# Patient Record
Sex: Female | Born: 1970
Health system: Southern US, Community
[De-identification: ages and names within clinical notes are randomized; demographics above are authoritative.]

## PROBLEM LIST (undated history)

## (undated) DIAGNOSIS — K219 Gastro-esophageal reflux disease without esophagitis: Secondary | ICD-10-CM

## (undated) DIAGNOSIS — J45909 Unspecified asthma, uncomplicated: Secondary | ICD-10-CM

## (undated) DIAGNOSIS — F419 Anxiety disorder, unspecified: Secondary | ICD-10-CM

## (undated) DIAGNOSIS — R011 Cardiac murmur, unspecified: Secondary | ICD-10-CM

## (undated) DIAGNOSIS — F319 Bipolar disorder, unspecified: Secondary | ICD-10-CM

## (undated) DIAGNOSIS — D649 Anemia, unspecified: Secondary | ICD-10-CM

## (undated) DIAGNOSIS — F32A Depression, unspecified: Secondary | ICD-10-CM

## (undated) DIAGNOSIS — K449 Diaphragmatic hernia without obstruction or gangrene: Secondary | ICD-10-CM

## (undated) DIAGNOSIS — K649 Unspecified hemorrhoids: Secondary | ICD-10-CM

## (undated) DIAGNOSIS — K648 Other hemorrhoids: Secondary | ICD-10-CM

## (undated) DIAGNOSIS — S99929A Unspecified injury of unspecified foot, initial encounter: Secondary | ICD-10-CM

## (undated) DIAGNOSIS — F329 Major depressive disorder, single episode, unspecified: Secondary | ICD-10-CM

## (undated) DIAGNOSIS — I10 Essential (primary) hypertension: Secondary | ICD-10-CM

## (undated) DIAGNOSIS — G4719 Other hypersomnia: Secondary | ICD-10-CM

## (undated) HISTORY — DX: Major depressive disorder, single episode, unspecified: F32.9

## (undated) HISTORY — DX: Diaphragmatic hernia without obstruction or gangrene: K44.9

## (undated) HISTORY — DX: Other hypersomnia: G47.19

## (undated) HISTORY — PX: INGUINAL HERNIA REPAIR: SUR1180

## (undated) HISTORY — DX: Anxiety disorder, unspecified: F41.9

## (undated) HISTORY — DX: Gastro-esophageal reflux disease without esophagitis: K21.9

## (undated) HISTORY — DX: Unspecified injury of unspecified foot, initial encounter: S99.929A

## (undated) HISTORY — DX: Other hemorrhoids: K64.8

## (undated) HISTORY — PX: PARTIAL HYSTERECTOMY: SHX80

## (undated) HISTORY — DX: Unspecified hemorrhoids: K64.9

## (undated) HISTORY — PX: HEMORRHOID SURGERY: SHX153

## (undated) HISTORY — DX: Unspecified asthma, uncomplicated: J45.909

## (undated) HISTORY — DX: Essential (primary) hypertension: I10

## (undated) HISTORY — PX: UPPER GASTROINTESTINAL ENDOSCOPY: SHX188

## (undated) HISTORY — PX: ADENOIDECTOMY: SUR15

## (undated) HISTORY — PX: TONSILLECTOMY: SUR1361

## (undated) HISTORY — PX: TUBAL LIGATION: SHX77

## (undated) HISTORY — DX: Bipolar disorder, unspecified: F31.9

## (undated) HISTORY — DX: Depression, unspecified: F32.A

## (undated) HISTORY — PX: ABDOMINAL HYSTERECTOMY: SHX81

---

## 1998-06-06 ENCOUNTER — Emergency Department (HOSPITAL_COMMUNITY): Admission: EM | Admit: 1998-06-06 | Discharge: 1998-06-06 | Payer: Self-pay | Admitting: Emergency Medicine

## 1998-09-19 ENCOUNTER — Encounter: Admission: RE | Admit: 1998-09-19 | Discharge: 1998-09-19 | Payer: Self-pay | Admitting: Internal Medicine

## 1998-10-12 ENCOUNTER — Encounter: Admission: RE | Admit: 1998-10-12 | Discharge: 1998-10-12 | Payer: Self-pay | Admitting: Obstetrics

## 1998-10-26 ENCOUNTER — Ambulatory Visit (HOSPITAL_COMMUNITY): Admission: RE | Admit: 1998-10-26 | Discharge: 1998-10-26 | Payer: Self-pay | Admitting: Obstetrics

## 1998-10-26 ENCOUNTER — Encounter: Admission: RE | Admit: 1998-10-26 | Discharge: 1998-10-26 | Payer: Self-pay | Admitting: Obstetrics

## 1998-12-28 ENCOUNTER — Encounter: Admission: RE | Admit: 1998-12-28 | Discharge: 1998-12-28 | Payer: Self-pay | Admitting: Obstetrics

## 1999-01-19 ENCOUNTER — Encounter: Admission: RE | Admit: 1999-01-19 | Discharge: 1999-01-19 | Payer: Self-pay | Admitting: Obstetrics & Gynecology

## 1999-03-02 ENCOUNTER — Encounter: Admission: RE | Admit: 1999-03-02 | Discharge: 1999-03-02 | Payer: Self-pay | Admitting: Obstetrics

## 1999-03-05 ENCOUNTER — Inpatient Hospital Stay (HOSPITAL_COMMUNITY): Admission: AD | Admit: 1999-03-05 | Discharge: 1999-03-05 | Payer: Self-pay | Admitting: Obstetrics & Gynecology

## 1999-04-05 ENCOUNTER — Inpatient Hospital Stay (HOSPITAL_COMMUNITY): Admission: RE | Admit: 1999-04-05 | Discharge: 1999-04-05 | Payer: Self-pay | Admitting: *Deleted

## 1999-05-15 ENCOUNTER — Encounter: Admission: RE | Admit: 1999-05-15 | Discharge: 1999-05-15 | Payer: Self-pay | Admitting: Obstetrics & Gynecology

## 1999-07-03 ENCOUNTER — Encounter: Admission: RE | Admit: 1999-07-03 | Discharge: 1999-07-03 | Payer: Self-pay | Admitting: Obstetrics & Gynecology

## 1999-09-05 ENCOUNTER — Encounter: Admission: RE | Admit: 1999-09-05 | Discharge: 1999-09-05 | Payer: Self-pay | Admitting: Hematology and Oncology

## 1999-09-05 ENCOUNTER — Encounter: Payer: Self-pay | Admitting: Internal Medicine

## 1999-09-05 ENCOUNTER — Ambulatory Visit (HOSPITAL_COMMUNITY): Admission: RE | Admit: 1999-09-05 | Discharge: 1999-09-05 | Payer: Self-pay | Admitting: Internal Medicine

## 1999-10-31 ENCOUNTER — Emergency Department (HOSPITAL_COMMUNITY): Admission: EM | Admit: 1999-10-31 | Discharge: 1999-10-31 | Payer: Self-pay | Admitting: Emergency Medicine

## 1999-11-29 ENCOUNTER — Encounter: Admission: RE | Admit: 1999-11-29 | Discharge: 1999-11-29 | Payer: Self-pay | Admitting: Obstetrics

## 2000-01-03 ENCOUNTER — Encounter: Admission: RE | Admit: 2000-01-03 | Discharge: 2000-01-03 | Payer: Self-pay | Admitting: Obstetrics

## 2000-01-17 ENCOUNTER — Encounter: Admission: RE | Admit: 2000-01-17 | Discharge: 2000-01-17 | Payer: Self-pay | Admitting: Obstetrics

## 2000-03-31 ENCOUNTER — Emergency Department (HOSPITAL_COMMUNITY): Admission: EM | Admit: 2000-03-31 | Discharge: 2000-03-31 | Payer: Self-pay | Admitting: Emergency Medicine

## 2000-05-15 ENCOUNTER — Encounter: Admission: RE | Admit: 2000-05-15 | Discharge: 2000-05-15 | Payer: Self-pay | Admitting: Internal Medicine

## 2000-07-01 ENCOUNTER — Encounter: Admission: RE | Admit: 2000-07-01 | Discharge: 2000-07-01 | Payer: Self-pay | Admitting: Internal Medicine

## 2000-08-22 ENCOUNTER — Encounter: Admission: RE | Admit: 2000-08-22 | Discharge: 2000-08-22 | Payer: Self-pay | Admitting: Internal Medicine

## 2000-08-29 ENCOUNTER — Encounter: Admission: RE | Admit: 2000-08-29 | Discharge: 2000-08-29 | Payer: Self-pay | Admitting: Internal Medicine

## 2000-11-19 ENCOUNTER — Inpatient Hospital Stay (HOSPITAL_COMMUNITY): Admission: AD | Admit: 2000-11-19 | Discharge: 2000-11-19 | Payer: Self-pay | Admitting: Obstetrics

## 2000-11-19 ENCOUNTER — Encounter: Payer: Self-pay | Admitting: Obstetrics

## 2000-12-11 ENCOUNTER — Encounter: Admission: RE | Admit: 2000-12-11 | Discharge: 2000-12-11 | Payer: Self-pay | Admitting: Obstetrics

## 2000-12-17 ENCOUNTER — Encounter: Admission: RE | Admit: 2000-12-17 | Discharge: 2000-12-17 | Payer: Self-pay | Admitting: Obstetrics & Gynecology

## 2000-12-24 ENCOUNTER — Encounter: Admission: RE | Admit: 2000-12-24 | Discharge: 2000-12-24 | Payer: Self-pay | Admitting: Obstetrics & Gynecology

## 2000-12-28 ENCOUNTER — Inpatient Hospital Stay (HOSPITAL_COMMUNITY): Admission: AD | Admit: 2000-12-28 | Discharge: 2000-12-28 | Payer: Self-pay | Admitting: Obstetrics & Gynecology

## 2000-12-31 ENCOUNTER — Encounter: Payer: Self-pay | Admitting: *Deleted

## 2000-12-31 ENCOUNTER — Encounter: Admission: RE | Admit: 2000-12-31 | Discharge: 2000-12-31 | Payer: Self-pay | Admitting: Obstetrics & Gynecology

## 2000-12-31 ENCOUNTER — Inpatient Hospital Stay (HOSPITAL_COMMUNITY): Admission: AD | Admit: 2000-12-31 | Discharge: 2000-12-31 | Payer: Self-pay | Admitting: *Deleted

## 2001-01-05 ENCOUNTER — Inpatient Hospital Stay (HOSPITAL_COMMUNITY): Admission: AD | Admit: 2001-01-05 | Discharge: 2001-01-05 | Payer: Self-pay | Admitting: Obstetrics

## 2001-01-07 ENCOUNTER — Encounter: Admission: RE | Admit: 2001-01-07 | Discharge: 2001-01-07 | Payer: Self-pay | Admitting: Obstetrics & Gynecology

## 2001-01-11 ENCOUNTER — Inpatient Hospital Stay (HOSPITAL_COMMUNITY): Admission: AD | Admit: 2001-01-11 | Discharge: 2001-01-11 | Payer: Self-pay | Admitting: Obstetrics

## 2001-01-14 ENCOUNTER — Encounter: Admission: RE | Admit: 2001-01-14 | Discharge: 2001-01-14 | Payer: Self-pay | Admitting: Obstetrics & Gynecology

## 2001-01-21 ENCOUNTER — Inpatient Hospital Stay (HOSPITAL_COMMUNITY): Admission: AD | Admit: 2001-01-21 | Discharge: 2001-01-21 | Payer: Self-pay | Admitting: *Deleted

## 2001-01-28 ENCOUNTER — Encounter: Admission: RE | Admit: 2001-01-28 | Discharge: 2001-01-28 | Payer: Self-pay | Admitting: Obstetrics & Gynecology

## 2001-02-04 ENCOUNTER — Encounter: Admission: RE | Admit: 2001-02-04 | Discharge: 2001-02-04 | Payer: Self-pay | Admitting: Obstetrics & Gynecology

## 2001-02-11 ENCOUNTER — Ambulatory Visit (HOSPITAL_COMMUNITY): Admission: RE | Admit: 2001-02-11 | Discharge: 2001-02-11 | Payer: Self-pay | Admitting: Obstetrics & Gynecology

## 2001-02-11 ENCOUNTER — Encounter: Payer: Self-pay | Admitting: Obstetrics & Gynecology

## 2001-02-11 ENCOUNTER — Encounter: Admission: RE | Admit: 2001-02-11 | Discharge: 2001-02-11 | Payer: Self-pay | Admitting: Obstetrics & Gynecology

## 2001-02-18 ENCOUNTER — Encounter: Admission: RE | Admit: 2001-02-18 | Discharge: 2001-02-18 | Payer: Self-pay | Admitting: Obstetrics & Gynecology

## 2001-02-25 ENCOUNTER — Encounter: Admission: RE | Admit: 2001-02-25 | Discharge: 2001-02-25 | Payer: Self-pay | Admitting: Obstetrics & Gynecology

## 2001-03-12 ENCOUNTER — Inpatient Hospital Stay (HOSPITAL_COMMUNITY): Admission: AD | Admit: 2001-03-12 | Discharge: 2001-03-12 | Payer: Self-pay | Admitting: Obstetrics & Gynecology

## 2001-03-12 ENCOUNTER — Encounter: Admission: RE | Admit: 2001-03-12 | Discharge: 2001-03-12 | Payer: Self-pay | Admitting: Obstetrics

## 2001-03-17 ENCOUNTER — Inpatient Hospital Stay (HOSPITAL_COMMUNITY): Admission: AD | Admit: 2001-03-17 | Discharge: 2001-03-17 | Payer: Self-pay | Admitting: Obstetrics

## 2001-03-24 ENCOUNTER — Ambulatory Visit (HOSPITAL_COMMUNITY): Admission: RE | Admit: 2001-03-24 | Discharge: 2001-03-24 | Payer: Self-pay | Admitting: Obstetrics & Gynecology

## 2001-03-25 ENCOUNTER — Encounter: Admission: RE | Admit: 2001-03-25 | Discharge: 2001-03-25 | Payer: Self-pay | Admitting: Obstetrics & Gynecology

## 2001-03-28 ENCOUNTER — Encounter: Payer: Self-pay | Admitting: *Deleted

## 2001-03-28 ENCOUNTER — Observation Stay (HOSPITAL_COMMUNITY): Admission: AD | Admit: 2001-03-28 | Discharge: 2001-03-29 | Payer: Self-pay | Admitting: *Deleted

## 2001-03-31 ENCOUNTER — Encounter: Payer: Self-pay | Admitting: *Deleted

## 2001-03-31 ENCOUNTER — Inpatient Hospital Stay (HOSPITAL_COMMUNITY): Admission: AD | Admit: 2001-03-31 | Discharge: 2001-04-02 | Payer: Self-pay | Admitting: *Deleted

## 2001-04-01 ENCOUNTER — Encounter: Admission: RE | Admit: 2001-04-01 | Discharge: 2001-04-01 | Payer: Self-pay | Admitting: Obstetrics & Gynecology

## 2001-04-08 ENCOUNTER — Encounter: Admission: RE | Admit: 2001-04-08 | Discharge: 2001-04-08 | Payer: Self-pay | Admitting: Obstetrics & Gynecology

## 2001-04-15 ENCOUNTER — Encounter: Admission: RE | Admit: 2001-04-15 | Discharge: 2001-04-15 | Payer: Self-pay | Admitting: Obstetrics & Gynecology

## 2001-04-15 ENCOUNTER — Ambulatory Visit (HOSPITAL_COMMUNITY): Admission: RE | Admit: 2001-04-15 | Discharge: 2001-04-15 | Payer: Self-pay | Admitting: *Deleted

## 2001-04-22 ENCOUNTER — Encounter: Admission: RE | Admit: 2001-04-22 | Discharge: 2001-04-22 | Payer: Self-pay | Admitting: Obstetrics & Gynecology

## 2001-04-30 ENCOUNTER — Inpatient Hospital Stay (HOSPITAL_COMMUNITY): Admission: AD | Admit: 2001-04-30 | Discharge: 2001-04-30 | Payer: Self-pay | Admitting: Obstetrics & Gynecology

## 2001-05-05 ENCOUNTER — Ambulatory Visit (HOSPITAL_COMMUNITY): Admission: RE | Admit: 2001-05-05 | Discharge: 2001-05-05 | Payer: Self-pay | Admitting: Obstetrics

## 2001-05-06 ENCOUNTER — Encounter: Admission: RE | Admit: 2001-05-06 | Discharge: 2001-05-06 | Payer: Self-pay | Admitting: Obstetrics & Gynecology

## 2001-05-13 ENCOUNTER — Inpatient Hospital Stay (HOSPITAL_COMMUNITY): Admission: AD | Admit: 2001-05-13 | Discharge: 2001-05-13 | Payer: Self-pay | Admitting: Obstetrics

## 2001-05-20 ENCOUNTER — Encounter: Admission: RE | Admit: 2001-05-20 | Discharge: 2001-05-20 | Payer: Self-pay | Admitting: Obstetrics & Gynecology

## 2001-06-02 ENCOUNTER — Encounter: Payer: Self-pay | Admitting: *Deleted

## 2001-06-02 ENCOUNTER — Observation Stay (HOSPITAL_COMMUNITY): Admission: AD | Admit: 2001-06-02 | Discharge: 2001-06-03 | Payer: Self-pay | Admitting: Obstetrics & Gynecology

## 2001-06-10 ENCOUNTER — Encounter: Admission: RE | Admit: 2001-06-10 | Discharge: 2001-06-10 | Payer: Self-pay | Admitting: Obstetrics & Gynecology

## 2001-06-11 ENCOUNTER — Inpatient Hospital Stay (HOSPITAL_COMMUNITY): Admission: AD | Admit: 2001-06-11 | Discharge: 2001-06-11 | Payer: Self-pay | Admitting: Obstetrics & Gynecology

## 2001-06-13 ENCOUNTER — Inpatient Hospital Stay (HOSPITAL_COMMUNITY): Admission: AD | Admit: 2001-06-13 | Discharge: 2001-06-16 | Payer: Self-pay | Admitting: *Deleted

## 2001-06-13 ENCOUNTER — Encounter (INDEPENDENT_AMBULATORY_CARE_PROVIDER_SITE_OTHER): Payer: Self-pay | Admitting: Specialist

## 2001-06-30 ENCOUNTER — Inpatient Hospital Stay (HOSPITAL_COMMUNITY): Admission: AD | Admit: 2001-06-30 | Discharge: 2001-06-30 | Payer: Self-pay | Admitting: *Deleted

## 2002-01-20 ENCOUNTER — Encounter (HOSPITAL_BASED_OUTPATIENT_CLINIC_OR_DEPARTMENT_OTHER): Payer: Self-pay | Admitting: General Surgery

## 2002-01-22 ENCOUNTER — Ambulatory Visit (HOSPITAL_COMMUNITY): Admission: RE | Admit: 2002-01-22 | Discharge: 2002-01-22 | Payer: Self-pay | Admitting: General Surgery

## 2002-01-22 ENCOUNTER — Encounter (INDEPENDENT_AMBULATORY_CARE_PROVIDER_SITE_OTHER): Payer: Self-pay | Admitting: Specialist

## 2002-05-26 ENCOUNTER — Encounter: Admission: RE | Admit: 2002-05-26 | Discharge: 2002-05-26 | Payer: Self-pay | Admitting: Internal Medicine

## 2002-06-20 ENCOUNTER — Emergency Department (HOSPITAL_COMMUNITY): Admission: EM | Admit: 2002-06-20 | Discharge: 2002-06-20 | Payer: Self-pay | Admitting: Emergency Medicine

## 2002-06-20 ENCOUNTER — Encounter: Payer: Self-pay | Admitting: Emergency Medicine

## 2002-08-05 ENCOUNTER — Inpatient Hospital Stay (HOSPITAL_COMMUNITY): Admission: AD | Admit: 2002-08-05 | Discharge: 2002-08-05 | Payer: Self-pay | Admitting: *Deleted

## 2002-12-14 ENCOUNTER — Encounter: Admission: RE | Admit: 2002-12-14 | Discharge: 2002-12-14 | Payer: Self-pay | Admitting: Internal Medicine

## 2002-12-14 ENCOUNTER — Ambulatory Visit (HOSPITAL_COMMUNITY): Admission: RE | Admit: 2002-12-14 | Discharge: 2002-12-14 | Payer: Self-pay | Admitting: Internal Medicine

## 2002-12-20 ENCOUNTER — Encounter: Admission: RE | Admit: 2002-12-20 | Discharge: 2002-12-20 | Payer: Self-pay | Admitting: Internal Medicine

## 2002-12-21 ENCOUNTER — Other Ambulatory Visit: Admission: RE | Admit: 2002-12-21 | Discharge: 2002-12-21 | Payer: Self-pay | Admitting: Obstetrics and Gynecology

## 2003-01-04 ENCOUNTER — Encounter (INDEPENDENT_AMBULATORY_CARE_PROVIDER_SITE_OTHER): Payer: Self-pay

## 2003-01-04 ENCOUNTER — Observation Stay (HOSPITAL_COMMUNITY): Admission: RE | Admit: 2003-01-04 | Discharge: 2003-01-05 | Payer: Self-pay | Admitting: Obstetrics and Gynecology

## 2003-01-14 ENCOUNTER — Inpatient Hospital Stay (HOSPITAL_COMMUNITY): Admission: AD | Admit: 2003-01-14 | Discharge: 2003-01-21 | Payer: Self-pay | Admitting: Obstetrics and Gynecology

## 2003-01-15 ENCOUNTER — Encounter: Payer: Self-pay | Admitting: Obstetrics and Gynecology

## 2003-01-16 ENCOUNTER — Encounter: Payer: Self-pay | Admitting: Urology

## 2003-04-05 ENCOUNTER — Encounter: Payer: Self-pay | Admitting: Internal Medicine

## 2003-04-05 ENCOUNTER — Encounter: Admission: RE | Admit: 2003-04-05 | Discharge: 2003-04-05 | Payer: Self-pay | Admitting: Internal Medicine

## 2003-04-05 ENCOUNTER — Ambulatory Visit (HOSPITAL_COMMUNITY): Admission: RE | Admit: 2003-04-05 | Discharge: 2003-04-05 | Payer: Self-pay | Admitting: Internal Medicine

## 2003-04-08 ENCOUNTER — Encounter: Admission: RE | Admit: 2003-04-08 | Discharge: 2003-04-08 | Payer: Self-pay | Admitting: Internal Medicine

## 2003-04-11 ENCOUNTER — Encounter: Admission: RE | Admit: 2003-04-11 | Discharge: 2003-04-11 | Payer: Self-pay | Admitting: Internal Medicine

## 2003-10-20 ENCOUNTER — Encounter: Admission: RE | Admit: 2003-10-20 | Discharge: 2003-10-20 | Payer: Self-pay | Admitting: Internal Medicine

## 2003-12-28 ENCOUNTER — Encounter: Admission: RE | Admit: 2003-12-28 | Discharge: 2003-12-28 | Payer: Self-pay | Admitting: Internal Medicine

## 2004-02-02 ENCOUNTER — Emergency Department (HOSPITAL_COMMUNITY): Admission: EM | Admit: 2004-02-02 | Discharge: 2004-02-02 | Payer: Self-pay | Admitting: Emergency Medicine

## 2004-02-08 ENCOUNTER — Encounter: Admission: RE | Admit: 2004-02-08 | Discharge: 2004-02-08 | Payer: Self-pay | Admitting: Internal Medicine

## 2004-02-15 ENCOUNTER — Encounter: Admission: RE | Admit: 2004-02-15 | Discharge: 2004-02-15 | Payer: Self-pay | Admitting: Internal Medicine

## 2004-02-21 ENCOUNTER — Encounter: Admission: RE | Admit: 2004-02-21 | Discharge: 2004-02-21 | Payer: Self-pay | Admitting: Internal Medicine

## 2004-02-27 ENCOUNTER — Emergency Department (HOSPITAL_COMMUNITY): Admission: AD | Admit: 2004-02-27 | Discharge: 2004-02-27 | Payer: Self-pay | Admitting: Family Medicine

## 2005-01-25 ENCOUNTER — Emergency Department (HOSPITAL_COMMUNITY): Admission: EM | Admit: 2005-01-25 | Discharge: 2005-01-25 | Payer: Self-pay | Admitting: Emergency Medicine

## 2005-10-15 ENCOUNTER — Emergency Department (HOSPITAL_COMMUNITY): Admission: EM | Admit: 2005-10-15 | Discharge: 2005-10-15 | Payer: Self-pay | Admitting: Emergency Medicine

## 2006-03-29 ENCOUNTER — Emergency Department (HOSPITAL_COMMUNITY): Admission: EM | Admit: 2006-03-29 | Discharge: 2006-03-29 | Payer: Self-pay | Admitting: Emergency Medicine

## 2006-08-31 ENCOUNTER — Emergency Department (HOSPITAL_COMMUNITY): Admission: EM | Admit: 2006-08-31 | Discharge: 2006-09-01 | Payer: Self-pay | Admitting: Emergency Medicine

## 2006-12-30 ENCOUNTER — Emergency Department (HOSPITAL_COMMUNITY): Admission: EM | Admit: 2006-12-30 | Discharge: 2006-12-30 | Payer: Self-pay | Admitting: Emergency Medicine

## 2007-03-14 ENCOUNTER — Emergency Department (HOSPITAL_COMMUNITY): Admission: EM | Admit: 2007-03-14 | Discharge: 2007-03-14 | Payer: Self-pay | Admitting: Emergency Medicine

## 2007-05-20 ENCOUNTER — Emergency Department (HOSPITAL_COMMUNITY): Admission: EM | Admit: 2007-05-20 | Discharge: 2007-05-20 | Payer: Self-pay | Admitting: Emergency Medicine

## 2007-08-22 ENCOUNTER — Emergency Department (HOSPITAL_COMMUNITY): Admission: EM | Admit: 2007-08-22 | Discharge: 2007-08-22 | Payer: Self-pay | Admitting: Emergency Medicine

## 2008-04-02 ENCOUNTER — Emergency Department (HOSPITAL_COMMUNITY): Admission: EM | Admit: 2008-04-02 | Discharge: 2008-04-02 | Payer: Self-pay | Admitting: Emergency Medicine

## 2008-04-18 ENCOUNTER — Ambulatory Visit: Payer: Self-pay | Admitting: Gastroenterology

## 2008-04-25 ENCOUNTER — Ambulatory Visit: Payer: Self-pay | Admitting: Gastroenterology

## 2008-04-25 ENCOUNTER — Encounter: Payer: Self-pay | Admitting: Gastroenterology

## 2008-04-27 ENCOUNTER — Encounter: Payer: Self-pay | Admitting: Gastroenterology

## 2009-09-17 ENCOUNTER — Emergency Department (HOSPITAL_COMMUNITY): Admission: EM | Admit: 2009-09-17 | Discharge: 2009-09-17 | Payer: Self-pay | Admitting: Emergency Medicine

## 2011-03-12 ENCOUNTER — Other Ambulatory Visit: Payer: Self-pay | Admitting: Oncology

## 2011-03-12 DIAGNOSIS — N6320 Unspecified lump in the left breast, unspecified quadrant: Secondary | ICD-10-CM

## 2011-03-15 ENCOUNTER — Ambulatory Visit
Admission: RE | Admit: 2011-03-15 | Discharge: 2011-03-15 | Disposition: A | Discharge status: Home / Self Care | Payer: Medicare Other | Source: Ambulatory Visit | Attending: Oncology | Admitting: Oncology

## 2011-03-15 ENCOUNTER — Other Ambulatory Visit: Payer: Self-pay | Admitting: Oncology

## 2011-03-15 DIAGNOSIS — N6320 Unspecified lump in the left breast, unspecified quadrant: Secondary | ICD-10-CM

## 2011-05-07 NOTE — Assessment & Plan Note (Signed)
[facility]                         GASTROENTEROLOGY OFFICE NOTE   CADE, OLBERDING                      MRN:          [id number]  DATE:04/18/2008                            DOB:          1971/06/21    REFERRED BY:  Sedonia Small NP.   REASON FOR CONSULTATION:  Nausea, chest discomfort, left lower quadrant  pain, constipation, and hematochezia.   HISTORY OF PRESENT ILLNESS:  Ms. Parlow is a 40 year old African-  American female that I previously evaluated in 2003.  She has a prior  history of a hemorrhoidectomy and was having lower abdominal pain,  rectal pain, and hematochezia.  She underwent colonoscopy in May 2003  which showed no abnormalities except some mild scar tissue in the distal  rectum consistent with her history of hemorrhoidectomy.  Over the past  several months she notes frequent nausea associated with substernal  burning, belching, and chest pressure.  She has had worsening problem  with constipation over the past few months associated with left lower  quadrant pain that radiates toward her back and intermittent small  volume hematochezia associated with bowel movements.  Her bleeding was  described as moderate for about four days, and then it discontinued.  She relates no change in stool caliber, weight loss, fevers, chills or  vomiting.   FAMILY HISTORY:  Her family history is negative for colon cancer, colon  polyps or inflammatory bowel disease.   PAST MEDICAL HISTORY:  Asthma  bsessive compulsive disorder  panic attacks  anxiety disorder  depression  uterine fibroids  hemorrhoids   PAST SURGICAL HISTORY:  Status post tonsillectomy  status post inguinal hernia repair at age 37  status post hemorrhoidectomy 2002  status post bilateral tubal ligation 2002   CURRENT MEDICATIONS:  listed on the chart; updated and reviewed.   MEDICATION ALLERGIES:  MORPHINE leading to itching.   Social History and Review of Systems  per the handwritten form.   PHYSICAL EXAMINATION:  GENERAL:  Anxious overweight female in no acute  distress, height 5 feet 5 inches, weight 197.6 pounds, blood pressure  106/72, pulse 72 and regular.  HEENT:  Anicteric sclerae, oropharynx clear.  CHEST:  Clear to auscultation bilaterally.  CARDIAC:  Regular rate and rhythm without murmurs appreciated.  ABDOMEN:  Soft with minimal left lower quadrant tenderness to deep  palpation, no distention, normoactive bowel sounds, no rebound or  guarding, no palpable organomegaly, masses or hernias.  RECTAL:  Deferred.  Recent exam performed by Sedonia Small NP showing  no lesions and Hemoccult negative stool.  EXTREMITIES:  Without clubbing, cyanosis or edema.  NEUROLOGIC:  Alert and oriented x4.   ASSESSMENT/PLAN:  1. Constipation, left lower quadrant pain, and small volume      hematochezia.  I suspect her symptoms are hemorrhoidal.  Need to      rule out colorectal neoplasms, proctitis, and other disorders.  She      is advised to begin a high fiber diet with a daily fiber supplement      and to substantially increase her fluid intake.  She may use Colace  on a daily basis or MiraLax up to three times a day as needed for      management of constipation.  Risks, benefits, and alternatives to      colonoscopy and possible biopsy and possible polypectomy discussed      with the patient, and she consents to proceed.  This will be      scheduled electively.  2. Presumed gastroesophageal reflux disease.  Begin standard      antireflux measures and omeprazole 20 mg p.o. q.a.m.     [name]. Russella Dar, MD, [facility]  Electronically Signed    MTS/MedQ  DD: 04/18/2008  DT: 04/18/2008  Job #: [id number]   cc:   Sedonia Small, NP

## 2011-05-10 NOTE — Op Note (Signed)
NAME:  Jasmine Jackson, Jasmine Jackson                         ACCOUNT NO.:  [id number]   MEDICAL RECORD NO.:  [id number]                   PATIENT TYPE:  OUT   LOCATION:  CATS                                 FACILITY:  [facility]   PHYSICIAN:  Janine Limbo, M.D.            DATE OF BIRTH:  02-Apr-1971   DATE OF PROCEDURE:  01/18/2003  DATE OF DISCHARGE:  01/16/2003                                 OPERATIVE REPORT   PREOPERATIVE DIAGNOSES:  1. Pelvic abscess.  2. Status post laparoscopy-assisted vaginal hysterectomy on January 04, 2003.   POSTOPERATIVE DIAGNOSES:  1. Pelvic abscess.  2. Status post laparoscopy-assisted vaginal hysterectomy on January 04, 2003.   PROCEDURE:  Vaginal drainage of pelvic abscess.   SURGEON:  Janine Limbo, M.D.   ASSISTANT:  Osborn Coho, M.D.   ANESTHESIA:  General.   INDICATIONS:  The patient is a 40 year old female who underwent a  laparoscopically-assisted vaginal hysterectomy on January 04, 2003.  The  patient presented to the hospital on January 14, 2003, complaining of  abdominal pain, and she was noted to have a temperature at that time.  A CT  scan confirmed the presence of a pelvic abscess.  The patient was started on  antibiotics, but she continued to have temperatures as high as 102.  Her  white blood cell count was elevated.  The decision was made to present to  the operating room today to drain the abscess.  She understands that one  option is to perform an exploratory laparotomy and drained the abscess  abdominally.  She also understands that there is potential benefit to  draining the abscess through the vagina, and therefore we will try the  vaginal approach initially.  She understands the indications for this  procedure, and she accepts the associated risks.   FINDINGS:  The vaginal cuff was opened, and a total of 50 mL of purulent  material was drained from the pelvis.   DESCRIPTION OF PROCEDURE:  The patient was taken to  the operating room where  a general anesthetic was given.  The perineum and vagina were prepped with  multiple layers of Betadine and then sterilely draped.  A Foley catheter was  placed in the bladder.  The vaginal cuff was opened and 50 mL of purulent  material was drained.  The pelvis was then irrigated using copious amounts  of irrigation fluid.  The patient has an abdominal drain that was placed by  radiology and irrigation fluid was noted to drain through the abdominal  drain.  We felt comfortable that we were in the proper space and that the  abscess was drained.  The cuff was then sutured using a running pursestring  suture of 0 Vicryl.  Hemostasis was adequate.  A 2 inch gauze was placed in  the abscess cavity and brought out  through the vagina.  The vagina  was packed as well.  The patient was noted  to drain clear yellow urine at the end of her procedure.  The estimated  blood loss was 50 mL.  The patient was taken to the recovery room in stable  condition.                                               Janine Limbo, M.D.    AVS/MEDQ  D:  01/18/2003  T:  01/18/2003  Job:  [id number]

## 2011-05-10 NOTE — Consult Note (Signed)
NAME:  Jasmine Jackson, Jasmine Jackson                         ACCOUNT NO.:  [id number]   MEDICAL RECORD NO.:  [id number]                   PATIENT TYPE:  OUT   LOCATION:  CATS                                 FACILITY:  [facility]   PHYSICIAN:  Valetta Fuller, M.D.               DATE OF BIRTH:  1971-08-02   DATE OF CONSULTATION:  01/16/2003  DATE OF DISCHARGE:                                   CONSULTATION   REASON FOR CONSULTATION:  Left hydronephrosis, pelvic mass, fever, and  chills.   HISTORY OF PRESENT ILLNESS:  Jasmine Jackson is a 40 year old female. It appears that  approximately two weeks ago, she underwent a laparoscopically assisted  vaginal hysterectomy. Apparently, the surgery was uncomplicated per  conversation with Jasmine Jackson. The patient apparently was doing reasonably  well and had no obvious complaints through her initial postoperative period.  The patient subsequently began having some increased pelvic discomfort. She  also began having some fever and chills. She reported some increased vaginal  bleeding and abdominal pain and was seen at [facility]. Prior to that,  she had complained of some mild voiding symptoms and had been placed on some  Macrobid for urinary tract infection. She had a little bit of burning with  urination but no other voiding symptoms and no gross hematuria. She was  admitted with a fever and some lower abdominal tenderness. A CT scan was  done, which was read as showing left hydronephrosis with a pelvic fluid  collection. I had initial conversation with Jasmine Jackson, who was covering  the patient and it appeared that there was certainly a possibility of  ureteral injury with possible urinoma. We had suggested that a percutaneous  nephroscopy would probably be necessary. We even discussed things with one  of the interventional radiologist but had not yet seen the patient or the  films. Subsequently, I have come in and examined the patient and the CT. The  patient  has had absolutely no flank pain and has had normal renal function.  On her CT scan, she does have some dilation of the left renal pelvis but  there is contrast fairly quickly in the collection system, indicating really  no evidence of high grade obstruction whatsoever. There is a large loculated  pelvic fluid collection consistent with probable infected hematoma/pelvic  abscess.   PAST MEDICAL HISTORY:  Significant for bipolar disease. She otherwise, does  not have major medical illnesses.   MEDICATIONS:  Include Xanax, Prozac, BuSpar.   ALLERGIES:  MORPHINE (itching).   SOCIAL HISTORY:  She does smoke one half pack of cigarettes per day.   PHYSICAL EXAMINATION:  GENERAL: A well developed, well nourished  female.  She is alert this morning and in no obvious acute distress. She does not  appear toxic.  VITAL SIGNS: She continued to have a fever and has had a temperature as high  at 103. She is 101  this morning. Her vital signs are stable with the  exception of a sinus tachycardia. Urine output appears to be quite good.  BACK: No cardiovascular tenderness.  ABDOMEN: She does have some induration of her lower abdominal wall with  definite tenderness and a fullness within the suprapubic region.   ASSESSMENT:  Pelvic fluid collection. This appears to be infected and it is  loculated. It is not clear how effective percutaneous drainage should be but  that it is certainly a reasonable first attempt. Whether this is infected  seroma, hematoma, or urinoma is unclear. I doubt that there is a ureteral  injury. She has had no flank pain and her renal function is normal. The  hydronephrosis she has is moderate but there is clearly a contrast seen in  the collecting system quite promptly, indicating very little obstruction at  this time. I would expect there to be nearly complete obstruction if there  was a significant ureteral injury at this point and therefore, I think that  is quite  unlikely. I suspect her hydronephrosis is probably due to some  extrinsic compression of the ureter from this pelvic fluid collection.   PLAN:  Have her drain percutaneously today and also to get some repeat  delayed images to be absolutely certain that the ureter is okay.                                               Valetta Fuller, M.D.    DSG/MEDQ  D:  01/16/2003  T:  01/17/2003  Job:  [id number]   cc:   Jasmine Jackson, M.D.  [address]  [place]  [place] [address]  Fax: [phone]

## 2011-05-10 NOTE — Op Note (Signed)
. [facility]  Patient:    Jasmine Jackson, BRONER Visit Number: [id number] MRN: [id number]          Service Type: DSU Location: RCRM 2550 06 Attending Physician:  Sonda Primes Dictated by:   Mardene Celeste. Lurene Shadow, M.D. Proc. Date: 01/22/02 Admit Date:  01/22/2002 Discharge Date: 01/22/2002                             Operative Report  PREOPERATIVE DIAGNOSIS:  Bleeding hemorrhoidal disease.  POSTOPERATIVE DIAGNOSIS:  Bleeding hemorrhoidal disease.  PROCEDURES: 1. Examination under anesthesia. 2. Proctosigmoidoscopy to 25 cm. 3. Hemorrhoidectomy.  SURGEON:  Mardene Celeste. Lurene Shadow, M.D.  ASSISTANT:  Nurse.  ANESTHESIA:  General.  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  She presents now for hemorrhoidectomy after the risks and potential benefits of hemorrhoidal surgery have been fully discussed with her.  She gives consent.  DESCRIPTION OF PROCEDURE:  Following the induction of satisfactory anesthesia, the patient is positioned in the prone jackknife position.  The perianal tissues were then inspected.  She has rather large external and internal hemorrhoids.  The protosigmoidoscope is passed up to 25 cm with no other areas that are suspicious for bleeding or other mucosal lesions.  The perianal tissues are then prepped and draped to be included in the sterile operative field.  I infiltrated the perianal tissues with 0.5% Marcaine with epinephrine 1:200,000.  I then inserted the operating scope and then in the posterior midline position at 12 oclock, a very large hemorrhoid complex comprising approximately one-third of the entire anal verge was then noted.  I placed a suture at the base of the hemorrhoid and then made elliptical incision around the surface of the hemorrhoid.  I then dissected the hemorrhoids from below the flaps, removing them in  their entirety and maintaining hemostasis with electrocautery.  I then closed the entire flap, trimming additional tissues away so as to effect a tension-free closure of the mucocutaneous junction with a running 2-0 chromic catgut.  The other hemorrhoid was located in the 6 oclock position in the anterior midline, and this was similarly grasped and infiltrated with 0.5% Marcaine with 1:200,000 epinephrine with a traction suture placed at its base and elliptical incision made from the mucosa to the mucocutaneous junction, and the hemorrhoid was dissected free from the underlying sphincter muscles, preserving the sphincter muscles in their entirety.  Both hemorrhoids were removed and sent for pathologic evaluation. The sponge, instrument, and sharp counts were verified.  The mucosa and mucocutaneous junction was closed with a running suture of 2-0 chromic catgut. All areas of resection checked for hemostasis and found to be dry.  I placed saline-soaked Gelfoam pads over each of the incisions.  Sterile dressings were then applied, anesthetic reversed, and patient removed from the operating room to the recovery room in stable condition, having tolerated the procedure well. Dictated by:   Mardene Celeste. Lurene Shadow, M.D. Attending Physician:  Sonda Primes DD:  01/22/02 TD:  01/23/02 Job: [id number]

## 2011-05-10 NOTE — Discharge Summary (Signed)
NAME:  Jasmine Jackson, Jasmine Jackson                         ACCOUNT NO.:  [id number]   MEDICAL RECORD NO.:  [id number]                   PATIENT TYPE:  INP   LOCATION:  9327                                 [facility]ACILITY:  [facility]   PHYSICIAN:  Janine Limbo, M.D.            DATE OF BIRTH:  04-03-1971   DATE OF ADMISSION:  01/14/2003  DATE OF DISCHARGE:  01/21/2003                                 DISCHARGE SUMMARY   DISCHARGE DIAGNOSES:  Pelvic abscess, persistent fever, status post  laparoscopically-assisted vaginal hysterectomy (January 04, 2003) and left  hydronephrosis.   PROCEDURES DURING ADMISSION:  1. On January 16, 2003 the patient underwent the drainage of a pelvic     abscess by an interventional radiologist at [facility] which     yielded only a small amount of old malodorous blood which was sent for     culture.  2. An abdominopelvic CT scan on January 15, 2003 which revealed a large     hematoma in the hysterectomy bed.  A 5 x 10 cm fluid collection in the     anterior pelvis with air which made it suspicious for abscess.  This     collection also compressed the sigmoid colon and proximal colon causing     small-bowel dilatation.  Additionally, there was moderate left     hydronephrosis secondary to ureteral obstruction in the pelvis.  3. On January 15, 2003, the patient had a pelvic ultrasound which revealed     complex fluid collection measuring 7 cm x 6 cm x 5 cm in the pelvis.  4. On January 18, 2003, the patient underwent vaginal drainage of pelvic     abscess yielding a total of 50 mL of purulent material, tolerating the     procedure well.   HISTORY OF PRESENT ILLNESS:  The patient is a 40 year old female who  underwent an laparoscopically-assisted vaginal hysterectomy on January 04, 2003.  The patient presented to [facility] on January  23rd complaining of abdominal pain and fever.  Please see the patient's  dictated History and Physical  Examination for details.   PHYSICAL EXAMINATION:  The patient's vital signs were stable.  The patient  had a temperature of 101.2.  Lungs were clear to auscultation bilaterally.  Heart was regular rate and rhythm.  Abdomen:  Bowel sounds were present with  diffuse tenderness, guarding, rebound, and slight distention.  There was no  CVA tenderness.  Extremities were without any tenderness.  Pelvic exam:  External genitalia were within normal limits.  Vagina revealed no active  bleeding from the vaginal cuff though there was a slight area of dried blood  at the right aspect of the vaginal cuff.  There were no palpable masses or  collections in the adnexa or on the rectal exam.   HOSPITAL COURSE:  On the date of admission, the patient was started on a  combination of gentamicin, ampicillin, and clindamycin for her febrile  morbidity.  At the time of admission, her comprehensive metabolic panel was  within normal limits.  She had a CBC which revealed a white count of 16.2  accompanied by a left shift, toxic granulations, and target cells.  Her  hemoglobin on admission was 8.4 with a platelet count of 495.  Postoperative  course was marked by spikes of temperature as high as 102.6.  The patient  underwent aforementioned procedures sequentially which accompanied by  antibiotics caused the patient's condition to improve markedly.  The patient  was seen by urologist, Dr. Isabel Caprice, for evaluation of her left hydronephrosis  which was seen on CT scan.  However, he believes that this condition was in  direct correlation with the compression by the pelvic mass the patient had  at her vaginal cuff.  By hospital stay day #7, the patient had been afebrile  for 24 hours, resumed bowel and bladder function, achieved adequate  analgesia, and therefore was deemed ready for discharge home.  Discharge  hemoglobin was 8.7.   DISCHARGE MEDICATIONS:  1. Augmentin 500 mg one tablet three times daily for 10  days.  2. Motrin 600 mg one tablet with food every 6 hours as needed for pain.  3. The patient also had prehospital medications which she was asked to     resume which included medications for pain.   FOLLOW UP:  The patient is to schedule a two-week follow up visit with  Janine Limbo, M.D. in his office.   DISCHARGE INSTRUCTIONS:  She is advised to call Dr. Stefano Gaul for a  temperature greater than 100.5 degrees, any increased pain, or increased  bleeding.   DIET:  The patient is to resume her regular diet.     Jasmine Jackson.                    Janine Limbo, M.D.    EJP/MEDQ  D:  02/25/2003  T:  02/26/2003  Job:  [id number]

## 2011-05-10 NOTE — H&P (Signed)
NAME:  Jasmine Jackson, Jasmine Jackson                         ACCOUNT NO.:  [id number]   MEDICAL RECORD NO.:  [id number]                   PATIENT TYPE:   LOCATION:                                       [facility]ILITY:  [facility]   PHYSICIAN:  Janine Limbo, M.D.            DATE OF BIRTH:  1971/04/28   DATE OF ADMISSION:  01/04/2003  DATE OF DISCHARGE:                                HISTORY & PHYSICAL   HISTORY OF PRESENT ILLNESS:  The patient is a 40 year old female para 0, 1,  1, 1 who presents for laparoscopic assisted vaginal hysterectomy because of  a fibroid uterus with menorrhagia and dysmenorrhea.  The patient had an  endometrial biopsy performed that showed normal elements.  Her most recent  Pap smear was within normal limits.  An ultrasound has confirmed a fibroid  uterus.  The patient has not responded to hormonal therapy.  Nonsteroidal  anti-inflammatory agents have been unable to relieve her discomfort.  She  wants to proceed at this time with definitive therapy.   The patient does have a past history of gonorrhea and trichomoniasis.  The  patient had a cesarean section in 2002 where she delivered a 35-week infant.   PAST OBSTETRICAL HISTORY:  The patient has had a cesarean section as  mentioned above and she has had one first trimester elective pregnancy  termination.   PAST MEDICAL HISTORY:  The patient has a history of obsessive compulsive  disorder as well as an anxiety disorder.   MEDICATIONS:  The patient's current medications include:  1. Xanax 0.5 mg p.r.n.  2. Prozac 50 mg each day.  3. BuSpar 60 mg each day.  4. Multivitamins each day.   DRUG ALLERGIES:  No known drug allergies.   SOCIAL HISTORY:  The patient denies cigarette use, alcohol use and  recreational drug use.   REVIEW OF SYSTEMS:  The patient does have a history of hemorrhoids and  constipation.  She has been evaluated and no pathology was discovered.   FAMILY HISTORY:  The patient's father, grandmother,  aunts and uncles have  hypertension.  The patient's maternal grandfather had lung cancer.   PHYSICAL EXAMINATION:  VITAL SIGNS:  Weight is 213 pounds.  HEENT:  Within normal limits.  CHEST:  Clear.  HEART:  Regular rate and rhythm.  BREASTS:  Without masses.  ABDOMEN:  Nontender.  EXTREMITIES:  Within normal limits.  NEUROLOGIC EXAMINATION:  Grossly normal.  PELVIC EXAMINATION:  External genitalia is normal.  The vagina is normal.  Cervix is nontender.  The uterus is 12 weeks size, irregular and firm.  Adnexa; no masses.  Rectovaginal exam confirms the above.  RECTAL:  Hemorrhoids are present.   ASSESSMENT:  1. Fibroid uterus.  2. Dysmenorrhea.  3. Menorrhagia.   PLAN:  The patient will undergo a laparoscopic assisted vaginal  hysterectomy.  She understands the indications for her procedure and she  accepts the risks  of, but not limited to, anesthetic complications,  bleeding, infections and possible damage to the surrounding organs.                                                 Janine Limbo, M.D.    AVS/MEDQ  D:  01/03/2003  T:  01/03/2003  Job:  [id number]

## 2011-05-10 NOTE — Discharge Summary (Signed)
[facility]  Patient:    Jasmine Jackson, Jasmine Jackson                      MRN: [id number] Adm. Date:  [id number] Disc. Date: 06/16/01 Attending:  Michaelle Copas Dictator:   Gwenlyn Perking, M.D. CC:         [facility]   Discharge Summary  ADMISSION DIAGNOSES:          1. Intrauterine pregnancy at 35 weeks 4 days                                  with ruptured membranes.                               2. Group B streptococcus positive status.                               3. Very large and multiple uterine fibroids.                               4. Depression/anxiety.  DISCHARGE DIAGNOSES:          1. Intrauterine pregnancy, delivered by                                  low transverse cesarean section on                                  June 13, 2001.                               2. Anemia.                               3. Large uterine fibroids.                               4. Depression/anxiety.  REFERRING FACILITY:           [facility].  PROCEDURES:                   Low transverse cesarean section on                               June 13, 2001 delivering a viable healthy                               female with Apgars of 8 at one minute and                               9 at five minutes, no complications.  HISTORY AND PHYSICAL EXAMINATION:                  See admission H&P.  HOSPITAL  COURSE:              The patient was admitted on June 13, 2001 at 35 weeks 4 days intrauterine pregnancy. Patient is now a G2, P1-0-1-1, who presented with ruptured membranes to Maternity Admission Unit on the morning of June 13, 2001. The patient did have a complicated prenatal course by multiple enlarged fibroids and group B strep positive status. She also has psychiatric problems and is being followed by a psychiatrist for those problems. The birth plan was to do an elective C-section due to these large fibroids and the patient was taken to the  operating room on June 13, 2001 for a low transverse cesarean section. Indications for this cesarean section was large uterine fibroids and severe variable decelerations of the infant on a fetal heart monitor. The patient as well as the infant tolerated this procedure well without complications. The postpartum period was unremarkable and the patient recovered well. Hemoglobin after the procedure was 9.5. Preprocedure hemoglobin was 12.2. On June 16, 2001 it was decided the patient had benefited maximally from this hospitalization and could be discharged home safely with routine discharge instructions.  DISCHARGE CONDITION:          Stable.  DISPOSITION:                  Discharge patient home.  DISCHARGE MEDICATIONS:        1. Percocet one p.o. q.4h. p.r.n. pain.                               2. Ibuprofen 600 mg one p.o. q.6h. p.r.n. pain.                               3. Xanax 1 mg every eight hours as needed                                  for anxiety and Prozac 60 mg daily. These                                  are prescribed by her psychiatrist.                               4. Prenatal vitamins one p.o. q.d. x six weeks.                               5. Iron 325 mg one p.o. q.d. x six weeks.  DISCHARGE INSTRUCTIONS:       Activity per routine C-section instructions, no heavy lifting. Diet as tolerated. Wound care per instruction booklet. Symptoms to warrant further treatment: should the patient experience any severe pain or discharge from her wound or fever, she is to come back to the Maternity Admission Unit for reevaluation and treatment.  DISCHARGE FOLLOWUP:           The patient is to follow up in six weeks for postpartum check. DD:  06/16/01 TD:  06/16/01 Job: [id number]

## 2011-05-10 NOTE — H&P (Signed)
NAME:  Jasmine Jackson, Jasmine Jackson                         ACCOUNT NO.:  [id number]   MEDICAL RECORD NO.:  [id number]                   PATIENT TYPE:  INP   LOCATION:  9327                                 FACILITY:  [facility]   PHYSICIAN:  Osborn Coho, M.D.                DATE OF BIRTH:  Dec 03, 1971   DATE OF ADMISSION:  01/14/2003  DATE OF DISCHARGE:                                HISTORY & PHYSICAL   CHIEF COMPLAINT:  Pelvic pain and vaginal bleeding.   HISTORY OF PRESENT ILLNESS:  The patient is a 40 year old black female,  gravida 2, para 0-1-1-1, who is status post LAVH with lysis of adhesions on  January 13, secondary to symptomatic fibroid uterus, who presented to the  hospital with sudden onset of pelvic pain and vaginal bleeding per patient.  The patient is currently being treated for a UTI on Macrobid and Pyridium.  She complains of a gush of vaginal bleeding at about 8 p.m. with  abdominal/pelvic pain. She reports feeling chills over the past couple of  days.  She reports burning with urination, no frequency, and no urgency.  The pain radiates along the right side primarily. The patient reports some  nausea, but denies vomiting, and has a good appetite.  She denies any change  in bowel movements and she had a bowel movement earlier today. She feels  like she has to have a bowel movement now.   PAST OBSTETRICAL HISTORY:  Cesarean section at 35 weeks in 2002 and an  elective termination of pregnancy x1.   PAST GYN HISTORY:  History of fibroids that had become symptomatic. She is  status post an LAVH as mentioned above.  The patient reports a history of  gonorrhea and Trichomoniasis.  The patient's most recent Pap prior to  surgery was within normal limits.  The patient had an endometrial biopsy  prior to surgery which was also normal or negative for any malignancy.   PAST MEDICAL HISTORY:  Obsessive compulsive disorder and anxiety.   PAST SURGICAL HISTORY:  LAVH  (laparoscopically-assisted vaginal  hysterectomy) on January 13, with lysis of adhesions.   MEDICATIONS:  Xanax, Prozac, BuSpar, multivitamin.   ALLERGIES:  No known drug allergies.   SOCIAL HISTORY:  The patient denies a history of cigarette use, alcohol use,  or recreational drug use.   REVIEW OF SYSTEMS:  The patient has a history of hemorrhoids and  constipation. She was evaluated with no pathology.  She also has a history  of anemia.   PHYSICAL EXAMINATION:  VITAL SIGNS: Blood pressure 148/85, pulse 108,  temperature 101.2.  Weight is approximately 213 pounds.  LUNGS:  Clear to auscultation bilaterally.  HEART:  Regular rate and rhythm.  NECK:  No neck masses.  ABDOMEN:  Positive diffuse discomfort, positive guarding, positive rebound,  positive bowel sounds, slightly distended.  No CVA tenderness.  EXTREMITIES:  No calf tenderness.  PELVIC:  No active bleeding from cuff, positive slight spotting on right  aspect of cuff.  Vagina with no palpable masses/collections.  RECTAL:  No palpable masses, no stool in vault.   ASSESSMENT:  The patient is a 40 year old para 1, status post  laparoscopically-assisted vaginal hysterectomy with lysis of adhesions on  January 13, being admitted with pelvic pain and febrile morbidity.   PLAN:  1. Will obtain a pelvic ultrasound to rule out a collection.  2. Fever, will do a fever workup with blood cultures x2, urine culture, and     CBC.  3. Will check a chem-7 to evaluate creatinine.  4. Will give pain medicine as needed.  5. Will place on triple antibiotics (ampicillin, gentamicin, and     Clindamycin).                                               Osborn Coho, M.D.    AR/MEDQ  D:  01/18/2003  T:  01/18/2003  Job:  [id number]

## 2011-05-10 NOTE — Op Note (Signed)
[facility]  Patient:    Jasmine Jackson, Jasmine Jackson                      MRN: [id number] Proc. Date: 06/13/01 Adm. Date:  [id number] Disc. Date: [id number] Attending:  Michaelle Copas                           Operative Report  PREOPERATIVE DIAGNOSES:       1. Intrauterine pregnancy at 35-1/[redacted] weeks                                  gestation.                               2. Preterm labor.                               3. Severe variable fetal heart decelerations.                               4. Known large leiomyomata.  POSTOPERATIVE DIAGNOSIS:  OPERATION:                    Low transverse cesarean section.  SURGEON:                      Conni Elliot, M.D.  ASSISTANT:                    Gwenlyn Perking, M.D.  ANESTHESIA:                   Continuous lumbar epidural.  DESCRIPTION OF PROCEDURE:     The patient was placed under continuous lumbar epidural anesthesia.  The patient was in the supine left lateral tilt position and receiving oxygen.  The abdomen was prepped and draped in sterile fashion. A low transverse Pfannenstiel incision was made and extended through the skin and subcutaneous fascia.  The rectus muscles were separated in the midline. The peritoneum was entered and a bladder flap created.  A low transverse uterine incision was made.  The baby was delivered from the vertex presentation.  The cord was doubly clamped and cut.  The baby was handed to the neonatologist in attendance.  The placenta delivered spontaneously.  The uterus, bladder flap, anterior peritoneum, fascia and skin were closed in standard fashion.  Estimated blood loss was approximately 800 cc. DD:  08/04/01 TD:  08/04/01 Job: [id number]

## 2011-05-10 NOTE — Discharge Summary (Signed)
[facility]  Patient:    Jasmine Jackson, Jasmine Jackson                      MRN: [id number] Adm. Date:  [id number] Disc. Date: [id number] Attending:  Michaelle Copas Dictator:   Michell Heinrich, M.D. CC:         [id number] Hurshel Party, M.D.   Discharge Summary  ADMISSION DIAGNOSES:          1. Preterm contractions.                               2. Uterine fibroids.  DISCHARGE DIAGNOSES:          1. Preterm contractions.                               2. Uterine fibroids.  CHRONIC PROBLEM LIST:         1. Obsessive compulsive disorder.                               2. Anxiety disorder with depressive symptoms.                               3. History of uterine fibroids with resultant                                  chronic abdominal pain.  DISCHARGE MEDICATIONS:        1. Darvocet-N 100 one tablet p.o. q.6h. p.r.n.                                  pain.                               2. Xanax 0.5 mg p.o. t.i.d. p.r.n.                               3. Prozac 60 mg per day.                               (These were the admission medications for this                               patient as well.)  CONSULTS:                     None.  PROCEDURES:                   None.  SERVICE:                      OB teaching service.  ATTENDING:                    Conni Elliot, M.D.  RESIDENT:  Dr. Burnadette Peter.  INTERN:                       Michell Heinrich, M.D.  HISTORY AND PHYSICAL:         For complete H&P, please see resident H&P in chart.  Briefly, this is a 40 year old African-American female who his G2, P0-0-1-0 at 24-3/[redacted] weeks gestation who presented with complaint of vaginal discharge and questionable spontaneous rupture of membranes early on the morning of presentation.  She also reported having some uterine contractions beginning that morning which were getting more frequent.  PAST MEDICAL HISTORY:         Multiple  uterine fibroids with resultant chronic abdominal pain for which she takes occasional Darvocet, and [facility].  Other past history is as stated in chronic problem list.  PHYSICAL EXAMINATION:         Upon initial evaluation, vital signs were stable and cervical examination showed copious white discharge and the cervix was not visible secondary to large uterine fibroids.  Digital cervical exam:  The cervix was not palpable secondary to fibroids.  The fetal heart rate was baseline 150 to 160 with long-term variability and occasional multiple variable decels.  Contractions were every two to three minutes on tocometer. Other laboratory:  Nitrazine positive.  Fern negative.  No pooling seen on vaginal examination.  With this patient being 24-3/7 weeks and now with uterine contractions and questionable cervical change (unable to evaluate cervix secondary to position secondary to uterine fibroids), the patient was admitted for possible preterm labor and further observation.  HOSPITAL COURSE:              Preterm Contractions:  Soon after admission to the hospital, she was placed on Unasyn and ibuprofen.  She continued to have one to two uterine contractions per hour for the first several hours but then had only uterine irritability with an occasional uterine contraction.  She did not complain of any new abdominal pain, but said that her old abdominal pain remained and was normal for her.  She did not report any pressure or pelvic pressure or any further feeling of loss of fluid.  An ultrasound was done to assess the cervix and showed a cervical length of 4 cm. Amniotic fluid was within normal limits showing a 5.6 cm fluid pocket.  On the morning after admission, the patient was reevaluated and it was determined that she was not having any significant contractions and that she could be discharged home without tocolytic and without antibiotics.  DISPOSITION:                   She was discharged home in stable condition and instructed to take the previously mentioned discharge medications, which she had been taking prior to admission.  FOLLOW-UP:                    She has an appointment at [facility] other Wednesday and the next one was this week and was told to keep this appointment.  DISCHARGE INSTRUCTIONS:       She was instructed to decrease activity as much as possible with bed rest being ideal, and to return for any symptoms of preterm labor or any other symptoms that she was unsure of, including changes in her abdominal pain. DD:  03/29/01 TD:  03/30/01 Job: [id number]

## 2011-05-10 NOTE — Discharge Summary (Signed)
NAME:  Jasmine Jackson, Jasmine Jackson                         ACCOUNT NO.:  [id number]   MEDICAL RECORD NO.:  [id number]                   PATIENT TYPE:  OBV   LOCATION:  9307                                 FACILITY:  [facility]   PHYSICIAN:  Janine Limbo, M.D.            DATE OF BIRTH:  1971/08/23   DATE OF ADMISSION:  01/04/2003  DATE OF DISCHARGE:  01/05/2003                                 DISCHARGE SUMMARY   DISCHARGE DIAGNOSES:  1. Fibroid uterus.  2. Dysmenorrhea.  3. Menorrhagia.  4. Pelvic adhesions.   OPERATIONS:  Laparoscopy-assisted vaginal hysterectomy with lysis of  adhesions.  The patient was found to have a multiple fibroid uterus  measuring 12-14 weeks size along with pelvic adhesions and normal-appearing  tubes and ovaries.   HISTORY OF PRESENT ILLNESS:  The patient is a 40 year old female para 2 who  presents for a laparoscopy-assisted vaginal hysterectomy because of a  fibroid uterus with menorrhagia and dysmenorrhea.  Please see the patient's  dictated History and Physical Examination for details.   PHYSICAL EXAMINATION:  VITAL SIGNS:  Weight 213 pounds.  GENERAL:  Within normal limits.  PELVIC:  External genitalia is normal.  Vagina is normal.  Cervix is  nontender.  Uterus is 12-week size, irregular, and firm.  Adnexa without  masses.  Rectovaginal exam confirms the above.  Do note, however that the  patient does have external hemorrhoids.   HOSPITAL COURSE:  On the date of admission the patient underwent the  aforementioned procedure, tolerating it well.  Immediately postoperatively  the patient had difficulty achieving pain control.  However, she was finally  given relief with a Fentanyl PCA pump.  Postoperative hemoglobin was 9.9  (preoperative hemoglobin 13.5).  By postoperative day #1 the patient had  achieved bowel and bladder function and was therefore deemed ready for  discharge home.   DISCHARGE MEDICATIONS:  1. Vicodin one to two tablets q.4-6h. as  needed for pain.  2. Ibuprofen 600 mg one tablet with food q.6h. for five days then as needed     for pain.  3. Phenergan 25 mg one tablet q.6h. as needed for nausea.  4. Colace one tablet twice daily until bowel movements are regular.  5. Iron 325 mg twice daily for six weeks.  6. The patient is to resume her prehospital medications.   DISCHARGE INSTRUCTIONS:  The patient was given a copy of [facility] postoperative instruction sheet.  She was further  advised to avoid driving for two weeks, heavy lifting for four weeks, and  intercourse for six weeks.   FOLLOW-UP:  She is scheduled for follow-up with Dr. Leonard Schwartz  in six weeks on February 15, 2003 at 2:30 p.m.   DIET:  Without restriction.   PATHOLOGY:  Final pathology was not available at the time of the patient's  discharge.     Elmira J. Powell, P.A.  Janine Limbo, M.D.    EJP/MEDQ  D:  01/05/2003  T:  01/05/2003  Job:  [id number]

## 2011-05-10 NOTE — Op Note (Signed)
NAME:  Jasmine Jackson, Jasmine Jackson                         ACCOUNT NO.:  [id number]   MEDICAL RECORD NO.:  [id number]                   PATIENT TYPE:  OBV   LOCATION:  9307                                 FACILITY:  [facility]   PHYSICIAN:  Janine Limbo, M.D.            DATE OF BIRTH:  [birth year]   DATE OF PROCEDURE:  01/04/2003  DATE OF DISCHARGE:                                 OPERATIVE REPORT   PREOPERATIVE DIAGNOSES:  1. Fibroid uterus.  2. Menorrhagia.  3. Dysmenorrhea.   POSTOPERATIVE DIAGNOSES:  1. Fibroid uterus.  2. Menorrhagia.  3. Dysmenorrhea.   PROCEDURE:  Laparoscopically-assisted vaginal hysterectomy.   SURGEON:  Janine Limbo, M.D.   FIRST ASSISTANT:  Elmira J. Lowell Guitar, P.A.-C.   ANESTHESIA:  General.   DISPOSITION:  The patient is a 40 year old female, who presented with the  above-mentioned diagnoses.  She understands the indications for her surgical  procedure and she accepted the risks of, but not limited to, anesthetic  complications, bleeding, infections, and possible damage to the surrounding  organs.   FINDINGS:  The patient had a 14-week size multifibroid uterus with two  pedunculated fibroids from the fundus.  The fallopian tubes and the ovaries  appeared normal.  The appendix and liver appeared normal.  The bowel  appeared normal.  The ureters were traced throughout their course in the  pelvis and they were felt to be away from our surgical plane.   DESCRIPTION OF PROCEDURE:  The patient was taken to the operating room,  where a general anesthetic was given.  The patient's abdomen, perineum, and  vagina were prepped with multiple layers of Betadine.  A Foley catheter was  placed in the bladder.  The patient was sterilely draped.  The subumbilical  area was injected with 0.5% Marcaine with epinephrine.  A subumbilical  incision was made and the incision was carried sharply through the  subcutaneous tissue, the fascia, and the anterior peritoneum.   The Hasson  cannula was sutured into place.  A pneumoperitoneum was then obtained.  The  pelvic structures were visualized.  A suprapubic incision was made in the  midline after injecting the skin with 0.5% Marcaine with epinephrine.  A 5  mm trocar was placed under direct visualization.  Pictures were taken of the  patient's pelvic anatomy.  The anterior and posterior cul-de-sacs were felt  to be free and we were comfortable that we could safely proceed with vaginal  surgery.  The bladder flap was developed anteriorly.  We then proceeded with  the vaginal approach.  The patient was placed in a more lithotomy position.  The cervix was injected with a diluted solution of Pitressin and saline.  A  circumferential incision was made around the cervix and the vaginal mucosa  was advanced both anteriorly and posteriorly.  The anterior cul-de-sac and  then the posterior cul-de-sac were sharply entered.  Alternating from right  to  left, the uterosacral ligaments, paracervical tissues, perimetrial  tissues, and uterine arteries were clamped, cut, sutured, and tied securely.  Attempts were made to invert the uterus through the posterior colpotomy,  these attempts were unsuccessful.  We then decided to proceed with  morcellation of the uterus.  The cervix was transected from the body of the  uterus.  Under direct visualization, portions of the patient's fibroids were  removed sharply from the body of the uterus.  We were then able to invert  the uterus through the posterior colpotomy.  The remainder of the upper  pedicles were secured and the uterus was transected from the operative  field.  The upper pedicles were then free tied and suture ligated.  Figure-  of-eight sutures were used for hemostasis.  Hemostasis was thought to be  adequate.  The sutures attached to the uterosacral ligaments were brought  out through the vaginal angles and tied securely.  A McCall culdoplasty  suture was placed in the  posterior cul-de-sac incorporating the uterosacral  ligaments bilaterally.  A final check was made for hemostasis and again  hemostasis was noted to be adequate.  The posterior and anterior peritoneal  surfaces were then closed using a purse-string suture.  The vaginal cuff was  closed using figure-of-eight sutures.  Zero Vicryl was the suture material  used thus far in the procedure.  The McCall culdoplasty suture was tied  securely and the apex of the vagina was noted to elevate into the mid  pelvis.  The pneumoperitoneum was then reestablished.  The pelvis was  inspected.  The pelvis was irrigated.  Again, hemostasis was noted to be  adequate.  The irrigation fluid was aspirated.  The pneumoperitoneum was  allowed to escape.  All instruments were removed.  The fascia at the  subumbilical incision was then closed using figure-of-eight sutures.  The  skin was reapproximated using a subcuticular suture of 3-0 Vicryl at the  subumbilical area and also the suprapubic area.  Sponge, instrument, and  needle counts were correct on two occasions.  The estimated blood loss for  the procedure was 350 cc.  The patient tolerated her procedure well.  She  was noted to drain clear yellow urine.  The patient was awakened from her  anesthetic and taken to the recovery room in stable condition.                                                Janine Limbo, M.D.    AVS/MEDQ  D:  01/04/2003  T:  01/04/2003  Job:  [id number]

## 2011-05-10 NOTE — Discharge Summary (Signed)
[facility]  Patient:    Jasmine Jackson, Jasmine Jackson Visit Number: [id number] MRN: [id number]          Service Type: MED Location: [facility] Attending Physician:  Michaelle Copas Dictated by:   Nicoletta Ba, M.D. Adm. Date:  06/30/2001 Disc. Date: 06/30/2001                             Discharge Summary  ADMISSION DIAGNOSES:          1. A 25-week intrauterine pregnancy.                               2. Multiple uterine fibroids.                               3. Group B streptococcus positive.                               4. Preterm contractions.  DISCHARGE DIAGNOSES:          1. A 25-week intrauterine pregnancy.                               2. Multiple uterine fibroids.                               3. Group B streptococcus positive.                               4. Preterm contractions.  DISCHARGE MEDICATIONS:        1. Prenatal vitamin.                               2. Prozac.                               3. Darvocet p.r.n.                               4. Zantac.  CONSULTS:                     None.  PROCEDURES:                   Complete OB ultrasound showed a cervical length of 4.2 cm, amniotic fluid volume normal; also showed two separate fibroids in the lower uterine segment and multiple other fibroids.  HISTORY AND PHYSICAL:         For complete H&P, please see resident H&P in chart. Briefly, this is a 40 year old, G2, P0-0-1-0, who presented at [redacted] weeks gestation after being found to be group B strep positive, and to have frequent contractions. She had a history of preterm contractions during this pregnancy which were explained by her uterine fibroids, and she had no progressive cervical change. She was afebrile and physical exam did reveal a soft globular uterus which was nontender. Cervical exam was technically difficulty secondary to an extremely anterior cervix because of  the large anterior fibroid. The fetal baseline heart rate  was in the 150s with good variability. The patient was admitted for further observation on a continuous tocodynamometry and placed on Ibuprofen and Unasyn. After admission to the hospital, her hospital course was as follows.  HOSPITAL COURSE:              The patient was admitted. She continued to have four to six uterine contractions per hour which was unchanged from what she had had several weeks prior to admission. The fetal monitoring revealed a reactive strip with several variable decelerations to 100. The patient was continued on Ibuprofen and Unasyn for three days and had no significant change in her cervix or in the frequency of her contractions. Fetal heart tracing remained with occasional moderate variables which were nonrepetitive. Long-term variability was good and the overall assessment was a reassuring fetal heart tracing. The patient was discharged home on April 02, 2001 after it was determined she was not in preterm labor and she had received a short course of IV antibiotics and fetal heart tracing was reassuring.  DISPOSITION:                  She was discharged home on the previously mentioned discharge medications, and followup was to be arranged at [facility]. She was instructed to continue with modified bed rest at home and there were no dietary restrictions placed. Dictated by:   Nicoletta Ba, M.D. Attending Physician:  Michaelle Copas DD:  08/17/01 TD:  08/17/01 Job: [id number]

## 2011-05-10 NOTE — H&P (Signed)
NAME:  Jasmine Jackson, Jasmine Jackson                         ACCOUNT NO.:  [id number]   MEDICAL RECORD NO.:  [id number]                   PATIENT TYPE:  INP   LOCATION:  9327                                 FACILITY:  [facility]   PHYSICIAN:  Osborn Coho, M.D.                DATE OF BIRTH:  30-Oct-1971   DATE OF ADMISSION:  01/14/2003  DATE OF DISCHARGE:                                HISTORY & PHYSICAL   HISTORY OF PRESENT ILLNESS:  The patient is a 40 year old gravida 2, para 0,  1, 1, 1 who presents tonight 10 days status post a laparoscopic assisted  vaginal hysterectomy with onset of severe pain and vaginal bleeding.  Her  history is remarkable for a laparoscopic assisted vaginal hysterectomy  performed on January 04, 2003 by Dr. Stefano Gaul.  This was done because of a  fibroid uterus with menorrhagia and dysmenorrhea.  Endometrial biopsy was  normal.  The patient tolerated the procedure well; however, had significant  pain issues post surgery and a fentanyl drip was required.   Upon presentation tonight the patient was noted to have a small amount of  bleeding probably from the vaginal cuff.  She has significant bruising of  the lower abdomen and has tenderness to palpation of that area and rebound.  She also has a temperature of 101.2.  She is therefore to be admitted to the  Gyn floor for antibiotics, pelvic ultrasound and further evaluation.   PAST MEDICAL HISTORY:  Includes a history of obsessive compulsive disorder  and anxiety disorder, which are maintained with medications; Xanax, Prozac  and BuSpar.  She also has a history in the past of gonorrhea and  trichomonas.   PAST SURGICAL HISTORY:  Surgical history includes a cesarean section in 2002  at 35 weeks.  She also had a first trimester elective pregnancy termination.  She had the previously noted laparoscopic assisted vaginal hysterectomy on  January 13.   MEDICATIONS:  The patient's current medications include:  1. Xanax 0.5 mg for  a total of 1 gram per day.  2. Prozac 50 mg daily.  3. BuSpar 60 mg daily.  4. Naprosyn p.r.n.  5. Iron 900 mg daily.  6. Macrobid.  7. Pyridium.   ALLERGIES:  The patient is allergic to MORPHINE, which causes itching.   SOCIAL HISTORY:  The patient denies cigarette use, alcohol use and  recreational drug use.   REVIEW OF SYSTEMS:  The patient has a history of hemorrhoids and  constipation.  She also has a history of a small amount of vaginal bleeding  and pelvic pain.   FAMILY HISTORY:  The patient's father, grandmother, aunts and uncles have  hypertension.  The patient's maternal grandfather had lung cancer.   PHYSICAL EXAMINATION:  VITAL SIGNS:  Temperature is 101.2 on admission and  has elevated to 102.3.  Pulse is 108-110.  Respirations are 24.  Blood  pressure is 145-148  systolic over 85-86 diastolic.  GENERAL:  Pain is a 10/10 per the patient.  ABDOMEN:  Abdomen is remarkable for bruising from approximately the mid  lower abdomen down to the suprapubic area, and tenderness to palpation with  positive rebound.  The patient's periumbilical incision is normal.  All of  the patient's abdomen does appear to be very full.  Bowel sounds are noted,  but the abdomen is slightly distended.  To does report normal bowel  movements with one earlier today.  CVA tenderness is negative.  LUNGS:  Lungs are clear.  HEART:  Regular rate and rhythm without murmur.  BREASTS:  The breasts are soft and nontender.  EXTREMITIES:  The extremities are negative.  VAGINAL EXAMINATION:  Speculum exam shows no active bleeding from the cuff,  but a slight amount of spotting on the right aspect of the cuff.  PELVIC EXAMINATION:  Pelvic exam shows no palpable masses or collections.  RECTAL EXAMINATION:  No palpable masses.  No stool noted in the vault,  although the patient does report a significant amount of rectal pressure.  NEUROLOGIC EXAMINATION:  Neurologic exam is normal.   LABORATORY DATA:   Hemoglobin is 8.4, hematocrit 25.1, WBC count 16.2 and  platelet count of 495,000.   ASSESSMENT:  1. Status post laparoscopic assisted vaginal hysterectomy times 10 days.  2. Postoperative fever.  3. Abdominal tenderness and bruising.  4. Small amount of bleeding from the vaginal cuff.  5. Obsessive compulsive disorder.  6. Anxiety disorder.   PLAN:  1. Admit to the 3rd floor secondary to febrile morbidity status post LAVH on     January 13.  2. Vitals q4.  3. Diet; NPO, except for sips.  4. IV of D5, half normal saline at 125 mL an hour.  5. Antibiotics; ampicillin 2 grams IV the first dose now, gentamicin q24h     dosing per pharmacy protocol and clindamycin 900 mg IV q8h with the first     dose now.  6. Toradol 30 mg IV q6h p.r.n. pain; if pain not covered by Toradol may give     Dilaudid 1 mg IV q4h p.r.n. pain.  7. Other medications:  Xanax 1 mg p.o. q8h, Prozac 20 mg p.o. daily and     BuSpar 10 mg p.o. b.i.d.  8. Pelvic ultrasound now to evaluate for collection; transabdominal first     then transvaginal if necessary.  9. Strict pad count.  10.      M.D.'s will follow.       Renaldo Reel Emilee Hero, C.N.M.                   Osborn Coho, M.D.    VLL/MEDQ  D:  01/14/2003  T:  01/15/2003  Job:  [id number]

## 2011-08-19 ENCOUNTER — Encounter: Payer: Medicare Other | Admitting: Genetic Counselor

## 2011-08-29 ENCOUNTER — Observation Stay (HOSPITAL_COMMUNITY)
Admission: EM | Admit: 2011-08-29 | Discharge: 2011-08-29 | Disposition: A | Discharge status: Home / Self Care | Payer: Medicare Other | Source: Ambulatory Visit | Attending: Emergency Medicine | Admitting: Emergency Medicine

## 2011-08-29 ENCOUNTER — Emergency Department (HOSPITAL_COMMUNITY): Payer: Medicare Other

## 2011-08-29 DIAGNOSIS — R112 Nausea with vomiting, unspecified: Secondary | ICD-10-CM | POA: Insufficient documentation

## 2011-08-29 DIAGNOSIS — R079 Chest pain, unspecified: Principal | ICD-10-CM | POA: Insufficient documentation

## 2011-08-29 DIAGNOSIS — R0989 Other specified symptoms and signs involving the circulatory and respiratory systems: Secondary | ICD-10-CM | POA: Insufficient documentation

## 2011-08-29 DIAGNOSIS — R197 Diarrhea, unspecified: Secondary | ICD-10-CM | POA: Insufficient documentation

## 2011-08-29 DIAGNOSIS — R072 Precordial pain: Secondary | ICD-10-CM

## 2011-08-29 DIAGNOSIS — R0609 Other forms of dyspnea: Secondary | ICD-10-CM | POA: Insufficient documentation

## 2011-08-29 LAB — POCT I-STAT, CHEM 8
BUN: 9 mg/dL (ref 6–23)
Chloride: 103 mEq/L (ref 96–112)
Creatinine, Ser: 0.8 mg/dL (ref 0.50–1.10)
Glucose, Bld: 97 mg/dL (ref 70–99)
Potassium: 4.3 mEq/L (ref 3.5–5.1)
Sodium: 140 mEq/L (ref 135–145)

## 2011-08-29 LAB — CBC
Hemoglobin: 12.6 g/dL (ref 12.0–15.0)
MCH: 30.5 pg (ref 26.0–34.0)
MCV: 88.6 fL (ref 78.0–100.0)
Platelets: 320 10*3/uL (ref 150–400)
RBC: 4.13 MIL/uL (ref 3.87–5.11)
WBC: 8.4 10*3/uL (ref 4.0–10.5)

## 2011-08-29 LAB — DIFFERENTIAL
Lymphs Abs: 1.7 10*3/uL (ref 0.7–4.0)
Monocytes Relative: 10 % (ref 3–12)
Neutro Abs: 5.6 10*3/uL (ref 1.7–7.7)
Neutrophils Relative %: 67 % (ref 43–77)

## 2011-08-29 LAB — POCT I-STAT TROPONIN I
Troponin i, poc: 0 ng/mL (ref 0.00–0.08)
Troponin i, poc: 0.01 ng/mL (ref 0.00–0.08)

## 2011-09-17 LAB — PREGNANCY, URINE: Preg Test, Ur: NEGATIVE

## 2011-09-17 LAB — URINALYSIS, ROUTINE W REFLEX MICROSCOPIC
Glucose, UA: NEGATIVE
Leukocytes, UA: NEGATIVE
Nitrite: NEGATIVE
Specific Gravity, Urine: 1.028
pH: 6.5

## 2011-09-17 LAB — URINE MICROSCOPIC-ADD ON

## 2011-10-04 LAB — URINALYSIS, ROUTINE W REFLEX MICROSCOPIC
Bilirubin Urine: NEGATIVE
Glucose, UA: NEGATIVE
Ketones, ur: NEGATIVE
Protein, ur: NEGATIVE
pH: 7

## 2011-10-04 LAB — DIFFERENTIAL
Basophils Absolute: 0
Lymphocytes Relative: 26
Neutro Abs: 5.6

## 2011-10-04 LAB — D-DIMER, QUANTITATIVE: D-Dimer, Quant: 0.33

## 2011-10-04 LAB — CBC
Hemoglobin: 13.4
Platelets: 325
RDW: 13.8
WBC: 8.5

## 2011-10-04 LAB — POCT CARDIAC MARKERS
CKMB, poc: 1.3
Myoglobin, poc: 55.2
Operator id: 4074
Troponin i, poc: <0.05

## 2012-07-08 DIAGNOSIS — F410 Panic disorder [episodic paroxysmal anxiety] without agoraphobia: Secondary | ICD-10-CM | POA: Diagnosis not present

## 2012-07-08 DIAGNOSIS — F3189 Other bipolar disorder: Secondary | ICD-10-CM | POA: Diagnosis not present

## 2012-10-08 ENCOUNTER — Encounter (HOSPITAL_COMMUNITY): Payer: Self-pay | Admitting: Nurse Practitioner

## 2012-10-08 ENCOUNTER — Emergency Department (HOSPITAL_COMMUNITY)
Admission: EM | Admit: 2012-10-08 | Discharge: 2012-10-08 | Disposition: A | Discharge status: Home / Self Care | Payer: Medicare Other | Attending: Emergency Medicine | Admitting: Emergency Medicine

## 2012-10-08 ENCOUNTER — Emergency Department (HOSPITAL_COMMUNITY): Payer: Medicare Other

## 2012-10-08 DIAGNOSIS — D649 Anemia, unspecified: Secondary | ICD-10-CM | POA: Insufficient documentation

## 2012-10-08 DIAGNOSIS — R079 Chest pain, unspecified: Secondary | ICD-10-CM | POA: Insufficient documentation

## 2012-10-08 DIAGNOSIS — F17200 Nicotine dependence, unspecified, uncomplicated: Secondary | ICD-10-CM | POA: Diagnosis not present

## 2012-10-08 DIAGNOSIS — R0789 Other chest pain: Secondary | ICD-10-CM | POA: Diagnosis not present

## 2012-10-08 DIAGNOSIS — M25519 Pain in unspecified shoulder: Secondary | ICD-10-CM | POA: Insufficient documentation

## 2012-10-08 DIAGNOSIS — Z79899 Other long term (current) drug therapy: Secondary | ICD-10-CM | POA: Diagnosis not present

## 2012-10-08 DIAGNOSIS — M79609 Pain in unspecified limb: Secondary | ICD-10-CM | POA: Diagnosis not present

## 2012-10-08 HISTORY — DX: Cardiac murmur, unspecified: R01.1

## 2012-10-08 LAB — BASIC METABOLIC PANEL
CO2: 22 mEq/L (ref 19–32)
Chloride: 104 mEq/L (ref 96–112)
Creatinine, Ser: 0.71 mg/dL (ref 0.50–1.10)
GFR calc Af Amer: >90 mL/min (ref >90)
Sodium: 139 mEq/L (ref 135–145)

## 2012-10-08 LAB — CBC
MCV: 87.6 fL (ref 78.0–100.0)
Platelets: 264 10*3/uL (ref 150–400)
RBC: 4.37 MIL/uL (ref 3.87–5.11)
RDW: 13.4 % (ref 11.5–15.5)
WBC: 6.5 10*3/uL (ref 4.0–10.5)

## 2012-10-08 LAB — POCT I-STAT TROPONIN I: Troponin i, poc: 0 ng/mL (ref 0.00–0.08)

## 2012-10-08 LAB — D-DIMER, QUANTITATIVE: D-Dimer, Quant: <0.27 ug/mL-FEU (ref 0.00–0.48)

## 2012-10-08 MED ORDER — OXYCODONE-ACETAMINOPHEN 5-325 MG PO TABS
1.0000 | ORAL_TABLET | Freq: Once | ORAL | Status: AC
  Administered 2012-10-08: 1 via ORAL
  Filled 2012-10-08: qty 1

## 2012-10-08 MED ORDER — OXYCODONE-ACETAMINOPHEN 5-325 MG PO TABS: 1.0000 | ORAL_TABLET | Freq: Four times a day (QID) | ORAL | Status: DC | PRN

## 2012-10-08 MED ORDER — FERROUS SULFATE 325 (65 FE) MG PO TABS: 325.0000 mg | ORAL_TABLET | Freq: Three times a day (TID) | ORAL | Status: DC

## 2012-10-08 NOTE — ED Notes (Signed)
Pt states that yesterday around 1355 she was driving her car and felt a squeezing sensation located mid sternal. Pt states that she has been using her inhaler more frequently, due to SOB. Pt now reports no chest pain, however her R arm is hurting. Onset of arm pain was this morning around 0615. She states that it initally started in her upper arm and is now radiating to finger tips.

## 2012-10-08 NOTE — ED Notes (Signed)
AIDET performed. 

## 2012-10-08 NOTE — ED Notes (Signed)
Pt reports yesterday she was driving her car and felt a heaviness in her chest "like someone was lying on me." Got out of her car and walked around and felt better. Then last night had severe cramps in her legs and felt sweaty. Intermittent diaphoresis since and R arm hurts today. No CP since yesterday. Hx heart murmur as a child, no other cardiac history. A&Ox4, resp e/u

## 2012-10-08 NOTE — ED Notes (Signed)
Unable to move to CDU at this time d/t lack of space

## 2012-10-08 NOTE — ED Provider Notes (Signed)
History     CSN: [id number]  Arrival date & time 10/08/12  1301   First MD Initiated Contact with Patient 10/08/12 1421      Chief Complaint  Patient presents with  . Arm Pain    (Consider location/radiation/quality/duration/timing/severity/associated sxs/prior treatment) HPI Pt presents with multiple complaints she c/o yesterday having chest tightness and anxiety symptoms.  This came on while driving her car and resolved when she got out of the car to walk around.  Last night she had severe leg cramping bilaterally.  Today she complains of right shoulder and arm pain.  This is her primary complaint in the ED.  Pain is worse with movement and palpation.  No injury, no swelling of arm.  No weakness or numbness of arm.  No fever. No sob or chest pain.  There are no other associated systemic symptoms, there are no other alleviating or modifying factors.   Past Medical History  Diagnosis Date  . Heart murmur     Past Surgical History  Procedure Date  . Abdominal hysterectomy     History reviewed. No pertinent family history.  History  Substance Use Topics  . Smoking status: Current Every Day Smoker  . Smokeless tobacco: Not on file  . Alcohol Use: No    OB History    Grav Para Term Preterm Abortions TAB SAB Ect Mult Living                  Review of Systems ROS reviewed and all otherwise negative except for mentioned in HPI  Allergies  Morphine and related  Home Medications   Current Outpatient Rx  Name Route Sig Dispense Refill  . BUSPIRONE HCL 15 MG PO TABS Oral Take 30 mg by mouth 2 (two) times daily.    Marland Kitchen CLONAZEPAM 1 MG PO TABS Oral Take 1 mg by mouth 3 (three) times daily.    Marland Kitchen FLUOXETINE HCL 20 MG PO CAPS Oral Take 80 mg by mouth daily.    Marland Kitchen LAMOTRIGINE 200 MG PO TABS Oral Take 300 mg by mouth at bedtime.    . METHOCARBAMOL 500 MG PO TABS Oral Take 500 mg by mouth 4 (four) times daily as needed. Muscle spasms    . OMEPRAZOLE 20 MG PO CPDR Oral Take 20 mg  by mouth daily.    . QUETIAPINE FUMARATE 200 MG PO TABS Oral Take 400 mg by mouth at bedtime.    Marland Kitchen FERROUS SULFATE 325 (65 FE) MG PO TABS Oral Take 1 tablet (325 mg total) by mouth 3 (three) times daily. 90 tablet 0  . OXYCODONE-ACETAMINOPHEN 5-325 MG PO TABS Oral Take 1-2 tablets by mouth every 6 (six) hours as needed for pain. 15 tablet 0    BP 131/79  Pulse 62  Temp 98.4 F (36.9 C) (Oral)  Resp 16  SpO2 100% Vitals reviewed Physical Exam Physical Examination: General appearance - alert, anxious appearing, and in no distress Mental status - alert, oriented to person, place, and time Eyes - pupils equal and reactive, extraocular eye movements intact Mouth - mucous membranes moist, pharynx normal without lesions Chest - clear to auscultation, no wheezes, rales or rhonchi, symmetric air entry Heart - normal rate, regular rhythm, normal S1, S2, no murmurs, rubs, clicks or gallops Abdomen - soft, nontender, nondistended, no masses or organomegaly Neurological - alert, oriented, normal speech, no focal findings or movement disorder noted Extremities - peripheral pulses normal, no pedal edema, no clubbing or cyanosis Musculoskeletal- FROM of right  shoulder/arm, some pain with movmement, pain with palpation of upper arm diffusely- no point tenderness Skin - normal coloration and turgor, no rashes, no suspicious skin lesions noted Psych-anxious, cooperative, pleasant  ED Course  Procedures (including critical care time)  Labs Reviewed  CBC - Abnormal; Notable for the following:    Hemoglobin 7.6 (*)     MCH 17.4 (*)     MCHC 19.8 (*)     All other components within normal limits  BASIC METABOLIC PANEL  D-DIMER, QUANTITATIVE  POCT I-STAT TROPONIN I  LAB REPORT - SCANNED   No results found.   1. Chest pain   2. Anemia       MDM  Pt presenting with c/o chest heaviness since yesterday.  She also complains of pain in her right arm- pain is worse with movement and palpation.   Ekg, troponin and d-dimer all reassuring.  Pt is found to be anemic on lab workup. She has no hx of this- hemoccult negative.  Pt states she does not have menstrual cycles due to having hysterectomy.  Not tachycardic, no syncope, no other significant symptoms.  Will start on iron supplementation, d/w patient the importance of f/u with PMD in the next 2-3 days.  Discharged with strict return precautions.  Pt agreeable with plan.        Ethelda Chick, MD 10/14/12 [phone]

## 2012-10-13 ENCOUNTER — Encounter (HOSPITAL_COMMUNITY): Payer: Self-pay | Admitting: *Deleted

## 2012-10-13 ENCOUNTER — Emergency Department (HOSPITAL_COMMUNITY)
Admission: EM | Admit: 2012-10-13 | Discharge: 2012-10-13 | Disposition: A | Discharge status: Home / Self Care | Payer: Medicare Other | Attending: Emergency Medicine | Admitting: Emergency Medicine

## 2012-10-13 DIAGNOSIS — R011 Cardiac murmur, unspecified: Secondary | ICD-10-CM | POA: Diagnosis not present

## 2012-10-13 DIAGNOSIS — Z0189 Encounter for other specified special examinations: Secondary | ICD-10-CM | POA: Diagnosis not present

## 2012-10-13 DIAGNOSIS — Z79899 Other long term (current) drug therapy: Secondary | ICD-10-CM | POA: Insufficient documentation

## 2012-10-13 DIAGNOSIS — Z0000 Encounter for general adult medical examination without abnormal findings: Secondary | ICD-10-CM

## 2012-10-13 DIAGNOSIS — Z0389 Encounter for observation for other suspected diseases and conditions ruled out: Secondary | ICD-10-CM | POA: Diagnosis not present

## 2012-10-13 DIAGNOSIS — F17200 Nicotine dependence, unspecified, uncomplicated: Secondary | ICD-10-CM | POA: Diagnosis not present

## 2012-10-13 LAB — CBC WITH DIFFERENTIAL/PLATELET
Basophils Absolute: 0 10*3/uL (ref 0.0–0.1)
Basophils Relative: 0 % (ref 0–1)
Hemoglobin: 13.7 g/dL (ref 12.0–15.0)
MCHC: 34.7 g/dL (ref 30.0–36.0)
Neutro Abs: 2.8 10*3/uL (ref 1.7–7.7)
Neutrophils Relative %: 46 % (ref 43–77)
Platelets: 334 10*3/uL (ref 150–400)
RDW: 13.6 % (ref 11.5–15.5)

## 2012-10-13 NOTE — ED Notes (Signed)
Pt states she is here to get cbc checked since she cannot get into see physician.  No active bleeding, just was told she was anemic by [place] on Thursday and was told to have her hgb checked a couple of days

## 2012-10-13 NOTE — ED Provider Notes (Signed)
Medical screening examination/treatment/procedure(s) were performed by non-physician practitioner and as supervising physician I was immediately available for consultation/collaboration.  Davionne Mastrangelo R. Vitoria Conyer, MD 10/13/12 2015 

## 2012-10-13 NOTE — ED Notes (Signed)
Had family issue and had to go to car.

## 2012-10-13 NOTE — ED Provider Notes (Signed)
History     CSN: [id number]  Arrival date & time 10/13/12  4098   First MD Initiated Contact with Patient 10/13/12 1124      Chief Complaint  Patient presents with  . needs hgb checked--     (Consider location/radiation/quality/duration/timing/severity/associated sxs/prior treatment) HPI Comments: Patient reports she is here for a recheck of her anemia.  She was seen in the ED 10/08/12 found to have a Hgb 7.6, was asked to see her doctor but has been unable to due to an outstanding bill.  Patient states she is feeling well, does feel tired occasionally.  States yesterday her entire food intake consistent of carrots for breakfast and walnuts for dinner, states this is normal for her.  Denies SOB or lightheadedness when moving around or walking.  Denies any bleeding including epistaxis, hemoptysis, hematuria, hematochezia, melena, vaginal bleeding.    The history is provided by the patient.    Past Medical History  Diagnosis Date  . Heart murmur     Past Surgical History  Procedure Date  . Abdominal hysterectomy     No family history on file.  History  Substance Use Topics  . Smoking status: Current Every Day Smoker  . Smokeless tobacco: Not on file  . Alcohol Use: No    OB History    Grav Para Term Preterm Abortions TAB SAB Ect Mult Living                  Review of Systems  Respiratory: Negative for shortness of breath.   Cardiovascular: Negative for chest pain.  Gastrointestinal: Negative for blood in stool and anal bleeding.  Genitourinary: Negative for hematuria, vaginal bleeding and menstrual problem.  Neurological: Negative for weakness and light-headedness.    Allergies  Morphine and related  Home Medications   Current Outpatient Rx  Name Route Sig Dispense Refill  . BUSPIRONE HCL 15 MG PO TABS Oral Take 30 mg by mouth 2 (two) times daily.    Marland Kitchen CLONAZEPAM 1 MG PO TABS Oral Take 1 mg by mouth 3 (three) times daily.    Marland Kitchen FERROUS SULFATE 325 (65 FE)  MG PO TABS Oral Take 1 tablet (325 mg total) by mouth 3 (three) times daily. 90 tablet 0  . FLUOXETINE HCL 20 MG PO CAPS Oral Take 80 mg by mouth daily.    Marland Kitchen LAMOTRIGINE 200 MG PO TABS Oral Take 300 mg by mouth at bedtime.    . METHOCARBAMOL 500 MG PO TABS Oral Take 500 mg by mouth 4 (four) times daily as needed. Muscle spasms    . OMEPRAZOLE 20 MG PO CPDR Oral Take 20 mg by mouth daily.    . OXYCODONE-ACETAMINOPHEN 5-325 MG PO TABS Oral Take 1-2 tablets by mouth every 6 (six) hours as needed for pain. 15 tablet 0  . QUETIAPINE FUMARATE 200 MG PO TABS Oral Take 400 mg by mouth at bedtime.      BP 133/80  Pulse 65  Temp 98.1 F (36.7 C) (Oral)  Resp 18  SpO2 100%  Physical Exam  Nursing note and vitals reviewed. Constitutional: She appears well-developed and well-nourished. No distress.  HENT:  Head: Normocephalic and atraumatic.  Neck: Neck supple.  Cardiovascular: Normal rate and regular rhythm.   Pulmonary/Chest: Effort normal and breath sounds normal. No respiratory distress. She has no wheezes. She has no rales.  Abdominal: Soft. She exhibits no distension and no mass. There is no tenderness. There is no rebound and no guarding.  Neurological:  She is alert.  Skin: She is not diaphoretic.  Psychiatric: She has a normal mood and affect. Her behavior is normal.    ED Course  Procedures (including critical care time)   Labs Reviewed  CBC WITH DIFFERENTIAL   No results found.  Hgb now 13.7  1. Normal physical exam    MDM  Pt seen in ED 10/17, found to be anemic, d/c home and asked to follow up with PCP.  Pt unable to follow up with PCP yet because of unpaid bill, though she states she will be able to follow up soon.  Pt has no active bleeding, no symptoms consistent with anemia.  Hgb today is 13.7.  Discussed results with patient.  Advised eating more substantial meals, f/u with PCP.  Pt has a significant number of iron pills left, I have advised that she reduce her dosage  to BID from TID and follow closely with PCP.  Pt verbalizes understanding and agrees with plan.          [place], [place] 10/13/12 1531

## 2012-10-25 ENCOUNTER — Emergency Department (HOSPITAL_COMMUNITY)
Admission: EM | Admit: 2012-10-25 | Discharge: 2012-10-25 | Disposition: A | Discharge status: Home / Self Care | Payer: Medicare Other | Attending: Emergency Medicine | Admitting: Emergency Medicine

## 2012-10-25 ENCOUNTER — Encounter (HOSPITAL_COMMUNITY): Payer: Self-pay | Admitting: *Deleted

## 2012-10-25 DIAGNOSIS — F17200 Nicotine dependence, unspecified, uncomplicated: Secondary | ICD-10-CM | POA: Diagnosis not present

## 2012-10-25 DIAGNOSIS — R350 Frequency of micturition: Secondary | ICD-10-CM | POA: Insufficient documentation

## 2012-10-25 DIAGNOSIS — Z860100 Personal history of colon polyps, unspecified: Secondary | ICD-10-CM | POA: Insufficient documentation

## 2012-10-25 DIAGNOSIS — Z791 Long term (current) use of non-steroidal anti-inflammatories (NSAID): Secondary | ICD-10-CM | POA: Insufficient documentation

## 2012-10-25 DIAGNOSIS — Z862 Personal history of diseases of the blood and blood-forming organs and certain disorders involving the immune mechanism: Secondary | ICD-10-CM | POA: Insufficient documentation

## 2012-10-25 DIAGNOSIS — K625 Hemorrhage of anus and rectum: Secondary | ICD-10-CM

## 2012-10-25 DIAGNOSIS — K644 Residual hemorrhoidal skin tags: Secondary | ICD-10-CM | POA: Diagnosis not present

## 2012-10-25 DIAGNOSIS — Z79899 Other long term (current) drug therapy: Secondary | ICD-10-CM | POA: Diagnosis not present

## 2012-10-25 HISTORY — DX: Anemia, unspecified: D64.9

## 2012-10-25 LAB — URINALYSIS, ROUTINE W REFLEX MICROSCOPIC
Bilirubin Urine: NEGATIVE
Ketones, ur: NEGATIVE mg/dL
Nitrite: NEGATIVE
pH: 7 (ref 5.0–8.0)

## 2012-10-25 LAB — COMPREHENSIVE METABOLIC PANEL
ALT: 31 U/L (ref 0–35)
Alkaline Phosphatase: 56 U/L (ref 39–117)
CO2: 25 mEq/L (ref 19–32)
GFR calc Af Amer: >90 mL/min (ref >90)
GFR calc non Af Amer: >90 mL/min (ref >90)
Glucose, Bld: 87 mg/dL (ref 70–99)
Potassium: 3.9 mEq/L (ref 3.5–5.1)
Sodium: 137 mEq/L (ref 135–145)
Total Protein: 7.2 g/dL (ref 6.0–8.3)

## 2012-10-25 LAB — URINE MICROSCOPIC-ADD ON

## 2012-10-25 LAB — CBC
Hemoglobin: 13.1 g/dL (ref 12.0–15.0)
RBC: 4.29 MIL/uL (ref 3.87–5.11)

## 2012-10-25 MED ORDER — POLYETHYLENE GLYCOL 3350 17 GM/SCOOP PO POWD: 17.0000 g | Freq: Every day | ORAL | Status: DC

## 2012-10-25 MED ORDER — DOCUSATE SODIUM 100 MG PO CAPS: 100.0000 mg | ORAL_CAPSULE | Freq: Two times a day (BID) | ORAL | Status: DC

## 2012-10-25 NOTE — ED Notes (Signed)
Patient states that this am she noticed bright red blood in her stools.  Patient has history of anemia.  Patient feels weak , alert and oriented x 3.

## 2012-10-25 NOTE — ED Provider Notes (Signed)
History     CSN: [id number]  Arrival date & time 10/25/12  1128   First MD Initiated Contact with Patient 10/25/12 1150      Chief Complaint  Patient presents with  . Rectal Bleeding    (Consider location/radiation/quality/duration/timing/severity/associated sxs/prior treatment) HPI Comments: Patient presents today with a chief complaint of rectal bleeding.  She reports that she has noticed bright red blood mixed in with her stool for the past 3 days.  She denies melena.  No pain with BM.  She has a history of hemorrhoids.  She denies recent constipation.  She is currently not on any blood thinners.  She does have a history of iron deficiency anemia.  She reports that she had rectal bleeding occur in the past.  In 2009 she was evaluated by Dr. Russella Dar with GI for the same.  At that time she had a Colonoscopy performed,which showed an internal hemorrhoid and colon polyps.  Patient has not notified GI physician of this new onset rectal bleeding.  She reports that she has mild mid lower abdominal pain for the past 2-3 days.  No nausea or vomiting.  No diarrhea.  No fever or chills.  Patient is a 41 y.o. female presenting with hematochezia. The history is provided by the patient.  Rectal Bleeding  Associated symptoms include abdominal pain. Pertinent negatives include no fever, no diarrhea, no nausea, no vomiting, no hematuria, no vaginal bleeding and no vaginal discharge.    Past Medical History  Diagnosis Date  . Heart murmur   . Anemia     Past Surgical History  Procedure Date  . Abdominal hysterectomy     No family history on file.  History  Substance Use Topics  . Smoking status: Current Every Day Smoker  . Smokeless tobacco: Not on file  . Alcohol Use: No    OB History    Grav Para Term Preterm Abortions TAB SAB Ect Mult Living                  Review of Systems  Constitutional: Negative for fever and chills.  Respiratory: Negative for shortness of breath.     Gastrointestinal: Positive for abdominal pain, blood in stool and hematochezia. Negative for nausea, vomiting, diarrhea and constipation.  Genitourinary: Positive for frequency. Negative for dysuria, hematuria, flank pain, vaginal bleeding and vaginal discharge.  Neurological: Negative for syncope.    Allergies  Morphine and related  Home Medications   Current Outpatient Rx  Name  Route  Sig  Dispense  Refill  . BUSPIRONE HCL 15 MG PO TABS   Oral   Take 30 mg by mouth 2 (two) times daily.         Marland Kitchen CLONAZEPAM 1 MG PO TABS   Oral   Take 1 mg by mouth 3 (three) times daily.         Marland Kitchen FERROUS SULFATE 325 (65 FE) MG PO TABS   Oral   Take 1 tablet (325 mg total) by mouth 3 (three) times daily.   90 tablet   0   . FLUOXETINE HCL 20 MG PO CAPS   Oral   Take 80 mg by mouth daily.         Marland Kitchen LAMOTRIGINE 200 MG PO TABS   Oral   Take 300 mg by mouth at bedtime.         . METHOCARBAMOL 500 MG PO TABS   Oral   Take 500 mg by mouth 4 (four) times daily  as needed. Muscle spasms         . NAPROXEN SODIUM 220 MG PO TABS   Oral   Take 220 mg by mouth daily.         . QUETIAPINE FUMARATE 200 MG PO TABS   Oral   Take 400 mg by mouth at bedtime.           BP 124/79  Pulse 64  Temp 99.5 F (37.5 C) (Oral)  Resp 16  Physical Exam  Nursing note and vitals reviewed. Constitutional: She appears well-developed and well-nourished.  HENT:  Head: Normocephalic and atraumatic.  Cardiovascular: Normal rate, regular rhythm and normal heart sounds.   Pulmonary/Chest: Effort normal and breath sounds normal.  Abdominal: Soft. Bowel sounds are normal. She exhibits no distension and no mass. There is no rigidity, no rebound, no guarding and no tenderness at McBurney's point.       Mild suprapubic tenderness to palpation.  Genitourinary: Rectal exam shows external hemorrhoid. Rectal exam shows no fissure. Guaiac positive stool.       No gross blood on rectal exam Several  small non thrombosed external hemorrhoids.  Neurological: She is alert.  Skin: Skin is warm and dry.  Psychiatric: She has a normal mood and affect.    ED Course  Procedures (including critical care time)   Labs Reviewed  CBC  COMPREHENSIVE METABOLIC PANEL  OCCULT BLOOD, POC DEVICE  URINALYSIS, ROUTINE W REFLEX MICROSCOPIC   No results found.   No diagnosis found.    MDM  Patient presenting with a chief complaint of rectal bleeding that has been occuring over the past 3 days.  Bleeding bright red and associated with bowel movements.  Hemoccult positive, but no gross bleeding on rectal exam.  Patient does have several small non thrombosed hemorrhoids.  Her hemoglobin today is stable at 13.1.  Therefore, feel that patient can be discharged home with GI follow up.  She has seen Dr. Russella Dar in the past.  Return precautions discussed with the patient.        Pascal Lux [place], PA-C 10/25/12 1525

## 2012-10-25 NOTE — ED Provider Notes (Signed)
Medical screening examination/treatment/procedure(s) were performed by non-physician practitioner and as supervising physician I was immediately available for consultation/collaboration.   Richardean Canal, MD 10/25/12 [phone]

## 2012-10-27 ENCOUNTER — Encounter: Payer: Self-pay | Admitting: Gastroenterology

## 2012-10-27 DIAGNOSIS — M79609 Pain in unspecified limb: Secondary | ICD-10-CM | POA: Diagnosis not present

## 2012-10-27 DIAGNOSIS — M62838 Other muscle spasm: Secondary | ICD-10-CM | POA: Diagnosis not present

## 2012-10-27 DIAGNOSIS — K625 Hemorrhage of anus and rectum: Secondary | ICD-10-CM | POA: Diagnosis not present

## 2012-10-27 DIAGNOSIS — Z23 Encounter for immunization: Secondary | ICD-10-CM | POA: Diagnosis not present

## 2012-11-09 ENCOUNTER — Ambulatory Visit: Payer: Medicare Other | Admitting: Gastroenterology

## 2012-11-09 DIAGNOSIS — K625 Hemorrhage of anus and rectum: Secondary | ICD-10-CM | POA: Diagnosis not present

## 2012-11-10 DIAGNOSIS — F410 Panic disorder [episodic paroxysmal anxiety] without agoraphobia: Secondary | ICD-10-CM | POA: Diagnosis not present

## 2012-11-10 DIAGNOSIS — F3189 Other bipolar disorder: Secondary | ICD-10-CM | POA: Diagnosis not present

## 2012-12-23 DIAGNOSIS — K589 Irritable bowel syndrome without diarrhea: Secondary | ICD-10-CM

## 2012-12-23 HISTORY — DX: Irritable bowel syndrome, unspecified: K58.9

## 2013-01-19 DIAGNOSIS — R197 Diarrhea, unspecified: Secondary | ICD-10-CM | POA: Diagnosis not present

## 2013-01-19 DIAGNOSIS — R1084 Generalized abdominal pain: Secondary | ICD-10-CM | POA: Diagnosis not present

## 2013-01-19 DIAGNOSIS — R112 Nausea with vomiting, unspecified: Secondary | ICD-10-CM | POA: Diagnosis not present

## 2013-03-11 DIAGNOSIS — R109 Unspecified abdominal pain: Secondary | ICD-10-CM | POA: Diagnosis not present

## 2013-03-11 DIAGNOSIS — R82998 Other abnormal findings in urine: Secondary | ICD-10-CM | POA: Diagnosis not present

## 2013-03-11 DIAGNOSIS — R1110 Vomiting, unspecified: Secondary | ICD-10-CM | POA: Diagnosis not present

## 2013-03-11 DIAGNOSIS — R197 Diarrhea, unspecified: Secondary | ICD-10-CM | POA: Diagnosis not present

## 2013-03-12 DIAGNOSIS — F3189 Other bipolar disorder: Secondary | ICD-10-CM | POA: Diagnosis not present

## 2013-03-12 DIAGNOSIS — F410 Panic disorder [episodic paroxysmal anxiety] without agoraphobia: Secondary | ICD-10-CM | POA: Diagnosis not present

## 2013-03-17 ENCOUNTER — Encounter: Payer: Self-pay | Admitting: Gastroenterology

## 2013-04-08 DIAGNOSIS — R6883 Chills (without fever): Secondary | ICD-10-CM | POA: Diagnosis not present

## 2013-04-08 DIAGNOSIS — R509 Fever, unspecified: Secondary | ICD-10-CM | POA: Diagnosis not present

## 2013-04-13 ENCOUNTER — Encounter: Payer: Self-pay | Admitting: Gastroenterology

## 2013-04-13 ENCOUNTER — Ambulatory Visit (INDEPENDENT_AMBULATORY_CARE_PROVIDER_SITE_OTHER): Payer: Medicare Other | Admitting: Gastroenterology

## 2013-04-13 VITALS — BP 110/66 | HR 72 | Ht 66.5 in | Wt 182.4 lb

## 2013-04-13 DIAGNOSIS — R634 Abnormal weight loss: Secondary | ICD-10-CM

## 2013-04-13 DIAGNOSIS — K921 Melena: Secondary | ICD-10-CM

## 2013-04-13 DIAGNOSIS — R112 Nausea with vomiting, unspecified: Secondary | ICD-10-CM

## 2013-04-13 DIAGNOSIS — R198 Other specified symptoms and signs involving the digestive system and abdomen: Secondary | ICD-10-CM | POA: Diagnosis not present

## 2013-04-13 MED ORDER — PEG-KCL-NACL-NASULF-NA ASC-C 100 G PO SOLR: 1.0000 | Freq: Once | ORAL | Status: DC

## 2013-04-13 NOTE — Patient Instructions (Addendum)
You have been given a separate informational sheet regarding your tobacco use, the importance of quitting and local resources to help you quit.  You have been scheduled for an abdominal ultrasound at [facility] Radiology (1st floor of hospital) on 04/15/13 at 9:30am. Please arrive 15 minutes prior to your appointment for registration. Make certain not to have anything to eat or drink 6 hours prior to your appointment. Should you need to reschedule your appointment, please contact radiology at [phone]. This test typically takes about 30 minutes to perform.  You have been scheduled for an endoscopy and colonoscopy with propofol. Please follow the written instructions given to you at your visit today. Please pick up your prep at the pharmacy within the next 1-3 days. If you use inhalers (even only as needed), please bring them with you on the day of your procedure. Your physician has requested that you go to www.startemmi.com and enter the access code given to you at your visit today. This web site gives a general overview about your procedure. However, you should still follow specific instructions given to you by our office regarding your preparation for the procedure.  Thank you for choosing me and [facility].  [name]. Pleas Koch., MD., [facility]

## 2013-04-13 NOTE — Progress Notes (Signed)
History of Present Illness: This is a 42 year old female accompanied by her son. She notes a change in bowel habits with frequent episodes of constipation and diarrhea that follows constipation. She notes small amounts of bright red blood associated with bowel movements about 3 times each week for several months. She previously underwent colonoscopy in 2009 showing internal hemorrhoids. She relates episodes of nausea and vomiting that are not clearly related to bowel movements, meals or any other symptoms. She notes that her nausea and vomiting improves when she eats less. She notes a 10 pound weight loss. She describes her vomitus as dark and foul-smelling but denies bleeding. She was treated with Cipro and Flagyl for lower abdominal pain several weeks ago and the symptoms resolved. Blood work and UA obtained one month ago were unremarkable. Denies change in stool caliber, melena,  dysphagia, reflux symptoms, chest pain.  Current Medications, Allergies, Past Medical History, Past Surgical History, Family History and Social History were reviewed in [employer] record.  Physical Exam: General: Well developed , well nourished, no acute distress Head: Normocephalic and atraumatic Eyes:  sclerae anicteric, EOMI Ears: Normal auditory acuity Mouth: No deformity or lesions Lungs: Clear throughout to auscultation Heart: Regular rate and rhythm; no murmurs, rubs or bruits Abdomen: Soft, non tender and non distended. No masses, hepatosplenomegaly or hernias noted. Normal Bowel sounds Rectal: Deferred to colonoscopy Musculoskeletal: Symmetrical with no gross deformities  Pulses:  Normal pulses noted Extremities: No clubbing, cyanosis, edema or deformities noted Neurological: Alert oriented x 4, grossly nonfocal Psychological:  Alert and cooperative. Normal mood and affect  Assessment and Recommendations:  1. Intermittent nausea and vomiting associated with weight loss. Continue  omeprazole 20 mg daily. Schedule upper endoscopy and abdominal ultrasound. The risks, benefits, and alternatives to endoscopy with possible biopsy and possible dilation were discussed with the patient and they consent to proceed.   2. Change in bowel habits, hematochezia. I suspect she has hemorrhoidal bleeding and IBS. Rule out IBD, colorectal neoplasms and other disorders. Maintain a high fiber diet with adequate fluid intake. MiraLax twice a day when necessary for constipation. The risks, benefits, and alternatives to colonoscopy with possible biopsy and possible polypectomy were discussed with the patient and they consent to proceed.

## 2013-04-15 ENCOUNTER — Ambulatory Visit (HOSPITAL_COMMUNITY)
Admission: RE | Admit: 2013-04-15 | Discharge: 2013-04-15 | Disposition: A | Discharge status: Home / Self Care | Payer: Medicare Other | Source: Ambulatory Visit | Attending: Gastroenterology | Admitting: Gastroenterology

## 2013-04-15 ENCOUNTER — Ambulatory Visit (HOSPITAL_COMMUNITY): Payer: Medicare Other

## 2013-04-15 DIAGNOSIS — N289 Disorder of kidney and ureter, unspecified: Secondary | ICD-10-CM | POA: Insufficient documentation

## 2013-04-15 DIAGNOSIS — R634 Abnormal weight loss: Secondary | ICD-10-CM

## 2013-04-15 DIAGNOSIS — R112 Nausea with vomiting, unspecified: Secondary | ICD-10-CM | POA: Insufficient documentation

## 2013-04-15 DIAGNOSIS — R109 Unspecified abdominal pain: Secondary | ICD-10-CM | POA: Diagnosis not present

## 2013-04-15 DIAGNOSIS — N2889 Other specified disorders of kidney and ureter: Secondary | ICD-10-CM | POA: Diagnosis not present

## 2013-04-21 ENCOUNTER — Ambulatory Visit (AMBULATORY_SURGERY_CENTER): Payer: Medicare Other | Admitting: Gastroenterology

## 2013-04-21 ENCOUNTER — Encounter: Payer: Self-pay | Admitting: Gastroenterology

## 2013-04-21 VITALS — BP 157/86 | HR 63 | Temp 98.4°F | Resp 20 | Ht 66.5 in | Wt 182.0 lb

## 2013-04-21 DIAGNOSIS — K921 Melena: Secondary | ICD-10-CM | POA: Diagnosis not present

## 2013-04-21 DIAGNOSIS — J45909 Unspecified asthma, uncomplicated: Secondary | ICD-10-CM | POA: Diagnosis not present

## 2013-04-21 DIAGNOSIS — R112 Nausea with vomiting, unspecified: Secondary | ICD-10-CM

## 2013-04-21 DIAGNOSIS — R634 Abnormal weight loss: Secondary | ICD-10-CM

## 2013-04-21 DIAGNOSIS — R198 Other specified symptoms and signs involving the digestive system and abdomen: Secondary | ICD-10-CM | POA: Diagnosis not present

## 2013-04-21 DIAGNOSIS — D126 Benign neoplasm of colon, unspecified: Secondary | ICD-10-CM

## 2013-04-21 MED ORDER — SODIUM CHLORIDE 0.9 % IV SOLN: 500.0000 mL | INTRAVENOUS | Status: DC

## 2013-04-21 NOTE — Progress Notes (Signed)
Called to room to assist during endoscopic procedure.  Patient ID and intended procedure confirmed with present staff. Received instructions for my participation in the procedure from the performing physician.  

## 2013-04-21 NOTE — Patient Instructions (Addendum)
YOU HAD AN ENDOSCOPIC PROCEDURE TODAY AT THE [facility]: Refer to the procedure report that was given to you for any specific questions about what was found during the examination.  If the procedure report does not answer your questions, please call your gastroenterologist to clarify.  If you requested that your care partner not be given the details of your procedure findings, then the procedure report has been included in a sealed envelope for you to review at your convenience later.  YOU SHOULD EXPECT: Some feelings of bloating in the abdomen. Passage of more gas than usual.  Walking can help get rid of the air that was put into your GI tract during the procedure and reduce the bloating. If you had a lower endoscopy (such as a colonoscopy or flexible sigmoidoscopy) you may notice spotting of blood in your stool or on the toilet paper. If you underwent a bowel prep for your procedure, then you may not have a normal bowel movement for a few days.  DIET: Your first meal following the procedure should be a light meal and then it is ok to progress to your normal diet.  A half-sandwich or bowl of soup is an example of a good first meal.  Heavy or fried foods are harder to digest and may make you feel nauseous or bloated.  Likewise meals heavy in dairy and vegetables can cause extra gas to form and this can also increase the bloating.  Drink plenty of fluids but you should avoid alcoholic beverages for 24 hours.  ACTIVITY: Your care partner should take you home directly after the procedure.  You should plan to take it easy, moving slowly for the rest of the day.  You can resume normal activity the day after the procedure however you should NOT DRIVE or use heavy machinery for 24 hours (because of the sedation medicines used during the test).    SYMPTOMS TO REPORT IMMEDIATELY: A gastroenterologist can be reached at any hour.  During normal business hours, 8:30 AM to 5:00 PM Monday through Friday,  call [phone].  After hours and on weekends, please call the GI answering service at [phone] who will take a message and have the physician on call contact you.   Following lower endoscopy (colonoscopy or flexible sigmoidoscopy):  Excessive amounts of blood in the stool  Significant tenderness or worsening of abdominal pains  Swelling of the abdomen that is new, acute  Fever of 100F or higher  Following upper endoscopy (EGD)  Vomiting of blood or coffee ground material  New chest pain or pain under the shoulder blades  Painful or persistently difficult swallowing  New shortness of breath  Fever of 100F or higher  Black, tarry-looking stools  FOLLOW UP: If any biopsies were taken you will be contacted by phone or by letter within the next 1-3 weeks.  Call your gastroenterologist if you have not heard about the biopsies in 3 weeks.  Our staff will call the home number listed on your records the next business day following your procedure to check on you and address any questions or concerns that you may have at that time regarding the information given to you following your procedure. This is a courtesy call and so if there is no answer at the home number and we have not heard from you through the emergency physician on call, we will assume that you have returned to your regular daily activities without incident.  Continue your anti-reflux medication.  SIGNATURES/CONFIDENTIALITY: You and/or your care partner have signed paperwork which will be entered into your electronic medical record.  These signatures attest to the fact that that the information above on your After Visit Summary has been reviewed and is understood.  Full responsibility of the confidentiality of this discharge information lies with you and/or your care-partner.

## 2013-04-21 NOTE — Op Note (Signed)
 [facility] [address] [place], [address]   ENDOSCOPY PROCEDURE REPORT  PATIENT: Jasmine, Jackson  MR#: [id number] BIRTHDATE: 03/18/1971 , 41  yrs. old GENDER: Female ENDOSCOPIST: Meryl Dare, MD, [facility] REFERRED BY:  Cammie Fulp PROCEDURE DATE:  04/21/2013 PROCEDURE:  EGD, diagnostic ASA CLASS:     Class II INDICATIONS:  Nausea.   Vomiting.   Weight loss. MEDICATIONS: MAC sedation, administered by CRNA, residual sedation effect present from prior procedure, propofol (Diprivan) 100mg  IV  TOPICAL ANESTHETIC: Cetacaine Spray DESCRIPTION OF PROCEDURE: After the risks benefits and alternatives of the procedure were thoroughly explained, informed consent was obtained.  [facility] GIF-H180 [id number] endoscope was introduced through the mouth and advanced to the second portion of the duodenum without limitations.  The instrument was slowly withdrawn as the mucosa was fully examined.  ESOPHAGUS: The mucosa of the esophagus appeared normal. STOMACH: The mucosa and folds of the stomach appeared normal. DUODENUM: The duodenal mucosa showed no abnormalities in the bulb and second portion of the duodenum.  Retroflexed views revealed a small hiatal hernia.  The scope was then withdrawn from the patient and the procedure completed.  COMPLICATIONS: There were no complications.  ENDOSCOPIC IMPRESSION: 1.   Small hiatal hernia 2.   The EGD otherwise appeared normal  RECOMMENDATIONS: 1.  Anti-reflux regimen 2.  Continue PPI   eSigned:  Meryl Dare, MD, [facility] 04/21/2013 2:26 PM

## 2013-04-21 NOTE — Op Note (Signed)
[facility] [address] [place], [address]   COLONOSCOPY PROCEDURE REPORT  PATIENT: Jasmine Jackson, Jasmine Jackson  MR#: [id number] BIRTHDATE: Feb 17, 1971 , 41  yrs. old GENDER: Female ENDOSCOPIST: Meryl Dare, MD, [facility] REFERRED BY: Cain Saupe, MD PROCEDURE DATE:  04/21/2013 PROCEDURE:   Colonoscopy with snare polypectomy ASA CLASS:   Class II INDICATIONS:hematochezia and change in bowel habits. MEDICATIONS: MAC sedation, administered by CRNA and propofol (Diprivan) 300mg  IV DESCRIPTION OF PROCEDURE:   After the risks benefits and alternatives of the procedure were thoroughly explained, informed consent was obtained.  A digital rectal exam revealed no abnormalities of the rectum.   The LB CF-H180AL [id number]  endoscope was introduced through the anus and advanced to the cecum, which was identified by both the appendix and ileocecal valve. No adverse events experienced.   The quality of the prep was excellent, using MoviPrep  The instrument was then slowly withdrawn as the colon was fully examined.  COLON FINDINGS: Three sessile polyps were found in the sigmoid colon measuring 3-4 mm.  A polypectomy was performed with a cold snare. The resection was complete and the polyp tissue was partially retrieved.   The colon was otherwise normal.  There was no diverticulosis, inflammation, polyps or cancers unless previously stated.  Retroflexed views revealed moderate internal hemorrhoids. The time to cecum=2 minutes 44 seconds.  Withdrawal time=11 minutes 58 seconds.  The scope was withdrawn and the procedure completed. COMPLICATIONS: There were no complications.  ENDOSCOPIC IMPRESSION: 1.   Three sessile polyps in the sigmoid colon; polypectomy performed with a cold snare 2.   Moderate internal hemorrhoids  RECOMMENDATIONS: 1.  Await pathology results 2.  Repeat colonoscopy in 5 years if polyp adenomatous; otherwise 10 years 3.  Treat for hemorrhoids and  IBS   eSigned:  Meryl Dare, MD, [facility] 04/21/2013 2:20 PM

## 2013-04-21 NOTE — Progress Notes (Addendum)
Patient did not have preoperative order for IV antibiotic SSI prophylaxis. (G8918)  Patient did not experience any of the following events: a burn prior to discharge; a fall within the facility; wrong site/side/patient/procedure/implant event; or a hospital transfer or hospital admission upon discharge from the facility. (G8907)  

## 2013-04-22 ENCOUNTER — Telehealth: Payer: Self-pay | Admitting: *Deleted

## 2013-04-22 DIAGNOSIS — K649 Unspecified hemorrhoids: Secondary | ICD-10-CM

## 2013-04-22 MED ORDER — HYDROCORTISONE ACETATE 25 MG RE SUPP: 25.0000 mg | Freq: Two times a day (BID) | RECTAL | Status: DC | PRN

## 2013-04-22 NOTE — Telephone Encounter (Signed)
She has int hemorrhoids. Anusol HC bid prn, #14, 5 refills

## 2013-04-22 NOTE — Telephone Encounter (Signed)
Spoke with patient. Notified her that Dr.Stark is aware of her BRB with BM this morning and he does want her to try the anusol supp. This new medication was sent to her pharmacy.  She understands. Encouraged patient to call us back is this does not improve.

## 2013-04-22 NOTE — Telephone Encounter (Signed)
  Follow up Call-  Call back number 04/21/2013 04/21/2013  Post procedure Call Back phone  # [phone]  Permission to leave phone message Yes -     Patient questions:  Do you have a fever, pain , or abdominal swelling? no Pain Score  0 *  Have you tolerated food without any problems? yes  Have you been able to return to your normal activities? yes  Do you have any questions about your discharge instructions: Diet   no Medications  no Follow up visit  no  Do you have questions or concerns about your Care? no  Actions:  * If pain score is 4 or above: No action needed, pain <4.

## 2013-04-22 NOTE — Telephone Encounter (Signed)
Patient c/o seeing small amounts light pink blood on tissue from rectum. She was asking for medications for that area, she states years ago she used Analpram supp. Which would help.  Please advise.

## 2013-04-22 NOTE — Telephone Encounter (Signed)
Patient called back to say that she just used the restroom and saw bright red blood and is a little scared.

## 2013-04-23 ENCOUNTER — Telehealth: Payer: Self-pay | Admitting: Gastroenterology

## 2013-04-23 MED ORDER — HYDROCORTISONE ACE-PRAMOXINE 1-1 % RE FOAM: 1.0000 | Freq: Two times a day (BID) | RECTAL | Status: DC

## 2013-04-23 NOTE — Telephone Encounter (Signed)
Can pt try proctofoam for the hemorrhoids? Annusol supp were to expensive. Please advsie.

## 2013-04-23 NOTE — Telephone Encounter (Signed)
Proctofoam cream sent to pharmacy and pt aware.

## 2013-04-23 NOTE — Telephone Encounter (Signed)
She can try proctofoam or any other HC containing foam or supp that is affordable for her hemorrhoids If these treatments don't work will refer to Dr. Maisie Fus, colorectal surgeon

## 2013-04-23 NOTE — Telephone Encounter (Signed)
The patient states that the medicine (Anusol Supp.)  Dr. Russella Dar called in for her after her procedure is to expensive.  Is there something else less expensive she can use.

## 2013-04-26 ENCOUNTER — Other Ambulatory Visit: Payer: Self-pay

## 2013-04-26 ENCOUNTER — Encounter: Payer: Self-pay | Admitting: Gastroenterology

## 2013-04-28 ENCOUNTER — Telehealth: Payer: Self-pay | Admitting: Gastroenterology

## 2013-04-28 NOTE — Telephone Encounter (Signed)
Patient advised to increase the amount of fluid and fiber in her diet.  She is asked to try Miralax prn if still having constipation.

## 2013-04-30 DIAGNOSIS — C649 Malignant neoplasm of unspecified kidney, except renal pelvis: Secondary | ICD-10-CM | POA: Diagnosis not present

## 2013-04-30 DIAGNOSIS — N289 Disorder of kidney and ureter, unspecified: Secondary | ICD-10-CM | POA: Diagnosis not present

## 2013-05-06 DIAGNOSIS — N289 Disorder of kidney and ureter, unspecified: Secondary | ICD-10-CM | POA: Diagnosis not present

## 2013-05-27 DIAGNOSIS — C649 Malignant neoplasm of unspecified kidney, except renal pelvis: Secondary | ICD-10-CM | POA: Diagnosis not present

## 2013-06-21 DIAGNOSIS — F3189 Other bipolar disorder: Secondary | ICD-10-CM | POA: Diagnosis not present

## 2013-08-07 ENCOUNTER — Encounter (HOSPITAL_COMMUNITY): Payer: Self-pay

## 2013-08-07 ENCOUNTER — Emergency Department (HOSPITAL_COMMUNITY)
Admission: EM | Admit: 2013-08-07 | Discharge: 2013-08-07 | Disposition: A | Discharge status: Home / Self Care | Payer: Medicare Other | Attending: Emergency Medicine | Admitting: Emergency Medicine

## 2013-08-07 DIAGNOSIS — R011 Cardiac murmur, unspecified: Secondary | ICD-10-CM | POA: Diagnosis not present

## 2013-08-07 DIAGNOSIS — G44209 Tension-type headache, unspecified, not intractable: Secondary | ICD-10-CM | POA: Insufficient documentation

## 2013-08-07 DIAGNOSIS — F17200 Nicotine dependence, unspecified, uncomplicated: Secondary | ICD-10-CM | POA: Insufficient documentation

## 2013-08-07 DIAGNOSIS — Z862 Personal history of diseases of the blood and blood-forming organs and certain disorders involving the immune mechanism: Secondary | ICD-10-CM | POA: Insufficient documentation

## 2013-08-07 DIAGNOSIS — H53149 Visual discomfort, unspecified: Secondary | ICD-10-CM | POA: Diagnosis not present

## 2013-08-07 DIAGNOSIS — F319 Bipolar disorder, unspecified: Secondary | ICD-10-CM | POA: Diagnosis not present

## 2013-08-07 DIAGNOSIS — Z79899 Other long term (current) drug therapy: Secondary | ICD-10-CM | POA: Diagnosis not present

## 2013-08-07 DIAGNOSIS — Z8679 Personal history of other diseases of the circulatory system: Secondary | ICD-10-CM | POA: Diagnosis not present

## 2013-08-07 DIAGNOSIS — R519 Headache, unspecified: Secondary | ICD-10-CM

## 2013-08-07 DIAGNOSIS — K219 Gastro-esophageal reflux disease without esophagitis: Secondary | ICD-10-CM | POA: Diagnosis not present

## 2013-08-07 DIAGNOSIS — R110 Nausea: Secondary | ICD-10-CM | POA: Insufficient documentation

## 2013-08-07 DIAGNOSIS — F411 Generalized anxiety disorder: Secondary | ICD-10-CM | POA: Diagnosis not present

## 2013-08-07 DIAGNOSIS — J45909 Unspecified asthma, uncomplicated: Secondary | ICD-10-CM | POA: Insufficient documentation

## 2013-08-07 DIAGNOSIS — R51 Headache: Secondary | ICD-10-CM | POA: Diagnosis not present

## 2013-08-07 MED ORDER — ONDANSETRON 4 MG PO TBDP
4.0000 mg | ORAL_TABLET | Freq: Once | ORAL | Status: AC
  Administered 2013-08-07: 4 mg via ORAL
  Filled 2013-08-07: qty 1

## 2013-08-07 MED ORDER — HYDROCODONE-ACETAMINOPHEN 5-325 MG PO TABS: 2.0000 | ORAL_TABLET | ORAL | Status: DC | PRN

## 2013-08-07 NOTE — ED Notes (Signed)
Pt. Having a headache and lost her meds Klonozipam .  She also take Tramadol, but it is not helping.  She is having a headache.

## 2013-08-07 NOTE — ED Provider Notes (Signed)
CSN: [id number]     Arrival date & time 08/07/13  1042 History     First MD Initiated Contact with Patient 08/07/13 1158     Chief Complaint  Patient presents with  . Medication Refill    HPI  Impression complaints of headache. She is typically on clonazepam. [facility] Dr. Ladonna Snide. She states [place]. She has a new prescription. She has a week to wait "it's not time to refill it". She states she distress and anxious. When this happens she is to tension headache. She has a tension headache today. Has not resolved with multiple over-the-counter medications. No unusual features. No fevers. Mild nausea. Mild photophobia. No thunderclap onset. No vision changes. No extremity symptoms.  Past Medical History  Diagnosis Date  . Heart murmur   . Anemia   . Anxiety and depression   . Hemorrhoids   . Asthma   . Bipolar 1 disorder   . GERD (gastroesophageal reflux disease)   . Internal hemorrhoids   . Anxiety   . Depression    Past Surgical History  Procedure Laterality Date  . Abdominal hysterectomy    . Tonsillectomy    . Inguinal hernia repair    . Hemorrhoid surgery    . Tubal ligation     Family History  Problem Relation Age of Onset  . Breast cancer Maternal Aunt     x4  . Stomach cancer Maternal Aunt   . Prostate cancer Maternal Uncle     x2  . Breast cancer Maternal Grandmother   . Celiac disease Mother   . Colon cancer Neg Hx   . Esophageal cancer Neg Hx   . Rectal cancer Neg Hx    History  Substance Use Topics  . Smoking status: Current Every Day Smoker -- 0.50 packs/day    Types: Cigarettes  . Smokeless tobacco: Never Used     Comment: 1 pack every 3 days  . Alcohol Use: Yes     Comment: rare occasions   OB History   Grav Para Term Preterm Abortions TAB SAB Ect Mult Living                 Review of Systems  Constitutional: Negative for fever, chills, diaphoresis,  appetite change and fatigue.  HENT: Negative for sore throat, mouth sores and trouble swallowing.   Eyes: Negative for visual disturbance.  Respiratory: Negative for cough, chest tightness, shortness of breath and wheezing.   Cardiovascular: Negative for chest pain.  Gastrointestinal: Positive for nausea. Negative for vomiting, abdominal pain, diarrhea and abdominal distention.  Endocrine: Negative for polydipsia, polyphagia and polyuria.  Genitourinary: Negative for dysuria, frequency and hematuria.  Musculoskeletal: Negative for gait problem.  Skin: Negative for color change, pallor and rash.  Neurological: Positive for headaches. Negative for dizziness, syncope and light-headedness.  Hematological: Does not bruise/bleed easily.  Psychiatric/Behavioral: Negative for behavioral problems and confusion.    Allergies  Morphine and related  Home Medications   Current Outpatient Rx  Name  Route  Sig  Dispense  Refill  . acetaminophen (TYLENOL) 500 MG tablet   Oral   Take 1,000 mg by mouth every 6 (six) hours as needed for pain.         Marland Kitchen albuterol (PROVENTIL HFA;VENTOLIN HFA) 108 (90 BASE) MCG/ACT inhaler   Inhalation   Inhale 2 puffs into the lungs every 6 (six) hours as needed for wheezing  or shortness of breath.          Marland Kitchen aspirin-acetaminophen-caffeine (EXCEDRIN MIGRAINE) 250-250-65 MG per tablet   Oral   Take 2 tablets by mouth every 6 (six) hours as needed for pain.         . Aspirin-Salicylamide-Caffeine (BC HEADACHE POWDER PO)   Oral   Take 2 Packages by mouth daily as needed (headache).         . busPIRone (BUSPAR) 15 MG tablet   Oral   Take 30 mg by mouth 2 (two) times daily.         . clonazePAM (KLONOPIN) 1 MG tablet   Oral   Take 1 mg by mouth 2 (two) times daily.          . cyclobenzaprine (FLEXERIL) 10 MG tablet   Oral   Take 10 mg by mouth at bedtime as needed for muscle spasms.         Marland Kitchen FLUoxetine (PROZAC) 20 MG capsule   Oral   Take 80  mg by mouth daily.         Marland Kitchen lamoTRIgine (LAMICTAL) 200 MG tablet   Oral   Take 300 mg by mouth at bedtime.         Marland Kitchen omeprazole (PRILOSEC) 20 MG capsule   Oral   Take 20 mg by mouth daily.         . ondansetron (ZOFRAN-ODT) 4 MG disintegrating tablet   Oral   Take 4 mg by mouth every 6 (six) hours as needed for nausea.         Marland Kitchen QUEtiapine (SEROQUEL) 200 MG tablet   Oral   Take 400 mg by mouth at bedtime.         . traMADol (ULTRAM) 50 MG tablet   Oral   Take 50 mg by mouth every 8 (eight) hours as needed for pain.         Marland Kitchen HYDROcodone-acetaminophen (NORCO/VICODIN) 5-325 MG per tablet   Oral   Take 2 tablets by mouth every 4 (four) hours as needed for pain.   6 tablet   0    BP 129/75  Pulse 73  Temp(Src) 98.7 F (37.1 C) (Oral)  Resp 18  SpO2 97% Physical Exam  Constitutional: She is oriented to person, place, and time. She appears well-developed and well-nourished. No distress.  Sitting in a dark room. She shades her eyes.  HENT:  Head: Normocephalic.  Eyes: Conjunctivae are normal. Pupils are equal, round, and reactive to light. No scleral icterus.  Neck: Normal range of motion. Neck supple. No thyromegaly present.  Cardiovascular: Normal rate and regular rhythm.  Exam reveals no gallop and no friction rub.   No murmur heard. Pulmonary/Chest: Effort normal and breath sounds normal. No respiratory distress. She has no wheezes. She has no rales.  Abdominal: Soft. Bowel sounds are normal. She exhibits no distension. There is no tenderness. There is no rebound.  Musculoskeletal: Normal range of motion.  Neurological: She is alert and oriented to person, place, and time.  Interactive. Normal cranial nerve function. No meningismus. No findings of the suggest acute neurological syndrome.  Skin: Skin is warm and dry. No rash noted.  Psychiatric: She has a normal mood and affect. Her behavior is normal.    ED Course   Procedures (including critical care  time)  Labs Reviewed - No data to display No results found. 1. Headache     MDM  No concerning findings or history. Think this is  a some anxiety related to her work off her medications. She started florid withdrawal. Findings and history consistent with muscle tension headache. No diagnostic needed. Given a prescription for number of 6 Vicodin. Discharged home. Followup with her physicians at [facility].  Claudean Kinds, MD 08/07/13 [phone]

## 2013-08-07 NOTE — ED Notes (Signed)
Patient requested EDP to room for clarification on renewing her prescription.

## 2013-09-14 ENCOUNTER — Emergency Department (INDEPENDENT_AMBULATORY_CARE_PROVIDER_SITE_OTHER)
Admission: EM | Admit: 2013-09-14 | Discharge: 2013-09-14 | Disposition: A | Discharge status: Home / Self Care | Payer: Medicare Other | Source: Home / Self Care | Attending: Emergency Medicine | Admitting: Emergency Medicine

## 2013-09-14 ENCOUNTER — Encounter (HOSPITAL_COMMUNITY): Payer: Self-pay | Admitting: *Deleted

## 2013-09-14 DIAGNOSIS — I10 Essential (primary) hypertension: Secondary | ICD-10-CM

## 2013-09-14 DIAGNOSIS — F3189 Other bipolar disorder: Secondary | ICD-10-CM | POA: Diagnosis not present

## 2013-09-14 NOTE — ED Notes (Signed)
Pt  Reports     She  Was  Told   Her bp   Was    High    In  Doctors  Office   And  She  Was  Advised  To   Come  Here       She   Reports  Had  A  Headache  Earlier in the  Week  But is  Better  Now  -  She  Reports      Some   Anxiety  And  Reports  Recent  Stressors

## 2013-09-14 NOTE — ED Provider Notes (Signed)
Chief Complaint:   Chief Complaint  Patient presents with  . Hypertension    History of Present Illness:   Jasmine Jackson is a 42 year old female who comes in today for blood pressure recheck. She was at her mental health provider and blood pressure was found to be 180/90. She denies any headaches, dizziness, blurry vision, chest pain, shortness of breath, or ankle swelling or pain. She has not had any history of diabetes, elevated cholesterol, or cigarette smoking. She's had no prior history of high blood pressure and is not taking any medications for her blood pressure.  Review of Systems:  Other than noted above, the patient denies any of the following symptoms: Systemic:  No fever, chills, fatigue, weight loss or gain. Respiratory:  No coughing, wheezing, or shortness of breath. Cardiac:  No chest pain, tightness, pressure, palpitations, syncope, or edema. Neuro:  No headache, dizziness, blurred vision, weakness, paresthesias, or strokelike symptoms.  PMFSH:  Past medical history, family history, social history, meds, and allergies were reviewed. She does have a history of a renal mass which is being followed by Dr. Isabel Caprice. She has asthma and bipolar disorder. She's on a number of medications including omeprazole, Lamictal, Prozac, Seroquel, BuSpar, and pro-air.  Physical Exam:   Vital signs:  BP 143/70  Pulse 62  Temp(Src) 98.6 F (37 C) (Oral)  Resp 14  SpO2 100% General:  Alert, oriented, in no distress. Lungs:  Breath sounds clear and equal bilaterally.  No wheezes, rales, or rhonchi. Heart:  Regular rhythm, no gallops, murmers, clicks or rubs.  Abdomen:  Soft and flat.  Nontender, no organomegaly or mass.  No pulsatile midline abdominal mass or bruit. Ext:  No edema, pulses full. Neurological exam:  Alert and oriented.  Speech is clear.  No pronator drift.  CNs intact.   Assessment:  The encounter diagnosis was Hypertension.  Her blood pressure is barely above normal right  now. I suggested she monitor her blood pressure at home and record values. She should followup with her primary care physician within the next 2 weeks. I had a long talk with her about sodium restriction, exercise, and weight control. She does not need medications at this time.  Plan:   1.  Meds:  The following meds were prescribed:   Discharge Medication List as of 09/14/2013 12:33 PM      2.  Patient Education/Counseling:  The patient was given appropriate handouts, self care instructions, and instructed in symptomatic relief. Specifically discussed salt and sodium restriction, weight control, and exercise.  3.  Follow up:  The patient was told to follow up if no better in 3 to 4 days, if becoming worse in any way, and given some red flag symptoms such as headache, chest pain, or shortness of breath which would prompt immediate return.  Follow up with her primary care physician, Dr. Cain Saupe.     Reuben Likes, MD 09/14/13 [id number]

## 2013-10-22 DIAGNOSIS — F3189 Other bipolar disorder: Secondary | ICD-10-CM | POA: Diagnosis not present

## 2013-11-04 ENCOUNTER — Other Ambulatory Visit (HOSPITAL_COMMUNITY): Payer: Self-pay | Admitting: Urology

## 2013-11-04 DIAGNOSIS — C649 Malignant neoplasm of unspecified kidney, except renal pelvis: Secondary | ICD-10-CM

## 2013-11-23 ENCOUNTER — Ambulatory Visit (HOSPITAL_COMMUNITY)
Admission: RE | Admit: 2013-11-23 | Discharge: 2013-11-23 | Disposition: A | Discharge status: Home / Self Care | Payer: Medicare Other | Source: Ambulatory Visit | Attending: Urology | Admitting: Urology

## 2013-11-23 DIAGNOSIS — N281 Cyst of kidney, acquired: Secondary | ICD-10-CM | POA: Insufficient documentation

## 2013-11-23 DIAGNOSIS — C649 Malignant neoplasm of unspecified kidney, except renal pelvis: Secondary | ICD-10-CM

## 2013-11-23 LAB — CREATININE, SERUM
Creatinine, Ser: 0.92 mg/dL (ref 0.50–1.10)
GFR calc Af Amer: 87 mL/min — ABNORMAL LOW (ref >90)
GFR calc non Af Amer: 75 mL/min — ABNORMAL LOW (ref >90)

## 2013-11-23 MED ORDER — GADOBENATE DIMEGLUMINE 529 MG/ML IV SOLN
20.0000 mL | Freq: Once | INTRAVENOUS | Status: AC | PRN
  Administered 2013-11-23: 17 mL via INTRAVENOUS

## 2013-11-26 DIAGNOSIS — C649 Malignant neoplasm of unspecified kidney, except renal pelvis: Secondary | ICD-10-CM | POA: Diagnosis not present

## 2013-11-26 DIAGNOSIS — N281 Cyst of kidney, acquired: Secondary | ICD-10-CM | POA: Diagnosis not present

## 2013-12-03 DIAGNOSIS — F3189 Other bipolar disorder: Secondary | ICD-10-CM | POA: Diagnosis not present

## 2013-12-08 ENCOUNTER — Telehealth: Payer: Self-pay | Admitting: Gastroenterology

## 2013-12-08 ENCOUNTER — Ambulatory Visit (INDEPENDENT_AMBULATORY_CARE_PROVIDER_SITE_OTHER): Payer: Medicare Other | Admitting: Gastroenterology

## 2013-12-08 ENCOUNTER — Encounter: Payer: Self-pay | Admitting: Gastroenterology

## 2013-12-08 VITALS — BP 140/72 | HR 72 | Ht 66.5 in | Wt 185.0 lb

## 2013-12-08 DIAGNOSIS — K589 Irritable bowel syndrome without diarrhea: Secondary | ICD-10-CM

## 2013-12-08 DIAGNOSIS — K219 Gastro-esophageal reflux disease without esophagitis: Secondary | ICD-10-CM

## 2013-12-08 DIAGNOSIS — K648 Other hemorrhoids: Secondary | ICD-10-CM

## 2013-12-08 DIAGNOSIS — R1013 Epigastric pain: Secondary | ICD-10-CM | POA: Insufficient documentation

## 2013-12-08 DIAGNOSIS — K642 Third degree hemorrhoids: Secondary | ICD-10-CM | POA: Insufficient documentation

## 2013-12-08 MED ORDER — OMEPRAZOLE 40 MG PO CPDR: 40.0000 mg | DELAYED_RELEASE_CAPSULE | Freq: Two times a day (BID) | ORAL | Status: DC

## 2013-12-08 MED ORDER — HYOSCYAMINE SULFATE 0.125 MG SL SUBL: SUBLINGUAL_TABLET | SUBLINGUAL | Status: DC

## 2013-12-08 MED ORDER — HYDROCORTISONE ACETATE 25 MG RE SUPP: RECTAL | Status: DC

## 2013-12-08 MED ORDER — DICYCLOMINE HCL 10 MG PO CAPS: 10.0000 mg | ORAL_CAPSULE | Freq: Three times a day (TID) | ORAL | Status: DC

## 2013-12-08 MED ORDER — HYDROCORTISONE 2.5 % RE CREA: 1.0000 "application " | TOPICAL_CREAM | Freq: Two times a day (BID) | RECTAL | Status: DC

## 2013-12-08 NOTE — Telephone Encounter (Signed)
Patient states these medications are too expensive. Asked Dr. Russella Dar if we can switch medications to preferred medications by her insurance company. Dr. Russella Dar agreed to switch patient to Bentyl and another cream that could be inserted similar to Anusol suppositories. Told patient I am going to send in Proctosol cream instead but to ask her pharmacist if this can be inserted inside the rectum also. Pt agreed.

## 2013-12-08 NOTE — Progress Notes (Signed)
    History of Present Illness: This is a 42 year old female who underwent evaluation in April for nausea, vomiting, abdominal pain, abdominal bloating, rectal bleeding, weight loss. Colonoscopy was performed in April showing moderate internal hemorrhoids. EGD performed in April was normal. She was felt to have IBS, GERD and hemorrhoidal bleeding. She has ongoing heartburn and reflux symptoms as well as intermittent regurgitation with nausea and vomiting she relates frequent generalized abdominal bloating and gas. She has mild constipation. She notes frequent) blood per rectum.  Current Medications, Allergies, Past Medical History, Past Surgical History, Family History and Social History were reviewed in [employer] record.  Physical Exam: General: Well developed , well nourished, no acute distress Head: Normocephalic and atraumatic Eyes:  sclerae anicteric, EOMI Ears: Normal auditory acuity Mouth: No deformity or lesions Lungs: Clear throughout to auscultation Heart: Regular rate and rhythm; no murmurs, rubs or bruits Abdomen: Soft, non tender and non distended. No masses, hepatosplenomegaly or hernias noted. Normal Bowel sounds Musculoskeletal: Symmetrical with no gross deformities  Pulses:  Normal pulses noted Extremities: No clubbing, cyanosis, edema or deformities noted Neurological: Alert oriented x 4, grossly nonfocal Psychological:  Alert and cooperative. Normal mood and affect  Assessment and Recommendations:  1. Moderate-sized internal hemorrhoids. Mild constipation management as below. Begin Anusol-HC suppositories twice a day for one week and then daily for one more week and then when necessary. Consider hemorrhoidal sclerosis, hemorrhoidal  banding or surgical referral if the above measures are not effective.  2. GERD. Intensify antireflux measures. Increase omeprazole 40 mg twice a day.  3. IBS with bloating and mild constipation. Symptoms probably  exacerbated by her medications. Begin MiraLax once or twice daily titrated for adequate  daily bowel movements. Begin hyoscyamine every 4 hours as needed for abdominal pain and bloating.

## 2013-12-08 NOTE — Patient Instructions (Addendum)
Start over the counter Miralax mixing 17 grams in 8 oz of water 1-2 x daily long-term for constipation.  We have increased your omeprazole to 40 mg twice daily. A new prescription has been sent to your pharmacy.   We have sent the following medications to your pharmacy for you to pick up at your convenience: Anusol suppositories, omeprazole and Levsin.  Patient advised to avoid spicy, acidic, citrus, chocolate, mints, fruit and fruit juices.  Limit the intake of caffeine, alcohol and Soda.  Don't exercise too soon after eating.  Don't lie down within 3-4 hours of eating.  Elevate the head of your bed.  If you decide to call to schedule a new patient appointment with one of our [facility] Physicians, the phone number is [phone].  Thank you for choosing me and [facility].  [name]. Pleas Koch., MD., Clementeen Graham

## 2013-12-13 ENCOUNTER — Telehealth: Payer: Self-pay | Admitting: Gastroenterology

## 2013-12-13 MED ORDER — HYDROCORTISONE ACE-PRAMOXINE 2.5-1 % RE CREA: 1.0000 "application " | TOPICAL_CREAM | Freq: Three times a day (TID) | RECTAL | Status: DC

## 2013-12-13 NOTE — Telephone Encounter (Signed)
Informed patient that I am not sure if the Analpram is covered by insurance but can sent it to her pharmacy in the Proctosol's place. Patient states the Proctosol's cream is not helping her rectal bleeding. Told patient to get a generic hydrocortisone suppository over the counter to use in combination with the Analpram. Pt agreed and will call back if this does not help.

## 2014-02-17 ENCOUNTER — Other Ambulatory Visit: Payer: Self-pay

## 2014-02-17 DIAGNOSIS — Z1231 Encounter for screening mammogram for malignant neoplasm of breast: Secondary | ICD-10-CM

## 2014-03-01 ENCOUNTER — Encounter: Payer: Self-pay | Admitting: Physician Assistant

## 2014-03-01 ENCOUNTER — Other Ambulatory Visit (INDEPENDENT_AMBULATORY_CARE_PROVIDER_SITE_OTHER): Payer: Medicare Other

## 2014-03-01 ENCOUNTER — Ambulatory Visit (INDEPENDENT_AMBULATORY_CARE_PROVIDER_SITE_OTHER)
Admission: RE | Admit: 2014-03-01 | Discharge: 2014-03-01 | Disposition: A | Discharge status: Home / Self Care | Payer: Medicare Other | Source: Ambulatory Visit | Attending: Physician Assistant | Admitting: Physician Assistant

## 2014-03-01 ENCOUNTER — Ambulatory Visit (INDEPENDENT_AMBULATORY_CARE_PROVIDER_SITE_OTHER): Payer: Medicare Other | Admitting: Physician Assistant

## 2014-03-01 VITALS — BP 136/92 | HR 84 | Temp 98.3°F | Resp 16 | Ht 65.0 in | Wt 193.1 lb

## 2014-03-01 DIAGNOSIS — Z0000 Encounter for general adult medical examination without abnormal findings: Secondary | ICD-10-CM | POA: Diagnosis not present

## 2014-03-01 DIAGNOSIS — M549 Dorsalgia, unspecified: Secondary | ICD-10-CM

## 2014-03-01 DIAGNOSIS — M545 Low back pain, unspecified: Secondary | ICD-10-CM | POA: Diagnosis not present

## 2014-03-01 DIAGNOSIS — J45909 Unspecified asthma, uncomplicated: Secondary | ICD-10-CM

## 2014-03-01 DIAGNOSIS — K219 Gastro-esophageal reflux disease without esophagitis: Secondary | ICD-10-CM

## 2014-03-01 DIAGNOSIS — K589 Irritable bowel syndrome without diarrhea: Secondary | ICD-10-CM

## 2014-03-01 LAB — CBC WITH DIFFERENTIAL/PLATELET
Basophils Absolute: 0.1 10*3/uL (ref 0.0–0.1)
Basophils Relative: 0.7 % (ref 0.0–3.0)
EOS PCT: 1.1 % (ref 0.0–5.0)
Eosinophils Absolute: 0.1 10*3/uL (ref 0.0–0.7)
HEMATOCRIT: 38 % (ref 36.0–46.0)
HEMOGLOBIN: 12.7 g/dL (ref 12.0–15.0)
LYMPHS ABS: 2.5 10*3/uL (ref 0.7–4.0)
Lymphocytes Relative: 32.8 % (ref 12.0–46.0)
MCHC: 33.5 g/dL (ref 30.0–36.0)
MCV: 89.9 fl (ref 78.0–100.0)
MONOS PCT: 5.8 % (ref 3.0–12.0)
Monocytes Absolute: 0.4 10*3/uL (ref 0.1–1.0)
NEUTROS ABS: 4.5 10*3/uL (ref 1.4–7.7)
Neutrophils Relative %: 59.6 % (ref 43.0–77.0)
Platelets: 322 10*3/uL (ref 150.0–400.0)
RBC: 4.23 Mil/uL (ref 3.87–5.11)
RDW: 14 % (ref 11.5–14.6)
WBC: 7.6 10*3/uL (ref 4.5–10.5)

## 2014-03-01 LAB — BASIC METABOLIC PANEL
BUN: 15 mg/dL (ref 6–23)
CHLORIDE: 104 meq/L (ref 96–112)
CO2: 29 meq/L (ref 19–32)
Calcium: 9.6 mg/dL (ref 8.4–10.5)
Creatinine, Ser: 0.7 mg/dL (ref 0.4–1.2)
GFR: 113.35 mL/min (ref >60.00)
Glucose, Bld: 88 mg/dL (ref 70–99)
Potassium: 3.9 mEq/L (ref 3.5–5.1)
SODIUM: 139 meq/L (ref 135–145)

## 2014-03-01 LAB — URINALYSIS, ROUTINE W REFLEX MICROSCOPIC
Bilirubin Urine: NEGATIVE
KETONES UR: NEGATIVE
Leukocytes, UA: NEGATIVE
NITRITE: NEGATIVE
PH: 7.5 (ref 5.0–8.0)
Specific Gravity, Urine: 1.02 (ref 1.000–1.030)
Total Protein, Urine: NEGATIVE
Urine Glucose: NEGATIVE
Urobilinogen, UA: 0.2 (ref 0.0–1.0)
WBC, UA: NONE SEEN (ref 0–?)

## 2014-03-01 LAB — HEPATIC FUNCTION PANEL
ALBUMIN: 4.5 g/dL (ref 3.5–5.2)
ALT: 23 U/L (ref 0–35)
AST: 16 U/L (ref 0–37)
Alkaline Phosphatase: 65 U/L (ref 39–117)
BILIRUBIN TOTAL: 0.3 mg/dL (ref 0.3–1.2)
Bilirubin, Direct: 0.1 mg/dL (ref 0.0–0.3)
Total Protein: 7.8 g/dL (ref 6.0–8.3)

## 2014-03-01 MED ORDER — ONDANSETRON 4 MG PO TBDP: 4.0000 mg | ORAL_TABLET | Freq: Four times a day (QID) | ORAL | Status: DC | PRN

## 2014-03-01 MED ORDER — METHYLPREDNISOLONE 4 MG PO KIT: PACK | ORAL | Status: DC

## 2014-03-01 NOTE — Progress Notes (Signed)
Pre visit review using our clinic review tool, if applicable. No additional management support is needed unless otherwise documented below in the visit note. 

## 2014-03-01 NOTE — Patient Instructions (Signed)
It was great to meet you today Jasmine Jackson!   Labs have been ordered for you, when you report to lab please be fasting.   I have sent prescriptions to your pharmacy, please take as directed.   If no improvement of symptoms or symptoms become worse, please return to office for further evaluation.Back Pain, Adult Back pain is very common. The pain often gets better over time. The cause of back pain is usually not dangerous. Most people can learn to manage their back pain on their own.  HOME CARE   Stay active. Start with short walks on flat ground if you can. Try to walk farther each day.  Do not sit, drive, or stand in one place for more than 30 minutes. Do not stay in bed.  Do not avoid exercise or work. Activity can help your back heal faster.  Be careful when you bend or lift an object. Bend at your knees, keep the object close to you, and do not twist.  Sleep on a firm mattress. Lie on your side, and bend your knees. If you lie on your back, put a pillow under your knees.  Only take medicines as told by your doctor.  Put ice on the injured area.  Put ice in a plastic bag.  Place a towel between your skin and the bag.  Leave the ice on for 15-20 minutes, 03-04 times a day for the first 2 to 3 days. After that, you can switch between ice and heat packs.  Ask your doctor about back exercises or massage.  Avoid feeling anxious or stressed. Find good ways to deal with stress, such as exercise. GET HELP RIGHT AWAY IF:   Your pain does not go away with rest or medicine.  Your pain does not go away in 1 week.  You have new problems.  You do not feel well.  The pain spreads into your legs.  You cannot control when you poop (bowel movement) or pee (urinate).  Your arms or legs feel weak or lose feeling (numbness).  You feel sick to your stomach (nauseous) or throw up (vomit).  You have belly (abdominal) pain.  You feel like you may pass out (faint). MAKE SURE YOU:    Understand these instructions.  Will watch your condition.  Will get help right away if you are not doing well or get worse. Document Released: 05/27/2008 Document Revised: 03/02/2012 Document Reviewed: 04/29/2011 [facility] Patient Information 2014 [place]. Health Maintenance, Female A healthy lifestyle and preventative care can promote health and wellness.  Maintain regular health, dental, and eye exams.  Eat a healthy diet. Foods like vegetables, fruits, whole grains, low-fat dairy products, and lean protein foods contain the nutrients you need without too many calories. Decrease your intake of foods high in solid fats, added sugars, and salt. Get information about a proper diet from your caregiver, if necessary.  Regular physical exercise is one of the most important things you can do for your health. Most adults should get at least 150 minutes of moderate-intensity exercise (any activity that increases your heart rate and causes you to sweat) each week. In addition, most adults need muscle-strengthening exercises on 2 or more days a week.   Maintain a healthy weight. The body mass index (BMI) is a screening tool to identify possible weight problems. It provides an estimate of body fat based on height and weight. Your caregiver can help determine your BMI, and can help you achieve or maintain a  healthy weight. For adults 20 years and older:  A BMI below 18.5 is considered underweight.  A BMI of 18.5 to 24.9 is normal.  A BMI of 25 to 29.9 is considered overweight.  A BMI of 30 and above is considered obese.  Maintain normal blood lipids and cholesterol by exercising and minimizing your intake of saturated fat. Eat a balanced diet with plenty of fruits and vegetables. Blood tests for lipids and cholesterol should begin at age 34 and be repeated every 5 years. If your lipid or cholesterol levels are high, you are over 50, or you are a high risk for heart disease, you may need  your cholesterol levels checked more frequently.Ongoing high lipid and cholesterol levels should be treated with medicines if diet and exercise are not effective.  If you smoke, find out from your caregiver how to quit. If you do not use tobacco, do not start.  Lung cancer screening is recommended for adults aged 52 80 years who are at high risk for developing lung cancer because of a history of smoking. Yearly low-dose computed tomography (CT) is recommended for people who have at least a 30-pack-year history of smoking and are a current smoker or have quit within the past 15 years. A pack year of smoking is smoking an average of 1 pack of cigarettes a day for 1 year (for example: 1 pack a day for 30 years or 2 packs a day for 15 years). Yearly screening should continue until the smoker has stopped smoking for at least 15 years. Yearly screening should also be stopped for people who develop a health problem that would prevent them from having lung cancer treatment.  If you are pregnant, do not drink alcohol. If you are breastfeeding, be very cautious about drinking alcohol. If you are not pregnant and choose to drink alcohol, do not exceed 1 drink per day. One drink is considered to be 12 ounces (355 mL) of beer, 5 ounces (148 mL) of wine, or 1.5 ounces (44 mL) of liquor.  Avoid use of street drugs. Do not share needles with anyone. Ask for help if you need support or instructions about stopping the use of drugs.  High blood pressure causes heart disease and increases the risk of stroke. Blood pressure should be checked at least every 1 to 2 years. Ongoing high blood pressure should be treated with medicines, if weight loss and exercise are not effective.  If you are 9 to 44 years old, ask your caregiver if you should take aspirin to prevent strokes.  Diabetes screening involves taking a blood sample to check your fasting blood sugar level. This should be done once every 3 years, after age 74, if  you are within normal weight and without risk factors for diabetes. Testing should be considered at a younger age or be carried out more frequently if you are overweight and have at least 1 risk factor for diabetes.  Breast cancer screening is essential preventative care for women. You should practice "breast self-awareness." This means understanding the normal appearance and feel of your breasts and may include breast self-examination. Any changes detected, no matter how small, should be reported to a caregiver. Women in their 19s and 30s should have a clinical breast exam (CBE) by a caregiver as part of a regular health exam every 1 to 3 years. After age 73, women should have a CBE every year. Starting at age 58, women should consider having a mammogram (breast X-ray) every year.  Women who have a family history of breast cancer should talk to their caregiver about genetic screening. Women at a high risk of breast cancer should talk to their caregiver about having an MRI and a mammogram every year.  Breast cancer gene (BRCA)-related cancer risk assessment is recommended for women who have family members with BRCA-related cancers. BRCA-related cancers include breast, ovarian, tubal, and peritoneal cancers. Having family members with these cancers may be associated with an increased risk for harmful changes (mutations) in the breast cancer genes BRCA1 and BRCA2. Results of the assessment will determine the need for genetic counseling and BRCA1 and BRCA2 testing.  The Pap test is a screening test for cervical cancer. Women should have a Pap test starting at age 62. Between ages 38 and 36, Pap tests should be repeated every 2 years. Beginning at age 61, you should have a Pap test every 3 years as long as the past 3 Pap tests have been normal. If you had a hysterectomy for a problem that was not cancer or a condition that could lead to cancer, then you no longer need Pap tests. If you are between ages 12 and 50,  and you have had normal Pap tests going back 10 years, you no longer need Pap tests. If you have had past treatment for cervical cancer or a condition that could lead to cancer, you need Pap tests and screening for cancer for at least 20 years after your treatment. If Pap tests have been discontinued, risk factors (such as a new sexual partner) need to be reassessed to determine if screening should be resumed. Some women have medical problems that increase the chance of getting cervical cancer. In these cases, your caregiver may recommend more frequent screening and Pap tests.  The human papillomavirus (HPV) test is an additional test that may be used for cervical cancer screening. The HPV test looks for the virus that can cause the cell changes on the cervix. The cells collected during the Pap test can be tested for HPV. The HPV test could be used to screen women aged 24 years and older, and should be used in women of any age who have unclear Pap test results. After the age of 5, women should have HPV testing at the same frequency as a Pap test.  Colorectal cancer can be detected and often prevented. Most routine colorectal cancer screening begins at the age of 90 and continues through age 72. However, your caregiver may recommend screening at an earlier age if you have risk factors for colon cancer. On a yearly basis, your caregiver may provide home test kits to check for hidden blood in the stool. Use of a small camera at the end of a tube, to directly examine the colon (sigmoidoscopy or colonoscopy), can detect the earliest forms of colorectal cancer. Talk to your caregiver about this at age 29, when routine screening begins. Direct examination of the colon should be repeated every 5 to 10 years through age 51, unless early forms of pre-cancerous polyps or small growths are found.  Hepatitis C blood testing is recommended for all people born from [birth year] through 1966 and any individual with known risks for  hepatitis C.  Practice safe sex. Use condoms and avoid high-risk sexual practices to reduce the spread of sexually transmitted infections (STIs). Sexually active women aged 68 and younger should be checked for Chlamydia, which is a common sexually transmitted infection. Older women with new or multiple partners should also be tested  for Chlamydia. Testing for other STIs is recommended if you are sexually active and at increased risk.  Osteoporosis is a disease in which the bones lose minerals and strength with aging. This can result in serious bone fractures. The risk of osteoporosis can be identified using a bone density scan. Women ages 79 and over and women at risk for fractures or osteoporosis should discuss screening with their caregivers. Ask your caregiver whether you should be taking a calcium supplement or vitamin D to reduce the rate of osteoporosis.  Menopause can be associated with physical symptoms and risks. Hormone replacement therapy is available to decrease symptoms and risks. You should talk to your caregiver about whether hormone replacement therapy is right for you.  Use sunscreen. Apply sunscreen liberally and repeatedly throughout the day. You should seek shade when your shadow is shorter than you. Protect yourself by wearing long sleeves, pants, a wide-brimmed hat, and sunglasses year round, whenever you are outdoors.  Notify your caregiver of new moles or changes in moles, especially if there is a change in shape or color. Also notify your caregiver if a mole is larger than the size of a pencil eraser.  Stay current with your immunizations. Document Released: 06/24/2011 Document Revised: 04/05/2013 Document Reviewed: 06/24/2011 [facility] Patient Information 2014 [place].

## 2014-03-01 NOTE — Progress Notes (Signed)
Spoke with pt advised of result note.   

## 2014-03-01 NOTE — Progress Notes (Signed)
Patient ID: Jasmine Jackson is a 43 y.o. female DOB: [phone] MRN: [id number]     HPI:  Patient is a 43 year old female who present to the office to establish care. Reports a history of anxiety, depression and bipolar disease is treated with Buspar, Neurontin, Lamotrigine, Seroquel, Klonopin and Prozac. Patient is treated by Dr Kenton Kingfisher at [facility]. History of IBS treated with Bentyl, Zofran and Flexeril, follows with Dr. Fuller Plan, gastroenterology. GERD treated with Omeprazole, Asthma treated with Albuterol as needed. Reports has had back pain in the past which has been treated with Tramadol. Today presents with new onset back pain, started three days PTA, similar to her pain in the past. Is on the right side and pain radiates down the front and back of her right leg to just below the knee. Describes pain as cramping. Reports is worse when sitting has to stand up to help alleviate pain.  Has had no injury or trauma. Denies saddle anesthesia, numbness, tingling or weakness of the bilateral lower extremities. Denies chest pain/palpitaions, SOB, cough, extremity swelling, vomiting, fever, pain/difficulty swallowing, visual change/disturbance. Patient reports uncertain if partial or complete hysterectomy.  Influenza: not this year Tetanus: unknown PAP: 2002 LMP: hysterectomy Mammogram: 2014    ROS: As stated in HPI. All other systems negative  Past Medical History  Diagnosis Date  . Heart murmur   . Anemia   . Anxiety and depression   . Hemorrhoids   . Asthma   . Bipolar 1 disorder   . GERD (gastroesophageal reflux disease)   . Internal hemorrhoids   . Anxiety   . Depression   . Hiatal hernia    Family History  Problem Relation Age of Onset  . Breast cancer Maternal Aunt     x4  . Stomach cancer Maternal Aunt   . Prostate cancer Maternal Uncle     x2  . Breast cancer Maternal Grandmother   . Celiac disease Mother   . Colon cancer Neg Hx   . Esophageal cancer Neg Hx     . Rectal cancer Neg Hx    History   Social History  . Marital Status: Single    Spouse Name: N/A    Number of Children: 1  . Years of Education: N/A   Occupational History  . Disability    Social History Main Topics  . Smoking status: Current Every Day Smoker -- 0.50 packs/day    Types: Cigarettes  . Smokeless tobacco: Never Used     Comment: 1 pack every 3 days  . Alcohol Use: Yes     Comment: rare occasions  . Drug Use: No  . Sexual Activity: None   Other Topics Concern  . None   Social History Narrative  . None   Past Surgical History  Procedure Laterality Date  . Abdominal hysterectomy    . Tonsillectomy    . Inguinal hernia repair    . Hemorrhoid surgery    . Tubal ligation     Current Outpatient Prescriptions on File Prior to Visit  Medication Sig Dispense Refill  . albuterol (PROVENTIL HFA;VENTOLIN HFA) 108 (90 BASE) MCG/ACT inhaler Inhale 2 puffs into the lungs every 6 (six) hours as needed for wheezing or shortness of breath.       . busPIRone (BUSPAR) 15 MG tablet Take 30 mg by mouth 2 (two) times daily.      . clonazePAM (KLONOPIN) 1 MG tablet Take 1 mg by mouth 2 (two) times daily.       Marland Kitchen  cyclobenzaprine (FLEXERIL) 10 MG tablet Take 10 mg by mouth at bedtime as needed for muscle spasms.      Marland Kitchen dicyclomine (BENTYL) 10 MG capsule Take 1 capsule (10 mg total) by mouth 4 (four) times daily -  before meals and at bedtime.  120 capsule  11  . FLUoxetine (PROZAC) 20 MG capsule Take 80 mg by mouth daily.      Marland Kitchen gabapentin (NEURONTIN) 100 MG capsule Takes three times daily      . hydrocortisone-pramoxine (ANALPRAM-HC) 2.5-1 % rectal cream Place 1 application rectally 3 (three) times daily.  30 g  1  . lamoTRIgine (LAMICTAL) 200 MG tablet Take 300 mg by mouth at bedtime.      Marland Kitchen omeprazole (PRILOSEC) 40 MG capsule Take 1 capsule (40 mg total) by mouth 2 (two) times daily.  180 capsule  3  . QUEtiapine (SEROQUEL) 200 MG tablet Take 400 mg by mouth at bedtime.       . Starch (TUCKS RE) Place rectally as directed.      . traMADol (ULTRAM) 50 MG tablet Take 50 mg by mouth every 8 (eight) hours as needed for pain.       No current facility-administered medications on file prior to visit.   Allergies  Allergen Reactions  . Morphine And Related Itching    PE:  Filed Vitals:   03/01/14 1033  BP: 136/92  Pulse: 84  Temp: 98.3 F (36.8 C)  Resp: 16    CONSTITUTIONAL: Well developed, well nourished, pleasant, appears stated age, in NAD HEENT: normocephalic, atraumatic, bilateral ext/int canals normal. Bilateral TM's without injections, bulging, erythema. Nose normal, uvula midline, oropharynx clear and moist. EYES: PERRLA, bilateral EOM and conjunctiva normal NECK: FROM, supple, without thyromegaly or mass CARDIO: RRR, normal S1 and S2, distal pulses intact. PULM/CHEST CTA bilateral, no wheezes, rales or rhonchi. Non tender. ABD: appearance normal, soft, nontender. Normal bowel sounds x 4 quadrants, non palpable liver, spleen, kidney. GU: deferred to GYN. MUSC: FROM U/LE bilateral, FROM of thoracic and lumbar spine, mild midline tenderness of lumbar spine. No mass or spasm noted. Negative straight leg raise. DTR's are symmetrically intact. Sensation intact in all dermatomes of the lower extremities. Full strength to manual muscle testing. patient is unable to heel toe walk without difficulty secondary to pain,gait is antalgic. LYMPH: no cervical, supraclavicular adenopathy NEURO: alert and oriented x 3, no cranial nerve deficit, motor strength and coordination NL. Negative romberg.  SKIN: warm, dry, no rash or lesions noted. PSYCH: Mood and affect normal, speech normal.    ASSESSMENT and PLAN   CPX/v70.0 - Patient has been counseled on age-appropriate routine health concerns for screening and prevention. These are reviewed and up-to-date. Immunizations are up-to-date or declined. Labs ordered and will be reviewed.  HM: Referral to  GYN  IBS: Continue on current medications  Reflux: Continue on omeprazole  Asthma Continue with current albuterol inhaler as needed  Back pain Will continue use of flexeril Rx for Medrol dose pack today Xray lumbar spine today Instructed to RTO if no improvement of symptoms Possible referral to sports medicine or MRI  Anxiety/Depression/bipolor Continue on current medications Keep scheduled appointment with [facility] mental health

## 2014-03-02 ENCOUNTER — Ambulatory Visit (INDEPENDENT_AMBULATORY_CARE_PROVIDER_SITE_OTHER): Payer: Medicare Other

## 2014-03-02 VITALS — BP 138/82

## 2014-03-02 DIAGNOSIS — I10 Essential (primary) hypertension: Secondary | ICD-10-CM | POA: Diagnosis not present

## 2014-03-07 ENCOUNTER — Telehealth: Payer: Self-pay | Admitting: Physician Assistant

## 2014-03-07 ENCOUNTER — Other Ambulatory Visit: Payer: Self-pay | Admitting: Physician Assistant

## 2014-03-07 ENCOUNTER — Telehealth: Payer: Self-pay | Admitting: Gastroenterology

## 2014-03-07 ENCOUNTER — Ambulatory Visit (INDEPENDENT_AMBULATORY_CARE_PROVIDER_SITE_OTHER): Payer: Medicare Other | Admitting: Gastroenterology

## 2014-03-07 ENCOUNTER — Encounter: Payer: Self-pay | Admitting: Gastroenterology

## 2014-03-07 VITALS — BP 120/80 | HR 76 | Ht 66.5 in | Wt 191.2 lb

## 2014-03-07 DIAGNOSIS — R1012 Left upper quadrant pain: Secondary | ICD-10-CM

## 2014-03-07 DIAGNOSIS — M549 Dorsalgia, unspecified: Secondary | ICD-10-CM

## 2014-03-07 MED ORDER — TRAMADOL HCL 50 MG PO TABS: 50.0000 mg | ORAL_TABLET | Freq: Three times a day (TID) | ORAL | Status: DC | PRN

## 2014-03-07 NOTE — Telephone Encounter (Signed)
Patient is requesting tramadol.

## 2014-03-07 NOTE — Telephone Encounter (Signed)
Have prescription sitting up front for her. Will need to schedule follow up appointment with Dr. Charlann Boxer.

## 2014-03-07 NOTE — Telephone Encounter (Signed)
Pt states she has been having left sided abdominal pain for the last 3 days. States the pain is above her belly button and the area is swollen. Pt scheduled to see Dr. Fuller Plan today at 2pm. Pt aware of appt date and time.

## 2014-03-07 NOTE — Patient Instructions (Addendum)
You have been given a separate informational sheet regarding your tobacco use, the importance of quitting and local resources to help you quit.  Take your Flexeril twice daily as needed for muscle pain. Use a heating pad for that area.  Also take Advil as needed.   Follow up with PCP for further musculoskeletal pain.  Thank you for choosing me and [facility].  Pricilla Riffle. Dagoberto Ligas., MD., Marval Regal

## 2014-03-07 NOTE — Progress Notes (Signed)
    History of Present Illness: This is a 43 year old female with a history of irritable bowel syndrome, GERD and internal hemorrhoids. She notes intermittent pain along her left costochondral margin is not related to meals or bowel movements. An abdominal MR in 11/2013 did not show any GI findings. She underwent upper endoscopy and colonoscopy in 03/2013. Abdominal ultrasound in 03/2103 did not show any GI findings.   Current Medications, Allergies, Past Medical History, Past Surgical History, Family History and Social History were reviewed in [employer] record.  Physical Exam: General: Well developed , well nourished, no acute distress Head: Normocephalic and atraumatic Eyes:  sclerae anicteric, EOMI Ears: Normal auditory acuity Mouth: No deformity or lesions Lungs: Clear throughout to auscultation Heart: Regular rate and rhythm; no murmurs, rubs or bruits Abdomen: Soft, mild tenderness along left costochondral margin and non distended. No masses, hepatosplenomegaly or hernias noted. Normal Bowel sounds Musculoskeletal: Symmetrical with no gross deformities  Pulses:  Normal pulses noted Extremities: No clubbing, cyanosis, edema or deformities noted Neurological: Alert oriented x 4, grossly nonfocal Psychological:  Alert and cooperative. Normal mood and affect  Assessment and Recommendations:  1. Left costochondral margin pain. Suspect musculoskeletal pain. Flexeril twice a day when necessary. Advil 400-600 mg 3 times a day when necessary and heating pad when necessary. Followup with PCP as needed.  2. Irritable bowel syndrome, well controlled. Continue dicyclomine 10 mg a.c. and at bedtime.   3. GERD, well controlled. Continue omeprazole 40 mg daily and standard antireflux measures.

## 2014-03-07 NOTE — Telephone Encounter (Signed)
Patient is requesting tramadol.  Please advice.

## 2014-03-08 NOTE — Telephone Encounter (Signed)
Spoke with patient.  Informed her that her script for Tramadol was ready to pick up and that she needed to schedule an appt with Dr. Tamala Julian when she picked up script.  I noted this on the envelope.

## 2014-03-14 ENCOUNTER — Telehealth: Payer: Self-pay

## 2014-03-14 ENCOUNTER — Ambulatory Visit: Payer: Medicare Other

## 2014-03-14 MED ORDER — CYCLOBENZAPRINE HCL 10 MG PO TABS: 10.0000 mg | ORAL_TABLET | Freq: Every evening | ORAL | Status: DC | PRN

## 2014-03-14 NOTE — Telephone Encounter (Signed)
rx refill request for flexeril 10 mg. Do you want this med refilled?

## 2014-03-14 NOTE — Telephone Encounter (Signed)
Medication printed

## 2014-03-14 NOTE — Telephone Encounter (Signed)
Okay to refill, would like her to schedule a follow up appointment with Dr. Charlann Boxer, sports medicine, for evaluation.

## 2014-03-17 ENCOUNTER — Ambulatory Visit: Payer: Medicare Other | Admitting: Family Medicine

## 2014-03-17 DIAGNOSIS — Z0289 Encounter for other administrative examinations: Secondary | ICD-10-CM

## 2014-03-24 DIAGNOSIS — F3189 Other bipolar disorder: Secondary | ICD-10-CM | POA: Diagnosis not present

## 2014-04-01 ENCOUNTER — Ambulatory Visit (INDEPENDENT_AMBULATORY_CARE_PROVIDER_SITE_OTHER): Payer: Medicare Other | Admitting: Internal Medicine

## 2014-04-01 ENCOUNTER — Encounter: Payer: Self-pay | Admitting: Internal Medicine

## 2014-04-01 ENCOUNTER — Other Ambulatory Visit (INDEPENDENT_AMBULATORY_CARE_PROVIDER_SITE_OTHER): Payer: Medicare Other

## 2014-04-01 VITALS — BP 124/80 | HR 83 | Temp 98.1°F | Wt 195.0 lb

## 2014-04-01 DIAGNOSIS — M25579 Pain in unspecified ankle and joints of unspecified foot: Secondary | ICD-10-CM

## 2014-04-01 DIAGNOSIS — Z8639 Personal history of other endocrine, nutritional and metabolic disease: Secondary | ICD-10-CM | POA: Diagnosis not present

## 2014-04-01 DIAGNOSIS — Z8739 Personal history of other diseases of the musculoskeletal system and connective tissue: Secondary | ICD-10-CM

## 2014-04-01 DIAGNOSIS — Z862 Personal history of diseases of the blood and blood-forming organs and certain disorders involving the immune mechanism: Secondary | ICD-10-CM

## 2014-04-01 LAB — URIC ACID: Uric Acid, Serum: 3.6 mg/dL (ref 2.4–7.0)

## 2014-04-01 MED ORDER — COLCHICINE 0.6 MG PO TABS: 0.6000 mg | ORAL_TABLET | Freq: Two times a day (BID) | ORAL | Status: DC

## 2014-04-01 NOTE — Progress Notes (Signed)
Pre visit review using our clinic review tool, if applicable. No additional management support is needed unless otherwise documented below in the visit note. 

## 2014-04-01 NOTE — Progress Notes (Signed)
   Subjective:    Patient ID: Jasmine Jackson, female    DOB: 07-Jun-1971, 43 y.o.   MRN: [id number]  HPI  Symptoms began 03/29/14 as intense pain above the right medial malleolus associated with swelling and redness. The pain moved anteriorly. The pain was described as dull and cramping. He was constant in nature. There is no significant response to ice, heat, and tramadol.  This profoundly limiting her ambulation for 7-49.  There was some associated numbness and tingling between  toes R foot . She was diagnosed with gout approximately 15 years ago. She's had episodes since mainly in the great toe On no HCTZ  Review of Systems  She denies fever, chills, sweats, weight change.  The been no associated neurologic symptoms except tingling.   Thigh muscle cramps are unrelated.     Objective:   Physical Exam   She appears healthy and well-nourished and in no acute distress.  She is very gregarious in nature  Deep tendon reflexes, strength, and tone are normal the lower extremities  There is a scar over the right medial ankle area anterior to the malleolus. The malleolar area is swollen and tender to touch.  Range of motion is normal in the foot.  Pedal pulses are intact.  Nail health is good.        Assessment & Plan:  See orders   #1 ankle pain; clinically gout suggested ee plan

## 2014-04-01 NOTE — Patient Instructions (Signed)
If gout is suspected; take Aleve one to 2 every 8-12 hours with food as needed. Do not take aspirin or ibuprofen as these do not generally help gout attacks. To prevent gout the minimal uric acid goal is < 7; preferred is < 6, ideal or protective value is < 5 .  The most common cause of elevated uric acid is the ingestion of sugar from high fructose corn syrup sources in processed foods & drinks. You should consume less than 30 grams (ideally ZERO) of sugar per day from foods and drinks with high fructose corn syrup as number 1,2, 3, or #4 on the label. Chronic elevation of uric acid is not an isolated arthritic issue;it also increases cardiac & kidney disease risk.

## 2014-04-04 ENCOUNTER — Encounter: Payer: Self-pay | Admitting: *Deleted

## 2014-04-05 ENCOUNTER — Other Ambulatory Visit: Payer: Self-pay | Admitting: *Deleted

## 2014-04-05 DIAGNOSIS — M25579 Pain in unspecified ankle and joints of unspecified foot: Secondary | ICD-10-CM

## 2014-04-05 DIAGNOSIS — Z8739 Personal history of other diseases of the musculoskeletal system and connective tissue: Secondary | ICD-10-CM

## 2014-04-05 MED ORDER — COLCHICINE 0.6 MG PO TABS: 0.6000 mg | ORAL_TABLET | Freq: Two times a day (BID) | ORAL | Status: DC

## 2014-04-14 ENCOUNTER — Encounter: Payer: Medicare Other | Admitting: Obstetrics & Gynecology

## 2014-04-15 ENCOUNTER — Telehealth: Payer: Self-pay | Admitting: *Deleted

## 2014-04-15 NOTE — Telephone Encounter (Signed)
Pt called states she is having severe Muscle Spasms she is requesting a Cyclobenzaprine refill.  Former pt of Stacy Gardner, [place]  Please advise

## 2014-04-16 ENCOUNTER — Ambulatory Visit (INDEPENDENT_AMBULATORY_CARE_PROVIDER_SITE_OTHER): Payer: Medicare Other | Admitting: Family Medicine

## 2014-04-16 ENCOUNTER — Encounter: Payer: Self-pay | Admitting: Family Medicine

## 2014-04-16 VITALS — BP 120/80 | HR 82 | Temp 98.3°F | Wt 192.0 lb

## 2014-04-16 DIAGNOSIS — F319 Bipolar disorder, unspecified: Secondary | ICD-10-CM | POA: Insufficient documentation

## 2014-04-16 DIAGNOSIS — R252 Cramp and spasm: Secondary | ICD-10-CM

## 2014-04-16 DIAGNOSIS — F39 Unspecified mood [affective] disorder: Secondary | ICD-10-CM | POA: Insufficient documentation

## 2014-04-16 NOTE — Progress Notes (Signed)
   Subjective:    Patient ID: Jasmine Jackson, female    DOB: Dec 04, 1971, 43 y.o.   MRN: [id number]  HPI Here for help with leg cramps. These have been going on for the past year but they are getting more intense and more frequent lately. She describes feeling a burning pain in both anterior thighs which radiates up to the groins, and her legs get tight and hard to move. Last night she had a bad spell and tried to walk it off but it took until this morning before it stopped. No back pain. No leg numbness. She had some physical labs done here recently including a CBC and BMET that were normal.    Review of Systems  Constitutional: Negative.   Respiratory: Negative.   Cardiovascular: Negative.   Musculoskeletal: Positive for myalgias. Negative for back pain.       Objective:   Physical Exam  Constitutional: She appears well-developed and well-nourished. No distress.  Cardiovascular: Normal rate, regular rhythm, normal heart sounds and intact distal pulses.   Pulmonary/Chest: Effort normal and breath sounds normal.  Musculoskeletal: Normal range of motion. She exhibits no edema and no tenderness.          Assessment & Plan:  These are classic leg cramps and we discussed how these can be difficult to treat. I suggested the next time she is in the clinic during the week to ask for labs to check her thyroid and magnesium levels. Suggested she drink plenty of fluids daily. When the cramps start she can try drinking 8 oz of tonic water (for the quinine content) or taking OTC magnesium pills or taking 2-3 tbsp of yellow mustard.

## 2014-04-18 NOTE — Telephone Encounter (Signed)
Pt was seen day after this message in Sat clinic (4/25) - no need for refill noted thanks

## 2014-04-21 DIAGNOSIS — H1045 Other chronic allergic conjunctivitis: Secondary | ICD-10-CM | POA: Diagnosis not present

## 2014-05-03 ENCOUNTER — Ambulatory Visit: Payer: Medicare Other

## 2014-05-04 ENCOUNTER — Ambulatory Visit
Admission: RE | Admit: 2014-05-04 | Discharge: 2014-05-04 | Disposition: A | Discharge status: Home / Self Care | Payer: Medicare Other | Source: Ambulatory Visit

## 2014-05-04 ENCOUNTER — Ambulatory Visit: Payer: Medicare Other

## 2014-05-04 DIAGNOSIS — Z1231 Encounter for screening mammogram for malignant neoplasm of breast: Secondary | ICD-10-CM

## 2014-05-06 ENCOUNTER — Other Ambulatory Visit: Payer: Self-pay | Admitting: Internal Medicine

## 2014-05-06 DIAGNOSIS — R928 Other abnormal and inconclusive findings on diagnostic imaging of breast: Secondary | ICD-10-CM

## 2014-05-19 ENCOUNTER — Ambulatory Visit
Admission: RE | Admit: 2014-05-19 | Discharge: 2014-05-19 | Disposition: A | Discharge status: Home / Self Care | Payer: Medicare Other | Source: Ambulatory Visit | Attending: Internal Medicine | Admitting: Internal Medicine

## 2014-05-19 DIAGNOSIS — R922 Inconclusive mammogram: Secondary | ICD-10-CM | POA: Diagnosis not present

## 2014-05-19 DIAGNOSIS — R928 Other abnormal and inconclusive findings on diagnostic imaging of breast: Secondary | ICD-10-CM

## 2014-05-19 DIAGNOSIS — N6459 Other signs and symptoms in breast: Secondary | ICD-10-CM | POA: Diagnosis not present

## 2014-05-30 DIAGNOSIS — S30860A Insect bite (nonvenomous) of lower back and pelvis, initial encounter: Secondary | ICD-10-CM | POA: Diagnosis not present

## 2014-05-30 DIAGNOSIS — W57XXXA Bitten or stung by nonvenomous insect and other nonvenomous arthropods, initial encounter: Secondary | ICD-10-CM | POA: Diagnosis not present

## 2014-06-02 ENCOUNTER — Encounter: Payer: Self-pay | Admitting: Internal Medicine

## 2014-06-02 ENCOUNTER — Ambulatory Visit (INDEPENDENT_AMBULATORY_CARE_PROVIDER_SITE_OTHER): Payer: Medicare Other | Admitting: Internal Medicine

## 2014-06-02 ENCOUNTER — Other Ambulatory Visit: Payer: Medicare Other

## 2014-06-02 VITALS — BP 128/84 | HR 84 | Temp 98.5°F | Wt 188.8 lb

## 2014-06-02 DIAGNOSIS — L03319 Cellulitis of trunk, unspecified: Secondary | ICD-10-CM | POA: Diagnosis not present

## 2014-06-02 DIAGNOSIS — L02219 Cutaneous abscess of trunk, unspecified: Secondary | ICD-10-CM

## 2014-06-02 DIAGNOSIS — S30860A Insect bite (nonvenomous) of lower back and pelvis, initial encounter: Secondary | ICD-10-CM

## 2014-06-02 DIAGNOSIS — W57XXXA Bitten or stung by nonvenomous insect and other nonvenomous arthropods, initial encounter: Principal | ICD-10-CM

## 2014-06-02 MED ORDER — MUPIROCIN 2 % EX OINT: TOPICAL_OINTMENT | CUTANEOUS | Status: DC

## 2014-06-02 MED ORDER — DOXYCYCLINE HYCLATE 100 MG PO TABS: 100.0000 mg | ORAL_TABLET | Freq: Two times a day (BID) | ORAL | Status: DC

## 2014-06-02 NOTE — Progress Notes (Signed)
Pre visit review using our clinic review tool, if applicable. No additional management support is needed unless otherwise documented below in the visit note. 

## 2014-06-02 NOTE — Patient Instructions (Signed)
Your next office appointment will be determined based upon review of your pending labs . Those instructions will be transmitted to you  by mail. 

## 2014-06-02 NOTE — Progress Notes (Signed)
   Subjective:    Patient ID: Jasmine Jackson, female    DOB: 04-Mar-1971, 43 y.o.   MRN: [id number]  HPI Approximately 3 weeks ago, 05/16/14 she was bitten by a tick. Over the next 2 weeks there was a papular on her back which appeared to be enlarging. She also began experiencing fever, dizziness, night sweats, though headaches, and hot flashes. She was seen at an urgent care 05/30/14 and prescribed doxycycline. The tick was removed with some difficulty. Apparently no blood studies were performed at that time. Since the tick was removed the lesions have  become more inflamed and she's concerned that there is infected. She continues to have dull headaches and night sweats.   Review of Systems  She has had no definite fever. She denies shortness of breath, chest pain, palpitations.      Objective:   Physical Exam General appearance:good health ;well nourished; no acute distress or increased work of breathing is present.  No  lymphadenopathy about the head, neck, or axilla noted.   Eyes: No conjunctival inflammation or lid edema is present. There is no scleral icterus.  Ears:  External ear exam shows no significant lesions or deformities.    Nose:  External nasal examination shows no deformity or inflammation. Nasal mucosa are pink and moist without lesions or exudates. No septal dislocation or deviation.No obstruction to airflow.   Oral exam: Dental hygiene is good; lips and gums are healthy appearing.There is no oropharyngeal erythema or exudate noted.   Neck:  No deformities, thyromegaly, masses, or tenderness noted.   Supple with full range of motion without pain.   Heart:  Normal rate and regular rhythm. S1 and S2 normal without gallop, murmur, click, rub or other extra sounds.   Lungs:Chest clear to auscultation; no wheezes, rhonchi,rales ,or rubs present.No increased work of breathing.    Extremities:  No cyanosis, edema, or clubbing  noted    Skin: Warm & dry . Two cellulitic  lesions L back: 2X2 cm & 11X8 mm w/o abscess.Keloid RUE.         Assessment & Plan:  #1 infected tick bites See orders

## 2014-06-02 NOTE — Progress Notes (Signed)
   Subjective:    Patient ID: Jasmine Jackson, female    DOB: October 09, 1971, 43 y.o.   MRN: [id number]  HPI Pt lives in a [place] area and was bitten by a tick approximately 3 weeks ago (05/16/14). The pt did not feel a bite but rather noticed a bump on her back that seemed to be growing for 2 weeks after the exposure. Over those two weeks the pt also began experiencing fevers, dizziness, night sweats, dull headaches and hot flashes. The pt's sister told her it was a tick that was on her back and the pt presented [facility] on [facility]/30/14. At the clinic the pt was treated with doxycycline. She was given 6 days worth of doxy. The tick body was also removed. The pt did not have blood drawn.   Since Monday the pt states the area of tick removal has become more inflamed and the site is itching. She is afraid it may be infected. She is also still experiencing dull headaches and night sweats.   Review of Systems  Constitutional: Negative for fever.  Respiratory: Negative for shortness of breath.   Cardiovascular: Negative for chest pain.       Objective:   Physical Exam Gen.: Healthy and well-nourished in appearance. Alert, appropriate and cooperative throughout exam. Appears younger than stated age.  Lungs: Normal respiratory effort; chest expands symmetrically. Lungs are clear to auscultation without rales, wheezes, or increased work of breathing.  Heart: Normal rate and rhythm. Normal S1 and S2. No gallop, click, or rub. No murmur.      Skin: Intact without suspicious lesions or rashes.  Lymph: No cervical, axillary lymphadenopathy present.                                                                                  Assessment & Plan:  #1 tick bite; Continue doxy for at least 4 more days for a total of 10 days of treatment. Check RMSF and lyme titers. Mupirocin ointment to sites.  # ti

## 2014-06-03 ENCOUNTER — Telehealth: Payer: Self-pay | Admitting: Internal Medicine

## 2014-06-03 LAB — ROCKY MTN SPOTTED FVR AB, IGM-BLOOD: ROCKY MTN SPOTTED FEVER, IGM: 0.51 IV

## 2014-06-03 LAB — B. BURGDORFI ANTIBODIES: B BURGDORFERI AB IGG+ IGM: 0.7 {ISR}

## 2014-06-03 NOTE — Telephone Encounter (Signed)
Relevant patient education mailed to patient.  

## 2014-06-06 ENCOUNTER — Telehealth: Payer: Self-pay | Admitting: *Deleted

## 2014-06-06 NOTE — Telephone Encounter (Signed)
[facility] Triage Call Report Triage Record Num: [id number] Operator: Feliberto Harts Patient Name: Jasmine Jackson Call Date & Time: 06/04/2014 3:39:12PM Patient Phone: [phone] PCP: Patient Gender: Female PCP Fax : Patient DOB: August 26, 1971 Practice Name: [facility] Reason for Call: Caller: [name]/Patient; PCP: Unice Cobble; CB#: [phone]; Call regarding Lab results; Waiting on lab results from RMSF and Lyme Disease. Drawn 6/11. Per EMR RMSF negative and Burgdorfi neg. Results given to pt. Note to office. Protocol(s) Used: Office Note Recommended Outcome per Protocol: Information Noted and Sent to Office Reason for Outcome: Caller information to office Care Advice: ~ 06/

## 2014-06-15 ENCOUNTER — Encounter: Payer: Medicare Other | Admitting: Obstetrics & Gynecology

## 2014-07-08 ENCOUNTER — Ambulatory Visit: Payer: Medicare Other | Admitting: Internal Medicine

## 2014-07-14 DIAGNOSIS — F3189 Other bipolar disorder: Secondary | ICD-10-CM | POA: Diagnosis not present

## 2014-09-05 ENCOUNTER — Telehealth: Payer: Self-pay

## 2014-09-05 NOTE — Telephone Encounter (Signed)
I contacted patient to let her know she will need an ov. I transferred her to scheduling to do so.

## 2014-09-05 NOTE — Telephone Encounter (Signed)
These symptoms are worrisome & warrant OV

## 2014-09-05 NOTE — Telephone Encounter (Signed)
Phone call from patient. She states over the weekend she was having severe stomach pain and stomach swelling. She took her last two Tramadol and this medication was originally prescribed by Stacy Gardner. She does not want a narcotic drug. Please advise.

## 2014-09-06 ENCOUNTER — Telehealth: Payer: Self-pay

## 2014-09-06 MED ORDER — CYCLOBENZAPRINE HCL 10 MG PO TABS: ORAL_TABLET | ORAL | Status: DC

## 2014-09-06 MED ORDER — CYCLOBENZAPRINE HCL 10 MG PO TABS: 10.0000 mg | ORAL_TABLET | Freq: Every evening | ORAL | Status: DC | PRN

## 2014-09-06 NOTE — Telephone Encounter (Signed)
Corrected from previous script electronically sent

## 2014-09-06 NOTE — Addendum Note (Signed)
Addended by: Roma Schanz R on: 09/06/2014 02:47 PM   Modules accepted: Orders

## 2014-09-06 NOTE — Telephone Encounter (Signed)
1/2-1 qhs prn only #10 OVINB

## 2014-09-08 ENCOUNTER — Ambulatory Visit: Payer: Medicare Other | Admitting: Internal Medicine

## 2014-09-08 ENCOUNTER — Encounter: Payer: Self-pay | Admitting: Internal Medicine

## 2014-09-08 ENCOUNTER — Ambulatory Visit (INDEPENDENT_AMBULATORY_CARE_PROVIDER_SITE_OTHER): Payer: Medicare Other | Admitting: Internal Medicine

## 2014-09-08 ENCOUNTER — Other Ambulatory Visit (INDEPENDENT_AMBULATORY_CARE_PROVIDER_SITE_OTHER): Payer: Medicare Other

## 2014-09-08 VITALS — BP 148/90 | HR 68 | Temp 99.1°F | Wt 186.8 lb

## 2014-09-08 DIAGNOSIS — N760 Acute vaginitis: Secondary | ICD-10-CM

## 2014-09-08 DIAGNOSIS — Z23 Encounter for immunization: Secondary | ICD-10-CM | POA: Diagnosis not present

## 2014-09-08 DIAGNOSIS — R1013 Epigastric pain: Secondary | ICD-10-CM

## 2014-09-08 LAB — CBC WITH DIFFERENTIAL/PLATELET
BASOS ABS: 0 10*3/uL (ref 0.0–0.1)
Basophils Relative: 0.6 % (ref 0.0–3.0)
Eosinophils Absolute: 0.1 10*3/uL (ref 0.0–0.7)
Eosinophils Relative: 1.1 % (ref 0.0–5.0)
HCT: 37.8 % (ref 36.0–46.0)
Hemoglobin: 12.6 g/dL (ref 12.0–15.0)
LYMPHS ABS: 2.2 10*3/uL (ref 0.7–4.0)
LYMPHS PCT: 37.2 % (ref 12.0–46.0)
MCHC: 33.3 g/dL (ref 30.0–36.0)
MCV: 90.4 fl (ref 78.0–100.0)
Monocytes Absolute: 0.3 10*3/uL (ref 0.1–1.0)
Monocytes Relative: 4.6 % (ref 3.0–12.0)
NEUTROS PCT: 56.5 % (ref 43.0–77.0)
Neutro Abs: 3.4 10*3/uL (ref 1.4–7.7)
Platelets: 330 10*3/uL (ref 150.0–400.0)
RBC: 4.18 Mil/uL (ref 3.87–5.11)
RDW: 14.1 % (ref 11.5–15.5)
WBC: 6 10*3/uL (ref 4.0–10.5)

## 2014-09-08 MED ORDER — TRAMADOL HCL 50 MG PO TABS: 50.0000 mg | ORAL_TABLET | Freq: Three times a day (TID) | ORAL | Status: DC | PRN

## 2014-09-08 MED ORDER — HYOSCYAMINE SULFATE 0.125 MG SL SUBL: 0.1250 mg | SUBLINGUAL_TABLET | SUBLINGUAL | Status: DC | PRN

## 2014-09-08 MED ORDER — FLUCONAZOLE 150 MG PO TABS: 150.0000 mg | ORAL_TABLET | Freq: Once | ORAL | Status: DC

## 2014-09-08 NOTE — Progress Notes (Signed)
   Subjective:    Patient ID: Jasmine Jackson, female    DOB: [birth year], 43 y.o.   MRN: [id number]  HPI   Her symptoms began 09/03/14 as dull pain with dramatic bloating of the abdomen mainly in the epigastric area. This persisted 9/12-9/14. Initially it was up to a level X but did respond to tramadol dropping to a level IV. Both pain and swelling responded & resolved by 9/14.Marland Kitchen  She had associated nausea  She has constipation and had dark stool 09/04/14. On 9/15 she had bright blood in the movement. She must strain to have a bowel movement.  Her colonoscopy was performed 2014 by Dr. Fuller Plan. This revealed lymphoid aggregates on biopsy but no polyps.PMH of IBS  Past history includes total abdominal hysterectomy in 2002 for fibroids. She's never had an abnormal Pap smear. She is unsure whether she's had a salpingo-oophorectomy. The surgery was done after delivery so it is doubtful.   Review of Systems  She's had some vaginal itching after intercourse.  She has had some cloudy urine with a without other urinary symptoms.  She specifically denies frequency, urgency, hematuria, dysuria, or pyuria.  She does not have dysphagia, severe dyspepsia, unexplained weight loss, or frank melena.     Objective:   Physical Exam  Positive or pertinent physical findings include: She has minimal tenderness in the left lower quadrant.  Very animated & interactive. She has a "million dollar smile".  General appearance :adequately nourished; in no distress. Eyes: No conjunctival inflammation or scleral icterus is present. Oral exam: Dental hygiene is good. Lips and gums are healthy appearing.There is no oropharyngeal erythema or exudate noted.  Heart:  Normal rate and regular rhythm. S1 and S2 normal without gallop, murmur, click, rub or other extra sounds   Lungs:Chest clear to auscultation; no wheezes, rhonchi,rales ,or rubs present.No increased work of breathing.  Abdomen: bowel sounds normal, soft   without masses, organomegaly or hernias noted.  No guarding or rebound. No flank tenderness to percussion. Skin:Warm & dry.  Intact without suspicious lesions or rashes ; no jaundice or tenting Lymphatic: No lymphadenopathy is noted about the head, neck, axilla           Assessment & Plan:  #1 abdominal pain and bloating; history of localization of the pain suggests that this is not ovarian in nature.  #2 history of GERD  #3 history of lymphoid infiltrates on biopsy  #4 vaginitis; Diflucan will be prescribed.  Plan: Tramadol will be renewed as she uses it sparingly. Gynecologic F/U recommended.

## 2014-09-08 NOTE — Patient Instructions (Addendum)
Your next office appointment will be determined based upon review of your pending labs . Those instructions will be transmitted to you  by mail.Followup as needed for your acute issue. Please report any significant change in your symptoms. Please follow up with your Gynecologist.  Reflux of gastric acid may be asymptomatic as this may occur mainly during sleep.The triggers for reflux  include stress; the "aspirin family" ; alcohol; peppermint; and caffeine (coffee, tea, cola, and chocolate). The aspirin family would include aspirin and the nonsteroidal agents such as ibuprofen &  Naproxen. Tylenol would not cause reflux. If having symptoms ; food & drink should be avoided for @ least 2 hours before going to bed.

## 2014-09-08 NOTE — Progress Notes (Signed)
Pre visit review using our clinic review tool, if applicable. No additional management support is needed unless otherwise documented below in the visit note. 

## 2014-10-12 DIAGNOSIS — F3181 Bipolar II disorder: Secondary | ICD-10-CM | POA: Diagnosis not present

## 2014-10-19 ENCOUNTER — Other Ambulatory Visit: Payer: Self-pay | Admitting: Internal Medicine

## 2014-10-19 DIAGNOSIS — N760 Acute vaginitis: Secondary | ICD-10-CM

## 2014-10-27 DIAGNOSIS — N760 Acute vaginitis: Secondary | ICD-10-CM | POA: Diagnosis not present

## 2014-10-27 DIAGNOSIS — R1032 Left lower quadrant pain: Secondary | ICD-10-CM | POA: Diagnosis not present

## 2014-11-03 DIAGNOSIS — N8320 Unspecified ovarian cysts: Secondary | ICD-10-CM | POA: Diagnosis not present

## 2014-11-03 DIAGNOSIS — Z113 Encounter for screening for infections with a predominantly sexual mode of transmission: Secondary | ICD-10-CM | POA: Diagnosis not present

## 2014-11-03 DIAGNOSIS — R1032 Left lower quadrant pain: Secondary | ICD-10-CM | POA: Diagnosis not present

## 2014-11-07 ENCOUNTER — Other Ambulatory Visit: Payer: Self-pay

## 2014-11-07 MED ORDER — TIZANIDINE HCL 2 MG PO TABS: 2.0000 mg | ORAL_TABLET | Freq: Three times a day (TID) | ORAL | Status: DC | PRN

## 2014-11-07 NOTE — Telephone Encounter (Signed)
Received a fax from patient's insurance plan stating Cyclobenzaprine 10 mg will no longer be covered. Requesting an alternative. Per Dr Linna Darner change to Tizanidine 2 mg 1 every 8 hours prn.

## 2014-12-08 ENCOUNTER — Other Ambulatory Visit: Payer: Self-pay | Admitting: Internal Medicine

## 2014-12-08 ENCOUNTER — Other Ambulatory Visit: Payer: Self-pay | Admitting: Gastroenterology

## 2014-12-08 NOTE — Telephone Encounter (Signed)
Phone call to patient. She states the zofran was prescribed when she was being seen at [facility]. Patient was upset and confused that you are not her primary physician.

## 2014-12-08 NOTE — Telephone Encounter (Signed)
Please verify prescribing MD for Zofran She needs to establish with new primary care ; she was seeing Stacy Gardner, NP. Please remove my name;I saw her for acute visits but did not establish as her PCP.Marland KitchenThanks.

## 2014-12-08 NOTE — Telephone Encounter (Signed)
This prescription will be refilled one time by  me; it will be necessary for her to establish with a primary care physician for additional refills. I am not taking new patients at this time as I am transitioning to retiring

## 2015-01-07 ENCOUNTER — Emergency Department (INDEPENDENT_AMBULATORY_CARE_PROVIDER_SITE_OTHER)
Admission: EM | Admit: 2015-01-07 | Discharge: 2015-01-07 | Disposition: A | Discharge status: Home / Self Care | Payer: Medicare Other | Source: Home / Self Care | Attending: Emergency Medicine | Admitting: Emergency Medicine

## 2015-01-07 ENCOUNTER — Encounter (HOSPITAL_COMMUNITY): Payer: Self-pay | Admitting: Emergency Medicine

## 2015-01-07 DIAGNOSIS — K040 Pulpitis: Secondary | ICD-10-CM | POA: Diagnosis not present

## 2015-01-07 DIAGNOSIS — K0401 Reversible pulpitis: Secondary | ICD-10-CM

## 2015-01-07 MED ORDER — KETOROLAC TROMETHAMINE 60 MG/2ML IM SOLN
60.0000 mg | Freq: Once | INTRAMUSCULAR | Status: AC
  Administered 2015-01-07: 60 mg via INTRAMUSCULAR

## 2015-01-07 MED ORDER — KETOROLAC TROMETHAMINE 60 MG/2ML IM SOLN
INTRAMUSCULAR | Status: AC
  Filled 2015-01-07: qty 2

## 2015-01-07 MED ORDER — DICLOFENAC SODIUM 75 MG PO TBEC: 75.0000 mg | DELAYED_RELEASE_TABLET | Freq: Two times a day (BID) | ORAL | Status: DC

## 2015-01-07 MED ORDER — CLINDAMYCIN HCL 300 MG PO CAPS: 300.0000 mg | ORAL_CAPSULE | Freq: Four times a day (QID) | ORAL | Status: DC

## 2015-01-07 NOTE — Discharge Instructions (Signed)
Look up the [place] [facility] for free dental clinics. undoomedical.com.asp  Get there early and be prepared to wait. [facility] and [employer] have [employer]writer, advertising schools that provide low cost routine dental care.   Other resources: [facility] [place], [place] [phone]  Patients with Medicaid: [address] [employer] Phone:  [phone]                                                  Phone:  [phone]  Dr. Ardyth Harps [address] [phone]  If unable to pay or uninsured, contact:  [place] or [facility]. to become qualified for the adult dental clinic.  No matter what dental problem you have, it will not get better unless you get good dental care.  If the tooth is not taken care of, your symptoms will come back in time and you will be visiting [place] again in the [facility] [place] with a bad toothache.  So, see your dentist as soon as possible.  If you don't have a dentist, we can give you a list of dentists.  Sometimes the most cost effective treatment is removal of the tooth.  This can be done very inexpensively through one of the low cost Dance movement psychotherapist such as the facility on [facility] in [place] [phone]).  The downside to this is that you will have one less tooth and this can effect your ability to chew.  Some other things that can be done for a dental infection include the following:   Rinse your mouth out with hot salt water (1/2 tsp of table salt and a pinch of baking soda in 8 oz of hot water).  You can do this every 2 or 3 hours.  Avoid cold foods, beverages, and cold air.  This will make your symptoms worse.  Sleep with your head elevated.  Sleeping flat will cause your gums and oral tissues to swell and make them hurt  more.  You can sleep on several pillows.  Even better is to sleep in a recliner with your head higher than your heart.  For mild to moderate pain, you can take Tylenol, ibuprofen, or Aleve.  External application of heat by a heating pad, hot water bottle, or hot wet towel can help with pain and speed healing.  You can do this every 2 to 3 hours. Do not fall asleep on a heating pad since this can cause a burn.

## 2015-01-07 NOTE — ED Provider Notes (Signed)
   Chief Complaint   Dental Pain   History of Present Illness   Jasmine Jackson is a 44 year old female who's had a three-day history of pain in her left, upper, first premolar. This has been broken for years. The pain radiates towards her left ear. There is swelling of the gingiva and the cheek. She's felt chilled and had a headache. No fever. No difficulty swallowing or breathing. Able to open her mouth fully. It hurts to chew on that side. No neck pain or swelling. No chest pain or shortness of breath.  Review of Systems   Other than as noted above, the patient denies any of the following symptoms: Systemic:  No fever or chills. ENT:  No headache, ear ache, sore throat, nasal congestion, facial pain, or swelling. Neck:  No adenopathy or neck swelling. Lungs:  No coughing or shortness of breath.  [place]   Past medical history, family history, social history, meds, and allergies were reviewed. She is allergic to morphine. She takes clonazepam, cyclobenzaprine, Lamictal, BuSpar, Prozac, and dicyclomine.  Physical Examination     Vital signs:  BP 160/88 mmHg  Pulse 99  Temp(Src) 98.3 F (36.8 C) (Oral)  Resp 20  SpO2 100% General:  Alert, oriented, in no distress. ENT:  TMs and canals normal.  Nasal mucosa normal. Mouth exam:  The posterior portion of the left, first premolar was broken off. There is no visible swelling of the gingiva or collection of pus. No swelling of the time of the floor the mouth, otherwise teeth are in good repair. Neck:  No swelling or adenopathy. Lungs:  Breath sounds clear and equal bilaterally.  No wheezes, rales or rhonchi. Heart:  Regular rhythm.  No gallops or murmers. Skin:  Clear, warm and dry.    Course in [facility] [place]   The following medications were given:  Medications  ketorolac (TORADOL) injection 60 mg (60 mg Intramuscular Given 01/07/15 1641)   Assessment   The encounter diagnosis was Pulpitis.  No evidence of Ludwig's  angina.    Plan   1.  Meds:  The following meds were prescribed:   Discharge Medication List as of 01/07/2015  4:28 PM    START taking these medications   Details  clindamycin (CLEOCIN) 300 MG capsule Take 1 capsule (300 mg total) by mouth 4 (four) times daily., Starting 01/07/2015, Until Discontinued, Normal    diclofenac (VOLTAREN) 75 MG EC tablet Take 1 tablet (75 mg total) by mouth 2 (two) times daily., Starting 01/07/2015, Until Discontinued, Normal        2.  Patient Education/Counseling:  The patient was given appropriate handouts, self care instructions, and instructed in pain control. Suggested sleeping with head of bed elevated and hot salt water mouthwash.   3.  Follow up:  The patient was told to follow up if no better in 3 to 4 days, if becoming worse in any way, and given some red flag symptoms such as difficulty swallowing or breathing which would prompt immediate return.  Follow up with a dentist as soon as posssible.     Harden Mo, MD 01/07/15 [phone]

## 2015-01-12 DIAGNOSIS — F3181 Bipolar II disorder: Secondary | ICD-10-CM | POA: Diagnosis not present

## 2015-03-07 ENCOUNTER — Other Ambulatory Visit: Payer: Self-pay | Admitting: Gastroenterology

## 2015-03-07 ENCOUNTER — Other Ambulatory Visit: Payer: Self-pay | Admitting: Internal Medicine

## 2015-03-07 NOTE — Telephone Encounter (Signed)
She needs to establish with PCP; she had seen Stacy Gardner, NP

## 2015-03-14 DIAGNOSIS — F3181 Bipolar II disorder: Secondary | ICD-10-CM | POA: Diagnosis not present

## 2015-03-15 ENCOUNTER — Ambulatory Visit: Payer: Medicare Other | Admitting: Internal Medicine

## 2015-03-26 ENCOUNTER — Other Ambulatory Visit: Payer: Self-pay | Admitting: Gastroenterology

## 2015-03-27 NOTE — Telephone Encounter (Signed)
NEEDS OFFICE VISIT FOR ANY FURTHER REFILLS! 

## 2015-04-13 ENCOUNTER — Ambulatory Visit: Payer: Medicare Other | Admitting: Internal Medicine

## 2015-04-21 ENCOUNTER — Other Ambulatory Visit: Payer: Self-pay

## 2015-04-21 NOTE — Telephone Encounter (Signed)
Not on current medication list.   

## 2015-04-21 NOTE — Telephone Encounter (Signed)
Based on her list of medications this is not medically advisable w/o exam Lucy, I'm OOT ;please pass messages to other care providers. Thanks, Hopp  Also please take my name off as PCP ; she was [employer] patient

## 2015-04-24 ENCOUNTER — Ambulatory Visit (INDEPENDENT_AMBULATORY_CARE_PROVIDER_SITE_OTHER): Payer: Medicare Other | Admitting: Internal Medicine

## 2015-04-24 ENCOUNTER — Other Ambulatory Visit (INDEPENDENT_AMBULATORY_CARE_PROVIDER_SITE_OTHER): Payer: Medicare Other

## 2015-04-24 ENCOUNTER — Encounter: Payer: Self-pay | Admitting: Internal Medicine

## 2015-04-24 VITALS — BP 142/82 | HR 67 | Temp 98.7°F | Resp 12 | Ht 66.0 in | Wt 190.0 lb

## 2015-04-24 DIAGNOSIS — M62838 Other muscle spasm: Secondary | ICD-10-CM | POA: Insufficient documentation

## 2015-04-24 DIAGNOSIS — I10 Essential (primary) hypertension: Secondary | ICD-10-CM | POA: Insufficient documentation

## 2015-04-24 DIAGNOSIS — Z8639 Personal history of other endocrine, nutritional and metabolic disease: Secondary | ICD-10-CM

## 2015-04-24 DIAGNOSIS — R1013 Epigastric pain: Secondary | ICD-10-CM | POA: Diagnosis not present

## 2015-04-24 DIAGNOSIS — Z8739 Personal history of other diseases of the musculoskeletal system and connective tissue: Secondary | ICD-10-CM

## 2015-04-24 DIAGNOSIS — E789 Disorder of lipoprotein metabolism, unspecified: Secondary | ICD-10-CM | POA: Diagnosis not present

## 2015-04-24 DIAGNOSIS — M25579 Pain in unspecified ankle and joints of unspecified foot: Secondary | ICD-10-CM

## 2015-04-24 DIAGNOSIS — R7301 Impaired fasting glucose: Secondary | ICD-10-CM

## 2015-04-24 DIAGNOSIS — R030 Elevated blood-pressure reading, without diagnosis of hypertension: Secondary | ICD-10-CM

## 2015-04-24 DIAGNOSIS — M62830 Muscle spasm of back: Secondary | ICD-10-CM | POA: Insufficient documentation

## 2015-04-24 LAB — COMPREHENSIVE METABOLIC PANEL
ALK PHOS: 61 U/L (ref 39–117)
ALT: 12 U/L (ref 0–35)
AST: 14 U/L (ref 0–37)
Albumin: 3.9 g/dL (ref 3.5–5.2)
BILIRUBIN TOTAL: 0.8 mg/dL (ref 0.2–1.2)
BUN: 8 mg/dL (ref 6–23)
CO2: 25 mEq/L (ref 19–32)
CREATININE: 0.66 mg/dL (ref 0.40–1.20)
Calcium: 9.3 mg/dL (ref 8.4–10.5)
Chloride: 105 mEq/L (ref 96–112)
GFR: 124.66 mL/min (ref >60.00)
GLUCOSE: 86 mg/dL (ref 70–99)
POTASSIUM: 3.5 meq/L (ref 3.5–5.1)
Sodium: 137 mEq/L (ref 135–145)
TOTAL PROTEIN: 7.1 g/dL (ref 6.0–8.3)

## 2015-04-24 LAB — LIPID PANEL
Cholesterol: 159 mg/dL (ref 0–200)
HDL: 40.3 mg/dL (ref >39.00)
LDL Cholesterol: 89 mg/dL (ref 0–99)
NONHDL: 118.7
TRIGLYCERIDES: 149 mg/dL (ref 0.0–149.0)
Total CHOL/HDL Ratio: 4
VLDL: 29.8 mg/dL (ref 0.0–40.0)

## 2015-04-24 LAB — CK: Total CK: 139 U/L (ref 7–177)

## 2015-04-24 LAB — MAGNESIUM: Magnesium: 1.7 mg/dL (ref 1.5–2.5)

## 2015-04-24 LAB — HEMOGLOBIN A1C: Hgb A1c MFr Bld: 5.5 % (ref 4.6–6.5)

## 2015-04-24 LAB — URIC ACID: URIC ACID, SERUM: 4.5 mg/dL (ref 2.4–7.0)

## 2015-04-24 MED ORDER — TRAMADOL HCL 50 MG PO TABS: 50.0000 mg | ORAL_TABLET | Freq: Three times a day (TID) | ORAL | Status: DC | PRN

## 2015-04-24 MED ORDER — TIZANIDINE HCL 2 MG PO TABS: 2.0000 mg | ORAL_TABLET | Freq: Three times a day (TID) | ORAL | Status: DC | PRN

## 2015-04-24 MED ORDER — COLCHICINE 0.6 MG PO TABS: 0.6000 mg | ORAL_TABLET | Freq: Two times a day (BID) | ORAL | Status: DC

## 2015-04-24 NOTE — Addendum Note (Signed)
Addended by: Vertell Novak A on: 04/24/2015 03:14 PM   Modules accepted: Level of Service

## 2015-04-24 NOTE — Assessment & Plan Note (Signed)
Checking labs today to make sure we are not missing kidney, liver or electrolyte dysfunction. Checking CK level and lipid panel as well as uric acid level (last 1 year ago was controlled). She is currently using tizanidine which is usually helpful. Refill sent of that today. Encouraged to drink more water to see if this helps as well as taking a multivitamin over the counter daily.

## 2015-04-24 NOTE — Patient Instructions (Addendum)
We will check the blood pressure next visit and if it is still a little elevated we can talk about starting a low dose of a blood pressure medicine. We are going to check some blood work on you today to see if there is a cause for the muscle cramps.   We have sent in your refills today. Come back in the fall sometime for your physical.

## 2015-04-24 NOTE — Progress Notes (Signed)
Pre visit review using our clinic review tool, if applicable. No additional management support is needed unless otherwise documented below in the visit note. 

## 2015-04-24 NOTE — Progress Notes (Signed)
   Subjective:    Patient ID: Jasmine Jackson, female    DOB: [birth year], 44 y.o.   MRN: [id number]  HPI The patient is a 44 YO female who is coming in for muscle cramps and blood pressure. She has noticed that at several of her recent visits her blood pressure is mildly elevated. She denies headaches, chest pains, SOB. She denies elevated readings at home. She is having muscle cramps that come several times per day. She is using the tizanidine for it but sometimes the pain is so intense it is not enough. At maximum they hurt 10/10 but usually are about 4-5/10 and relieve to 1/10 with tizanidine. She has not noticed them increasing in frequency over the last 6 months. Has not tried other OTC remedies for them. Does try some heat on the areas which is sometimes helpful and other times does not help.   Review of Systems  Constitutional: Negative.   HENT: Negative.   Respiratory: Negative.   Cardiovascular: Negative.   Musculoskeletal: Positive for myalgias and arthralgias.  Skin: Negative.   Neurological: Negative for weakness, numbness and headaches.       Muscle cramps  Psychiatric/Behavioral: Negative.       Objective:   Physical Exam  Constitutional: She is oriented to person, place, and time. She appears well-developed and well-nourished.  HENT:  Head: Normocephalic and atraumatic.  Eyes: EOM are normal.  Neck: Normal range of motion.  Cardiovascular: Normal rate and regular rhythm.   Pulmonary/Chest: Effort normal. No respiratory distress. She has no wheezes. She has no rales.  Abdominal: Soft. Bowel sounds are normal. She exhibits no distension. There is no tenderness.  Neurological: She is alert and oriented to person, place, and time. Coordination normal.  Skin: Skin is warm and dry.   Filed Vitals:   04/24/15 1343  BP: 142/82  Pulse: 67  Temp: 98.7 F (37.1 C)  TempSrc: Oral  Resp: 12  Height: 5\' 6"  (1.676 m)  Weight: 190 lb (86.183 kg)  SpO2: 97%      Assessment &  Plan:

## 2015-04-24 NOTE — Assessment & Plan Note (Signed)
Checking BP at next visit and if still elevated will start agent for blood pressure. For now advised to work on sodium in her diet and getting more regular exercise and she will work on these things. Checking labs today.

## 2015-05-30 DIAGNOSIS — F3181 Bipolar II disorder: Secondary | ICD-10-CM | POA: Diagnosis not present

## 2015-06-09 ENCOUNTER — Other Ambulatory Visit: Payer: Self-pay | Admitting: Internal Medicine

## 2015-06-28 ENCOUNTER — Other Ambulatory Visit: Payer: Self-pay | Admitting: Internal Medicine

## 2015-06-28 ENCOUNTER — Other Ambulatory Visit: Payer: Self-pay

## 2015-06-28 DIAGNOSIS — Z1231 Encounter for screening mammogram for malignant neoplasm of breast: Secondary | ICD-10-CM

## 2015-06-28 DIAGNOSIS — Z803 Family history of malignant neoplasm of breast: Secondary | ICD-10-CM

## 2015-07-05 ENCOUNTER — Ambulatory Visit
Admission: RE | Admit: 2015-07-05 | Discharge: 2015-07-05 | Disposition: A | Discharge status: Home / Self Care | Payer: Medicare Other | Source: Ambulatory Visit

## 2015-07-05 DIAGNOSIS — Z1231 Encounter for screening mammogram for malignant neoplasm of breast: Secondary | ICD-10-CM | POA: Diagnosis not present

## 2015-07-05 DIAGNOSIS — Z803 Family history of malignant neoplasm of breast: Secondary | ICD-10-CM

## 2015-07-21 DIAGNOSIS — F3181 Bipolar II disorder: Secondary | ICD-10-CM | POA: Diagnosis not present

## 2015-07-24 ENCOUNTER — Telehealth: Payer: Self-pay

## 2015-07-24 NOTE — Telephone Encounter (Signed)
Call to introduce AWV; Educated on Medicare screens etc and the patient stated she would prefer to opt out, as she has a separate doctor that takes care of these issues, so will decline at present.

## 2015-07-31 ENCOUNTER — Ambulatory Visit (INDEPENDENT_AMBULATORY_CARE_PROVIDER_SITE_OTHER): Payer: Medicare Other | Admitting: Adult Health

## 2015-07-31 ENCOUNTER — Encounter: Payer: Self-pay | Admitting: Adult Health

## 2015-07-31 ENCOUNTER — Other Ambulatory Visit (HOSPITAL_COMMUNITY)
Admission: RE | Admit: 2015-07-31 | Discharge: 2015-07-31 | Disposition: A | Discharge status: Home / Self Care | Payer: Medicare Other | Source: Ambulatory Visit | Attending: Adult Health | Admitting: Adult Health

## 2015-07-31 VITALS — BP 110/80 | Temp 99.4°F | Ht 66.0 in | Wt 190.9 lb

## 2015-07-31 DIAGNOSIS — N3000 Acute cystitis without hematuria: Secondary | ICD-10-CM | POA: Diagnosis not present

## 2015-07-31 DIAGNOSIS — R300 Dysuria: Secondary | ICD-10-CM | POA: Diagnosis not present

## 2015-07-31 DIAGNOSIS — Z113 Encounter for screening for infections with a predominantly sexual mode of transmission: Secondary | ICD-10-CM | POA: Insufficient documentation

## 2015-07-31 DIAGNOSIS — N760 Acute vaginitis: Secondary | ICD-10-CM | POA: Insufficient documentation

## 2015-07-31 DIAGNOSIS — Z711 Person with feared health complaint in whom no diagnosis is made: Secondary | ICD-10-CM

## 2015-07-31 LAB — POCT URINALYSIS DIPSTICK
BILIRUBIN UA: NEGATIVE
Glucose, UA: NEGATIVE
Ketones, UA: NEGATIVE
Nitrite, UA: NEGATIVE
PROTEIN UA: NEGATIVE
SPEC GRAV UA: 1.015
Urobilinogen, UA: 0.2
pH, UA: 7

## 2015-07-31 MED ORDER — SULFAMETHOXAZOLE-TRIMETHOPRIM 800-160 MG PO TABS: 1.0000 | ORAL_TABLET | Freq: Two times a day (BID) | ORAL | Status: DC

## 2015-07-31 MED ORDER — PHENAZOPYRIDINE HCL 200 MG PO TABS: 200.0000 mg | ORAL_TABLET | Freq: Three times a day (TID) | ORAL | Status: DC | PRN

## 2015-07-31 NOTE — Progress Notes (Signed)
   Subjective:    Patient ID: Jasmine Jackson, female    DOB: 20-Nov-1971, 44 y.o.   MRN: [id number]  HPI  44 year old female who presents to the office today with two complaints.   1) UTI type symptoms.  - she endorses that since Saturday she has had cloudy urine, odor and pain with urination.   2) On Friday she had a condom break while having sex with her boyfriend. She would like to be tested for STD's.    Review of Systems  Genitourinary: Positive for dysuria. Negative for urgency, frequency, hematuria, flank pain, decreased urine volume, vaginal bleeding, vaginal discharge and vaginal pain.       Cloudy urine with odor  All other systems reviewed and are negative.      Objective:   Physical Exam  Constitutional: She is oriented to person, place, and time. She appears well-developed and well-nourished. No distress.  Abdominal: Soft. Bowel sounds are normal. She exhibits no distension. There is no tenderness. There is no rebound and no guarding.  Musculoskeletal: Normal range of motion. She exhibits tenderness (+ CVA tenderness on right ).  Neurological: She is alert and oriented to person, place, and time.  Skin: Skin is warm and dry. No rash noted. She is not diaphoretic. No erythema. No pallor.  Psychiatric: She has a normal mood and affect. Her behavior is normal. Judgment and thought content normal.  Nursing note and vitals reviewed.      Assessment & Plan:   1. Burning with urination  - POCT urinalysis dipstick - Culture, Urine - Urine cytology ancillary only  2. Acute cystitis without hematuria - phenazopyridine (PYRIDIUM) 200 MG tablet; Take 1 tablet (200 mg total) by mouth 3 (three) times daily as needed for pain.  Dispense: 10 tablet; Refill: 0 - sulfamethoxazole-trimethoprim (BACTRIM DS,SEPTRA DS) 800-160 MG per tablet; Take 1 tablet by mouth 2 (two) times daily.  Dispense: 6 tablet; Refill: 0 - Will call with results of urine culture. Depending on results will  consider changing or discontinuing medications.   3. Concern about STD in female without diagnosis - Patient was informed that I would be happy to test her for STD's but that it has been so soon that if she had encountered any STD's that it may be too early to tell.  - Advised follow up in 2-3 weeks or sooner with any signs or symptoms.

## 2015-07-31 NOTE — Progress Notes (Signed)
Pre visit review using our clinic review tool, if applicable. No additional management support is needed unless otherwise documented below in the visit note. 

## 2015-07-31 NOTE — Patient Instructions (Signed)
It was great meeting you today.   Please take the antibiotics as prescribed. You can take the pyridium as needed.   I will follow up with you regarding labs.

## 2015-08-02 LAB — URINE CYTOLOGY ANCILLARY ONLY
Chlamydia: NEGATIVE
Neisseria Gonorrhea: NEGATIVE
TRICH (WINDOWPATH): NEGATIVE

## 2015-08-03 LAB — URINE CULTURE

## 2015-08-04 LAB — URINE CYTOLOGY ANCILLARY ONLY: Candida vaginitis: NEGATIVE

## 2015-08-07 ENCOUNTER — Other Ambulatory Visit: Payer: Self-pay | Admitting: Geriatric Medicine

## 2015-08-07 ENCOUNTER — Telehealth: Payer: Self-pay | Admitting: Internal Medicine

## 2015-08-07 NOTE — Telephone Encounter (Signed)
Patient called stating she has requested a new rx for diclofenac through her pharmacy, however I do not see that we have received anything. Pt uses [facility] [employer] [address].

## 2015-08-07 NOTE — Telephone Encounter (Signed)
Medication is marked as not taking. I need to call and verify that the patient still takes this medication before I can refill this medication.

## 2015-08-09 ENCOUNTER — Encounter: Payer: Medicare Other | Admitting: Internal Medicine

## 2015-08-31 DIAGNOSIS — F3181 Bipolar II disorder: Secondary | ICD-10-CM | POA: Diagnosis not present

## 2015-09-02 ENCOUNTER — Encounter: Payer: Self-pay | Admitting: Internal Medicine

## 2015-09-02 ENCOUNTER — Ambulatory Visit (INDEPENDENT_AMBULATORY_CARE_PROVIDER_SITE_OTHER): Payer: Medicare Other | Admitting: Internal Medicine

## 2015-09-02 VITALS — BP 104/78 | HR 86 | Temp 98.6°F | Ht 66.0 in | Wt 189.8 lb

## 2015-09-02 DIAGNOSIS — N898 Other specified noninflammatory disorders of vagina: Secondary | ICD-10-CM

## 2015-09-02 DIAGNOSIS — B9689 Other specified bacterial agents as the cause of diseases classified elsewhere: Secondary | ICD-10-CM | POA: Insufficient documentation

## 2015-09-02 DIAGNOSIS — M545 Low back pain, unspecified: Secondary | ICD-10-CM

## 2015-09-02 DIAGNOSIS — N760 Acute vaginitis: Secondary | ICD-10-CM | POA: Insufficient documentation

## 2015-09-02 LAB — POCT URINALYSIS DIPSTICK
Bilirubin, UA: NEGATIVE
GLUCOSE UA: NEGATIVE
KETONES UA: NEGATIVE
Leukocytes, UA: NEGATIVE
Nitrite, UA: NEGATIVE
PH UA: 6
Spec Grav, UA: >=1.03
Urobilinogen, UA: 2

## 2015-09-02 MED ORDER — FLUCONAZOLE 150 MG PO TABS: 150.0000 mg | ORAL_TABLET | Freq: Once | ORAL | Status: DC

## 2015-09-02 NOTE — Assessment & Plan Note (Signed)
No UTI Fairly severe back pain--but no signs PID. Negative GC/chlamydia after condom breakage Dalbert Batman was negative also Will try empiric Rx for candida She should contact Dr Doug Sou if ongoing symptoms

## 2015-09-02 NOTE — Progress Notes (Signed)
Subjective:    Patient ID: Jasmine Jackson, female    DOB: [birth year], 44 y.o.   MRN: [id number]  HPI Has vaginal discharge  Reviewed last note Felt better after last visit  Discharge for 3 days Clear liquid No dysuria--just notes dark urine No urgency or increased frequency  Having back pain No fever No recent intercourse  Current Outpatient Prescriptions on File Prior to Visit  Medication Sig Dispense Refill  . albuterol (PROVENTIL HFA;VENTOLIN HFA) 108 (90 BASE) MCG/ACT inhaler Inhale 2 puffs into the lungs every 6 (six) hours as needed for wheezing or shortness of breath.     . busPIRone (BUSPAR) 15 MG tablet Take 30 mg by mouth 2 (two) times daily.    . colchicine (COLCRYS) 0.6 MG tablet Take 1 tablet (0.6 mg total) by mouth 2 (two) times daily. 30 tablet 0  . diclofenac (VOLTAREN) 75 MG EC tablet Take 1 tablet (75 mg total) by mouth 2 (two) times daily. 20 tablet 0  . dicyclomine (BENTYL) 10 MG capsule Take 1 capsule (10 mg total) by mouth 4 (four) times daily -  before meals and at bedtime. 120 capsule 11  . FLUoxetine (PROZAC) 40 MG capsule 40 mg. Take two capsules every morning    . gabapentin (NEURONTIN) 600 MG tablet Take 600 mg by mouth 3 (three) times daily.    Marland Kitchen omeprazole (PRILOSEC) 40 MG capsule TAKE 1 CAPSULE BY MOUTH TWICE DAILY 180 capsule 0  . ondansetron (ZOFRAN) 4 MG tablet TAKE 1 TABLET BY MOUTH EVERY 6 HOURS AS NEEDED FOR VOMITING 20 tablet 0  . tiZANidine (ZANAFLEX) 2 MG tablet TAKE 1 TABLET(2 MG) BY MOUTH EVERY 8 HOURS AS NEEDED 270 tablet 3  . traMADol (ULTRAM) 50 MG tablet Take 1 tablet (50 mg total) by mouth every 8 (eight) hours as needed. 30 tablet 0   No current facility-administered medications on file prior to visit.    Allergies  Allergen Reactions  . Morphine And Related Itching    Itching w/o rash    Past Medical History  Diagnosis Date  . Heart murmur   . Anemia   . Anxiety and depression   . Hemorrhoids   . Asthma   . Bipolar 1  disorder   . GERD (gastroesophageal reflux disease)   . Internal hemorrhoids   . Anxiety   . Depression   . Hiatal hernia     Past Surgical History  Procedure Laterality Date  . Abdominal hysterectomy      fibroids  . Tonsillectomy    . Inguinal hernia repair    . Hemorrhoid surgery    . Tubal ligation      Family History  Problem Relation Age of Onset  . Breast cancer Maternal Aunt     x4  . Stomach cancer Maternal Aunt   . Prostate cancer Maternal Uncle     x2  . Breast cancer Maternal Grandmother   . Celiac disease Mother   . Colon cancer Neg Hx   . Esophageal cancer Neg Hx   . Rectal cancer Neg Hx     Social History   Social History  . Marital Status: Single    Spouse Name: N/A  . Number of Children: 1  . Years of Education: N/A   Occupational History  . Disability    Social History Main Topics  . Smoking status: Current Every Day Smoker -- 0.50 packs/day    Types: Cigarettes  . Smokeless tobacco: Never Used  Comment: 1 pack every 3 days  . Alcohol Use: Yes     Comment: rare occasions  . Drug Use: No  . Sexual Activity: Not on file   Other Topics Concern  . Not on file   Social History Narrative   Review of Systems No vomiting or diarrhea Some constipation lately--known IBS Appetite is okay--craving sweets though    Objective:   Physical Exam  Abdominal: Soft. She exhibits no distension. There is no tenderness. There is no rebound and no guarding.  Genitourinary:  Normal introitus Cervix absent No vaginal tenderness Thick white discharge-- negative whiff  Musculoskeletal:  Upper back tenderness--but not really lower          Assessment & Plan:

## 2015-09-02 NOTE — Patient Instructions (Signed)
Take the fluconazole once--and repeat in a few days if the discharge persists.

## 2015-09-02 NOTE — Progress Notes (Signed)
Pre visit review using our clinic review tool, if applicable. No additional management support is needed unless otherwise documented below in the visit note. 

## 2015-09-04 ENCOUNTER — Other Ambulatory Visit: Payer: Self-pay | Admitting: Internal Medicine

## 2015-09-04 ENCOUNTER — Telehealth: Payer: Self-pay | Admitting: *Deleted

## 2015-09-04 NOTE — Telephone Encounter (Signed)
Would not expect a yeast infection to be painful. May need evaluation with her Ob/Gyn if she is having significant pelvic pain.

## 2015-09-04 NOTE — Telephone Encounter (Signed)
Receive call pt stated [facility] was given something for yeast infection, but pt states she is having increase pain. Was told to take the Tizanidide & tramadol to help with pain sxs. Been taking both since Sat not better. Pt is wanting md to rx something else...Jasmine Jackson

## 2015-09-04 NOTE — Telephone Encounter (Signed)
Called pt no answer LMOM with md response.../lmb 

## 2015-10-02 DIAGNOSIS — Z23 Encounter for immunization: Secondary | ICD-10-CM | POA: Diagnosis not present

## 2015-10-27 DIAGNOSIS — F3181 Bipolar II disorder: Secondary | ICD-10-CM | POA: Diagnosis not present

## 2015-11-22 ENCOUNTER — Other Ambulatory Visit: Payer: Self-pay | Admitting: Internal Medicine

## 2015-11-28 DIAGNOSIS — H2513 Age-related nuclear cataract, bilateral: Secondary | ICD-10-CM | POA: Diagnosis not present

## 2015-11-28 DIAGNOSIS — H53002 Unspecified amblyopia, left eye: Secondary | ICD-10-CM | POA: Diagnosis not present

## 2015-11-28 DIAGNOSIS — H04123 Dry eye syndrome of bilateral lacrimal glands: Secondary | ICD-10-CM | POA: Diagnosis not present

## 2015-11-29 ENCOUNTER — Encounter: Payer: Self-pay | Admitting: Internal Medicine

## 2015-11-29 ENCOUNTER — Ambulatory Visit (INDEPENDENT_AMBULATORY_CARE_PROVIDER_SITE_OTHER): Payer: Medicare Other | Admitting: Internal Medicine

## 2015-11-29 ENCOUNTER — Other Ambulatory Visit (INDEPENDENT_AMBULATORY_CARE_PROVIDER_SITE_OTHER): Payer: Medicare Other

## 2015-11-29 VITALS — BP 122/70 | HR 76 | Temp 99.1°F | Resp 16 | Ht 66.0 in | Wt 196.0 lb

## 2015-11-29 DIAGNOSIS — Z8639 Personal history of other endocrine, nutritional and metabolic disease: Secondary | ICD-10-CM

## 2015-11-29 DIAGNOSIS — M545 Low back pain, unspecified: Secondary | ICD-10-CM

## 2015-11-29 DIAGNOSIS — Z8739 Personal history of other diseases of the musculoskeletal system and connective tissue: Secondary | ICD-10-CM

## 2015-11-29 DIAGNOSIS — M25579 Pain in unspecified ankle and joints of unspecified foot: Secondary | ICD-10-CM | POA: Diagnosis not present

## 2015-11-29 DIAGNOSIS — R1013 Epigastric pain: Secondary | ICD-10-CM | POA: Diagnosis not present

## 2015-11-29 LAB — COMPREHENSIVE METABOLIC PANEL
ALT: 16 U/L (ref 0–35)
AST: 16 U/L (ref 0–37)
Albumin: 4.2 g/dL (ref 3.5–5.2)
Alkaline Phosphatase: 61 U/L (ref 39–117)
BILIRUBIN TOTAL: 0.4 mg/dL (ref 0.2–1.2)
BUN: 8 mg/dL (ref 6–23)
CALCIUM: 9.6 mg/dL (ref 8.4–10.5)
CO2: 29 meq/L (ref 19–32)
CREATININE: 0.72 mg/dL (ref 0.40–1.20)
Chloride: 105 mEq/L (ref 96–112)
GFR: 112.45 mL/min (ref >60.00)
GLUCOSE: 91 mg/dL (ref 70–99)
Potassium: 4.7 mEq/L (ref 3.5–5.1)
Sodium: 141 mEq/L (ref 135–145)
Total Protein: 7.2 g/dL (ref 6.0–8.3)

## 2015-11-29 MED ORDER — DICLOFENAC SODIUM 75 MG PO TBEC: 75.0000 mg | DELAYED_RELEASE_TABLET | Freq: Two times a day (BID) | ORAL | Status: DC

## 2015-11-29 MED ORDER — TRAMADOL HCL 50 MG PO TABS: 50.0000 mg | ORAL_TABLET | Freq: Three times a day (TID) | ORAL | Status: DC | PRN

## 2015-11-29 MED ORDER — ONDANSETRON HCL 4 MG PO TABS: 4.0000 mg | ORAL_TABLET | Freq: Two times a day (BID) | ORAL | Status: DC | PRN

## 2015-11-29 MED ORDER — HYOSCYAMINE SULFATE 0.125 MG SL SUBL: 0.1250 mg | SUBLINGUAL_TABLET | Freq: Two times a day (BID) | SUBLINGUAL | Status: DC | PRN

## 2015-11-29 MED ORDER — COLCHICINE 0.6 MG PO TABS: 0.6000 mg | ORAL_TABLET | Freq: Every day | ORAL | Status: DC

## 2015-11-29 MED ORDER — ALBUTEROL SULFATE HFA 108 (90 BASE) MCG/ACT IN AERS: 2.0000 | INHALATION_SPRAY | Freq: Four times a day (QID) | RESPIRATORY_TRACT | Status: DC | PRN

## 2015-11-29 NOTE — Patient Instructions (Signed)
We have refilled the medicines and will check the blood work today.   Come back in about 6 months for a check up and sooner if you have new problems or questions.

## 2015-11-29 NOTE — Progress Notes (Signed)
Pre visit review using our clinic review tool, if applicable. No additional management support is needed unless otherwise documented below in the visit note. 

## 2015-12-01 ENCOUNTER — Telehealth: Payer: Self-pay | Admitting: Internal Medicine

## 2015-12-01 NOTE — Telephone Encounter (Signed)
Did you want to give her patches?

## 2015-12-01 NOTE — Telephone Encounter (Signed)
Pt request to speak to the assistant concern about lab result and also the patch that will her stop smoking. Please give her a call back, she still use [facility] for pharmacy

## 2015-12-02 DIAGNOSIS — M545 Low back pain, unspecified: Secondary | ICD-10-CM | POA: Insufficient documentation

## 2015-12-02 NOTE — Assessment & Plan Note (Signed)
Did give refill of voltaren (advised not to take other NSAIDs with this) and tizanidine for the pain. If not better with conservative treatment she will call back. No indication for imaging today.

## 2015-12-02 NOTE — Progress Notes (Signed)
   Subjective:    Patient ID: Jasmine Jackson, female    DOB: 03-24-1971, 44 y.o.   MRN: [id number]  HPI The patient is a 44 YO female coming in for low back pain. Going on for 1-2 weeks this time and is recurrent. She is out of some of her medicines that normally help when she gets a flare. Denies any activity that could have caused the pain. She does physical activity at her job. No fevers or chills. No weight change. No pain or numbness in her legs. Has tried tylenol and aleve at home without much relief.   Review of Systems  Constitutional: Negative.   HENT: Negative.   Respiratory: Negative.   Cardiovascular: Negative.   Musculoskeletal: Positive for myalgias, back pain and arthralgias.  Skin: Negative.   Neurological: Negative for weakness, numbness and headaches.  Psychiatric/Behavioral: Negative.       Objective:   Physical Exam  Constitutional: She is oriented to person, place, and time. She appears well-developed and well-nourished.  HENT:  Head: Normocephalic and atraumatic.  Eyes: EOM are normal.  Neck: Normal range of motion.  Cardiovascular: Normal rate and regular rhythm.   Pulmonary/Chest: Effort normal. No respiratory distress. She has no wheezes. She has no rales.  Abdominal: Soft. Bowel sounds are normal. She exhibits no distension. There is no tenderness.  Musculoskeletal:  Mild paraspinal tenderness in the lumbar region.  Neurological: She is alert and oriented to person, place, and time. Coordination normal.  Skin: Skin is warm and dry.   Filed Vitals:   11/29/15 0959  BP: 122/70  Pulse: 76  Temp: 99.1 F (37.3 C)  TempSrc: Oral  Resp: 16  Height: 5\' 6"  (1.676 m)  Weight: 196 lb (88.905 kg)  SpO2: 98%      Assessment & Plan:

## 2015-12-04 MED ORDER — NICOTINE 21 MG/24HR TD PT24: 21.0000 mg | MEDICATED_PATCH | Freq: Every day | TRANSDERMAL | Status: DC

## 2015-12-04 NOTE — Telephone Encounter (Signed)
Patches sent in 

## 2015-12-04 NOTE — Telephone Encounter (Signed)
Patient aware and will go pick up 

## 2015-12-05 ENCOUNTER — Telehealth: Payer: Self-pay | Admitting: Internal Medicine

## 2015-12-05 NOTE — Telephone Encounter (Signed)
Spoke with patient and informed her that Dr. Sharlet Salina said this medication is not covered under her insurance and that she checked and no other medications were covered either.

## 2015-12-05 NOTE — Telephone Encounter (Signed)
Patient called to advise that   nicotine (NICODERM CQ - DOSED IN MG/24 HOURS) 21 mg/24hr patch [id number]     Is coming up non covered from insurance. She asks that we try an alternative. Advised that if another on comes up non covered then she should call her insurance to find out one that actually comes up as covered. We will in turn send that one in. Please do try and send one alternative though.

## 2016-01-04 DIAGNOSIS — F3181 Bipolar II disorder: Secondary | ICD-10-CM | POA: Diagnosis not present

## 2016-01-11 ENCOUNTER — Ambulatory Visit (INDEPENDENT_AMBULATORY_CARE_PROVIDER_SITE_OTHER): Payer: Medicare Other | Admitting: Internal Medicine

## 2016-01-11 ENCOUNTER — Encounter: Payer: Self-pay | Admitting: Internal Medicine

## 2016-01-11 VITALS — BP 139/77 | HR 79 | Temp 98.5°F | Ht 66.0 in | Wt 199.3 lb

## 2016-01-11 DIAGNOSIS — Z114 Encounter for screening for human immunodeficiency virus [HIV]: Secondary | ICD-10-CM | POA: Diagnosis not present

## 2016-01-11 DIAGNOSIS — Z8739 Personal history of other diseases of the musculoskeletal system and connective tissue: Secondary | ICD-10-CM

## 2016-01-11 DIAGNOSIS — Z0000 Encounter for general adult medical examination without abnormal findings: Secondary | ICD-10-CM | POA: Diagnosis not present

## 2016-01-11 DIAGNOSIS — R5383 Other fatigue: Secondary | ICD-10-CM

## 2016-01-11 DIAGNOSIS — M62838 Other muscle spasm: Secondary | ICD-10-CM | POA: Diagnosis not present

## 2016-01-11 DIAGNOSIS — N898 Other specified noninflammatory disorders of vagina: Secondary | ICD-10-CM

## 2016-01-11 DIAGNOSIS — Z711 Person with feared health complaint in whom no diagnosis is made: Secondary | ICD-10-CM | POA: Diagnosis not present

## 2016-01-11 DIAGNOSIS — R5381 Other malaise: Secondary | ICD-10-CM

## 2016-01-11 DIAGNOSIS — K648 Other hemorrhoids: Secondary | ICD-10-CM | POA: Diagnosis not present

## 2016-01-11 DIAGNOSIS — M545 Low back pain, unspecified: Secondary | ICD-10-CM

## 2016-01-11 DIAGNOSIS — Z8639 Personal history of other endocrine, nutritional and metabolic disease: Secondary | ICD-10-CM | POA: Diagnosis not present

## 2016-01-11 DIAGNOSIS — Z23 Encounter for immunization: Secondary | ICD-10-CM

## 2016-01-11 DIAGNOSIS — N949 Unspecified condition associated with female genital organs and menstrual cycle: Secondary | ICD-10-CM | POA: Diagnosis not present

## 2016-01-11 DIAGNOSIS — K582 Mixed irritable bowel syndrome: Secondary | ICD-10-CM

## 2016-01-11 DIAGNOSIS — F319 Bipolar disorder, unspecified: Secondary | ICD-10-CM | POA: Diagnosis not present

## 2016-01-11 DIAGNOSIS — R1013 Epigastric pain: Secondary | ICD-10-CM | POA: Diagnosis not present

## 2016-01-11 DIAGNOSIS — K219 Gastro-esophageal reflux disease without esophagitis: Secondary | ICD-10-CM

## 2016-01-11 MED ORDER — TRAMADOL HCL 50 MG PO TABS: 50.0000 mg | ORAL_TABLET | Freq: Three times a day (TID) | ORAL | Status: DC | PRN

## 2016-01-11 MED ORDER — ONDANSETRON HCL 4 MG PO TABS: 4.0000 mg | ORAL_TABLET | Freq: Two times a day (BID) | ORAL | Status: DC | PRN

## 2016-01-11 MED ORDER — FLUCONAZOLE 150 MG PO TABS: 150.0000 mg | ORAL_TABLET | Freq: Once | ORAL | Status: DC

## 2016-01-11 MED ORDER — HYDROCORTISONE ACE-PRAMOXINE 2.5-1 % RE CREA: 1.0000 "application " | TOPICAL_CREAM | Freq: Three times a day (TID) | RECTAL | Status: DC

## 2016-01-11 NOTE — Assessment & Plan Note (Addendum)
HPI: She reports that she had her internal hemorrhoid banded years ago but they have now returned.  Analpram has been effective in the past.  A: Internal hemorrhoids  P: Rx Analpram

## 2016-01-11 NOTE — Assessment & Plan Note (Signed)
HPI: She reports a history of IBS- Mixed type.  She has followed with Dr Fuller Plan (GI) and last had Colonoscopy and EGD in 2014.  Last TSH that I seen on file was 0.59 in 2009.  She treats IBS with bentyl 10mg  QID and Levsin rarely if cramping is severe.  A: IBS, stable  P: Continue bentyl, levsin

## 2016-01-11 NOTE — Patient Instructions (Signed)
General Instructions:   Please bring your medicines with you each time you come to clinic.  Medicines may include prescription medications, over-the-counter medications, herbal remedies, eye drops, vitamins, or other pills.   Progress Toward Treatment Goals:  No flowsheet data found.  Self Care Goals & Plans:  Self Care Goal 01/11/2016  Manage my medications take my medicines as prescribed; bring my medications to every visit; refill my medications on time  Eat healthy foods eat more vegetables; eat foods that are low in salt; eat baked foods instead of fried foods  Be physically active find an activity I enjoy; take a walk every day  Stop smoking call [facility] (1-800-QUIT-NOW)    No flowsheet data found.   Care Management & Community Referrals:  No flowsheet data found.

## 2016-01-11 NOTE — Assessment & Plan Note (Signed)
HPI: Reports <2 attacks per year. Takes colchicine PRN for gout attacks  A: Hx of Gout  P: No need for chronic urate lowering therapy at this time -Continue colchicine PRN

## 2016-01-11 NOTE — Assessment & Plan Note (Signed)
HPI: takes omeprazole 40mg  daily  A: GERD, stable  P: - Continue omeprazole

## 2016-01-11 NOTE — Progress Notes (Signed)
[facility] Subjective:   Patient ID: Jasmine Jackson female   DOB: [birth year] 45 y.o.   MRN: [id number]  HPI: [name] is a 45 y.o. female with a PMH detailed below who presents as a new patient to establish care with the [facility]. OVerall she is feeling very well today.  She does report that she has a creamy white vaginal discharge without any itching.  She is sexually active.  Please see problem based charting below for the status of her chronic medical problems.  Past Medical History  Diagnosis Date  . Heart murmur   . Anemia   . Anxiety and depression   . Hemorrhoids   . Asthma   . Bipolar 1 disorder ([place])   . GERD (gastroesophageal reflux disease)   . Internal hemorrhoids   . Anxiety   . Depression   . Hiatal hernia    Current Outpatient Prescriptions  Medication Sig Dispense Refill  . albuterol (PROVENTIL HFA;VENTOLIN HFA) 108 (90 BASE) MCG/ACT inhaler Inhale 2 puffs into the lungs every 6 (six) hours as needed for wheezing or shortness of breath. 3 Inhaler 3  . busPIRone (BUSPAR) 15 MG tablet Take 30 mg by mouth 2 (two) times daily.    . colchicine (COLCRYS) 0.6 MG tablet Take 1 tablet (0.6 mg total) by mouth daily. 90 tablet 3  . diclofenac (VOLTAREN) 75 MG EC tablet Take 1 tablet (75 mg total) by mouth 2 (two) times daily. 180 tablet 1  . dicyclomine (BENTYL) 10 MG capsule Take 1 capsule (10 mg total) by mouth 4 (four) times daily -  before meals and at bedtime. 120 capsule 11  . fluconazole (DIFLUCAN) 150 MG tablet Take 1 tablet (150 mg total) by mouth once. 1 tablet 0  . FLUoxetine (PROZAC) 40 MG capsule 40 mg. Take two capsules every morning    . gabapentin (NEURONTIN) 600 MG tablet Take 600 mg by mouth 3 (three) times daily.    . hydrocortisone-pramoxine (ANALPRAM HC) 2.5-1 % rectal cream Place 1 application rectally 3 (three) times daily. 30 g 2  . hydrOXYzine (VISTARIL) 25 MG capsule     . hyoscyamine (LEVSIN SL) 0.125 MG SL tablet Take  1 tablet (0.125 mg total) by mouth 2 (two) times daily as needed. 60 tablet 0  . nicotine (NICODERM CQ - DOSED IN MG/24 HOURS) 21 mg/24hr patch Place 1 patch (21 mg total) onto the skin daily. 28 patch 0  . omeprazole (PRILOSEC) 40 MG capsule TAKE ONE CAPSULE BY MOUTH TWICE DAILY 180 capsule 1  . ondansetron (ZOFRAN) 4 MG tablet Take 1 tablet (4 mg total) by mouth 2 (two) times daily as needed for nausea or vomiting. 60 tablet 2  . SEROQUEL XR 400 MG 24 hr tablet     . tiZANidine (ZANAFLEX) 2 MG tablet TAKE 1 TABLET(2 MG) BY MOUTH EVERY 8 HOURS AS NEEDED 270 tablet 3  . traMADol (ULTRAM) 50 MG tablet Take 1 tablet (50 mg total) by mouth every 8 (eight) hours as needed. 30 tablet 0   No current facility-administered medications for this visit.   Family History  Problem Relation Age of Onset  . Breast cancer Maternal Aunt     x4  . Stomach cancer Maternal Aunt   . Prostate cancer Maternal Uncle     x2  . Breast cancer Maternal Grandmother   . Celiac disease Mother   . Colon cancer Neg Hx   . Esophageal cancer Neg Hx   .  Rectal cancer Neg Hx    Social History   Social History  . Marital Status: Single    Spouse Name: N/A  . Number of Children: 1  . Years of Education: N/A   Occupational History  . Disability    Social History Main Topics  . Smoking status: Current Every Day Smoker -- 0.50 packs/day    Types: Cigarettes  . Smokeless tobacco: Never Used     Comment: 1 pack every 3 days  . Alcohol Use: Yes     Comment: rare occasions  . Drug Use: No  . Sexual Activity: Not Asked   Other Topics Concern  . None   Social History Narrative   Review of Systems: Review of Systems  Constitutional: Positive for malaise/fatigue (mild fatigue). Negative for fever, chills and weight loss.  Eyes: Negative for blurred vision.  Respiratory: Negative for cough and shortness of breath.   Cardiovascular: Negative for chest pain and leg swelling.  Gastrointestinal: Positive for  heartburn (occ), nausea (occ takes zofran), diarrhea and constipation. Negative for vomiting, abdominal pain, blood in stool and melena.  Genitourinary: Negative for dysuria.  Musculoskeletal: Negative for myalgias.  Neurological: Negative for dizziness and headaches.  Psychiatric/Behavioral: Negative for depression.     Objective:  Physical Exam: Filed Vitals:   01/11/16 1016  BP: 139/77  Pulse: 79  Temp: 98.5 F (36.9 C)  TempSrc: Oral  Height: 5\' 6"  (1.676 m)  Weight: 199 lb 4.8 oz (90.402 kg)  SpO2: 100%  Physical Exam  Constitutional: She is well-developed, well-nourished, and in no distress.  HENT:  Head: Normocephalic and atraumatic.  Cardiovascular: Normal rate and regular rhythm.   Pulmonary/Chest: Effort normal and breath sounds normal.  Abdominal: Soft. Bowel sounds are normal.  Genitourinary: Odorless  white and vaginal discharge found.  Lela as chaperone. Unable to locate cervical OS on speculum exam  Musculoskeletal: She exhibits no edema.  Psychiatric: Mood and affect normal.  Nursing note and vitals reviewed.   Assessment & Plan:  Case discussed with Dr. Eppie Gibson  Internal hemorrhoids without complication HPI: She reports that she had her internal hemorrhoid banded years ago but they have now returned.  Analpram has been effective in the past.  A: Internal hemorrhoids  P: Rx Analpram  Irritable bowel syndrome HPI: She reports a history of IBS- Mixed type.  She has followed with Dr Fuller Plan (GI) and last had Colonoscopy and EGD in 2014.  Last TSH that I seen on file was 0.59 in 2009.  She treats IBS with bentyl 10mg  QID and Levsin rarely if cramping is severe.  A: IBS, stable  P: Continue bentyl, levsin  Esophageal reflux HPI: takes omeprazole 40mg  daily  A: GERD, stable  P: - Continue omeprazole  History of gout HPI: Reports <2 attacks per year. Takes colchicine PRN for gout attacks  A: Hx of Gout  P: No need for chronic urate lowering therapy at  this time -Continue colchicine PRN  Bipolar disorder, unspecified HPI: Reports history of bipolar disorder, currently well managed by [facility].  She takes buspirone, fluoxetine and seroquel  A: Bipolar disorder  P:  Continue current medications -Will attempt to obtain records from [facility]  Muscle spasm HPI: She reports a history of muscle spasm (stomach, back, legs) which are responsive to tizanidine.  A: Muscle spasms  P: continue tizanidine    Vaginal discharge HPI: she has had a few months of increased vaginal discharge, she has been elevated by her previous PCP who completed STD  testing.  A: Vaginal discharge  P: On exam she has a white creamy discharge, unfortunately i knocked over the swab sample after it was collected and had to be discarded.  I called and apologized to the patient for this.  Given she has already had previous STD testing for this I am less concerned for STD (I did obtain an HIV test). - Will treat for empiric yeast infection with a single dose of fluconazole.  Low back pain HPI: she has chronic low back pain, this is treated with tizanidine, diclofenac, and occasional tramadol.  A: Chronic low back pain  P: Cotinue muslce relaxer, NSAID, I have recommend very sparing use of tramadol as this may worsen her IBS  Health care maintenance - Requested Tdap - She reported partial hysterectomy, I attempted a PAP smear but could not locate the cervical os,  upon further review the "partial" may have just been that her ovaries were not removed and she did indeed have a total.  This is not completely clear to me so I have requested she complete a records request form.  Her hysterectomy is repordly due to fibroid and if it was total she would not need further pap smears.    Medications Ordered Meds ordered this encounter  Medications  . hydrocortisone-pramoxine (ANALPRAM HC) 2.5-1 % rectal cream    Sig: Place 1 application rectally 3 (three) times daily.     Dispense:  30 g    Refill:  2  . ondansetron (ZOFRAN) 4 MG tablet    Sig: Take 1 tablet (4 mg total) by mouth 2 (two) times daily as needed for nausea or vomiting.    Dispense:  60 tablet    Refill:  2  . traMADol (ULTRAM) 50 MG tablet    Sig: Take 1 tablet (50 mg total) by mouth every 8 (eight) hours as needed.    Dispense:  30 tablet    Refill:  0  . fluconazole (DIFLUCAN) 150 MG tablet    Sig: Take 1 tablet (150 mg total) by mouth once.    Dispense:  1 tablet    Refill:  0   Other Orders Orders Placed This Encounter  Procedures  . Tdap vaccine greater than or equal to 7yo IM  . HIV antibody (with reflex)   Follow Up: No Follow-up on file.

## 2016-01-11 NOTE — Assessment & Plan Note (Addendum)
HPI: Reports history of bipolar disorder, currently well managed by [facility].  She takes buspirone, fluoxetine and seroquel  A: Bipolar disorder  P:  Continue current medications -Will attempt to obtain records from [facility]

## 2016-01-12 LAB — HIV ANTIBODY (ROUTINE TESTING W REFLEX): HIV Screen 4th Generation wRfx: NONREACTIVE

## 2016-01-12 NOTE — Assessment & Plan Note (Signed)
-   Requested Tdap - She reported partial hysterectomy, I attempted a PAP smear but could not locate the cervical os,  upon further review the "partial" may have just been that her ovaries were not removed and she did indeed have a total.  This is not completely clear to me so I have requested she complete a records request form.  Her hysterectomy is repordly due to fibroid and if it was total she would not need further pap smears.

## 2016-01-12 NOTE — Assessment & Plan Note (Addendum)
HPI: She reports a history of muscle spasm (stomach, back, legs) which are responsive to tizanidine.  A: Muscle spasms  P: continue tizanidine

## 2016-01-12 NOTE — Assessment & Plan Note (Signed)
HPI: she has had a few months of increased vaginal discharge, she has been elevated by her previous PCP who completed STD testing.  A: Vaginal discharge  P: On exam she has a white creamy discharge, unfortunately i knocked over the swab sample after it was collected and had to be discarded.  I called and apologized to the patient for this.  Given she has already had previous STD testing for this I am less concerned for STD (I did obtain an HIV test). - Will treat for empiric yeast infection with a single dose of fluconazole.

## 2016-01-12 NOTE — Assessment & Plan Note (Signed)
HPI: she has chronic low back pain, this is treated with tizanidine, diclofenac, and occasional tramadol.  A: Chronic low back pain  P: Cotinue muslce relaxer, NSAID, I have recommend very sparing use of tramadol as this may worsen her IBS

## 2016-01-15 NOTE — Progress Notes (Signed)
Case discussed with Dr. Hoffman soon after the resident saw the patient.  We reviewed the resident's history and exam and pertinent patient test results.  I agree with the assessment, diagnosis, and plan of care documented in the resident's note. 

## 2016-01-15 NOTE — Addendum Note (Signed)
Addended by: Oval Linsey D on: 01/15/2016 04:56 PM   Modules accepted: Level of Service

## 2016-02-12 ENCOUNTER — Ambulatory Visit (HOSPITAL_COMMUNITY): Payer: Medicare Other | Admitting: Psychiatry

## 2016-03-04 ENCOUNTER — Other Ambulatory Visit: Payer: Self-pay | Admitting: Internal Medicine

## 2016-03-05 ENCOUNTER — Other Ambulatory Visit: Payer: Self-pay | Admitting: Internal Medicine

## 2016-03-05 MED ORDER — DICYCLOMINE HCL 10 MG PO CAPS: 10.0000 mg | ORAL_CAPSULE | Freq: Three times a day (TID) | ORAL | Status: DC

## 2016-03-05 MED ORDER — TIZANIDINE HCL 2 MG PO TABS: 2.0000 mg | ORAL_TABLET | Freq: Three times a day (TID) | ORAL | Status: DC | PRN

## 2016-03-05 MED ORDER — OMEPRAZOLE 40 MG PO CPDR: 40.0000 mg | DELAYED_RELEASE_CAPSULE | Freq: Two times a day (BID) | ORAL | Status: DC

## 2016-03-05 NOTE — Telephone Encounter (Signed)
Refilled 90 day supply not 1 year supply as requested

## 2016-03-05 NOTE — Telephone Encounter (Signed)
Pt requesting dicyclomine to be filled @ [facility] on [address].

## 2016-03-05 NOTE — Telephone Encounter (Signed)
Pt needs refills sent to [facility] tiZANidine (ZANAFLEX) 2 MG tablet and omeprazole (PRILOSEC) 40 MG capsule    pt also never got results for bloodwork

## 2016-03-06 ENCOUNTER — Other Ambulatory Visit: Payer: Self-pay | Admitting: Internal Medicine

## 2016-03-21 ENCOUNTER — Ambulatory Visit (HOSPITAL_COMMUNITY): Payer: Medicare Other | Admitting: Psychiatry

## 2016-04-08 ENCOUNTER — Ambulatory Visit: Payer: Medicare Other | Admitting: Internal Medicine

## 2016-04-11 ENCOUNTER — Emergency Department (HOSPITAL_COMMUNITY)
Admission: EM | Admit: 2016-04-11 | Discharge: 2016-04-11 | Disposition: A | Discharge status: Home / Self Care | Payer: Medicare Other | Attending: Emergency Medicine | Admitting: Emergency Medicine

## 2016-04-11 ENCOUNTER — Encounter (HOSPITAL_COMMUNITY): Payer: Self-pay

## 2016-04-11 ENCOUNTER — Ambulatory Visit: Payer: Medicare Other | Admitting: Internal Medicine

## 2016-04-11 DIAGNOSIS — F319 Bipolar disorder, unspecified: Secondary | ICD-10-CM | POA: Diagnosis not present

## 2016-04-11 DIAGNOSIS — R42 Dizziness and giddiness: Secondary | ICD-10-CM | POA: Insufficient documentation

## 2016-04-11 DIAGNOSIS — Z79899 Other long term (current) drug therapy: Secondary | ICD-10-CM | POA: Insufficient documentation

## 2016-04-11 DIAGNOSIS — K219 Gastro-esophageal reflux disease without esophagitis: Secondary | ICD-10-CM | POA: Insufficient documentation

## 2016-04-11 DIAGNOSIS — R51 Headache: Secondary | ICD-10-CM | POA: Insufficient documentation

## 2016-04-11 DIAGNOSIS — J45909 Unspecified asthma, uncomplicated: Secondary | ICD-10-CM | POA: Diagnosis not present

## 2016-04-11 DIAGNOSIS — Z87891 Personal history of nicotine dependence: Secondary | ICD-10-CM | POA: Insufficient documentation

## 2016-04-11 DIAGNOSIS — Z79891 Long term (current) use of opiate analgesic: Secondary | ICD-10-CM | POA: Insufficient documentation

## 2016-04-11 DIAGNOSIS — Z791 Long term (current) use of non-steroidal anti-inflammatories (NSAID): Secondary | ICD-10-CM | POA: Insufficient documentation

## 2016-04-11 DIAGNOSIS — Z7951 Long term (current) use of inhaled steroids: Secondary | ICD-10-CM | POA: Insufficient documentation

## 2016-04-11 DIAGNOSIS — R519 Headache, unspecified: Secondary | ICD-10-CM

## 2016-04-11 MED ORDER — MECLIZINE HCL 25 MG PO TABS: 25.0000 mg | ORAL_TABLET | Freq: Two times a day (BID) | ORAL | Status: DC

## 2016-04-11 MED ORDER — MECLIZINE HCL 25 MG PO TABS
25.0000 mg | ORAL_TABLET | Freq: Two times a day (BID) | ORAL | Status: DC
Start: 2016-04-11 — End: 2016-04-11

## 2016-04-11 MED ORDER — ONDANSETRON 8 MG PO TBDP
8.0000 mg | ORAL_TABLET | Freq: Once | ORAL | Status: AC
  Administered 2016-04-11: 8 mg via ORAL
  Filled 2016-04-11: qty 1

## 2016-04-11 MED ORDER — MECLIZINE HCL 25 MG PO TABS
25.0000 mg | ORAL_TABLET | Freq: Once | ORAL | Status: AC
  Administered 2016-04-11: 25 mg via ORAL
  Filled 2016-04-11: qty 1

## 2016-04-11 MED ORDER — DIPHENHYDRAMINE HCL 50 MG/ML IJ SOLN
25.0000 mg | Freq: Once | INTRAMUSCULAR | Status: AC
  Administered 2016-04-11: 25 mg via INTRAVENOUS
  Filled 2016-04-11: qty 1

## 2016-04-11 MED ORDER — ONDANSETRON 8 MG PO TBDP: 8.0000 mg | ORAL_TABLET | Freq: Once | ORAL | Status: DC

## 2016-04-11 MED ORDER — KETOROLAC TROMETHAMINE 30 MG/ML IJ SOLN
30.0000 mg | Freq: Once | INTRAMUSCULAR | Status: AC
  Administered 2016-04-11: 30 mg via INTRAVENOUS
  Filled 2016-04-11: qty 1

## 2016-04-11 MED ORDER — METOCLOPRAMIDE HCL 5 MG/ML IJ SOLN
10.0000 mg | Freq: Once | INTRAMUSCULAR | Status: AC
  Administered 2016-04-11: 10 mg via INTRAVENOUS
  Filled 2016-04-11: qty 2

## 2016-04-11 MED ORDER — SODIUM CHLORIDE 0.9 % IV BOLUS (SEPSIS)
1000.0000 mL | Freq: Once | INTRAVENOUS | Status: AC
  Administered 2016-04-11: 1000 mL via INTRAVENOUS

## 2016-04-11 NOTE — ED Notes (Signed)
Pt continually asking for provider.  Janett Billow PA made aware and informed family that she would be in momentarily.

## 2016-04-11 NOTE — ED Notes (Signed)
Pt c/o headache, dizziness, and dry heaves starting yesterday.  Pain score 7/10.  Pt reports dizziness increases w/ turning head.  Denies abdominal pain, emesis, and diarrhea.  Pt reports symptoms started after a dentist appointment yesterday.

## 2016-04-11 NOTE — ED Notes (Signed)
Bed: [id number] Expected date:  Expected time:  Means of arrival:  Comments: Hold for triage 5

## 2016-04-11 NOTE — ED Notes (Signed)
PA at bedside.

## 2016-04-11 NOTE — ED Notes (Signed)
Per EMS- Patient c/o headache, dizziness, and N/V.

## 2016-04-11 NOTE — ED Provider Notes (Signed)
CSN: [id number]     Arrival date & time 04/11/16  1114 History   First MD Initiated Contact with Patient 04/11/16 1251     Chief Complaint  Patient presents with  . Headache  . Dizziness     (Consider location/radiation/quality/duration/timing/severity/associated sxs/prior Treatment) HPI Comments: Patient is a 45 y/o female with a history of anxiety, depression, asthma, biploar 1 presents to the ED with dizziness and headache since last night. Headache is a right temporal, 8/10, intermittent, sharp. She states she has had headaches before but not often. Also states fluctuating between being cold and hot. The dizziness is worse with head movement, standing, changing positions with associated nausea and dry heaves. Patient states she had dental work done yesterday and these symptoms began a few hours after. She took a tramadol and a zofran with no relief. She was unable to take her morning meds due to her nausea. She denies hearing changes, ear pain, discharge, visual changes, sick contact, recent illness, fever, diarrhea, vomiting. Her mother has a history of vertigo.  Patient is a 45 y.o. female presenting with headaches and dizziness. The history is provided by the patient.  Headache Associated symptoms: dizziness and nausea   Associated symptoms: no abdominal pain, no congestion, no cough, no diarrhea, no ear pain, no eye pain, no fatigue, no fever, no hearing loss, no myalgias, no numbness, no sore throat, no vomiting and no weakness   Dizziness Associated symptoms: headaches and nausea   Associated symptoms: no blood in stool, no chest pain, no diarrhea, no hearing loss, no shortness of breath, no vomiting and no weakness     Past Medical History  Diagnosis Date  . Heart murmur   . Anemia   . Anxiety and depression   . Hemorrhoids   . Asthma   . Bipolar 1 disorder ([place])   . GERD (gastroesophageal reflux disease)   . Internal hemorrhoids   . Anxiety   . Depression   . Hiatal  hernia    Past Surgical History  Procedure Laterality Date  . Abdominal hysterectomy      fibroids  . Tonsillectomy    . Inguinal hernia repair    . Hemorrhoid surgery    . Tubal ligation     Family History  Problem Relation Age of Onset  . Breast cancer Maternal Aunt     x4  . Stomach cancer Maternal Aunt   . Prostate cancer Maternal Uncle     x2  . Breast cancer Maternal Grandmother   . Celiac disease Mother   . Colon cancer Neg Hx   . Esophageal cancer Neg Hx   . Rectal cancer Neg Hx    Social History  Substance Use Topics  . Smoking status: Former Smoker -- 0.50 packs/day    Types: Cigarettes  . Smokeless tobacco: Never Used     Comment: 1 pack every 3 days  . Alcohol Use: Yes     Comment: rare occasions   OB History    No data available     Review of Systems  Constitutional: Positive for chills. Negative for fever, appetite change and fatigue.  HENT: Negative for congestion, ear discharge, ear pain, hearing loss, sore throat and trouble swallowing.   Eyes: Negative for pain and visual disturbance.  Respiratory: Negative for cough, chest tightness and shortness of breath.   Cardiovascular: Negative for chest pain and leg swelling.  Gastrointestinal: Positive for nausea. Negative for vomiting, abdominal pain, diarrhea and blood in stool.  Genitourinary:  Negative for dysuria, hematuria, vaginal discharge and difficulty urinating.  Musculoskeletal: Negative for myalgias and arthralgias.  Skin: Negative for rash.  Neurological: Positive for dizziness and headaches. Negative for syncope, speech difficulty, weakness and numbness.  Psychiatric/Behavioral: Negative for confusion.      Allergies  Morphine and related  Home Medications   Prior to Admission medications   Medication Sig Start Date End Date Taking? Authorizing Provider  albuterol (PROVENTIL HFA;VENTOLIN HFA) 108 (90 BASE) MCG/ACT inhaler Inhale 2 puffs into the lungs every 6 (six) hours as needed  for wheezing or shortness of breath. 11/29/15  Yes Hoyt Koch, MD  busPIRone (BUSPAR) 15 MG tablet Take 30 mg by mouth 2 (two) times daily.   Yes Historical Provider, MD  colchicine (COLCRYS) 0.6 MG tablet Take 1 tablet (0.6 mg total) by mouth daily. Patient taking differently: Take 0.6 mg by mouth daily as needed (gout.).  11/29/15  Yes Hoyt Koch, MD  diclofenac (VOLTAREN) 75 MG EC tablet Take 1 tablet (75 mg total) by mouth 2 (two) times daily. Patient taking differently: Take 75 mg by mouth 2 (two) times daily as needed for mild pain.  11/29/15  Yes Hoyt Koch, MD  dicyclomine (BENTYL) 10 MG capsule Take 1 capsule (10 mg total) by mouth 4 (four) times daily -  before meals and at bedtime. 03/05/16  Yes Lucious Groves, DO  FLUoxetine (PROZAC) 40 MG capsule Take 80 mg by mouth every morning. Take two capsules every morning   Yes Historical Provider, MD  gabapentin (NEURONTIN) 800 MG tablet Take 800 mg by mouth 3 (three) times daily. 04/01/16  Yes Historical Provider, MD  hydrOXYzine (VISTARIL) 25 MG capsule Take 25 mg by mouth 3 (three) times daily as needed for itching.  08/31/15  Yes Historical Provider, MD  hyoscyamine (LEVSIN SL) 0.125 MG SL tablet Take 1 tablet (0.125 mg total) by mouth 2 (two) times daily as needed. Patient taking differently: Take 0.125 mg by mouth 2 (two) times daily as needed for cramping.  11/29/15  Yes Hoyt Koch, MD  omeprazole (PRILOSEC) 40 MG capsule Take 1 capsule (40 mg total) by mouth 2 (two) times daily. 03/05/16  Yes Lucious Groves, DO  ondansetron (ZOFRAN) 4 MG tablet Take 1 tablet (4 mg total) by mouth 2 (two) times daily as needed for nausea or vomiting. 01/11/16  Yes Lucious Groves, DO  QUEtiapine (SEROQUEL) 25 MG tablet Take 25 mg by mouth 3 (three) times daily as needed. For severe agitation during the day. 04/01/16  Yes Historical Provider, MD  SEROQUEL XR 400 MG 24 hr tablet Take 400 mg by mouth at bedtime.  08/31/15  Yes  Historical Provider, MD  tiZANidine (ZANAFLEX) 2 MG tablet Take 1 tablet (2 mg total) by mouth every 8 (eight) hours as needed for muscle spasms. 03/05/16  Yes Lucious Groves, DO  traMADol (ULTRAM) 50 MG tablet Take 1 tablet (50 mg total) by mouth every 8 (eight) hours as needed. Patient taking differently: Take 50 mg by mouth every 8 (eight) hours as needed for moderate pain.  01/11/16  Yes Lucious Groves, DO  fluconazole (DIFLUCAN) 150 MG tablet Take 1 tablet (150 mg total) by mouth once. Patient not taking: Reported on 04/11/2016 01/11/16   Lucious Groves, DO  hydrocortisone-pramoxine [facility]) 2.5-1 % rectal cream Place 1 application rectally 3 (three) times daily. Patient not taking: Reported on 04/11/2016 01/11/16   Lucious Groves, DO  meclizine (ANTIVERT) 25  MG tablet Take 1 tablet (25 mg total) by mouth 2 (two) times daily. 04/11/16   Kalman Drape, PA  nicotine (NICODERM CQ - DOSED IN MG/24 HOURS) 21 mg/24hr patch Place 1 patch (21 mg total) onto the skin daily. Patient not taking: Reported on 04/11/2016 12/04/15   Hoyt Koch, MD  ondansetron (ZOFRAN-ODT) 8 MG disintegrating tablet Take 1 tablet (8 mg total) by mouth once. 04/11/16   Jessica L Focht, PA   BP 147/62 mmHg  Pulse 57  Temp(Src) 97.5 F (36.4 C) (Oral)  Resp 18  Ht 5\' 6"  (1.676 m)  Wt 90.719 kg  BMI 32.30 kg/m2  SpO2 98% Physical Exam  Constitutional: She is oriented to person, place, and time. She appears well-developed and well-nourished. No distress.  HENT:  Head: Normocephalic and atraumatic.  Right Ear: Tympanic membrane, external ear and ear canal normal.  Left Ear: Tympanic membrane, external ear and ear canal normal.  Mouth/Throat: Uvula is midline, oropharynx is clear and moist and mucous membranes are normal.  Eyes: Conjunctivae and EOM are normal. Pupils are equal, round, and reactive to light. Right eye exhibits no nystagmus. Left eye exhibits no nystagmus.  Neck: Normal range of motion.   Cardiovascular: Normal rate, regular rhythm and normal heart sounds.   Pulses:      Dorsalis pedis pulses are 2+ on the right side, and 2+ on the left side.  Pulmonary/Chest: Effort normal and breath sounds normal.  Abdominal: Normal appearance and bowel sounds are normal. She exhibits no distension. There is no tenderness.  Musculoskeletal: Normal range of motion. She exhibits no edema.  Neurological: She is alert and oriented to person, place, and time. She has normal strength. No cranial nerve deficit or sensory deficit. She exhibits normal muscle tone. Coordination and gait normal.  Coordination intact with finger to nose, negative pronator drift, normal gait, no nystagmus noted,     ED Course  Procedures (including critical care time) Labs Review Labs Reviewed - No data to display  Imaging Review No results found. I have personally reviewed and evaluated these images and lab results as part of my medical decision-making.   EKG Interpretation None      MDM   Final diagnoses:  Vertigo  Nonintractable headache, unspecified chronicity pattern, unspecified headache type    Pt with HA and symptoms of vertigo. Pt HA treated with a migraine cocktail and improved while in ED.  Presentation is not concerning for [facility], ICH, Meningitis, or temporal arteritis. Pt is afebrile with no focal neuro deficits, nuchal rigidity, or change in vision. Pt is to follow up with PCP. Did not know the name of her PCP but stated she would call him tomorrow to follow up. Pt was given Antivert in the ED and her symptoms improved. I had her walk before discharge and she stated her dizziness and nausea improved greatly and she was ready to go home.   Gave pt Antivert and zofran at discharge and I instructed pt to follow up with her PCP tomorrow. I gave her strict return precautions. Pt verbalizes understanding and is agreeable with plan to [place], PA 04/12/16 [id number]  Forde Dandy,  MD 04/12/16 [phone]

## 2016-04-11 NOTE — Discharge Instructions (Signed)
Follow up with your primary care provider tomorrow.  Return to the emergency department if your symptoms worsen or you experience vomiting, diarrhea, abdominal pain, weakness, numbness, or if you pass out.  Benign Positional Vertigo Vertigo is the feeling that you or your surroundings are moving when they are not. Benign positional vertigo is the most common form of vertigo. The cause of this condition is not serious (is benign). This condition is triggered by certain movements and positions (is positional). This condition can be dangerous if it occurs while you are doing something that could endanger you or others, such as driving.  CAUSES In many cases, the cause of this condition is not known. It may be caused by a disturbance in an area of the inner ear that helps your brain to sense movement and balance. This disturbance can be caused by a viral infection (labyrinthitis), head injury, or repetitive motion. RISK FACTORS This condition is more likely to develop in:  Women.  People who are 55 years of age or older. SYMPTOMS Symptoms of this condition usually happen when you move your head or your eyes in different directions. Symptoms may start suddenly, and they usually last for less than a minute. Symptoms may include:  Loss of balance and falling.  Feeling like you are spinning or moving.  Feeling like your surroundings are spinning or moving.  Nausea and vomiting.  Blurred vision.  Dizziness.  Involuntary eye movement (nystagmus). Symptoms can be mild and cause only slight annoyance, or they can be severe and interfere with daily life. Episodes of benign positional vertigo may return (recur) over time, and they may be triggered by certain movements. Symptoms may improve over time. DIAGNOSIS This condition is usually diagnosed by medical history and a physical exam of the head, neck, and ears. You may be referred to a health care provider who specializes in ear, nose, and throat  (ENT) problems (otolaryngologist) or a provider who specializes in disorders of the nervous system (neurologist). You may have additional testing, including:  MRI.  A CT scan.  Eye movement tests. Your health care provider may ask you to change positions quickly while he or she watches you for symptoms of benign positional vertigo, such as nystagmus. Eye movement may be tested with an electronystagmogram (ENG), caloric stimulation, the Dix-Hallpike test, or the roll test.  An electroencephalogram (EEG). This records electrical activity in your brain.  Hearing tests. TREATMENT Usually, your health care provider will treat this by moving your head in specific positions to adjust your inner ear back to normal. Surgery may be needed in severe cases, but this is rare. In some cases, benign positional vertigo may resolve on its own in 2-4 weeks. HOME CARE INSTRUCTIONS Safety  Move slowly.Avoid sudden body or head movements.  Avoid driving.  Avoid operating heavy machinery.  Avoid doing any tasks that would be dangerous to you or others if a vertigo episode would occur.  If you have trouble walking or keeping your balance, try using a cane for stability. If you feel dizzy or unstable, sit down right away.  Return to your normal activities as told by your health care provider. Ask your health care provider what activities are safe for you. General Instructions  Take over-the-counter and prescription medicines only as told by your health care provider.  Avoid certain positions or movements as told by your health care provider.  Drink enough fluid to keep your urine clear or pale yellow.  Keep all follow-up visits as  told by your health care provider. This is important. SEEK MEDICAL CARE IF:  You have a fever.  Your condition gets worse or you develop new symptoms.  Your family or friends notice any behavioral changes.  Your nausea or vomiting gets worse.  You have numbness or a  "pins and needles" sensation. SEEK IMMEDIATE MEDICAL CARE IF:  You have difficulty speaking or moving.  You are always dizzy.  You faint.  You develop severe headaches.  You have weakness in your legs or arms.  You have changes in your hearing or vision.  You develop a stiff neck.  You develop sensitivity to light.   This information is not intended to replace advice given to you by your health care provider. Make sure you discuss any questions you have with your health care provider.   Document Released: 09/16/2006 Document Revised: 08/30/2015 Document Reviewed: 04/03/2015 Elsevier Interactive Patient Education [employer].

## 2016-04-17 ENCOUNTER — Ambulatory Visit (INDEPENDENT_AMBULATORY_CARE_PROVIDER_SITE_OTHER): Payer: Medicare Other | Admitting: Internal Medicine

## 2016-04-17 ENCOUNTER — Other Ambulatory Visit (HOSPITAL_COMMUNITY)
Admission: RE | Admit: 2016-04-17 | Discharge: 2016-04-17 | Disposition: A | Discharge status: Home / Self Care | Payer: Medicare Other | Source: Ambulatory Visit | Attending: Internal Medicine | Admitting: Internal Medicine

## 2016-04-17 ENCOUNTER — Encounter: Payer: Self-pay | Admitting: Internal Medicine

## 2016-04-17 VITALS — BP 143/89 | HR 70 | Temp 98.3°F | Ht 66.0 in | Wt 203.0 lb

## 2016-04-17 DIAGNOSIS — Z113 Encounter for screening for infections with a predominantly sexual mode of transmission: Secondary | ICD-10-CM | POA: Diagnosis not present

## 2016-04-17 DIAGNOSIS — N760 Acute vaginitis: Secondary | ICD-10-CM | POA: Diagnosis not present

## 2016-04-17 DIAGNOSIS — N898 Other specified noninflammatory disorders of vagina: Secondary | ICD-10-CM

## 2016-04-17 DIAGNOSIS — Z87891 Personal history of nicotine dependence: Secondary | ICD-10-CM | POA: Diagnosis not present

## 2016-04-17 DIAGNOSIS — R030 Elevated blood-pressure reading, without diagnosis of hypertension: Secondary | ICD-10-CM | POA: Diagnosis not present

## 2016-04-17 NOTE — Assessment & Plan Note (Addendum)
BP slightly elevated today at 143/89.  Does not take any meds for BP.  Looking back at her BP trend it appears she may need to be on BP meds.  -will return in 1 month for BP f/u

## 2016-04-17 NOTE — Assessment & Plan Note (Addendum)
Pt has a white vaginal discharge that is not burning or itchy.  She has experienced relief from diflucan previously.  No new sexual partners. -UA, urine cytology

## 2016-04-17 NOTE — Patient Instructions (Signed)
Thank you for your visit today.   Please return to the internal medicine clinic in 1 month(s) or sooner if needed.     I have made the following additions/changes to your medications:  Please continue your current medications. I will check your urine for abnormalities.   Please be sure to bring all of your medications with you to every visit; this includes herbal supplements, vitamins, eye drops, and any over-the-counter medications.   Should you have any questions regarding your medications and/or any new or worsening symptoms, please be sure to call the clinic at [phone].   If you believe that you are suffering from a life threatening condition or one that may result in the loss of limb or function, then you should call 911 and proceed to the nearest Emergency Department.   A healthy lifestyle and preventative care can promote health and wellness.   Maintain regular health, dental, and eye exams.  Eat a healthy diet. Foods like vegetables, fruits, whole grains, low-fat dairy products, and lean protein foods contain the nutrients you need without too many calories. Decrease your intake of foods high in solid fats, added sugars, and salt. Get information about a proper diet from your caregiver, if necessary.  Regular physical exercise is one of the most important things you can do for your health. Most adults should get at least 150 minutes of moderate-intensity exercise (any activity that increases your heart rate and causes you to sweat) each week. In addition, most adults need muscle-strengthening exercises on 2 or more days a week.   Maintain a healthy weight. The body mass index (BMI) is a screening tool to identify possible weight problems. It provides an estimate of body fat based on height and weight. Your caregiver can help determine your BMI, and can help you achieve or maintain a healthy weight. For adults 20 years and older:  A BMI below 18.5 is considered  underweight.  A BMI of 18.5 to 24.9 is normal.  A BMI of 25 to 29.9 is considered overweight.  A BMI of 30 and above is considered obese.

## 2016-04-17 NOTE — Progress Notes (Signed)
Patient ID: Jasmine Jackson, female   DOB: 1971-09-18, 45 y.o.   MRN: [id number]     Subjective:   Patient ID: Jasmine Jackson female    DOB: 04-26-1971 45 y.o.    MRN: [id number] Health Maintenance Due: Health Maintenance Due  Topic Date Due  . PAP SMEAR  1971-05-30    _________________________________________________  HPI: Jasmine Jackson is a 45 y.o. female here for an acute visit for vaginal discharge.  Pt has a PMH outlined below.  Please see problem-based charting assessment and plan for further status of patient's chronic medical problems addressed at today's visit.  PMH: Past Medical History  Diagnosis Date  . Heart murmur   . Anemia   . Anxiety and depression   . Hemorrhoids   . Asthma   . Bipolar 1 disorder ([place])   . GERD (gastroesophageal reflux disease)   . Internal hemorrhoids   . Anxiety   . Depression   . Hiatal hernia     Medications: Current Outpatient Prescriptions on File Prior to Visit  Medication Sig Dispense Refill  . albuterol (PROVENTIL HFA;VENTOLIN HFA) 108 (90 BASE) MCG/ACT inhaler Inhale 2 puffs into the lungs every 6 (six) hours as needed for wheezing or shortness of breath. 3 Inhaler 3  . busPIRone (BUSPAR) 15 MG tablet Take 30 mg by mouth 2 (two) times daily.    . colchicine (COLCRYS) 0.6 MG tablet Take 1 tablet (0.6 mg total) by mouth daily. (Patient taking differently: Take 0.6 mg by mouth daily as needed (gout.). ) 90 tablet 3  . diclofenac (VOLTAREN) 75 MG EC tablet Take 1 tablet (75 mg total) by mouth 2 (two) times daily. (Patient taking differently: Take 75 mg by mouth 2 (two) times daily as needed for mild pain. ) 180 tablet 1  . dicyclomine (BENTYL) 10 MG capsule Take 1 capsule (10 mg total) by mouth 4 (four) times daily -  before meals and at bedtime. 360 capsule 0  . fluconazole (DIFLUCAN) 150 MG tablet Take 1 tablet (150 mg total) by mouth once. (Patient not taking: Reported on 04/11/2016) 1 tablet 0  . FLUoxetine (PROZAC) 40 MG  capsule Take 80 mg by mouth every morning. Take two capsules every morning    . gabapentin (NEURONTIN) 800 MG tablet Take 800 mg by mouth 3 (three) times daily.    . hydrocortisone-pramoxine (ANALPRAM HC) 2.5-1 % rectal cream Place 1 application rectally 3 (three) times daily. (Patient not taking: Reported on 04/11/2016) 30 g 2  . hydrOXYzine (VISTARIL) 25 MG capsule Take 25 mg by mouth 3 (three) times daily as needed for itching.     . hyoscyamine (LEVSIN SL) 0.125 MG SL tablet Take 1 tablet (0.125 mg total) by mouth 2 (two) times daily as needed. (Patient taking differently: Take 0.125 mg by mouth 2 (two) times daily as needed for cramping. ) 60 tablet 0  . meclizine (ANTIVERT) 25 MG tablet Take 1 tablet (25 mg total) by mouth 2 (two) times daily. 30 tablet 0  . nicotine (NICODERM CQ - DOSED IN MG/24 HOURS) 21 mg/24hr patch Place 1 patch (21 mg total) onto the skin daily. (Patient not taking: Reported on 04/11/2016) 28 patch 0  . omeprazole (PRILOSEC) 40 MG capsule Take 1 capsule (40 mg total) by mouth 2 (two) times daily. 180 capsule 0  . ondansetron (ZOFRAN) 4 MG tablet Take 1 tablet (4 mg total) by mouth 2 (two) times daily as needed for nausea or vomiting. 60 tablet 2  .  ondansetron (ZOFRAN-ODT) 8 MG disintegrating tablet Take 1 tablet (8 mg total) by mouth once. 15 tablet 0  . QUEtiapine (SEROQUEL) 25 MG tablet Take 25 mg by mouth 3 (three) times daily as needed. For severe agitation during the day.    . SEROQUEL XR 400 MG 24 hr tablet Take 400 mg by mouth at bedtime.     Marland Kitchen tiZANidine (ZANAFLEX) 2 MG tablet Take 1 tablet (2 mg total) by mouth every 8 (eight) hours as needed for muscle spasms. 270 tablet 0  . traMADol (ULTRAM) 50 MG tablet Take 1 tablet (50 mg total) by mouth every 8 (eight) hours as needed. (Patient taking differently: Take 50 mg by mouth every 8 (eight) hours as needed for moderate pain. ) 30 tablet 0   No current facility-administered medications on file prior to visit.     Allergies: Allergies  Allergen Reactions  . Morphine And Related Itching    Itching w/o rash    FH: Family History  Problem Relation Age of Onset  . Breast cancer Maternal Aunt     x4  . Stomach cancer Maternal Aunt   . Prostate cancer Maternal Uncle     x2  . Breast cancer Maternal Grandmother   . Celiac disease Mother   . Colon cancer Neg Hx   . Esophageal cancer Neg Hx   . Rectal cancer Neg Hx     SH: Social History   Social History  . Marital Status: Single    Spouse Name: N/A  . Number of Children: 1  . Years of Education: N/A   Occupational History  . Disability    Social History Main Topics  . Smoking status: Former Smoker -- 0.50 packs/day    Types: Cigarettes  . Smokeless tobacco: Never Used     Comment: 1 pack every 3 days  . Alcohol Use: 0.0 oz/week    0 Standard drinks or equivalent per week     Comment: rare occasions  . Drug Use: No  . Sexual Activity: Not Asked   Other Topics Concern  . None   Social History Narrative    Review of Systems: Constitutional: Negative for fever, chills.  Eyes: Negative for blurred vision.  Respiratory: Negative for cough and shortness of breath.  Cardiovascular: Negative for chest pain.  Gastrointestinal: Negative for nausea, vomiting. Neurological: Negative for dizziness.   Objective:   Vital Signs: Filed Vitals:   04/17/16 1030  BP: 143/89  Pulse: 70  Temp: 98.3 F (36.8 C)  TempSrc: Oral  Height: 5\' 6"  (1.676 m)  Weight: 203 lb (92.08 kg)  SpO2: 100%      BP Readings from Last 3 Encounters:  04/17/16 143/89  04/11/16 162/79  01/11/16 139/77    Physical Exam: Constitutional: Vital signs reviewed.  Patient is in NAD and cooperative with exam.  Head: Normocephalic and atraumatic. Eyes: EOMI, conjunctivae nl, no scleral icterus.  Neck: Supple. Cardiovascular: RRR, no MRG. Pulmonary/Chest: normal effort, CTAB, no wheezes, rales, or rhonchi. Abdominal: Soft. NT/ND  +BS. Neurological: A&O x3, cranial nerves II-XII are grossly intact, moving all extremities. Extremities: No LE edema. Skin: Warm, dry and intact. No rash.   Assessment & Plan:   Assessment and plan was discussed and formulated with my attending.

## 2016-04-18 LAB — URINE CYTOLOGY ANCILLARY ONLY
Chlamydia: NEGATIVE
Neisseria Gonorrhea: NEGATIVE
TRICH (WINDOWPATH): NEGATIVE

## 2016-04-22 ENCOUNTER — Other Ambulatory Visit: Payer: Self-pay | Admitting: Internal Medicine

## 2016-04-22 DIAGNOSIS — N760 Acute vaginitis: Principal | ICD-10-CM

## 2016-04-22 DIAGNOSIS — B9689 Other specified bacterial agents as the cause of diseases classified elsewhere: Secondary | ICD-10-CM

## 2016-04-22 LAB — URINE CYTOLOGY ANCILLARY ONLY: CANDIDA VAGINITIS: NEGATIVE

## 2016-04-22 MED ORDER — METRONIDAZOLE 500 MG PO TABS: 500.0000 mg | ORAL_TABLET | Freq: Two times a day (BID) | ORAL | Status: DC

## 2016-04-22 NOTE — Progress Notes (Signed)
Case discussed with Dr. Gill soon after the resident saw the patient.  We reviewed the resident's history and exam and pertinent patient test results.  I agree with the assessment, diagnosis, and plan of care documented in the resident's note. 

## 2016-05-17 ENCOUNTER — Ambulatory Visit: Payer: Medicare Other | Admitting: Internal Medicine

## 2016-05-17 ENCOUNTER — Encounter: Payer: Self-pay | Admitting: Internal Medicine

## 2016-06-03 ENCOUNTER — Other Ambulatory Visit: Payer: Self-pay | Admitting: Internal Medicine

## 2016-06-04 ENCOUNTER — Other Ambulatory Visit: Payer: Self-pay | Admitting: Internal Medicine

## 2016-06-04 NOTE — Telephone Encounter (Signed)
Called in, sent to [facility]

## 2016-06-26 DIAGNOSIS — F3181 Bipolar II disorder: Secondary | ICD-10-CM | POA: Diagnosis not present

## 2016-07-04 ENCOUNTER — Encounter: Payer: Self-pay | Admitting: Internal Medicine

## 2016-07-04 ENCOUNTER — Ambulatory Visit (INDEPENDENT_AMBULATORY_CARE_PROVIDER_SITE_OTHER): Payer: Medicare Other | Admitting: Internal Medicine

## 2016-07-04 ENCOUNTER — Telehealth: Payer: Self-pay

## 2016-07-04 ENCOUNTER — Other Ambulatory Visit: Payer: Self-pay | Admitting: Internal Medicine

## 2016-07-04 VITALS — BP 147/81 | HR 77 | Temp 98.1°F | Ht 66.0 in | Wt 196.8 lb

## 2016-07-04 DIAGNOSIS — K582 Mixed irritable bowel syndrome: Secondary | ICD-10-CM

## 2016-07-04 DIAGNOSIS — N760 Acute vaginitis: Secondary | ICD-10-CM

## 2016-07-04 DIAGNOSIS — F319 Bipolar disorder, unspecified: Secondary | ICD-10-CM

## 2016-07-04 DIAGNOSIS — I10 Essential (primary) hypertension: Secondary | ICD-10-CM | POA: Diagnosis not present

## 2016-07-04 DIAGNOSIS — M545 Low back pain, unspecified: Secondary | ICD-10-CM

## 2016-07-04 DIAGNOSIS — R030 Elevated blood-pressure reading, without diagnosis of hypertension: Secondary | ICD-10-CM

## 2016-07-04 DIAGNOSIS — B9689 Other specified bacterial agents as the cause of diseases classified elsewhere: Secondary | ICD-10-CM

## 2016-07-04 DIAGNOSIS — K589 Irritable bowel syndrome without diarrhea: Secondary | ICD-10-CM | POA: Diagnosis not present

## 2016-07-04 DIAGNOSIS — Z0000 Encounter for general adult medical examination without abnormal findings: Secondary | ICD-10-CM

## 2016-07-04 MED ORDER — ONDANSETRON 8 MG PO TBDP: 8.0000 mg | ORAL_TABLET | Freq: Three times a day (TID) | ORAL | Status: DC | PRN

## 2016-07-04 MED ORDER — TRAMADOL HCL 50 MG PO TABS: 50.0000 mg | ORAL_TABLET | Freq: Three times a day (TID) | ORAL | Status: DC | PRN

## 2016-07-04 MED ORDER — ONDANSETRON 8 MG PO TBDP: 8.0000 mg | ORAL_TABLET | Freq: Once | ORAL | Status: DC

## 2016-07-04 MED ORDER — HYDROCHLOROTHIAZIDE 12.5 MG PO CAPS: 12.5000 mg | ORAL_CAPSULE | Freq: Every day | ORAL | Status: DC

## 2016-07-04 MED ORDER — ALBUTEROL SULFATE HFA 108 (90 BASE) MCG/ACT IN AERS: 2.0000 | INHALATION_SPRAY | Freq: Four times a day (QID) | RESPIRATORY_TRACT | Status: DC | PRN

## 2016-07-04 NOTE — Assessment & Plan Note (Addendum)
A: She is managed by [facility] for this and they provide her medications which are Seroquel, Buspar, Prozac, and Vistaril.  She also reports that Gabapentin is prescribed by [facility].  P: - continue current management and follow up with [facility]. - closely monitor while on Seroquel for metabolic syndrome

## 2016-07-04 NOTE — Assessment & Plan Note (Signed)
A: She reports resolution of symptoms after completing course of metronidazole.  P: - remove metronidazole from medication list - resolve problem from problem list

## 2016-07-04 NOTE — Telephone Encounter (Signed)
Jasmine Jackson from [facility] requesting the nurse to call back regarding Zofran.

## 2016-07-04 NOTE — Assessment & Plan Note (Signed)
A: Patient having some worsening low back pain recently that has not been as responsive to her tizanidine as in the past.  She describes pain as localized to her lumbar spine with no radiating symptoms to suggest radiculopathy.  She cannot think of any aggravating factors.  It is partially relieved with heating pad and when she is able to do Zumba.  She has never tried other therapies such as stretching, yoga, or Tai Chi.  Currently, she takes tizanidine 37m Q8H as needed, diclofenac 732mBID as needed, and occasional Tramadol.  She had a normal lumbar spine x-ray in March 2015.  P: - I encouraged continued exercise as tolerated and we discussed trying other modalities such as yoga and Tai Chi to which she was very receptive - I discussed that weight loss would help with low back pain. - continue with current medication management

## 2016-07-04 NOTE — Progress Notes (Signed)
Patient ID: Jasmine Jackson, female   DOB: [birth year], 45 y.o.   MRN: [id number]   CC: follow up for elevated BP and having low back pain  HPI:  Jasmine Jackson is a 45 y.o. woman with PMHx as detailed below.  She is presenting to [facility] today for routine follow up of elevated BP readings and low back pain.   Please see A&P for more details of today's visit.  Past Medical History  Diagnosis Date  . Heart murmur   . Anemia   . Anxiety and depression   . Hemorrhoids   . Asthma   . Bipolar 1 disorder ([place])   . GERD (gastroesophageal reflux disease)   . Internal hemorrhoids   . Anxiety   . Depression   . Hiatal hernia     Review of Systems:   Review of Systems  Constitutional: Negative for fever and chills.  Respiratory: Negative for shortness of breath.   Cardiovascular: Negative for chest pain and leg swelling.  Musculoskeletal: Positive for back pain.     Physical Exam:  Filed Vitals:   07/04/16 1400  BP: 147/81  Pulse: 77  Temp: 98.1 F (36.7 C)  TempSrc: Oral  Height: 5' 6"  (1.676 m)  Weight: 196 lb 12.8 oz (89.268 kg)  SpO2: 100%   Physical Exam  Constitutional: She is oriented to person, place, and time. She appears well-developed and well-nourished. No distress.  HENT:  Head: Normocephalic and atraumatic.  Eyes: EOM are normal.  Neck: Normal range of motion.  Cardiovascular: Normal rate and regular rhythm.   Pulmonary/Chest: Effort normal and breath sounds normal.  Neurological: She is alert and oriented to person, place, and time.  Skin: Skin is warm and dry.  Psychiatric: She has a normal mood and affect. Her behavior is normal.     Assessment & Plan:   See encounters tab for problem based medical decision making.   Patient discussed with Dr. Evette Doffing   Irritable bowel syndrome A: She reports that her symptoms are stable.  She currently takes Bentyl regularly.  She has not taken her Levsin that is listed as PRN in a while and states it gives  her headaches whenever she does take it.  P: - continue Bentyl - will have her stop taking Levsin as she has not been needing it and side effect of headaches  BV (bacterial vaginosis) A: She reports resolution of symptoms after completing course of metronidazole.  P: - remove metronidazole from medication list - resolve problem from problem list  Bipolar disorder, unspecified A: She is managed by [facility] for this and they provide her medications which are Seroquel, Buspar, Prozac, and Vistaril.  She also reports that Gabapentin is prescribed by [facility].  P: - continue current management and follow up with [facility]. - closely monitor while on Seroquel for metabolic syndrome  Hypertension BP Readings from Last 3 Encounters:  07/04/16 147/81  04/17/16 143/89  04/11/16 162/79   A: BP elevated again today.  She tells me that she has never been on blood pressure medications.  She reports a family history of hypertension.  P:  - start HCTZ 12.5 mg daily - will have her RTC in 4 weeks to recheck her BP and BMET - I recommended she check her BP 3-4 times per week and record the values in a journal to bring to follow up - I provided her with information on DASH diet for lowering sodium intake - I encouraged diet and exercise  Low back pain A: Patient having some worsening low back pain recently that has not been as responsive to her tizanidine as in the past.  She describes pain as localized to her lumbar spine with no radiating symptoms to suggest radiculopathy.  She cannot think of any aggravating factors.  It is partially relieved with heating pad and when she is able to do Zumba.  She has never tried other therapies such as stretching, yoga, or Tai Chi.  Currently, she takes tizanidine 65m Q8H as needed, diclofenac 762mBID as needed, and occasional Tramadol.  She had a normal lumbar spine x-ray in March 2015.  P: - I encouraged continued exercise as tolerated and we discussed  trying other modalities such as yoga and Tai Chi to which she was very receptive - I discussed that weight loss would help with low back pain. - continue with current medication management  Health care maintenance A: Patient with hysterectomy in 2002 with prior notes from old PCP mentioning total hysterectomy due to fibroids.  Last Pap smear was attempted in January 2017 with inability to locate cervical os.  Prior to that, her last Pap was in 2002 and patient thought she did not require further Pap smears.  [facility] and thought she did not need further Pap smears.  P: - she will follow up with me in 1 month for BP.  I will ask her to sign release to obtain records in order for [place] determine her need, or lack thereof, for further Pap smears.

## 2016-07-04 NOTE — Assessment & Plan Note (Signed)
BP Readings from Last 3 Encounters:  07/04/16 147/81  04/17/16 143/89  04/11/16 162/79   A: BP elevated again today.  She tells me that she has never been on blood pressure medications.  She reports a family history of hypertension.  P:  - start HCTZ 12.5 mg daily - will have her RTC in 4 weeks to recheck her BP and BMET - I recommended she check her BP 3-4 times per week and record the values in a journal to bring to follow up - I provided her with information on DASH diet for lowering sodium intake - I encouraged diet and exercise

## 2016-07-04 NOTE — Telephone Encounter (Signed)
Pharmacy requesting clarification of Zofran instructions. Currently state to take once. Thanks!

## 2016-07-04 NOTE — Assessment & Plan Note (Signed)
A: Patient with hysterectomy in 2002 with prior notes from old PCP mentioning total hysterectomy due to fibroids.  Last Pap smear was attempted in January 2017 with inability to locate cervical os.  Prior to that, her last Pap was in 2002 and patient thought she did not require further Pap smears.  She saw Ob/Gyn in November 2015 [facility]) and thought she did not need further Pap smears.  P: - she will follow up with me in 1 month for BP.  I will ask her to sign release to obtain records in order for [place] to determine her need, or lack thereof, for further Pap smears.

## 2016-07-04 NOTE — Patient Instructions (Addendum)
Thank you for coming to see me today. It was a pleasure. Today we talked about:   Blood Pressure:  We are starting a new medication today called HCTZ at a low dose.  You will take this medication daily.  Please try to monitor your BP at home or at the drug store 3-4 times per week.  You will follow up with me in 2 weeks to see how you are doing.  I have also given you some information on low salt (DASH) diet.  Please let us know if you start to have any symptoms of lightheadeness or dizziness.  Back Pain: I would like you to try Tai Chi and Yoga to see if this helps.  We will not make any changes to your other medications for this.  Please follow-up with me in 3-4 weeks  If you have any questions or concerns, please do not hesitate to call the office at [phone].  Take Care,   Jule Ser, DO DASH Eating Plan DASH stands for "Dietary Approaches to Stop Hypertension." The DASH eating plan is a healthy eating plan that has been shown to reduce high blood pressure (hypertension). Additional health benefits may include reducing the risk of type 2 diabetes mellitus, heart disease, and stroke. The DASH eating plan may also help with weight loss. WHAT DO I NEED TO KNOW ABOUT THE DASH EATING PLAN? For the DASH eating plan, you will follow these general guidelines:  Choose foods with a percent daily value for sodium of less than 5% (as listed on the food label).  Use salt-free seasonings or herbs instead of table salt or sea salt.  Check with your health care provider or pharmacist before using salt substitutes.  Eat lower-sodium products, often labeled as "lower sodium" or "no salt added."  Eat fresh foods.  Eat more vegetables, fruits, and low-fat dairy products.  Choose whole grains. Look for the word "whole" as the first word in the ingredient list.  Choose fish and skinless chicken or [place] more often than red meat. Limit fish, poultry, and meat to 6 oz (170 g) each  day.  Limit sweets, desserts, sugars, and sugary drinks.  Choose heart-healthy fats.  Limit cheese to 1 oz (28 g) per day.  Eat more home-cooked food and less restaurant, buffet, and fast food.  Limit fried foods.  Cook foods using methods other than frying.  Limit canned vegetables. If you do use them, rinse them well to decrease the sodium.  When eating at a restaurant, ask that your food be prepared with less salt, or no salt if possible. WHAT FOODS CAN I EAT? Seek help from a dietitian for individual calorie needs. Grains Whole grain or whole wheat bread. Brown rice. Whole grain or whole wheat pasta. Quinoa, bulgur, and whole grain cereals. Low-sodium cereals. Corn or whole wheat flour tortillas. Whole grain cornbread. Whole grain crackers. Low-sodium crackers. Vegetables Fresh or frozen vegetables (raw, steamed, roasted, or grilled). Low-sodium or reduced-sodium tomato and vegetable juices. Low-sodium or reduced-sodium tomato sauce and paste. Low-sodium or reduced-sodium canned vegetables.  Fruits All fresh, canned (in natural juice), or frozen fruits. Meat and Other Protein Products Ground beef (85% or leaner), grass-fed beef, or beef trimmed of fat. Skinless chicken or [place]. Ground chicken or [place]. Pork trimmed of fat. All fish and seafood. Eggs. Dried beans, peas, or lentils. Unsalted nuts and seeds. Unsalted canned beans. Dairy Low-fat dairy products, such as skim or 1% milk, 2% or reduced-fat cheeses, low-fat ricotta or  cottage cheese, or plain low-fat yogurt. Low-sodium or reduced-sodium cheeses. Fats and Oils Tub margarines without trans fats. Light or reduced-fat mayonnaise and salad dressings (reduced sodium). Avocado. Safflower, olive, or canola oils. Natural peanut or almond butter. Other Unsalted popcorn and pretzels. The items listed above may not be a complete list of recommended foods or beverages. Contact your dietitian for more options. WHAT FOODS ARE NOT  RECOMMENDED? Grains White bread. White pasta. White rice. Refined cornbread. Bagels and croissants. Crackers that contain trans fat. Vegetables Creamed or fried vegetables. Vegetables in a cheese sauce. Regular canned vegetables. Regular canned tomato sauce and paste. Regular tomato and vegetable juices. Fruits Dried fruits. Canned fruit in light or heavy syrup. Fruit juice. Meat and Other Protein Products Fatty cuts of meat. Ribs, chicken wings, bacon, sausage, bologna, salami, chitterlings, fatback, hot dogs, bratwurst, and packaged luncheon meats. Salted nuts and seeds. Canned beans with salt. Dairy Whole or 2% milk, cream, half-and-half, and cream cheese. Whole-fat or sweetened yogurt. Full-fat cheeses or blue cheese. Nondairy creamers and whipped toppings. Processed cheese, cheese spreads, or cheese curds. Condiments Onion and garlic salt, seasoned salt, table salt, and sea salt. Canned and packaged gravies. Worcestershire sauce. Tartar sauce. Barbecue sauce. Teriyaki sauce. Soy sauce, including reduced sodium. Steak sauce. Fish sauce. Oyster sauce. Cocktail sauce. Horseradish. Ketchup and mustard. Meat flavorings and tenderizers. Bouillon cubes. Hot sauce. Tabasco sauce. Marinades. Taco seasonings. Relishes. Fats and Oils Butter, stick margarine, lard, shortening, ghee, and bacon fat. Coconut, palm kernel, or palm oils. Regular salad dressings. Other Pickles and olives. Salted popcorn and pretzels. The items listed above may not be a complete list of foods and beverages to avoid. Contact your dietitian for more information. WHERE CAN I FIND MORE INFORMATION? National Heart, Lung, and Blood Institute: travelstabloid.com   This information is not intended to replace advice given to you by your health care provider. Make sure you discuss any questions you have with your health care provider.   Document Released: 11/28/2011 Document Revised: 12/30/2014  Document Reviewed: 10/13/2013 Elsevier Interactive Patient Education [employer].

## 2016-07-04 NOTE — Telephone Encounter (Signed)
Updated Rx to say take every 8 hours as needed and resent to pharmacy.  Thanks!

## 2016-07-04 NOTE — Assessment & Plan Note (Signed)
A: She reports that her symptoms are stable.  She currently takes Bentyl regularly.  She has not taken her Levsin that is listed as PRN in a while and states it gives her headaches whenever she does take it.  P: - continue Bentyl - will have her stop taking Levsin as she has not been needing it and side effect of headaches

## 2016-07-09 ENCOUNTER — Other Ambulatory Visit: Payer: Self-pay | Admitting: Internal Medicine

## 2016-07-09 NOTE — Progress Notes (Signed)
Internal Medicine Clinic Attending  Case discussed with Dr. Wallace at the time of the visit.  We reviewed the resident's history and exam and pertinent patient test results.  I agree with the assessment, diagnosis, and plan of care documented in the resident's note.  

## 2016-07-16 ENCOUNTER — Other Ambulatory Visit: Payer: Self-pay

## 2016-07-16 NOTE — Telephone Encounter (Signed)
Pt states omeprazole is not at the pharmacy.

## 2016-07-17 NOTE — Telephone Encounter (Signed)
Called pharm, the pharm states pt just picked up a 90 day supply of med on 06/07/2016, attempted to call pt lm for rtc

## 2016-07-19 MED ORDER — OMEPRAZOLE 40 MG PO CPDR: 40.0000 mg | DELAYED_RELEASE_CAPSULE | Freq: Every day | ORAL | 1 refills | Status: DC

## 2016-07-19 NOTE — Telephone Encounter (Signed)
Spoke with patient who was not aware that she picked up a 90-day supply. Patient has been taking 2 omeprazole every morning. She is now out and wanted to know if we could refill and if she needed to do anything at this point since she has been taking 2 per day. Thanks!

## 2016-07-19 NOTE — Telephone Encounter (Signed)
LVM to return call.

## 2016-08-01 ENCOUNTER — Ambulatory Visit (INDEPENDENT_AMBULATORY_CARE_PROVIDER_SITE_OTHER): Payer: Medicare Other | Admitting: Internal Medicine

## 2016-08-01 ENCOUNTER — Encounter: Payer: Self-pay | Admitting: Internal Medicine

## 2016-08-01 ENCOUNTER — Other Ambulatory Visit: Payer: Self-pay | Admitting: Internal Medicine

## 2016-08-01 VITALS — BP 140/92 | HR 88 | Temp 97.9°F | Ht 66.0 in | Wt 194.1 lb

## 2016-08-01 DIAGNOSIS — Z90710 Acquired absence of both cervix and uterus: Secondary | ICD-10-CM | POA: Diagnosis not present

## 2016-08-01 DIAGNOSIS — Z0000 Encounter for general adult medical examination without abnormal findings: Secondary | ICD-10-CM

## 2016-08-01 DIAGNOSIS — I10 Essential (primary) hypertension: Secondary | ICD-10-CM | POA: Diagnosis not present

## 2016-08-01 DIAGNOSIS — R030 Elevated blood-pressure reading, without diagnosis of hypertension: Secondary | ICD-10-CM

## 2016-08-01 DIAGNOSIS — K648 Other hemorrhoids: Secondary | ICD-10-CM

## 2016-08-01 DIAGNOSIS — Z87891 Personal history of nicotine dependence: Secondary | ICD-10-CM | POA: Diagnosis not present

## 2016-08-01 MED ORDER — HYDROCHLOROTHIAZIDE 12.5 MG PO CAPS: 25.0000 mg | ORAL_CAPSULE | Freq: Every day | ORAL | 0 refills | Status: DC

## 2016-08-01 NOTE — Patient Instructions (Signed)
Thank you for coming to see me today. It was a pleasure. Today we talked about:   Blood Pressure: - we have increased your HCTZ to 25mg  daily.  Take 2 tablets - continue keeping a BP log at home - continue exercising and watching your salt intake  Hemorrhoids and Stools: - try Metamucil first to help with forming your stools more and to help with the bleeding.  You can purchase this over the counter.  Please follow-up with me in 4 weeks.  If you have any questions or concerns, please do not hesitate to call the office at [phone].  Take Care,   Jule Ser, DO

## 2016-08-01 NOTE — Progress Notes (Signed)
CC: here for follow up of HTN  HPI:  [name] is a 45 y.o. woman with a past medical history listed below here today for follow up of her BP.  For details of today's visit and the status of her chronic medical issues please refer to the assessment and plan.   Past Medical History:  Diagnosis Date  . Anemia   . Anxiety   . Anxiety and depression   . Asthma   . Bipolar 1 disorder ([place])   . Depression   . GERD (gastroesophageal reflux disease)   . Heart murmur   . Hemorrhoids   . Hiatal hernia   . Internal hemorrhoids     Review of Systems:   Review of Systems  Constitutional: Negative for chills and fever.  Respiratory: Negative for shortness of breath.   Cardiovascular: Negative for chest pain.  Gastrointestinal: Positive for blood in stool (consistent w/ her hemorrhoids).  Neurological: Negative for dizziness.     Physical Exam:  Vitals:   08/01/16 1403  BP: (!) 140/92  Pulse: 88  Temp: 97.9 F (36.6 C)  TempSrc: Oral  SpO2: 100%  Weight: 194 lb 1.6 oz (88 kg)  Height: 5\' 6"  (1.676 m)   Physical Exam  Constitutional: She is oriented to person, place, and time and well-developed, well-nourished, and in no distress.  HENT:  Head: Normocephalic and atraumatic.  Pulmonary/Chest: Effort normal and breath sounds normal.  Neurological: She is alert and oriented to person, place, and time.  Skin: Skin is warm and dry.  Psychiatric: Mood and affect normal.    Assessment & Plan:   See Encounters Tab for problem based charting.  Patient discussed with Dr. Daryll Drown  Internal hemorrhoids without complication A: Reports recently less formed stools accompanied by some blood noticed on the toilet paper with wiping.  Reports that she had her hemorrhoids banded years ago but they seem to have returned to some degree.  She denies any pain, pruritis, or further bleeding.  Had used Analpram in the past but says it is not covered by her insurance at this  point.  P: - as her only complaint is some mild bleeding and less formed stool, recommended a bulk-forming type laxative such as Metamucil - continue to monitor.  Consider referring to GI if symptoms worsen or persist.  Hypertension BP Readings from Last 3 Encounters:  08/01/16 (!) 140/92  07/04/16 (!) 147/81  04/17/16 (!) 143/89   A:  Her BP today is not bad on HCTZ 12.5mg  daily that was started about 1 month ago.  She brings with her a daily BP log that shows most BP's elevated with only a few with systolic BP less than [id number] or 130.  She has been consistently taking her medications at took medications today.  Body mass index is 31.33 kg/m.  P: - increase HCTZ to 25mg  daily - BMET today stable - will have her RTC in 4 weeks to recheck her BP - I recommended she check her BP 3-4 times per week and record the values in a journal to bring to follow up - I provided her with information on DASH diet for lowering sodium intake - I encouraged diet and exercise  Health care maintenance A: Patient with hysterectomy in 2002 with prior notes from old PCP mentioning total hysterectomy due to fibroids.  Last Pap smear was attempted in January 2017 with inability to locate cervical os.  Prior to that, her last Pap was in 2002 and  patient thought she did not require further Pap smears.  She saw Ob/Gyn in November 2015 [facility]) and thought she did not need further Pap smears.  P: - records request completed today to help determine further need, or lack thereof, for further Pap smears.

## 2016-08-02 LAB — BMP8+ANION GAP
Anion Gap: 21 mmol/L — ABNORMAL HIGH (ref 10.0–18.0)
BUN/Creatinine Ratio: 15 (ref 9–23)
BUN: 11 mg/dL (ref 6–24)
CO2: 24 mmol/L (ref 18–29)
CREATININE: 0.75 mg/dL (ref 0.57–1.00)
Calcium: 10.2 mg/dL (ref 8.7–10.2)
Chloride: 95 mmol/L — ABNORMAL LOW (ref 96–106)
GFR, EST AFRICAN AMERICAN: 111 mL/min/{1.73_m2} (ref >59)
GFR, EST NON AFRICAN AMERICAN: 96 mL/min/{1.73_m2} (ref >59)
Glucose: 64 mg/dL — ABNORMAL LOW (ref 65–99)
POTASSIUM: 4.4 mmol/L (ref 3.5–5.2)
SODIUM: 140 mmol/L (ref 134–144)

## 2016-08-03 NOTE — Assessment & Plan Note (Signed)
BP Readings from Last 3 Encounters:  08/01/16 (!) 140/92  07/04/16 (!) 147/81  04/17/16 (!) 143/89   A:  Her BP today is not bad on HCTZ 12.5mg  daily that was started about 1 month ago.  She brings with her a daily BP log that shows most BP's elevated with only a few with systolic BP less than [id number] or 130.  She has been consistently taking her medications at took medications today.  Body mass index is 31.33 kg/m.  P: - increase HCTZ to 25mg  daily - BMET today stable - will have her RTC in 4 weeks to recheck her BP - I recommended she check her BP 3-4 times per week and record the values in a journal to bring to follow up - I provided her with information on DASH diet for lowering sodium intake - I encouraged diet and exercise

## 2016-08-03 NOTE — Assessment & Plan Note (Signed)
A: Reports recently less formed stools accompanied by some blood noticed on the toilet paper with wiping.  Reports that she had her hemorrhoids banded years ago but they seem to have returned to some degree.  She denies any pain, pruritis, or further bleeding.  Had used Analpram in the past but says it is not covered by her insurance at this point.  P: - as her only complaint is some mild bleeding and less formed stool, recommended a bulk-forming type laxative such as Metamucil - continue to monitor.  Consider referring to GI if symptoms worsen or persist.

## 2016-08-03 NOTE — Assessment & Plan Note (Signed)
A: Patient with hysterectomy in 2002 with prior notes from old PCP mentioning total hysterectomy due to fibroids.  Last Pap smear was attempted in January 2017 with inability to locate cervical os.  Prior to that, her last Pap was in 2002 and patient thought she did not require further Pap smears.  She saw Ob/Gyn in November 2015 [facility]) and thought she did not need further Pap smears.  P: - records request completed today to help determine further need, or lack thereof, for further Pap smears.

## 2016-08-08 ENCOUNTER — Other Ambulatory Visit: Payer: Self-pay

## 2016-08-08 MED ORDER — TRAMADOL HCL 50 MG PO TABS: 50.0000 mg | ORAL_TABLET | Freq: Three times a day (TID) | ORAL | 2 refills | Status: DC | PRN

## 2016-08-08 NOTE — Telephone Encounter (Signed)
Requesting tramadol to be filled @ [facility].

## 2016-08-09 NOTE — Telephone Encounter (Signed)
Called to pharmacy 

## 2016-08-12 NOTE — Progress Notes (Signed)
Internal Medicine Clinic Attending  Case discussed with Dr. Wallace at the time of the visit.  We reviewed the resident's history and exam and pertinent patient test results.  I agree with the assessment, diagnosis, and plan of care documented in the resident's note.  

## 2016-08-15 DIAGNOSIS — F3181 Bipolar II disorder: Secondary | ICD-10-CM | POA: Diagnosis not present

## 2016-08-20 ENCOUNTER — Encounter: Payer: Self-pay | Admitting: *Deleted

## 2016-08-29 ENCOUNTER — Other Ambulatory Visit: Payer: Self-pay | Admitting: Internal Medicine

## 2016-08-29 ENCOUNTER — Encounter: Payer: Medicare Other | Admitting: Internal Medicine

## 2016-08-29 DIAGNOSIS — R030 Elevated blood-pressure reading, without diagnosis of hypertension: Secondary | ICD-10-CM

## 2016-09-02 ENCOUNTER — Encounter: Payer: Self-pay | Admitting: *Deleted

## 2016-09-06 ENCOUNTER — Ambulatory Visit (INDEPENDENT_AMBULATORY_CARE_PROVIDER_SITE_OTHER): Payer: Medicare Other | Admitting: Internal Medicine

## 2016-09-06 ENCOUNTER — Encounter: Payer: Self-pay | Admitting: Internal Medicine

## 2016-09-06 VITALS — BP 124/81 | HR 81 | Temp 98.2°F | Ht 66.0 in | Wt 193.8 lb

## 2016-09-06 DIAGNOSIS — Z23 Encounter for immunization: Secondary | ICD-10-CM

## 2016-09-06 DIAGNOSIS — R55 Syncope and collapse: Secondary | ICD-10-CM

## 2016-09-06 DIAGNOSIS — Z87891 Personal history of nicotine dependence: Secondary | ICD-10-CM

## 2016-09-06 DIAGNOSIS — Z0000 Encounter for general adult medical examination without abnormal findings: Secondary | ICD-10-CM

## 2016-09-06 DIAGNOSIS — I10 Essential (primary) hypertension: Secondary | ICD-10-CM

## 2016-09-09 DIAGNOSIS — F410 Panic disorder [episodic paroxysmal anxiety] without agoraphobia: Secondary | ICD-10-CM | POA: Insufficient documentation

## 2016-09-09 NOTE — Assessment & Plan Note (Signed)
Flu shot given today

## 2016-09-09 NOTE — Assessment & Plan Note (Signed)
A: By history these episodes seem to be vasovagal syncope events associated with panic attacks. They have been ongoing since she discontinued taking chronic benzodiazepines as a part of her treatment, and some even during that time. She reports a 20 year history of this intermittently in total. It is possible these could be triggered by an occult arrhythmia but this is less likely. She has had multiple previous scans within the past few years so something like an adrenaloma is also unlikely. She does follow at [facility] for her bipolar disorder but did not bring these up at all since seeing a new provider in the past few months.  P: -Recommended her to attend a new appointment for psychiatry at [facility] in the next weeks for evaluation of these seemingly panic or anxiety related events -If no improvement or not felt to be psychogenic she needs a cardiac monitor to exclude a paroxysmal arrhythmia

## 2016-09-09 NOTE — Progress Notes (Signed)
   CC: Syncopal episodes  HPI:  [name] is a 45 y.o. woman with a medical history detailed below presenting today after losing consciousness on Monday 9/11. She describes this episode as starting with a bout of severe anxiety after several arguments with her son's father which then progressed to sweating, palpitations, and lightheadedness. She laid down and passed out for she is not sure how long possibly several seconds. Afterwards she felt initially a bit lightheaded but back to normal within about 15 minutes. She has not had another event since Monday. She reports that she has actually been suffering similar episodes off and on about monthly or more for the past year. Up until last year she was also taking Klonopin daily in addition to her fluoxetine and seroquel for severe anxiety.  See problem based assessment and plan below for additional details.  Past Medical History:  Diagnosis Date  . Anemia   . Anxiety   . Anxiety and depression   . Asthma   . Bipolar 1 disorder ([place])   . Depression   . GERD (gastroesophageal reflux disease)   . Heart murmur   . Hemorrhoids   . Hiatal hernia   . Internal hemorrhoids     Review of Systems:  Review of Systems  Eyes: Negative for blurred vision.  Respiratory: Negative for shortness of breath.   Cardiovascular: Negative for chest pain.  Gastrointestinal: Negative for nausea.  Musculoskeletal: Negative for falls.  Neurological: Positive for dizziness, loss of consciousness and headaches. Negative for tremors, speech change, focal weakness and seizures.  Psychiatric/Behavioral: Negative for depression and substance abuse. The patient is nervous/anxious and has insomnia.     Physical Exam:  Vitals:   09/06/16 1658 09/06/16 1704  BP: 130/76 124/81  Pulse: 83 81  Temp: 98.2 F (36.8 C)   TempSrc: Oral   SpO2: 100%   Weight: 193 lb 12.8 oz (87.9 kg)   Height: 5\' 6"  (1.676 m)    GENERAL- A very high strung, pleasant  lady HEENT- Atraumatic, oral mucosa appears moist, no carotid bruit CARDIAC- RRR, no murmurs RESP- CTAB, no wheezes or crackles. NEURO- Sensation intact globally, no tremors EXTREMITIES- pulse 2+, symmetric, no pedal edema. SKIN- Warm, dry, No rash or lesion. PSYCH- She is a bit anxious appearing, mood is very labile becoming tearful discussing life stressors and psychiatric history    Assessment & Plan:   See Encounters Tab for problem based charting.  Patient discussed with Dr. Dareen Piano

## 2016-09-09 NOTE — Assessment & Plan Note (Signed)
  BP Readings from Last 3 Encounters:  09/06/16 124/81  08/01/16 (!) 140/92  07/04/16 (!) 147/81    Lab Results  Component Value Date   NA 140 08/01/2016   K 4.4 08/01/2016   CREATININE 0.75 08/01/2016   A: Jasmine Jackson brought a meticulous home BP log consistently demonstrating a SBP 120-150 predominantly in the 120-130 range. Blood pressures at 150 were associated with having a headache at that time, which are fairly common for her. BP is good in clinic today. This does not seem to be related to her syncopal episodes.  P: Recommended her to continue HCTZ 25mg  Continue low salt diet as able

## 2016-09-12 ENCOUNTER — Other Ambulatory Visit: Payer: Self-pay | Admitting: Internal Medicine

## 2016-09-12 DIAGNOSIS — Z1231 Encounter for screening mammogram for malignant neoplasm of breast: Secondary | ICD-10-CM

## 2016-09-12 DIAGNOSIS — F3181 Bipolar II disorder: Secondary | ICD-10-CM | POA: Diagnosis not present

## 2016-09-12 NOTE — Progress Notes (Signed)
Internal Medicine Clinic Attending  Case discussed with Dr. Rice at the time of the visit.  We reviewed the resident's history and exam and pertinent patient test results.  I agree with the assessment, diagnosis, and plan of care documented in the resident's note.  

## 2016-09-17 ENCOUNTER — Emergency Department (HOSPITAL_COMMUNITY)
Admission: EM | Admit: 2016-09-17 | Discharge: 2016-09-17 | Disposition: A | Discharge status: Home / Self Care | Payer: Medicare Other | Attending: Emergency Medicine | Admitting: Emergency Medicine

## 2016-09-17 ENCOUNTER — Encounter (HOSPITAL_COMMUNITY): Payer: Self-pay

## 2016-09-17 ENCOUNTER — Emergency Department (HOSPITAL_COMMUNITY): Payer: Medicare Other

## 2016-09-17 DIAGNOSIS — I10 Essential (primary) hypertension: Secondary | ICD-10-CM | POA: Diagnosis not present

## 2016-09-17 DIAGNOSIS — M791 Myalgia, unspecified site: Secondary | ICD-10-CM

## 2016-09-17 DIAGNOSIS — R0789 Other chest pain: Secondary | ICD-10-CM | POA: Diagnosis not present

## 2016-09-17 DIAGNOSIS — Z87891 Personal history of nicotine dependence: Secondary | ICD-10-CM | POA: Insufficient documentation

## 2016-09-17 DIAGNOSIS — J45909 Unspecified asthma, uncomplicated: Secondary | ICD-10-CM | POA: Insufficient documentation

## 2016-09-17 DIAGNOSIS — Z79899 Other long term (current) drug therapy: Secondary | ICD-10-CM | POA: Insufficient documentation

## 2016-09-17 DIAGNOSIS — R079 Chest pain, unspecified: Secondary | ICD-10-CM | POA: Diagnosis not present

## 2016-09-17 LAB — BASIC METABOLIC PANEL
ANION GAP: 8 (ref 5–15)
BUN: 8 mg/dL (ref 6–20)
CHLORIDE: 104 mmol/L (ref 101–111)
CO2: 28 mmol/L (ref 22–32)
Calcium: 9.9 mg/dL (ref 8.9–10.3)
Creatinine, Ser: 0.76 mg/dL (ref 0.44–1.00)
GFR calc non Af Amer: >60 mL/min (ref >60)
GLUCOSE: 96 mg/dL (ref 65–99)
POTASSIUM: 3.6 mmol/L (ref 3.5–5.1)
Sodium: 140 mmol/L (ref 135–145)

## 2016-09-17 LAB — CBC
HEMATOCRIT: 42.3 % (ref 36.0–46.0)
HEMOGLOBIN: 13.7 g/dL (ref 12.0–15.0)
MCH: 28.3 pg (ref 26.0–34.0)
MCHC: 32.4 g/dL (ref 30.0–36.0)
MCV: 87.4 fL (ref 78.0–100.0)
Platelets: 312 10*3/uL (ref 150–400)
RBC: 4.84 MIL/uL (ref 3.87–5.11)
RDW: 14.2 % (ref 11.5–15.5)
WBC: 7.3 10*3/uL (ref 4.0–10.5)

## 2016-09-17 LAB — I-STAT TROPONIN, ED: TROPONIN I, POC: 0 ng/mL (ref 0.00–0.08)

## 2016-09-17 LAB — TROPONIN I: Troponin I: <0.03 ng/mL (ref <0.03)

## 2016-09-17 MED ORDER — KETOROLAC TROMETHAMINE 30 MG/ML IJ SOLN
20.0000 mg | Freq: Once | INTRAMUSCULAR | Status: AC
  Administered 2016-09-17: 20 mg via INTRAMUSCULAR
  Filled 2016-09-17: qty 1

## 2016-09-17 MED ORDER — IBUPROFEN 400 MG PO TABS: 400.0000 mg | ORAL_TABLET | Freq: Three times a day (TID) | ORAL | 0 refills | Status: DC

## 2016-09-17 MED ORDER — DIAZEPAM 5 MG PO TABS: 2.5000 mg | ORAL_TABLET | Freq: Two times a day (BID) | ORAL | 0 refills | Status: DC | PRN

## 2016-09-17 MED ORDER — CYCLOBENZAPRINE HCL 10 MG PO TABS
5.0000 mg | ORAL_TABLET | Freq: Once | ORAL | Status: AC
  Administered 2016-09-17: 5 mg via ORAL
  Filled 2016-09-17: qty 1

## 2016-09-17 MED ORDER — KETOROLAC TROMETHAMINE 60 MG/2ML IM SOLN
60.0000 mg | Freq: Once | INTRAMUSCULAR | Status: DC
  Filled 2016-09-17: qty 2

## 2016-09-17 MED ORDER — KETOROLAC TROMETHAMINE 30 MG/ML IJ SOLN: 10.0000 mg | Freq: Once | INTRAMUSCULAR | Status: DC

## 2016-09-17 NOTE — ED Notes (Signed)
Pt stable, ambulatory, states understanding of discharge instructions 

## 2016-09-17 NOTE — ED Provider Notes (Signed)
[place] DEPT Provider Note   CSN: [id number] Arrival date & time: 09/17/16  [id number]     History   Chief Complaint Chief Complaint  Patient presents with  . Chest Pain    HPI Jasmine Jackson is a 45 y.o. female.   Chest Pain   This is a new problem. The current episode started yesterday. The problem occurs constantly. The problem has not changed since onset.The pain is mild. The quality of the pain is described as brief. The pain does not radiate. Pertinent negatives include no abdominal pain, no back pain, no cough, no exertional chest pressure, no malaise/fatigue, no nausea and no near-syncope.    Past Medical History:  Diagnosis Date  . Anemia   . Anxiety   . Anxiety and depression   . Asthma   . Bipolar 1 disorder ([place])   . Depression   . GERD (gastroesophageal reflux disease)   . Heart murmur   . Hemorrhoids   . Hiatal hernia   . Internal hemorrhoids     Patient Active Problem List   Diagnosis Date Noted  . Syncope 09/09/2016  . Health care maintenance 01/11/2016  . Low back pain 12/02/2015  . Hypertension 04/24/2015  . Muscle spasm 04/24/2015  . Bipolar disorder, unspecified ([place]) 04/16/2014  . History of gout 04/01/2014  . Internal hemorrhoids without complication [id number]  . Irritable bowel syndrome 12/08/2013  . Esophageal reflux 12/08/2013    Past Surgical History:  Procedure Laterality Date  . ABDOMINAL HYSTERECTOMY     fibroids  . HEMORRHOID SURGERY    . INGUINAL HERNIA REPAIR    . TONSILLECTOMY    . TUBAL LIGATION      OB History    No data available       Home Medications    Prior to Admission medications   Medication Sig Start Date End Date Taking? Authorizing Provider  albuterol (PROVENTIL HFA;VENTOLIN HFA) 108 (90 Base) MCG/ACT inhaler Inhale 2 puffs into the lungs every 6 (six) hours as needed for wheezing or shortness of breath. 07/04/16   Jule Ser, DO  busPIRone (BUSPAR) 15 MG tablet Take 30 mg by mouth 2 (two)  times daily.    Historical Provider, MD  colchicine (COLCRYS) 0.6 MG tablet Take 1 tablet (0.6 mg total) by mouth daily. Patient taking differently: Take 0.6 mg by mouth daily as needed (gout.).  11/29/15   Hoyt Koch, MD  diazepam (VALIUM) 5 MG tablet Take 0.5 tablets (2.5 mg total) by mouth every 12 (twelve) hours as needed for muscle spasms. 09/17/16   Merrily Pew, MD  diclofenac (VOLTAREN) 75 MG EC tablet Take 1 tablet (75 mg total) by mouth 2 (two) times daily. Patient taking differently: Take 75 mg by mouth 2 (two) times daily as needed for mild pain.  11/29/15   Hoyt Koch, MD  dicyclomine (BENTYL) 10 MG capsule Take 1 capsule (10 mg total) by mouth 4 (four) times daily -  before meals and at bedtime. 03/05/16   Lucious Groves, DO  FLUoxetine (PROZAC) 40 MG capsule Take 80 mg by mouth every morning. Take two capsules every morning    Historical Provider, MD  gabapentin (NEURONTIN) 800 MG tablet Take 800 mg by mouth 3 (three) times daily. 04/01/16   Historical Provider, MD  hydrochlorothiazide (MICROZIDE) 12.5 MG capsule TAKE 2 CAPSULES BY MOUTH DAILY 08/30/16   Jule Ser, DO  hydrOXYzine (VISTARIL) 25 MG capsule Take 25 mg by mouth 3 (three) times daily as needed  for itching.  08/31/15   Historical Provider, MD  ibuprofen (ADVIL,MOTRIN) 400 MG tablet Take 1 tablet (400 mg total) by mouth 3 (three) times daily. 09/17/16   Merrily Pew, MD  nicotine (NICODERM CQ - DOSED IN MG/24 HOURS) 21 mg/24hr patch Place 1 patch (21 mg total) onto the skin daily. Patient not taking: Reported on 04/11/2016 12/04/15   Hoyt Koch, MD  omeprazole (PRILOSEC) 40 MG capsule Take 1 capsule (40 mg total) by mouth daily. 07/19/16   Jule Ser, DO  ondansetron (ZOFRAN) 4 MG tablet Take 1 tablet (4 mg total) by mouth 2 (two) times daily as needed for nausea or vomiting. 01/11/16   Lucious Groves, DO  ondansetron (ZOFRAN-ODT) 8 MG disintegrating tablet Take 1 tablet (8 mg total) by mouth every  8 (eight) hours as needed for nausea or vomiting. 07/04/16   Jule Ser, DO  QUEtiapine (SEROQUEL) 25 MG tablet Take 25 mg by mouth 3 (three) times daily as needed. For severe agitation during the day. 04/01/16   Historical Provider, MD  SEROQUEL XR 400 MG 24 hr tablet Take 400 mg by mouth at bedtime.  08/31/15   Historical Provider, MD  tiZANidine (ZANAFLEX) 2 MG tablet Take 1 tablet (2 mg total) by mouth every 8 (eight) hours as needed for muscle spasms. 03/05/16   Lucious Groves, DO  traMADol (ULTRAM) 50 MG tablet Take 1 tablet (50 mg total) by mouth every 8 (eight) hours as needed. 08/08/16   Jule Ser, DO    Family History Family History  Problem Relation Age of Onset  . Breast cancer Maternal Aunt     x4  . Stomach cancer Maternal Aunt   . Prostate cancer Maternal Uncle     x2  . Breast cancer Maternal Grandmother   . Celiac disease Mother   . Colon cancer Neg Hx   . Esophageal cancer Neg Hx   . Rectal cancer Neg Hx     Social History Social History  Substance Use Topics  . Smoking status: Former Smoker    Packs/day: 0.50    Types: Cigarettes  . Smokeless tobacco: Never Used     Comment: 1 pack every 3 days  . Alcohol use No     Comment: rare occasions     Allergies   Morphine and related   Review of Systems Review of Systems  Constitutional: Negative for malaise/fatigue.  Respiratory: Negative for cough.   Cardiovascular: Positive for chest pain. Negative for near-syncope.  Gastrointestinal: Negative for abdominal pain and nausea.  Musculoskeletal: Negative for back pain.  All other systems reviewed and are negative.    Physical Exam Updated Vital Signs BP 112/75   Pulse (!) 59   Temp 98.6 F (37 C) (Oral)   Resp 18   Ht 5\' 6"  (1.676 m)   Wt 198 lb (89.8 kg)   SpO2 100%   BMI 31.96 kg/m   Physical Exam  Constitutional: She is oriented to person, place, and time. She appears well-developed and well-nourished. No distress.  HENT:  Head:  Normocephalic and atraumatic.  Eyes: Conjunctivae are normal.  Neck: Neck supple.  Cardiovascular: Normal rate and regular rhythm.   No murmur heard. Pulmonary/Chest: Effort normal and breath sounds normal. No respiratory distress.  Abdominal: Soft. She exhibits no distension. There is no tenderness.  Musculoskeletal: She exhibits no edema or deformity.  Neurological: She is alert and oriented to person, place, and time.  Skin: Skin is warm and dry.  Psychiatric:  She has a normal mood and affect.  Nursing note and vitals reviewed.    ED Treatments / Results  Labs (all labs ordered are listed, but only abnormal results are displayed) Labs Reviewed  BASIC METABOLIC PANEL  CBC  TROPONIN I  I-STAT TROPOININ, ED    EKG  EKG Interpretation  Date/Time:  Tuesday September 17 2016 12:46:16 EDT Ventricular Rate:  57 PR Interval:  142 QRS Duration: 106 QT Interval:  447 QTC Calculation: 436 R Axis:   57 Text Interpretation:  Sinus rhythm RSR' in V1 or V2, right VCD or RVH Confirmed by COOK  MD, BRIAN ([id number]) on 09/18/2016 3:46:29 PM       Radiology Dg Chest 2 View  Result Date: 09/17/2016 CLINICAL DATA:  45 year old with right-sided chest pain radiating to the back that began yesterday. Current history of asthma and hypertension. EXAM: CHEST  2 VIEW COMPARISON:  10/08/2012, 08/29/2011 and earlier. FINDINGS: Cardiomediastinal silhouette unremarkable, unchanged. Lungs clear. Bronchovascular markings normal. Pulmonary vascularity normal. No visible pleural effusions. No pneumothorax. Mild degenerative changes involving the thoracic spine. No interval change. IMPRESSION: No acute cardiopulmonary disease.  Stable examination. Electronically Signed   By: Evangeline Dakin M.D.   On: 09/17/2016 10:10    Procedures Procedures (including critical care time)  Medications Ordered in ED Medications  cyclobenzaprine (FLEXERIL) tablet 5 mg (5 mg Oral Given 09/17/16 1303)  ketorolac (TORADOL)  30 MG/ML injection 20 mg (20 mg Intramuscular Given 09/17/16 1310)     Initial Impression / Assessment and Plan / ED Course  I have reviewed the triage vital signs and the nursing notes.  Pertinent labs & imaging results that were available during my care of the patient were reviewed by me and considered in my medical decision making (see chart for details).  Clinical Course    CP likely related to MSK. Doubt ACS with normal ecg's and troponins. Doubt PE. No e/o PNA or other causes on workup here.   Final Clinical Impressions(s) / ED Diagnoses   Final diagnoses:  Muscular pain  Chest pain, unspecified chest pain type    New Prescriptions Discharge Medication List as of 09/17/2016  3:14 PM    START taking these medications   Details  diazepam (VALIUM) 5 MG tablet Take 0.5 tablets (2.5 mg total) by mouth every 12 (twelve) hours as needed for muscle spasms., Starting Tue 09/17/2016, Print    ibuprofen (ADVIL,MOTRIN) 400 MG tablet Take 1 tablet (400 mg total) by mouth 3 (three) times daily., Starting Tue 09/17/2016, Print         Merrily Pew, MD 09/18/16 1739

## 2016-09-17 NOTE — ED Triage Notes (Signed)
Pt reports she has chest pain that started yesterday morning. She describes it as a pressure and a dull ache. Pt reports hx of heart murmur.

## 2016-09-19 ENCOUNTER — Encounter: Payer: Self-pay | Admitting: Internal Medicine

## 2016-09-19 ENCOUNTER — Ambulatory Visit (INDEPENDENT_AMBULATORY_CARE_PROVIDER_SITE_OTHER): Payer: Medicare Other | Admitting: Internal Medicine

## 2016-09-19 DIAGNOSIS — Z5189 Encounter for other specified aftercare: Secondary | ICD-10-CM | POA: Diagnosis present

## 2016-09-19 DIAGNOSIS — R55 Syncope and collapse: Secondary | ICD-10-CM

## 2016-09-19 DIAGNOSIS — M62838 Other muscle spasm: Secondary | ICD-10-CM | POA: Diagnosis not present

## 2016-09-19 DIAGNOSIS — M545 Low back pain, unspecified: Secondary | ICD-10-CM

## 2016-09-19 DIAGNOSIS — F17210 Nicotine dependence, cigarettes, uncomplicated: Secondary | ICD-10-CM

## 2016-09-19 MED ORDER — DICLOFENAC SODIUM 75 MG PO TBEC: 75.0000 mg | DELAYED_RELEASE_TABLET | Freq: Two times a day (BID) | ORAL | 0 refills | Status: DC | PRN

## 2016-09-19 NOTE — Progress Notes (Signed)
   CC: here for an ED f/u for chest pain  HPI:  [name] is a 45 y.o. woman with a past medical history listed below here today for follow up of her recent ED f/u for chest pain.   Also here to f/u for syncope after being seen 9/18 in [facility].  For details of today's visit and the status of her chronic medical issues please refer to the assessment and plan.   Past Medical History:  Diagnosis Date  . Anemia   . Anxiety   . Anxiety and depression   . Asthma   . Bipolar 1 disorder ([place])   . Depression   . GERD (gastroesophageal reflux disease)   . Heart murmur   . Hemorrhoids   . Hiatal hernia   . Internal hemorrhoids     Review of Systems:   Please see pertinent ROS reviewed in HPI and problem based charting.   Physical Exam:  Vitals:   09/19/16 1425  BP: 130/78  Pulse: 73  Temp: 98.1 F (36.7 C)  TempSrc: Oral  SpO2: 99%  Weight: 197 lb 3.2 oz (89.4 kg)  Height: 5\' 6"  (1.676 m)   Physical Exam  Constitutional: She is oriented to person, place, and time and well-developed, well-nourished, and in no distress. No distress.  Cardiovascular: Normal rate and normal heart sounds.   No murmur heard. Pulmonary/Chest: Effort normal.  Neurological: She is alert and oriented to person, place, and time. Gait normal.  Skin: Skin is warm and dry.  Psychiatric: Mood and affect normal.     Assessment & Plan:   See Encounters Tab for problem based charting.  Patient discussed with Dr. Dareen Piano.  Syncope A: Patient reports no further syncopal events since being seen by Dr. Benjamine Mola on 09/06/16.  She did see her psychiatrist at [facility] who agreed that the events were associated with panic attacks.  They wanted to start Klonopin as needed but were unwilling to do so until she stopped Tramadol for back pain.  She is agreeable to stopping Tramadol.  P: - discontinue Tramadol for back pain and use Diclofenac as needed. - follow up with [facility] for Klonopin management and  anxiety.  Low back pain A: Only using Tramadol as needed for back pain.  Also has Diclofenac and Zanaflex as needed.  She is willing to come off the Tramadol in order to be treated for her panic attacks.  P: - discontinue Tramadol - follow up her back pain with as needed anti-inflammatories and tizanidine.  Muscle spasm A: Seen in the ED recently for chest wall pain felt to be a muscle spasm after ruling out life threatening causes.  Sent home on iburprofen and Valium from the ED.  Her pain is almost completely resolved and she has only taken 2 valium.  P: - discontinue valium and ibuprofen as she is already prescribed diclofenac

## 2016-09-19 NOTE — Patient Instructions (Addendum)
Thank you for coming to see me today. It was a pleasure. Today we talked about:   We have removed Tramadol from your medication list.  We have removed Valium from your medication list.  We have removed Ibuprofen from your medication list.  Please follow up with your Psychiatrist for your Klonopin.  Continue Diclofenac two  times per day only as needed for back pain.  If you are requiring this frequently on a 2 times per day basis please let us know so we can consider other therapy options.  Please follow-up with me in 6 months or sooner if needed.  If you have any questions or concerns, please do not hesitate to call the office at [phone].  Take Care,   Jule Ser, DO

## 2016-09-20 NOTE — Assessment & Plan Note (Signed)
A: Patient reports no further syncopal events since being seen by Dr. Benjamine Mola on 09/06/16.  She did see her psychiatrist at [facility] who agreed that the events were associated with panic attacks.  They wanted to start Klonopin as needed but were unwilling to do so until she stopped Tramadol for back pain.  She is agreeable to stopping Tramadol.  P: - discontinue Tramadol for back pain and use Diclofenac as needed. - follow up with [facility] for Klonopin management and anxiety.

## 2016-09-20 NOTE — Assessment & Plan Note (Signed)
A: Only using Tramadol as needed for back pain.  Also has Diclofenac and Zanaflex as needed.  She is willing to come off the Tramadol in order to be treated for her panic attacks.  P: - discontinue Tramadol - follow up her back pain with as needed anti-inflammatories and tizanidine.

## 2016-09-20 NOTE — Assessment & Plan Note (Signed)
A: Seen in the ED recently for chest wall pain felt to be a muscle spasm after ruling out life threatening causes.  Sent home on iburprofen and Valium from the ED.  Her pain is almost completely resolved and she has only taken 2 valium.  P: - discontinue valium and ibuprofen as she is already prescribed diclofenac

## 2016-09-23 NOTE — Progress Notes (Signed)
Internal Medicine Clinic Attending  Case discussed with Dr. Wallace at the time of the visit.  We reviewed the resident's history and exam and pertinent patient test results.  I agree with the assessment, diagnosis, and plan of care documented in the resident's note.  

## 2016-09-26 ENCOUNTER — Other Ambulatory Visit: Payer: Self-pay | Admitting: Internal Medicine

## 2016-09-26 ENCOUNTER — Encounter: Payer: Medicare Other | Admitting: Internal Medicine

## 2016-09-26 ENCOUNTER — Ambulatory Visit
Admission: RE | Admit: 2016-09-26 | Discharge: 2016-09-26 | Disposition: A | Discharge status: Home / Self Care | Payer: Medicare Other | Source: Ambulatory Visit | Attending: Nurse Practitioner | Admitting: Nurse Practitioner

## 2016-09-26 DIAGNOSIS — Z1231 Encounter for screening mammogram for malignant neoplasm of breast: Secondary | ICD-10-CM | POA: Diagnosis not present

## 2016-09-26 DIAGNOSIS — R030 Elevated blood-pressure reading, without diagnosis of hypertension: Secondary | ICD-10-CM

## 2016-09-26 MED ORDER — HYDROCHLOROTHIAZIDE 25 MG PO TABS: 25.0000 mg | ORAL_TABLET | Freq: Every day | ORAL | 2 refills | Status: DC

## 2016-09-26 NOTE — Telephone Encounter (Signed)
Changed HCTZ 12.5mg  2 pills daily to HCTZ 25mg  take 1 pill daily.

## 2016-09-29 ENCOUNTER — Other Ambulatory Visit: Payer: Self-pay | Admitting: Internal Medicine

## 2016-09-29 DIAGNOSIS — R030 Elevated blood-pressure reading, without diagnosis of hypertension: Secondary | ICD-10-CM

## 2016-10-01 ENCOUNTER — Telehealth: Payer: Self-pay | Admitting: Internal Medicine

## 2016-10-01 NOTE — Telephone Encounter (Signed)
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  Please advise.

## 2016-10-02 NOTE — Telephone Encounter (Signed)
Rec'd phone call this morning from [facility] Shirlean Mylar) [phone].  They will be faxing over a note for your review in order to process this referral sooner.

## 2016-10-04 NOTE — Telephone Encounter (Signed)
I reviewed her note from the [facility] and it did not provide much information in to what may be going on.  I would recommend she be seen first in [facility] to assess her need for neurology referral vs management in [facility] or potential other type of referral.  Thanks!

## 2016-10-09 ENCOUNTER — Telehealth: Payer: Self-pay | Admitting: Internal Medicine

## 2016-10-09 NOTE — Telephone Encounter (Signed)
APT. REMINDER CALL, LMTCB °

## 2016-10-10 ENCOUNTER — Encounter: Payer: Self-pay | Admitting: Internal Medicine

## 2016-10-10 ENCOUNTER — Encounter: Payer: Medicare Other | Admitting: Internal Medicine

## 2016-10-16 DIAGNOSIS — F3181 Bipolar II disorder: Secondary | ICD-10-CM | POA: Diagnosis not present

## 2016-10-17 ENCOUNTER — Ambulatory Visit (INDEPENDENT_AMBULATORY_CARE_PROVIDER_SITE_OTHER): Payer: Medicare Other | Admitting: Internal Medicine

## 2016-10-17 ENCOUNTER — Encounter: Payer: Self-pay | Admitting: Internal Medicine

## 2016-10-17 VITALS — BP 137/88 | HR 79 | Temp 98.0°F | Ht 66.0 in | Wt 192.8 lb

## 2016-10-17 DIAGNOSIS — F410 Panic disorder [episodic paroxysmal anxiety] without agoraphobia: Secondary | ICD-10-CM | POA: Diagnosis not present

## 2016-10-17 DIAGNOSIS — R55 Syncope and collapse: Secondary | ICD-10-CM

## 2016-10-17 DIAGNOSIS — Z90710 Acquired absence of both cervix and uterus: Secondary | ICD-10-CM

## 2016-10-17 DIAGNOSIS — F17200 Nicotine dependence, unspecified, uncomplicated: Secondary | ICD-10-CM

## 2016-10-17 DIAGNOSIS — Z0000 Encounter for general adult medical examination without abnormal findings: Secondary | ICD-10-CM

## 2016-10-17 DIAGNOSIS — F319 Bipolar disorder, unspecified: Secondary | ICD-10-CM

## 2016-10-17 DIAGNOSIS — F17210 Nicotine dependence, cigarettes, uncomplicated: Secondary | ICD-10-CM

## 2016-10-17 NOTE — Progress Notes (Signed)
   CC: here for medication refills for HTN and discuss panic attacks  HPI:  [name] is a 45 y.o. woman with a past medical history listed below here today for follow up of her chronic medical conditions for which she requests refills and to discuss her panic attacks.  For details of today's visit and the status of her chronic medical issues please refer to the assessment and plan.   Past Medical History:  Diagnosis Date  . Anemia   . Anxiety   . Anxiety and depression   . Asthma   . Bipolar 1 disorder ([place])   . Depression   . GERD (gastroesophageal reflux disease)   . Heart murmur   . Hemorrhoids   . Hiatal hernia   . Internal hemorrhoids     Review of Systems:   Please see pertinent ROS reviewed in HPI and problem based charting.   Physical Exam:  Vitals:   10/17/16 1531  BP: 137/88  Pulse: 79  Temp: 98 F (36.7 C)  TempSrc: Oral  SpO2: 100%  Weight: 192 lb 12.8 oz (87.5 kg)  Height: 5\' 6"  (1.676 m)   Physical Exam  Constitutional: She is oriented to person, place, and time and well-developed, well-nourished, and in no distress.  HENT:  Head: Normocephalic and atraumatic.  Pulmonary/Chest: Effort normal.  Neurological: She is alert and oriented to person, place, and time.  Skin: Skin is warm and dry.  Psychiatric: Mood and affect normal.     Assessment & Plan:   See Encounters Tab for problem based charting.  Patient discussed with Dr. Eppie Gibson.  Panic attacks A: Patient reports that she followed up with [facility] yesterday regarding starting as needed Klonopin for her panic attacks.  She reports the provider there refused to start this medication still due to diazepam and tramadol on her medication list despite having these meds discontinued last month.  She is inquiring about being able to see a new psychiatrist to manage her behavioral health needs.  P: - referral to psychiatry placed - continue to follow up with [facility] until then - suggested  she attempt another appointment with [facility] in case of a misunderstanding regarding her Klonopin.  Health care maintenance A/P:  Received office visit notes from her last OB visit in November 2015.  Appears last Pap was in 2002 before her total hysterectomy due to fibroids.  She had no history of abnormal Paps.    Tobacco use  A: Patient reports smoking about 1/2 pack per day.  Has attempted to quit in the past and is currently interested in cutting back.  Reports her insurance has not covered NRT in the past.  P: - encouraged smoking cessation - provided information on the [place] Quitline

## 2016-10-17 NOTE — Patient Instructions (Addendum)
Thank you for coming to see me today. It was a pleasure. Today we talked about:   I have put in a referral today for you to be seen by a new psychiatrist.  I will refill your medications as we discussed.  Please follow-up with me in 6 months or sooner if needed.  Please think about quitting smoking.  This is very important for your health.  There are many things we can do to help you quit.  Let  us know if you are interested.  You can also call 1-800-QUIT-NOW (727)277-2406) for free smoking cessation counseling.   If you have any questions or concerns, please do not hesitate to call the office at [phone].  Take Care,   Jule Ser, DO

## 2016-10-18 DIAGNOSIS — F17200 Nicotine dependence, unspecified, uncomplicated: Secondary | ICD-10-CM | POA: Insufficient documentation

## 2016-10-18 MED ORDER — HYDROCHLOROTHIAZIDE 25 MG PO TABS: 25.0000 mg | ORAL_TABLET | Freq: Every day | ORAL | 2 refills | Status: DC

## 2016-10-18 MED ORDER — ALBUTEROL SULFATE HFA 108 (90 BASE) MCG/ACT IN AERS: 2.0000 | INHALATION_SPRAY | Freq: Four times a day (QID) | RESPIRATORY_TRACT | 3 refills | Status: DC | PRN

## 2016-10-18 MED ORDER — TIZANIDINE HCL 2 MG PO TABS: 2.0000 mg | ORAL_TABLET | Freq: Three times a day (TID) | ORAL | 0 refills | Status: DC | PRN

## 2016-10-18 MED ORDER — OMEPRAZOLE 40 MG PO CPDR: 40.0000 mg | DELAYED_RELEASE_CAPSULE | Freq: Every day | ORAL | 1 refills | Status: DC

## 2016-10-18 MED ORDER — DICLOFENAC SODIUM 75 MG PO TBEC: 75.0000 mg | DELAYED_RELEASE_TABLET | Freq: Two times a day (BID) | ORAL | 1 refills | Status: DC | PRN

## 2016-10-18 NOTE — Assessment & Plan Note (Signed)
A: Patient reports smoking about 1/2 pack per day.  Has attempted to quit in the past and is currently interested in cutting back.  Reports her insurance has not covered NRT in the past.  P: - encouraged smoking cessation - provided information on the [place] Quitline

## 2016-10-18 NOTE — Assessment & Plan Note (Signed)
A/P:  Received office visit notes from her last OB visit in November 2015.  Appears last Pap was in 2002 before her total hysterectomy due to fibroids.  She had no history of abnormal Paps.

## 2016-10-18 NOTE — Assessment & Plan Note (Signed)
A: Patient reports that she followed up with [facility] yesterday regarding starting as needed Klonopin for her panic attacks.  She reports the provider there refused to start this medication still due to diazepam and tramadol on her medication list despite having these meds discontinued last month.  She is inquiring about being able to see a new psychiatrist to manage her behavioral health needs.  P: - referral to psychiatry placed - continue to follow up with [facility] until then - suggested she attempt another appointment with [facility] in case of a misunderstanding regarding her Klonopin.

## 2016-10-21 ENCOUNTER — Telehealth: Payer: Self-pay | Admitting: Licensed Clinical Social Worker

## 2016-10-21 NOTE — Progress Notes (Signed)
Case discussed with Dr. Wallace soon after the resident saw the patient. We reviewed the resident's history and exam and pertinent patient test results. I agree with the assessment, diagnosis, and plan of care documented in the resident's note. 

## 2016-10-21 NOTE — Telephone Encounter (Signed)
Jasmine Jackson is currently establish at [facility] for [facility] services.  Pt would like to establish with another behavioral health provider.  PCP has placed referral. Pt is covered by both Medicare and Medicaid.  CSW utilized [employer] to obtain network provider within close proximity to pt's zip code and accepts medicare as well.  Options: [place], [facility] and [place].   CSW placed called to pt.  CSW left message requesting return call. CSW provided contact hours and phone number.

## 2016-10-23 NOTE — Telephone Encounter (Signed)
CSW placed call to Ms. Millward. Ms. Whitler requesting to have CSW providers several agencies over the phone.  Pt inquired about the difference between behavioral health services psychotherapy vs psychiatry.   Ms. Vanover is covered by both Medicare and Medicaid insurance at this time.  CSW utilized [employer] to [employer].  CSW offered to provide listing over the phone and/or mail provider information.  Per pt's request, pt provided with the contact information to [place], [facility] and [place] and [place].  CSW answered pt's questions regarding the differences between behavioral health services and pt informed better results obtained when patient's utilize therapy along with medication management.  Pt informed all of the above agencies offer therapy and medication management.  CSW reinforced PCP is requesting psychiatry services specifically, therapy services would also be beneficial.   Ms. Bolash to look into agencies provided and choose a location that works best for her, pt states she is able to contact agency of choice directly.   Pt aware CSW is available to assist as needed.  CSW contact information and work hours provided.  Pt denies add'l social work needs at this time.

## 2016-11-20 ENCOUNTER — Telehealth: Payer: Self-pay

## 2016-11-20 NOTE — Telephone Encounter (Signed)
Patient was contacted via telephone for smoking cessation consultation. Based on Fagerstrom Test for Nicotine Dependence score of 6, patient is currently moderately dependent. Jasmine Jackson states that she had a recent successful attempt at smoking cessation of approximately 6 months with nicotine gum, but relapsed due to weight gain. Patient appears to be extremely motivated and wants to completely stop smoking before Christmas, but is concerned about potential weight gain. Patient was therefore prescribed Bupropion SR 150 mg BID and Nicotine gum 2 mg upon quit date. Patient requested face-to-face appointment with Dr. Maudie Mercury to discuss therapy more in-depth.

## 2016-11-22 ENCOUNTER — Telehealth: Payer: Self-pay | Admitting: Licensed Clinical Social Worker

## 2016-11-22 ENCOUNTER — Ambulatory Visit: Payer: Medicare Other | Admitting: Pharmacist

## 2016-11-22 NOTE — Telephone Encounter (Signed)
CSW attempted to contact Ms. Paola to follow up on behavioral health referral.  No answer at this time, no voice mail.

## 2016-11-26 NOTE — Telephone Encounter (Signed)
CSW placed called to pt.  CSW left message requesting return call. CSW provided contact hours and phone number. 

## 2016-11-27 ENCOUNTER — Telehealth: Payer: Self-pay | Admitting: *Deleted

## 2016-11-27 ENCOUNTER — Other Ambulatory Visit: Payer: Self-pay | Admitting: Student in an Organized Health Care Education/Training Program

## 2016-11-27 ENCOUNTER — Telehealth: Payer: Self-pay

## 2016-11-27 ENCOUNTER — Encounter: Payer: Self-pay | Admitting: *Deleted

## 2016-11-27 ENCOUNTER — Ambulatory Visit (INDEPENDENT_AMBULATORY_CARE_PROVIDER_SITE_OTHER): Payer: Medicare Other | Admitting: Internal Medicine

## 2016-11-27 ENCOUNTER — Encounter: Payer: Self-pay | Admitting: Internal Medicine

## 2016-11-27 VITALS — BP 145/87 | HR 78 | Temp 98.2°F | Wt 199.9 lb

## 2016-11-27 DIAGNOSIS — Z79899 Other long term (current) drug therapy: Secondary | ICD-10-CM | POA: Diagnosis not present

## 2016-11-27 DIAGNOSIS — Z90711 Acquired absence of uterus with remaining cervical stump: Secondary | ICD-10-CM

## 2016-11-27 DIAGNOSIS — Z803 Family history of malignant neoplasm of breast: Secondary | ICD-10-CM | POA: Diagnosis not present

## 2016-11-27 DIAGNOSIS — F17210 Nicotine dependence, cigarettes, uncomplicated: Secondary | ICD-10-CM

## 2016-11-27 DIAGNOSIS — R232 Flushing: Secondary | ICD-10-CM | POA: Diagnosis not present

## 2016-11-27 DIAGNOSIS — N951 Menopausal and female climacteric states: Secondary | ICD-10-CM | POA: Diagnosis present

## 2016-11-27 DIAGNOSIS — F319 Bipolar disorder, unspecified: Secondary | ICD-10-CM

## 2016-11-27 MED ORDER — VITAMIN E 180 MG (400 UNIT) PO CAPS: 400.0000 [IU] | ORAL_CAPSULE | Freq: Every day | ORAL | 3 refills | Status: AC

## 2016-11-27 MED ORDER — BUPROPION HCL ER (SR) 150 MG PO TB12: ORAL_TABLET | ORAL | 2 refills | Status: DC

## 2016-11-27 NOTE — Telephone Encounter (Signed)
Sent to dr Juleen China

## 2016-11-27 NOTE — Telephone Encounter (Signed)
Requesting gum or patch for quit smoking. Please call pt back.

## 2016-11-27 NOTE — Progress Notes (Signed)
   CC: hot flashes  HPI:  [name] is a 45 y.o. w/ PMHX as outlined below who presents to clinic for hot flashes. She has had hot flashes since she was 45 years old. She had a partial hysterectomy 2/2 multiple fibroids.   Past Medical History:  Diagnosis Date  . Anemia   . Anxiety   . Anxiety and depression   . Asthma   . Bipolar 1 disorder ([place])   . Depression   . GERD (gastroesophageal reflux disease)   . Heart murmur   . Hemorrhoids   . Hiatal hernia   . Internal hemorrhoids     Review of Systems:  Denies chest pain, n/v, cough, and dysuria.  Physical Exam:  Vitals:   11/27/16 0912  BP: (!) 145/87  Pulse: 78  Temp: 98.2 F (36.8 C)  TempSrc: Oral  SpO2: 100%  Weight: 199 lb 14.4 oz (90.7 kg)   Physical Exam  Constitutional: She is oriented to person, place, and time. She appears well-developed and well-nourished. No distress.  HENT:  Head: Normocephalic and atraumatic.  Nose: Nose normal.  Eyes: Conjunctivae and EOM are normal. No scleral icterus.  Cardiovascular: Normal rate, regular rhythm and normal heart sounds.  Exam reveals no gallop and no friction rub.   No murmur heard. Pulmonary/Chest: Effort normal and breath sounds normal. No respiratory distress. She has no wheezes. She has no rales.  Abdominal: Soft. Bowel sounds are normal. She exhibits no distension. There is no tenderness. There is no rebound and no guarding.  Neurological: She is alert and oriented to person, place, and time.  Skin: Skin is warm and dry. No rash noted. She is not diaphoretic. No erythema. No pallor.    Assessment & Plan:   See Encounters Tab for problem based charting.  Patient discussed with Dr. Evette Doffing

## 2016-11-27 NOTE — Patient Instructions (Signed)
Start taking Vitamin E 400IU daily.   Menopause Menopause is the normal time of life when menstrual periods stop completely. Menopause is complete when you have missed 12 consecutive menstrual periods. It usually occurs between the ages of 14 years and 37 years. Very rarely does a woman develop menopause before the age of 64 years. At menopause, your ovaries stop producing the female hormones estrogen and progesterone. This can cause undesirable symptoms and also affect your health. Sometimes the symptoms may occur 4-5 years before the menopause begins. There is no relationship between menopause and:  Oral contraceptives.  Number of children you had.  Race.  The age your menstrual periods started (menarche). Heavy smokers and very thin women may develop menopause earlier in life. What are the causes?  The ovaries stop producing the female hormones estrogen and progesterone. Other causes include:  Surgery to remove both ovaries.  The ovaries stop functioning for no known reason.  Tumors of the pituitary gland in the brain.  Medical disease that affects the ovaries and hormone production.  Radiation treatment to the abdomen or pelvis.  Chemotherapy that affects the ovaries. What are the signs or symptoms?  Hot flashes.  Night sweats.  Decrease in sex drive.  Vaginal dryness and thinning of the vagina causing painful intercourse.  Dryness of the skin and developing wrinkles.  Headaches.  Tiredness.  Irritability.  Memory problems.  Weight gain.  Bladder infections.  Hair growth of the face and chest.  Infertility. More serious symptoms include:  Loss of bone (osteoporosis) causing breaks (fractures).  Depression.  Hardening and narrowing of the arteries (atherosclerosis) causing heart attacks and strokes. How is this diagnosed?  When the menstrual periods have stopped for 12 straight months.  Physical exam.  Hormone studies of the blood. How is this  treated? There are many treatment choices and nearly as many questions about them. The decisions to treat or not to treat menopausal changes is an individual choice made with your health care provider. Your health care provider can discuss the treatments with you. Together, you can decide which treatment will work best for you. Your treatment choices may include:  Hormone therapy (estrogen and progesterone).  Non-hormonal medicines.  Treating the individual symptoms with medicine (for example antidepressants for depression).  Herbal medicines that may help specific symptoms.  Counseling by a psychiatrist or psychologist.  Group therapy.  Lifestyle changes including:  Eating healthy.  Regular exercise.  Limiting caffeine and alcohol.  Stress management and meditation.  No treatment. Follow these instructions at home:  Take the medicine your health care provider gives you as directed.  Get plenty of sleep and rest.  Exercise regularly.  Eat a diet that contains calcium (good for the bones) and soy products (acts like estrogen hormone).  Avoid alcoholic beverages.  Do not smoke.  If you have hot flashes, dress in layers.  Take supplements, calcium, and vitamin D to strengthen bones.  You can use over-the-counter lubricants or moisturizers for vaginal dryness.  Group therapy is sometimes very helpful.  Acupuncture may be helpful in some cases. Contact a health care provider if:  You are not sure you are in menopause.  You are having menopausal symptoms and need advice and treatment.  You are still having menstrual periods after age 11 years.  You have pain with intercourse.  Menopause is complete (no menstrual period for 12 months) and you develop vaginal bleeding.  You need a referral to a specialist (gynecologist, psychiatrist, or psychologist) for  treatment. Get help right away if:  You have severe depression.  You have excessive vaginal  bleeding.  You fell and think you have a broken bone.  You have pain when you urinate.  You develop leg or chest pain.  You have a fast pounding heart beat (palpitations).  You have severe headaches.  You develop vision problems.  You feel a lump in your breast.  You have abdominal pain or severe indigestion. This information is not intended to replace advice given to you by your health care provider. Make sure you discuss any questions you have with your health care provider. Document Released: 02/29/2004 Document Revised: 05/16/2016 Document Reviewed: 07/08/2013 Elsevier Interactive Patient Education  2017 [employer].

## 2016-11-27 NOTE — Telephone Encounter (Signed)
Pt was here in [facility] this am; stated she suppose to start Bupropion on the 18th for smoking but she needs a rx. Stated she had received a call from Dr Juleen China. Thanks

## 2016-11-27 NOTE — Telephone Encounter (Signed)
CSW met with Jasmine Jackson following her scheduled [facility] appointment.  Pt states she has scheduled an appointment Jan 2018 with [facility] and [place].

## 2016-11-27 NOTE — Telephone Encounter (Signed)
Dr. Juleen China is on vacation. I sent burpropion Rx to [facility]. She should start this medicine one week before quit day for best effect. No interaction warnings seen on UTD or Epic warnings with other psych meds.

## 2016-11-28 NOTE — Telephone Encounter (Signed)
Pt called / informed of Bupropion rx. Asked about patch or gum - informed Dr Juleen China is out at this time but will forward request.

## 2016-11-28 NOTE — Assessment & Plan Note (Addendum)
Assessment: Patient presenting with hot flashes that she's had since she was 45 years old. She states that hot flashes have worse then and occur every 10-15 minutes. She has tried lowering her house thermometer and states she can no longer sleep upstairs and will sleep downstairs basement due to the temperature. She'll wake up covered in sweat will have a change sheets. She also states that she has been irritable due to this which her family members have noticed. She has a partial hysterectomy secondary to multiple uterine fibroids. She does not have her period. She has a strong family history of breast cancer. Her mother and 3 maternal aunts had breast cancer. She gets her yearly mammograms which have been negative. She is on Prozac and Seroquel for bipolar disorder.  Plan: Discussed hormone therapy, gabapentin, and venlafaxine. Patient would like to try vitamin E first for vasomotor symptom control. Patient to call the clinic in 4 weeks for an appointment if symptoms are not improved with this. Rx for low dose Vit E 400 IU qd. Also discussed weight loss to help w/ symptoms.

## 2016-11-28 NOTE — Progress Notes (Signed)
Internal Medicine Clinic Attending  Case discussed with Dr. Truong at the time of the visit.  We reviewed the resident's history and exam and pertinent patient test results.  I agree with the assessment, diagnosis, and plan of care documented in the resident's note.  

## 2016-12-03 ENCOUNTER — Other Ambulatory Visit: Payer: Self-pay | Admitting: Internal Medicine

## 2016-12-03 MED ORDER — NICOTINE POLACRILEX 2 MG MT GUM: 2.0000 mg | CHEWING_GUM | OROMUCOSAL | 1 refills | Status: DC | PRN

## 2016-12-03 NOTE — Telephone Encounter (Signed)
Nicorette 2mg  gum sent electronically to her pharmacy.

## 2016-12-03 NOTE — Telephone Encounter (Signed)
Patient was contacted with Frank Tillman, PharmD candidate. I agree with the assessment and plan of care documented.  

## 2016-12-05 ENCOUNTER — Ambulatory Visit (INDEPENDENT_AMBULATORY_CARE_PROVIDER_SITE_OTHER): Payer: Medicare Other | Admitting: Pharmacist

## 2016-12-05 DIAGNOSIS — Z79899 Other long term (current) drug therapy: Secondary | ICD-10-CM

## 2016-12-05 DIAGNOSIS — Z885 Allergy status to narcotic agent status: Secondary | ICD-10-CM | POA: Diagnosis not present

## 2016-12-05 DIAGNOSIS — Z886 Allergy status to analgesic agent status: Secondary | ICD-10-CM

## 2016-12-05 DIAGNOSIS — Z7189 Other specified counseling: Secondary | ICD-10-CM | POA: Diagnosis present

## 2016-12-05 DIAGNOSIS — F17210 Nicotine dependence, cigarettes, uncomplicated: Secondary | ICD-10-CM | POA: Diagnosis not present

## 2016-12-05 DIAGNOSIS — F17200 Nicotine dependence, unspecified, uncomplicated: Secondary | ICD-10-CM

## 2016-12-05 NOTE — Progress Notes (Addendum)
S: Jasmine Jackson is a 45 y.o. female reports to clinical pharmacist appointment for tobacco cessation.   Allergies  Allergen Reactions  . Asa [Aspirin] Other (See Comments)    Heart flutters  . Morphine And Related Itching    Itching w/o rash   Medication Sig  albuterol (PROVENTIL HFA;VENTOLIN HFA) 108 (90 Base) MCG/ACT inhaler Inhale 2 puffs into the lungs every 6 (six) hours as needed for wheezing or shortness of breath.  buPROPion (WELLBUTRIN SR) 150 MG 12 hr tablet 150 mg oral daily for 1 week, then increase to 150 mg oral twice daily.  busPIRone (BUSPAR) 15 MG tablet Take 30 mg by mouth 2 (two) times daily.  colchicine (COLCRYS) 0.6 MG tablet Take 1 tablet (0.6 mg total) by mouth daily. Patient taking differently: Take 0.6 mg by mouth daily as needed (gout.).   diclofenac (VOLTAREN) 75 MG EC tablet Take 1 tablet (75 mg total) by mouth 2 (two) times daily as needed for mild pain.  dicyclomine (BENTYL) 10 MG capsule Take 1 capsule (10 mg total) by mouth 4 (four) times daily -  before meals and at bedtime.  FLUoxetine (PROZAC) 40 MG capsule Take 80 mg by mouth every morning. Take two capsules every morning  gabapentin (NEURONTIN) 800 MG tablet Take 800 mg by mouth 3 (three) times daily.  hydrochlorothiazide (HYDRODIURIL) 25 MG tablet Take 1 tablet (25 mg total) by mouth daily.  hydrOXYzine (VISTARIL) 25 MG capsule Take 25 mg by mouth 3 (three) times daily as needed for itching.   nicotine polacrilex (NICORETTE) 2 MG gum Take 1 each (2 mg total) by mouth as needed for smoking cessation.  omeprazole (PRILOSEC) 40 MG capsule Take 1 capsule (40 mg total) by mouth daily.  SEROQUEL XR 400 MG 24 hr tablet Take 400 mg by mouth at bedtime.   tiZANidine (ZANAFLEX) 2 MG tablet Take 1 tablet (2 mg total) by mouth every 8 (eight) hours as needed for muscle spasms.  vitamin E 400 UNIT capsule Take 1 capsule (400 Units total) by mouth daily.   Past Medical History:  Diagnosis Date  . Anemia   .  Anxiety   . Anxiety and depression   . Asthma   . Bipolar 1 disorder ([place])   . Depression   . GERD (gastroesophageal reflux disease)   . Heart murmur   . Hemorrhoids   . Hiatal hernia   . Internal hemorrhoids    Social History   Social History  . Marital status: Single    Spouse name: N/A  . Number of children: 1  . Years of education: N/A   Occupational History  . Disability    Social History Main Topics  . Smoking status: Current Every Day Smoker    Packs/day: 0.80    Types: Cigarettes  . Smokeless tobacco: Never Used     Comment: 15 per day.  . Alcohol use No  . Drug use: No  . Sexual activity: Not on file   Other Topics Concern  . Not on file   Social History Narrative  . No narrative on file   Family History  Problem Relation Age of Onset  . Breast cancer Maternal Aunt     x4  . Stomach cancer Maternal Aunt   . Prostate cancer Maternal Uncle     x2  . Breast cancer Maternal Grandmother   . Celiac disease Mother   . Colon cancer Neg Hx   . Esophageal cancer Neg Hx   . Rectal  cancer Neg Hx     O:    Component Value Date/Time   CHOL 159 04/24/2015 1428   HDL 40.30 04/24/2015 1428   TRIG 149.0 04/24/2015 1428   AST 16 11/29/2015 1024   ALT 16 11/29/2015 1024   NA 140 09/17/2016 0934   NA 140 08/01/2016 1441   K 3.6 09/17/2016 0934   CL 104 09/17/2016 0934   CO2 28 09/17/2016 0934   GLUCOSE 96 09/17/2016 0934   HGBA1C 5.5 04/24/2015 1428   BUN 8 09/17/2016 0934   BUN 11 08/01/2016 1441   CREATININE 0.76 09/17/2016 0934   CALCIUM 9.9 09/17/2016 0934   GFRAA >60 09/17/2016 0934   WBC 7.3 09/17/2016 0934   HGB 13.7 09/17/2016 0934   HCT 42.3 09/17/2016 0934   PLT 312 09/17/2016 0934   Ht Readings from Last 2 Encounters:  10/17/16 5\' 6"  (1.676 m)  09/19/16 5\' 6"  (1.676 m)   Wt Readings from Last 2 Encounters:  11/27/16 199 lb 14.4 oz (90.7 kg)  10/17/16 192 lb 12.8 oz (87.5 kg)   There is no height or weight on file to calculate  BMI. BP Readings from Last 3 Encounters:  11/27/16 (!) 145/87  10/17/16 137/88  09/19/16 130/78   A/P:  Patient is ready to quit smoking, quit date scheduled for 12/09/16  Fagerstrom Test for Nicotine Dependence score of 6, patient is currently moderately dependent.  [name] states that she had a recent successful attempt at smoking cessation of approximately 6 months with nicotine gum, but relapsed due to weight gain. Patient appears to be extremely motivated and wants to completely stop smoking before Christmas, but is concerned about potential weight gain. Patient was therefore prescribed Bupropion SR 150 mg BID and Nicotine gum 2 mg upon quit date through collaboration with PCP. Education was provided on these agents  Patient states nicotine gum was not covered by her insurance---contacted pharmacy to resolve, but they were unsuccessful  Provided education on self-management strategies and gave information on free  smoking cessation classes  Patient states she is also interested and will look into aromatherapy---requested to receive samples at follow-up appointment with me  An after visit summary was provided and patient advised to follow up in 2-3 weeks or sooner if any changes in condition or questions regarding medications arise.   The patient verbalized understanding of information provided by repeating back concepts discussed.   15 minutes spent face-to-face with the patient during the encounter. 50% of time spent on education. 50% of time was spent on assessment and plan.

## 2016-12-05 NOTE — Patient Instructions (Signed)
Congratulations on your decision to quit smoking! Your quit date is set on 12/09/16, start bupropion as prescribed. Prepare ahead of time and think of plans to manage cravings. Contact me if any help needed. Looking forward to your success!

## 2016-12-10 ENCOUNTER — Other Ambulatory Visit: Payer: Self-pay | Admitting: Pharmacist

## 2016-12-10 DIAGNOSIS — F17200 Nicotine dependence, unspecified, uncomplicated: Secondary | ICD-10-CM

## 2016-12-10 MED ORDER — NICOTINE POLACRILEX 2 MG MT GUM: 2.0000 mg | CHEWING_GUM | OROMUCOSAL | 1 refills | Status: DC | PRN

## 2016-12-10 NOTE — Progress Notes (Signed)
Transferred nicotine gum to [facility]

## 2016-12-11 MED FILL — SM NICOTINE 2 MG CHEWING GU: 2 | 20 days supply | Qty: 110 | Fill #0

## 2016-12-12 ENCOUNTER — Encounter: Payer: Medicare Other | Admitting: Internal Medicine

## 2016-12-25 ENCOUNTER — Encounter: Payer: Self-pay | Admitting: Pharmacist

## 2016-12-25 ENCOUNTER — Ambulatory Visit: Payer: Medicare Other | Admitting: Pharmacist

## 2016-12-25 ENCOUNTER — Ambulatory Visit (INDEPENDENT_AMBULATORY_CARE_PROVIDER_SITE_OTHER): Payer: Medicare Other | Admitting: Internal Medicine

## 2016-12-25 ENCOUNTER — Other Ambulatory Visit (HOSPITAL_COMMUNITY)
Admission: RE | Admit: 2016-12-25 | Discharge: 2016-12-25 | Disposition: A | Discharge status: Home / Self Care | Payer: Medicare Other | Source: Ambulatory Visit | Attending: Student in an Organized Health Care Education/Training Program | Admitting: Student in an Organized Health Care Education/Training Program

## 2016-12-25 VITALS — BP 114/82 | HR 78 | Temp 98.8°F | Wt 204.9 lb

## 2016-12-25 DIAGNOSIS — F17210 Nicotine dependence, cigarettes, uncomplicated: Secondary | ICD-10-CM

## 2016-12-25 DIAGNOSIS — F17200 Nicotine dependence, unspecified, uncomplicated: Secondary | ICD-10-CM

## 2016-12-25 DIAGNOSIS — Z79899 Other long term (current) drug therapy: Secondary | ICD-10-CM

## 2016-12-25 DIAGNOSIS — N898 Other specified noninflammatory disorders of vagina: Secondary | ICD-10-CM

## 2016-12-25 DIAGNOSIS — Z719 Counseling, unspecified: Secondary | ICD-10-CM

## 2016-12-25 NOTE — Patient Instructions (Signed)
It was a pleasure to see you today Jasmine Jackson.  I will call you with the results of this test in approximately 24-48 hours. I can send a prescription to your pharmacy as well at that time.  Please let us know if you have additional problems.

## 2016-12-25 NOTE — Progress Notes (Signed)
   CC: Malodorous vaginal discharge  HPI:  [name] is a 46 y.o. woman with a history of anxiety and depression, asthma, and GERD who presents today with several days of a malodorous vaginal discharge. She states this is mostly clear and is nonpainful. She denies any associated dysuria, hematuria, or urinary frequency. She is not having any systemic symptoms. She worries that this is due to having unprotected intercourse recently when she has previously used barrier contraception in all previous encounters with her current partner. She has had only 2-3 sexual partners in the past year but has a high level of anxiety about her exposure and possible sexually transmitted infections.   See problem based assessment and plan below for additional details  Past Medical History:  Diagnosis Date  . Anemia   . Anxiety   . Anxiety and depression   . Asthma   . Bipolar 1 disorder ([place])   . Depression   . GERD (gastroesophageal reflux disease)   . Heart murmur   . Hemorrhoids   . Hiatal hernia   . Internal hemorrhoids     Review of Systems:  Review of Systems  Constitutional: Negative for fever.  Gastrointestinal: Negative for abdominal pain and blood in stool.  Genitourinary: Negative for dysuria, hematuria and urgency.  Musculoskeletal: Negative for myalgias.  Skin: Negative for itching.  Neurological: Negative for sensory change.  Psychiatric/Behavioral: The patient is nervous/anxious.     Physical Exam: Physical Exam  Constitutional: She is well-developed, well-nourished, and in no distress.  HENT:  Head: Normocephalic.  Mouth/Throat: Oropharynx is clear and moist.  Cardiovascular: Normal rate and regular rhythm.   Abdominal: Soft. She exhibits no distension. There is no tenderness. There is no rebound and no guarding.  Genitourinary: Cervix normal. Vulva exhibits no erythema and no lesion. Thin  fishy  white and vaginal discharge found.    There were no vitals filed  for this visit.   Assessment & Plan:   See Encounters Tab for problem based charting.  Patient seen with Dr. Evette Doffing

## 2016-12-26 LAB — CERVICOVAGINAL ANCILLARY ONLY
Chlamydia: NEGATIVE
NEISSERIA GONORRHEA: NEGATIVE

## 2016-12-26 NOTE — Progress Notes (Signed)
Patient follow up for tobacco cessation.   Nicotine dependence: mild, patient has gone from 16 cigarettes per day down to 1-2 cigarettes per day  Current regimen includes bupropion in addition to nicotine gum. Patient requested a sample of aromatherapy today. Provided Comfort Blend to patient along with education on how to use. Also provided patient with schedule for [facility] free tobacco cessation classes. Advised patient to contact clinic if questions/concerns arise. Patient verbalized understanding of information by repeating back.  Follow up 1 month or sooner if needed  Kim,Jennifer J 6:16 PM 12/26/2016

## 2016-12-27 LAB — CERVICOVAGINAL ANCILLARY ONLY: Wet Prep (BD Affirm): POSITIVE — AB

## 2016-12-27 MED ORDER — METRONIDAZOLE 500 MG PO TABS: 500.0000 mg | ORAL_TABLET | Freq: Two times a day (BID) | ORAL | 0 refills | Status: DC

## 2016-12-27 NOTE — Progress Notes (Signed)
The result of cervicovaginal wet prep and GC/CM was positive for Gardnerella only. This shows her symptoms are due to bacterial vaginosis. I called Ms. Osmundson and informed her of these results at 3:30 PM today. I informed her the treatment plan will be metronidazole 500 mg twice daily for 7 days. She expressed understanding of these findings and this treatment plan and requested her medications to be picked up later today. I recommended that if her symptoms do not improve after 7 days of treatment please let us know.  Collier Salina, MD PGY-II Internal Medicine Resident 12/27/2016, 3:36 PM

## 2016-12-27 NOTE — Progress Notes (Signed)
Internal Medicine Clinic Attending  I saw and evaluated the patient.  I personally confirmed the key portions of the history and exam documented by Dr. Rice and I reviewed pertinent patient test results.  The assessment, diagnosis, and plan were formulated together and I agree with the documentation in the resident's note.  

## 2016-12-27 NOTE — Assessment & Plan Note (Signed)
Based on genitourinary exam the cervix appears pretty normal with a thin mildly odorous discharge appreciated in the vagina is suspicious for possible bacterial vaginosis. It is unclear that her current vaginal discharge would be a result of sexual contact. Speculum exam with swab for GC chlamydia as well as wet prep will be sent today. This typically been consistent in barrier contraception for this. I offered additional screening with HIV and RPR testing today which the patient declined.  Plan to follow-up swab results with call to the patient and will treat based on findings.

## 2016-12-27 NOTE — Addendum Note (Signed)
Addended by: Collier Salina on: 12/27/2016 03:37 PM   Modules accepted: Orders

## 2017-01-03 DIAGNOSIS — F3181 Bipolar II disorder: Secondary | ICD-10-CM | POA: Diagnosis not present

## 2017-01-16 ENCOUNTER — Telehealth: Payer: Self-pay

## 2017-01-16 DIAGNOSIS — F410 Panic disorder [episodic paroxysmal anxiety] without agoraphobia: Secondary | ICD-10-CM | POA: Diagnosis not present

## 2017-01-16 DIAGNOSIS — F3181 Bipolar II disorder: Secondary | ICD-10-CM | POA: Diagnosis not present

## 2017-01-16 NOTE — Progress Notes (Signed)
Spoke to Ms. Steinmeyer on the phone about her smoking cessation attempt currently.  At last visit she was down to three cigarettes a day and has remained at three cigarettes/day per our talk today.  We discussed trying to taper her down to two cigarettes/day within the next month.  She typically smokes one cigarette in the AM and two in the PM.  She is going to try and reduce to only one cigarette in the PM.  Discussed her weight gain due to smoking cessation and went over portion control tactics and increasing physical activity.  Patient still reports a cough but it has gotten better since her last visit.  Also reports having trouble sleeping, but feels better overall so not worried about the difficulty sleeping.    Will call back in a few weeks to determine progression.

## 2017-01-22 ENCOUNTER — Telehealth: Payer: Self-pay

## 2017-01-22 NOTE — Telephone Encounter (Signed)
Call to patient to follow up on psych referral. Patient reports she kept her psych appointment with [facility] and counseling and has follow up appointment in 3 weeks.

## 2017-01-29 ENCOUNTER — Ambulatory Visit (INDEPENDENT_AMBULATORY_CARE_PROVIDER_SITE_OTHER): Payer: Medicare Other | Admitting: Internal Medicine

## 2017-01-29 ENCOUNTER — Other Ambulatory Visit (HOSPITAL_COMMUNITY)
Admission: RE | Admit: 2017-01-29 | Discharge: 2017-01-29 | Disposition: A | Discharge status: Home / Self Care | Payer: Medicare Other | Source: Ambulatory Visit | Attending: Internal Medicine | Admitting: Internal Medicine

## 2017-01-29 VITALS — BP 128/79 | HR 75 | Temp 98.2°F | Wt 208.6 lb

## 2017-01-29 DIAGNOSIS — N898 Other specified noninflammatory disorders of vagina: Secondary | ICD-10-CM | POA: Diagnosis not present

## 2017-01-29 DIAGNOSIS — Z8742 Personal history of other diseases of the female genital tract: Secondary | ICD-10-CM

## 2017-01-29 DIAGNOSIS — F17210 Nicotine dependence, cigarettes, uncomplicated: Secondary | ICD-10-CM

## 2017-01-29 NOTE — Patient Instructions (Signed)
It was a pleasure to meet you today [name],   Someone from our office will call you with the results of your test from today.   Please schedule a follow up appointment in 3-6 months

## 2017-01-29 NOTE — Assessment & Plan Note (Addendum)
Pt presented to clinic 1/3 with concern for fishy smelling vaginal discharge. She has been with the same sexual partner for 3 years and has always used barrier protection. She felt that these symptoms were related to an encounter when the barrier had broken. Vaginal wet prep revealed Gardnerella and she completed a one week course of metronidazole. Her symptoms resolved completely after the metronidazole. Last week she developed non odorous brown vaginal discharge. She does not have any vaginal pain, discomfort, or itch. She has not had any new partners and continues to use condoms with the same partner. She had a total hysterectomy in 2004 for uterine fibroids so she will not need pap smear testing. She denies vaginal bleeding or dryness.   Vaginal exam showed thin white discharge without malodor and no lesions or changes in vaginal mucosa. May be related to recurrent bacterial vaginosis infection or a new sexually transmitted infection. Will perform wet prep today and decide on treatment based on these results.  - Performed GC/chlamydia and wet prep to test for trichomoniasis, gardnerella, and candida  Addendum: Wet prep and GC/chlamydia probe were negative. Vaginal discharge is less likely related to infection but patient should continue monitoring.

## 2017-01-29 NOTE — Progress Notes (Signed)
   CC: vaginal discharge   HPI: [name] is a 46 y.o. with past medical history as outlined below who presents to clinic for follow up of vaginal discharge.  Pt presented to clinic 1/3 with concern for fishy smelling vaginal discharge. She has been with the same sexual partner for 3 years and has always used barrier protection. She felt that these symptoms were related to an encounter when the barrier had broken. Vaginal wet prep revealed Gardnerella and she completed a one week course of metronidazole. Her symptoms resolved completely after the metronidazole. Last week she developed non odorous brown vaginal discharge. She does not have any vaginal pain, discomfort, or itch. She has not had any new partners and continues to use condoms with the same partner. She had a total hysterectomy in 2004 for uterine fibroids. She denies vaginal bleeding or dryness.   Please see problem list for status of the pt's chronic medical problems.  Past Medical History:  Diagnosis Date  . Anemia   . Anxiety   . Anxiety and depression   . Asthma   . Bipolar 1 disorder ([place])   . Depression   . GERD (gastroesophageal reflux disease)   . Heart murmur   . Hemorrhoids   . Hiatal hernia   . Internal hemorrhoids     Review of Systems:  Please see each problem below for a pertinent review of systems.  Physical Exam:  Vitals:   01/29/17 0834  BP: 128/79  Pulse: 75  Temp: 98.2 F (36.8 C)  TempSrc: Oral  SpO2: 100%  Weight: 208 lb 9.6 oz (94.6 kg)   Physical Exam  Constitutional: She appears well-developed and well-nourished. No distress.  Genitourinary:  Genitourinary Comments: No external lesions.  Vaginal canal is without lesions.  Thin white vaginal discharge is visualized during the exam. There is no appreciable odor.  She does not have a cervix.   Neurological: She is alert.  Skin: She is not diaphoretic.  Psychiatric: She has a normal mood and affect. Her behavior is normal.    Assessment & Plan:   See Encounters Tab for problem based charting.  Vaginal discharge  Vaginal exam showed thin white discharge without malodor and no lesions or changes in vaginal mucosa. May be related to recurrent bacterial vaginosis infection or a new sexually transmitted infection. Will perform wet prep today and decide on treatment based on these results.  - Performed GC/chlamydia and wet prep to test for trichomoniasis, gardnerella, and candida  Patient seen with Dr. Dareen Piano

## 2017-01-30 LAB — CERVICOVAGINAL ANCILLARY ONLY
Chlamydia: NEGATIVE
NEISSERIA GONORRHEA: NEGATIVE
WET PREP (BD AFFIRM): NEGATIVE

## 2017-01-31 ENCOUNTER — Encounter: Payer: Self-pay | Admitting: Internal Medicine

## 2017-01-31 ENCOUNTER — Ambulatory Visit: Payer: Medicare Other

## 2017-02-03 ENCOUNTER — Telehealth: Payer: Self-pay | Admitting: Pharmacist

## 2017-02-03 DIAGNOSIS — F17200 Nicotine dependence, unspecified, uncomplicated: Secondary | ICD-10-CM

## 2017-02-03 MED ORDER — NICOTINE 10 MG IN INHA: 1.0000 | RESPIRATORY_TRACT | 3 refills | Status: DC | PRN

## 2017-02-03 NOTE — Progress Notes (Signed)
Patient called requesting further support for smoking cessation. She states her friend recently passed away and she is back to smoking 1 pack per day. Per [facility] pharmacy, nicotrol inhaler $3 per month. Patient is agreeable to try to switch from nicotine gum to the nicotrol inhaler. Encouraged patient and provided her with information on the Quit[facility] and [facility] free tobacco cessation classes. Advised her to contact clinic if further concerns arise. Patient verbalized understanding.

## 2017-02-05 NOTE — Progress Notes (Signed)
Internal Medicine Clinic Attending  I saw and evaluated the patient.  I personally confirmed the key portions of the history and exam documented by Dr. Blum and I reviewed pertinent patient test results.  The assessment, diagnosis, and plan were formulated together and I agree with the documentation in the resident's note. 

## 2017-02-07 ENCOUNTER — Ambulatory Visit: Payer: Medicare Other | Admitting: Pharmacist

## 2017-02-12 DIAGNOSIS — F3181 Bipolar II disorder: Secondary | ICD-10-CM | POA: Diagnosis not present

## 2017-02-12 DIAGNOSIS — F410 Panic disorder [episodic paroxysmal anxiety] without agoraphobia: Secondary | ICD-10-CM | POA: Diagnosis not present

## 2017-02-14 ENCOUNTER — Encounter: Payer: Self-pay | Admitting: Pharmacist

## 2017-02-14 ENCOUNTER — Ambulatory Visit: Payer: Medicare Other | Admitting: Pharmacist

## 2017-02-14 DIAGNOSIS — Z719 Counseling, unspecified: Secondary | ICD-10-CM

## 2017-02-14 NOTE — Progress Notes (Signed)
S:  Patient met with me for assistance with tobacco cessation. Patient implemented smoking cessation plan in December 2017 with bupropion and nicotine gum. She was able to reduce from 1/2 pack per day to 2-3 cigarettes per day. Patient reports regression in her attempt to quit due to recent death of a loved one. She requested a replacement for the nicotine gum and therefore we switched patient to Nicotrol inhaler as approved by PCP.  A/P: Nicotrol inhaler was reviewed with the patient, including name, instructions, indication, goals of therapy, potential side effects, importance of adherence, and safe use. I encouraged patient to continue her attempt at smoking cessation and referred her to Quitline and [facility] smoking cessation class. She does report efficacy and tolerability of bupropion therapy.  Patient verbalized understanding by repeating back information and was advised to contact me if further medication-related questions arise. Patient was also provided an information handout.

## 2017-02-18 NOTE — Progress Notes (Signed)
Patient was contacted by Richard Miu, PharmD candidate. I agree with the assessment and plan of care documented.

## 2017-02-18 NOTE — Telephone Encounter (Signed)
Unable to reach.

## 2017-02-19 ENCOUNTER — Telehealth: Payer: Self-pay

## 2017-02-19 NOTE — Telephone Encounter (Signed)
No answer, no vmail 

## 2017-02-19 NOTE — Telephone Encounter (Signed)
Pt needs to speak with a nurse regarding Tetanus Shot. Please call back.

## 2017-02-21 NOTE — Telephone Encounter (Signed)
No vmail, no answer

## 2017-02-24 NOTE — Telephone Encounter (Signed)
No answer

## 2017-02-27 DIAGNOSIS — F3181 Bipolar II disorder: Secondary | ICD-10-CM | POA: Diagnosis not present

## 2017-02-27 DIAGNOSIS — F410 Panic disorder [episodic paroxysmal anxiety] without agoraphobia: Secondary | ICD-10-CM | POA: Diagnosis not present

## 2017-03-04 ENCOUNTER — Emergency Department (HOSPITAL_COMMUNITY)
Admission: EM | Admit: 2017-03-04 | Discharge: 2017-03-04 | Disposition: A | Discharge status: Home / Self Care | Payer: Medicare Other | Attending: Emergency Medicine | Admitting: Emergency Medicine

## 2017-03-04 ENCOUNTER — Encounter (HOSPITAL_COMMUNITY): Payer: Self-pay

## 2017-03-04 ENCOUNTER — Emergency Department (HOSPITAL_COMMUNITY): Payer: Medicare Other

## 2017-03-04 DIAGNOSIS — J45909 Unspecified asthma, uncomplicated: Secondary | ICD-10-CM | POA: Insufficient documentation

## 2017-03-04 DIAGNOSIS — K920 Hematemesis: Secondary | ICD-10-CM | POA: Diagnosis not present

## 2017-03-04 DIAGNOSIS — I10 Essential (primary) hypertension: Secondary | ICD-10-CM | POA: Insufficient documentation

## 2017-03-04 DIAGNOSIS — F17210 Nicotine dependence, cigarettes, uncomplicated: Secondary | ICD-10-CM | POA: Insufficient documentation

## 2017-03-04 DIAGNOSIS — K2970 Gastritis, unspecified, without bleeding: Secondary | ICD-10-CM | POA: Diagnosis not present

## 2017-03-04 DIAGNOSIS — R1110 Vomiting, unspecified: Secondary | ICD-10-CM | POA: Diagnosis not present

## 2017-03-04 DIAGNOSIS — R112 Nausea with vomiting, unspecified: Secondary | ICD-10-CM | POA: Diagnosis present

## 2017-03-04 DIAGNOSIS — R1013 Epigastric pain: Secondary | ICD-10-CM | POA: Diagnosis not present

## 2017-03-04 DIAGNOSIS — R197 Diarrhea, unspecified: Secondary | ICD-10-CM | POA: Diagnosis not present

## 2017-03-04 DIAGNOSIS — R109 Unspecified abdominal pain: Secondary | ICD-10-CM | POA: Diagnosis not present

## 2017-03-04 DIAGNOSIS — R110 Nausea: Secondary | ICD-10-CM | POA: Diagnosis not present

## 2017-03-04 DIAGNOSIS — K529 Noninfective gastroenteritis and colitis, unspecified: Secondary | ICD-10-CM

## 2017-03-04 LAB — COMPREHENSIVE METABOLIC PANEL
ALK PHOS: 67 U/L (ref 38–126)
ALT: 20 U/L (ref 14–54)
ANION GAP: 8 (ref 5–15)
AST: 25 U/L (ref 15–41)
Albumin: 4.5 g/dL (ref 3.5–5.0)
BILIRUBIN TOTAL: 0.6 mg/dL (ref 0.3–1.2)
BUN: 15 mg/dL (ref 6–20)
CALCIUM: 9.6 mg/dL (ref 8.9–10.3)
CO2: 28 mmol/L (ref 22–32)
Chloride: 102 mmol/L (ref 101–111)
Creatinine, Ser: 0.74 mg/dL (ref 0.44–1.00)
GFR calc non Af Amer: >60 mL/min (ref >60)
GLUCOSE: 101 mg/dL — AB (ref 65–99)
Potassium: 3.6 mmol/L (ref 3.5–5.1)
Sodium: 138 mmol/L (ref 135–145)
Total Protein: 8.3 g/dL — ABNORMAL HIGH (ref 6.5–8.1)

## 2017-03-04 LAB — ETHANOL

## 2017-03-04 LAB — URINALYSIS, ROUTINE W REFLEX MICROSCOPIC
Bilirubin Urine: NEGATIVE
Glucose, UA: NEGATIVE mg/dL
Hgb urine dipstick: NEGATIVE
Ketones, ur: NEGATIVE mg/dL
LEUKOCYTES UA: NEGATIVE
Nitrite: NEGATIVE
PROTEIN: NEGATIVE mg/dL
SPECIFIC GRAVITY, URINE: 1.018 (ref 1.005–1.030)
pH: 6 (ref 5.0–8.0)

## 2017-03-04 LAB — CBC WITH DIFFERENTIAL/PLATELET
Basophils Absolute: 0 10*3/uL (ref 0.0–0.1)
Basophils Relative: 0 %
Eosinophils Absolute: 0 10*3/uL (ref 0.0–0.7)
Eosinophils Relative: 0 %
HEMATOCRIT: 49.6 % — AB (ref 36.0–46.0)
HEMOGLOBIN: 17.4 g/dL — AB (ref 12.0–15.0)
Lymphocytes Relative: 8 %
Lymphs Abs: 1 10*3/uL (ref 0.7–4.0)
MCH: 29.1 pg (ref 26.0–34.0)
MCHC: 35.1 g/dL (ref 30.0–36.0)
MCV: 83.1 fL (ref 78.0–100.0)
MONO ABS: 1.1 10*3/uL — AB (ref 0.1–1.0)
MONOS PCT: 8 %
NEUTROS ABS: 11.5 10*3/uL — AB (ref 1.7–7.7)
NEUTROS PCT: 84 %
Platelets: 215 10*3/uL (ref 150–400)
RBC: 5.97 MIL/uL — ABNORMAL HIGH (ref 3.87–5.11)
RDW: 14.3 % (ref 11.5–15.5)
WBC: 13.7 10*3/uL — ABNORMAL HIGH (ref 4.0–10.5)

## 2017-03-04 LAB — POC URINE PREG, ED
PREG TEST UR: NEGATIVE
Preg Test, Ur: NEGATIVE

## 2017-03-04 LAB — I-STAT CG4 LACTIC ACID, ED
LACTIC ACID, VENOUS: 1.98 mmol/L — AB (ref 0.5–1.9)
Lactic Acid, Venous: 2.49 mmol/L (ref 0.5–1.9)

## 2017-03-04 LAB — I-STAT TROPONIN, ED: TROPONIN I, POC: 0.01 ng/mL (ref 0.00–0.08)

## 2017-03-04 LAB — LIPASE, BLOOD: Lipase: 15 U/L (ref 11–51)

## 2017-03-04 MED ORDER — PANTOPRAZOLE SODIUM 40 MG IV SOLR
40.0000 mg | Freq: Once | INTRAVENOUS | Status: AC
  Administered 2017-03-04: 40 mg via INTRAVENOUS
  Filled 2017-03-04: qty 40

## 2017-03-04 MED ORDER — SODIUM CHLORIDE 0.9 % IV BOLUS (SEPSIS)
1000.0000 mL | Freq: Once | INTRAVENOUS | Status: AC
  Administered 2017-03-04: 1000 mL via INTRAVENOUS

## 2017-03-04 MED ORDER — ONDANSETRON HCL 4 MG/2ML IJ SOLN
4.0000 mg | Freq: Once | INTRAMUSCULAR | Status: AC
  Administered 2017-03-04: 4 mg via INTRAVENOUS
  Filled 2017-03-04: qty 2

## 2017-03-04 MED ORDER — AMOXICILLIN-POT CLAVULANATE 875-125 MG PO TABS: 1.0000 | ORAL_TABLET | Freq: Two times a day (BID) | ORAL | 0 refills | Status: DC

## 2017-03-04 MED ORDER — OXYCODONE-ACETAMINOPHEN 5-325 MG PO TABS: 1.0000 | ORAL_TABLET | ORAL | 0 refills | Status: DC | PRN

## 2017-03-04 MED ORDER — FENTANYL CITRATE (PF) 100 MCG/2ML IJ SOLN
50.0000 ug | Freq: Once | INTRAMUSCULAR | Status: AC
  Administered 2017-03-04: 50 ug via INTRAVENOUS
  Filled 2017-03-04: qty 2

## 2017-03-04 MED ORDER — HYDROMORPHONE HCL 1 MG/ML IJ SOLN
1.0000 mg | Freq: Once | INTRAMUSCULAR | Status: AC
  Administered 2017-03-04: 1 mg via INTRAVENOUS
  Filled 2017-03-04: qty 1

## 2017-03-04 MED ORDER — IOPAMIDOL (ISOVUE-300) INJECTION 61%
INTRAVENOUS | Status: AC
  Administered 2017-03-04: 30 mL via ORAL
  Filled 2017-03-04: qty 30

## 2017-03-04 MED ORDER — ONDANSETRON 4 MG PO TBDP: 4.0000 mg | ORAL_TABLET | Freq: Three times a day (TID) | ORAL | 0 refills | Status: DC | PRN

## 2017-03-04 MED ORDER — IOPAMIDOL (ISOVUE-300) INJECTION 61%
100.0000 mL | Freq: Once | INTRAVENOUS | Status: AC | PRN
  Administered 2017-03-04: 100 mL via INTRAVENOUS

## 2017-03-04 MED ORDER — IOPAMIDOL (ISOVUE-300) INJECTION 61%
30.0000 mL | Freq: Once | INTRAVENOUS | Status: AC | PRN
  Administered 2017-03-04: 30 mL via ORAL

## 2017-03-04 MED ORDER — IOPAMIDOL (ISOVUE-300) INJECTION 61%
INTRAVENOUS | Status: AC
  Filled 2017-03-04: qty 100

## 2017-03-04 NOTE — Discharge Instructions (Signed)
Take medications as prescribed.  Return if your symptoms worsen.  You CT scan results are below.  You can discuss the incidental findings with your doctor, but none appear to need follow-up.  There is no bowel wall thickening or bowel obstruction. There is more fluid in the right colon than is generally seen. A degree of nonspecific colitis cannot be entirely excluded. There is no surrounding mesenteric thickening.  Appendix upper normal in size with oral contrast seen throughout the appendix. No periappendiceal region inflammation.  No renal or ureteral calculus.  No hydronephrosis.  Stable small right renal mass which in 2014 was evaluated by MR and found to represent a mildly complex cyst. No new renal lesions.  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  Mild aortic atherosclerosis.  No aneurysm.

## 2017-03-04 NOTE — ED Notes (Signed)
Bed: WA04 Expected date:  Expected time:  Means of arrival:  Comments: 46 yo F/ Vomiting

## 2017-03-04 NOTE — ED Notes (Signed)
Patient transported to CT 

## 2017-03-04 NOTE — ED Notes (Signed)
Discharge instructions, follow up care, and rx x3 reviewed with patient. Patient verbalized understanding. 

## 2017-03-04 NOTE — ED Provider Notes (Signed)
Patient signed out to me at shift change.  Patient has had n/v/d and abdominal pain since 230a this morning.  She reports having seen some blood in her vomit, but is unable to adequately quantify the amount.  No hematemesis in the ED.    H/H are stable.  Lactate is down trending.  Mild leukocytosis.   CT shows evidence of possible colitis.    Will treat the colitis.  Medication interaction with cipro, will give Augmentin.  Patient instructed to return for:  New or worsening symptoms, including, increased abdominal pain, especially pain that localizes to one side, bloody vomit, bloody diarrhea, fever >101, and intractable vomiting.     Montine Circle, PA-C 03/04/17 4136    Merryl Hacker, MD 03/05/17 [phone]

## 2017-03-04 NOTE — ED Provider Notes (Signed)
[facility] DEPT Provider Note   CSN: [id number] Arrival date & time: 03/04/17  5974     History   Chief Complaint Chief Complaint  Patient presents with  . Nausea  . Emesis  . Abdominal Pain    HPI Jasmine Jackson is a 46 y.o. female with a hx of Hiatal hernia, GERD, hemorrhoids, depression, anxiety, asthma presents to the Emergency Department complaining of gradual, persistent, progressively worsening gastric abdominal pain onset 2:30 this morning. Patient reports she's had 6 episodes of emesis, some of them moderately bloody. She denies blood clots. Patient denies history of abdominal surgeries. She denies alcohol usage or anticoagulate usage. No aggravating or alleviating factors. Patient denies recent travel, recent antibiotics, diarrhea. She denies black tarry stools. She does state some bright red like her rectum secondary to her hemorrhoids in the last few days. She denies sick contacts.  The history is provided by the patient and medical records. No language interpreter was used.    Past Medical History:  Diagnosis Date  . Anemia   . Anxiety   . Anxiety and depression   . Asthma   . Bipolar 1 disorder ([place])   . Depression   . GERD (gastroesophageal reflux disease)   . Heart murmur   . Hemorrhoids   . Hiatal hernia   . Internal hemorrhoids     Patient Active Problem List   Diagnosis Date Noted  . Vaginal discharge 12/25/2016  . Hot flashes 11/27/2016  . Tobacco use  10/18/2016  . Panic attacks 09/09/2016  . Health care maintenance 01/11/2016  . Low back pain 12/02/2015  . Hypertension 04/24/2015  . Muscle spasm 04/24/2015  . Bipolar disorder ([place]) 04/16/2014  . History of gout 04/01/2014  . Internal hemorrhoids without complication 16/38/4536  . Irritable bowel syndrome 12/08/2013  . Esophageal reflux 12/08/2013    Past Surgical History:  Procedure Laterality Date  . ABDOMINAL HYSTERECTOMY     fibroids  . HEMORRHOID SURGERY    . INGUINAL HERNIA  REPAIR    . TONSILLECTOMY    . TUBAL LIGATION      OB History    No data available       Home Medications    Prior to Admission medications   Medication Sig Start Date End Date Taking? Authorizing Provider  albuterol (PROVENTIL HFA;VENTOLIN HFA) 108 (90 Base) MCG/ACT inhaler Inhale 2 puffs into the lungs every 6 (six) hours as needed for wheezing or shortness of breath. 10/18/16  Yes Jule Ser, DO  buPROPion (WELLBUTRIN SR) 150 MG 12 hr tablet 150 mg oral daily for 1 week, then increase to 150 mg oral twice daily. Patient taking differently: Take 150 mg by mouth 2 (two) times daily.  11/27/16  Yes Axel Filler, MD  busPIRone (BUSPAR) 15 MG tablet Take 30 mg by mouth 2 (two) times daily.   Yes Historical Provider, MD  colchicine (COLCRYS) 0.6 MG tablet Take 1 tablet (0.6 mg total) by mouth daily. Patient taking differently: Take 0.6 mg by mouth daily as needed (gout.).  11/29/15  Yes Hoyt Koch, MD  diclofenac (VOLTAREN) 75 MG EC tablet Take 1 tablet (75 mg total) by mouth 2 (two) times daily as needed for mild pain. 10/18/16  Yes Jule Ser, DO  dicyclomine (BENTYL) 10 MG capsule Take 1 capsule (10 mg total) by mouth 4 (four) times daily -  before meals and at bedtime. 03/05/16  Yes Lucious Groves, DO  FLUoxetine (PROZAC) 40 MG capsule Take 80 mg  by mouth every morning.    Yes Historical Provider, MD  gabapentin (NEURONTIN) 800 MG tablet Take 800 mg by mouth 3 (three) times daily. 04/01/16  Yes Historical Provider, MD  hydrochlorothiazide (HYDRODIURIL) 25 MG tablet Take 1 tablet (25 mg total) by mouth daily. 10/18/16  Yes Jule Ser, DO  hydrOXYzine (ATARAX/VISTARIL) 50 MG tablet Take 50 mg by mouth 3 (three) times daily. 02/13/17  Yes Historical Provider, MD  hydrOXYzine (VISTARIL) 25 MG capsule Take 25 mg by mouth 3 (three) times daily as needed for itching.  08/31/15  Yes Historical Provider, MD  nicotine (NICOTROL) 10 MG inhaler Inhale 1 Cartridge (1  continuous puffing total) into the lungs as needed for smoking cessation. At least 6 cartridges/day for first 3-6 wks 02/03/17  Yes Jule Ser, DO  omeprazole (PRILOSEC) 40 MG capsule Take 1 capsule (40 mg total) by mouth daily. 10/18/16  Yes Jule Ser, DO  SEROQUEL XR 400 MG 24 hr tablet Take 800 mg by mouth at bedtime.  08/31/15  Yes Historical Provider, MD  tiZANidine (ZANAFLEX) 2 MG tablet Take 1 tablet (2 mg total) by mouth every 8 (eight) hours as needed for muscle spasms. 10/18/16  Yes Jule Ser, DO  vitamin E 400 UNIT capsule Take 1 capsule (400 Units total) by mouth daily. 11/27/16 11/27/17 Yes Norman Herrlich, MD  metroNIDAZOLE (FLAGYL) 500 MG tablet Take 1 tablet (500 mg total) by mouth 2 (two) times daily. Patient not taking: Reported on 03/04/2017 12/27/16   Collier Salina, MD    Family History Family History  Problem Relation Age of Onset  . Breast cancer Maternal Aunt     x4  . Stomach cancer Maternal Aunt   . Prostate cancer Maternal Uncle     x2  . Breast cancer Maternal Grandmother   . Celiac disease Mother   . Colon cancer Neg Hx   . Esophageal cancer Neg Hx   . Rectal cancer Neg Hx     Social History Social History  Substance Use Topics  . Smoking status: Current Every Day Smoker    Packs/day: 0.50    Types: Cigarettes  . Smokeless tobacco: Never Used     Comment: 2 cigs per day  . Alcohol use No     Allergies   Asa [aspirin] and Morphine and related   Review of Systems Review of Systems  Constitutional: Negative for chills and fever.  Gastrointestinal: Positive for abdominal pain, nausea and vomiting.  All other systems reviewed and are negative.    Physical Exam Updated Vital Signs BP 134/81 (BP Location: Left Arm)   Pulse 90   Temp 97.5 F (36.4 C) (Oral)   Resp 17   SpO2 100%   Physical Exam  Constitutional: She appears well-developed and well-nourished. No distress.  Awake, alert, nontoxic appearance  HENT:  Head:  Normocephalic and atraumatic.  Mouth/Throat: Oropharynx is clear and moist. No oropharyngeal exudate.  Eyes: Conjunctivae are normal. No scleral icterus.  Neck: Normal range of motion. Neck supple.  Cardiovascular: Normal rate, regular rhythm and intact distal pulses.   Pulmonary/Chest: Effort normal and breath sounds normal. No respiratory distress. She has no wheezes.  Equal chest expansion  Abdominal: Soft. Bowel sounds are normal. She exhibits no distension and no mass. There is tenderness in the epigastric area and periumbilical area. There is no rebound and no guarding.  Musculoskeletal: Normal range of motion. She exhibits no edema.  Neurological: She is alert.  Speech is clear and goal oriented  Moves extremities without ataxia  Skin: Skin is warm and dry. She is not diaphoretic.  Psychiatric: She has a normal mood and affect.  Nursing note and vitals reviewed.    ED Treatments / Results  Labs (all labs ordered are listed, but only abnormal results are displayed) Labs Reviewed  CBC WITH DIFFERENTIAL/PLATELET - Abnormal; Notable for the following:       Result Value   WBC 13.7 (*)    RBC 5.97 (*)    Hemoglobin 17.4 (*)    HCT 49.6 (*)    Neutro Abs 11.5 (*)    Monocytes Absolute 1.1 (*)    All other components within normal limits  I-STAT CG4 LACTIC ACID, ED - Abnormal; Notable for the following:    Lactic Acid, Venous 2.49 (*)    All other components within normal limits  CULTURE, BLOOD (ROUTINE X 2)  CULTURE, BLOOD (ROUTINE X 2)  URINALYSIS, ROUTINE W REFLEX MICROSCOPIC  COMPREHENSIVE METABOLIC PANEL  LIPASE, BLOOD  ETHANOL  POC URINE PREG, ED  POC OCCULT BLOOD, ED  POC URINE PREG, ED  I-STAT TROPOININ, ED   Procedures Procedures (including critical care time)  Medications Ordered in ED Medications  pantoprazole (PROTONIX) injection 40 mg (not administered)  ondansetron (ZOFRAN) injection 4 mg (4 mg Intravenous Given 03/04/17 0630)  fentaNYL (SUBLIMAZE)  injection 50 mcg (50 mcg Intravenous Given 03/04/17 0630)  iopamidol (ISOVUE-300) 61 % injection 30 mL (30 mLs Oral Contrast Given 03/04/17 0638)  sodium chloride 0.9 % bolus 1,000 mL (1,000 mLs Intravenous New Bag/Given 03/04/17 0651)     Initial Impression / Assessment and Plan / ED Course  I have reviewed the triage vital signs and the nursing notes.  Pertinent labs & imaging results that were available during my care of the patient were reviewed by me and considered in my medical decision making (see chart for details).     Patient presents with epigastric abdominal pain, hematemesis. Patient with tenderness on exam. Patient given Protonix. Elevated lactic acid. Afebrile. Labs CT pending.  7:01 AM At shift change, care transferred to [employer], PA-C.  He will follow labs, CT, reassess and determine disposition.    Final Clinical Impressions(s) / ED Diagnoses   Final diagnoses:  Hematemesis with nausea  Epigastric abdominal pain    New Prescriptions New Prescriptions   No medications on file     Abigail Butts, PA-C 03/04/17 0703    Merryl Hacker, MD 03/05/17 [phone]

## 2017-03-04 NOTE — ED Notes (Signed)
ED Provider at bedside. 

## 2017-03-04 NOTE — ED Triage Notes (Signed)
Pt brought in via EMS c/o generalized abdominal pain  with vomiting x 4 that pt reports was bloody.  Pt does report having diarrhea x 1 episode. Pt was given 4 mg of Zofran.

## 2017-03-04 NOTE — ED Notes (Signed)
Pt states that she was sleeping and was awoken by abdominal pain and nausea.

## 2017-03-06 ENCOUNTER — Ambulatory Visit (INDEPENDENT_AMBULATORY_CARE_PROVIDER_SITE_OTHER): Payer: Medicare Other | Admitting: Internal Medicine

## 2017-03-06 DIAGNOSIS — F17210 Nicotine dependence, cigarettes, uncomplicated: Secondary | ICD-10-CM

## 2017-03-06 DIAGNOSIS — K529 Noninfective gastroenteritis and colitis, unspecified: Secondary | ICD-10-CM | POA: Insufficient documentation

## 2017-03-06 DIAGNOSIS — K648 Other hemorrhoids: Secondary | ICD-10-CM

## 2017-03-06 MED ORDER — ACETAMINOPHEN 500 MG PO TABS: 1000.0000 mg | ORAL_TABLET | Freq: Three times a day (TID) | ORAL | 2 refills | Status: AC | PRN

## 2017-03-06 NOTE — Progress Notes (Signed)
   CC:abd pain  HPI:  Ms.Anshi R Rexroad is a 46 y.o. with past medical history as outlined below who presents to clinic for abdominal pain. Please see problem list for further details.  Past Medical History:  Diagnosis Date  . Anemia   . Anxiety   . Anxiety and depression   . Asthma   . Bipolar 1 disorder ([place])   . Depression   . GERD (gastroesophageal reflux disease)   . Heart murmur   . Hemorrhoids   . Hiatal hernia   . Internal hemorrhoids     Review of Systems:  Denies current nausea, vomiting, fevers. Positive for bright red blood per rectum that is normal for her, diarrhea, and abdominal pain. Physical Exam:  Vitals:   03/06/17 1411  BP: (!) 145/83  Pulse: 70  Temp: 98.3 F (36.8 C)  TempSrc: Oral  SpO2: 100%  Weight: 206 lb 6.4 oz (93.6 kg)  Height: 5\' 5"  (1.651 m)   Physical Exam  Constitutional: oriented to person, place, and time. appears well-developed and well-nourished. No distress.  HENT:  Head: Normocephalic and atraumatic.  Nose: Nose normal.  Cardiovascular: Normal rate, regular rhythm and normal heart sounds.  Exam reveals no gallop and no friction rub.   No murmur heard. Abdominal: Soft. Bowel sounds are normal.  exhibits no distension. There is no tenderness. There is no rebound and no guarding.  Neurological: alert and oriented to person, place, and time.  Skin: Skin is warm and dry. No rash noted.  not diaphoretic. No erythema. No pallor.   Assessment & Plan:   See Encounters Tab for problem based charting.  Patient discussed with Dr. Dareen Piano

## 2017-03-06 NOTE — Assessment & Plan Note (Signed)
Assessment: Patient was seen in the emergency department on March 13, 2 days ago with abdominal pain and diarrhea. Her CBC was hemoconcentrated as hemoglobin was 17.4 in the setting of diarrhea, her white blood cell count was 13.7. She had a CT of her abdomen that showed a moderate stool and gas. There was no bowel wall thickening colitis cannot be excluded. She was discharged from the with Augmentin twice a day. She presents with a for follow-up because she does not want to take his habit for her abdominal pain. She states that she was previously on tramadol in the past which helped with her pain. She currently denies any fever or inability to tolerate by mouth. She had 2 bowel movements today both for salt and not watery. Prior to the ED presentation and she denies any abx use. She had a colonoscopy in 2014 that was + for internal hemorrhoids and she has BRBPR on occasion that is normal for her. Denies increased bleeding, dizziness, and lightedness.   Plan: Advised patient that she can take Tylenol 1000 mg 3 times a day as needed for abdominal pain. Return precautions given

## 2017-03-06 NOTE — Patient Instructions (Signed)
You can take tylenol 1000mg  up to three times a day as needed for stomach pain. Do not take more than 3000mg  per day.

## 2017-03-09 LAB — CULTURE, BLOOD (ROUTINE X 2)
Culture: NO GROWTH
Culture: NO GROWTH

## 2017-03-12 NOTE — Progress Notes (Signed)
Internal Medicine Clinic Attending  Case discussed with Dr. Truong at the time of the visit.  We reviewed the resident's history and exam and pertinent patient test results.  I agree with the assessment, diagnosis, and plan of care documented in the resident's note.  

## 2017-03-13 ENCOUNTER — Ambulatory Visit (INDEPENDENT_AMBULATORY_CARE_PROVIDER_SITE_OTHER): Payer: Medicare Other | Admitting: Internal Medicine

## 2017-03-13 ENCOUNTER — Encounter: Payer: Self-pay | Admitting: Internal Medicine

## 2017-03-13 DIAGNOSIS — I10 Essential (primary) hypertension: Secondary | ICD-10-CM

## 2017-03-13 DIAGNOSIS — Z79899 Other long term (current) drug therapy: Secondary | ICD-10-CM | POA: Diagnosis not present

## 2017-03-13 DIAGNOSIS — F17210 Nicotine dependence, cigarettes, uncomplicated: Secondary | ICD-10-CM | POA: Diagnosis not present

## 2017-03-13 DIAGNOSIS — F17200 Nicotine dependence, unspecified, uncomplicated: Secondary | ICD-10-CM

## 2017-03-13 DIAGNOSIS — F319 Bipolar disorder, unspecified: Secondary | ICD-10-CM | POA: Diagnosis not present

## 2017-03-13 DIAGNOSIS — K529 Noninfective gastroenteritis and colitis, unspecified: Secondary | ICD-10-CM | POA: Diagnosis not present

## 2017-03-13 MED ORDER — BUPROPION HCL ER (SR) 150 MG PO TB12: 150.0000 mg | ORAL_TABLET | Freq: Two times a day (BID) | ORAL | 1 refills | Status: DC

## 2017-03-13 NOTE — Progress Notes (Signed)
Medicine attending: Medical history, presenting problems, physical findings, and medications, reviewed with resident physician Dr Andrew Wallace on the day of the patient visit and I concur with his evaluation and management plan. 

## 2017-03-13 NOTE — Assessment & Plan Note (Signed)
BP Readings from Last 3 Encounters:  03/13/17 (!) 119/56  03/06/17 (!) 145/83  03/04/17 135/83   A:  Jasmine Jackson BP is well controlled on single class regimen of HCTZ.  She denies symptoms of chest pain, Headaches, blurry vision, or lightheadedness.  P: - Continue HCTZ 25mg  daily - Continue low sodium diet as she is able

## 2017-03-13 NOTE — Assessment & Plan Note (Signed)
A: She reports being seen by [facility] after leaving [facility] recently.  She is very happy with her new mental health provider and counselors.  They are managing her medication needs.  P: - continue follow up with psychiatry.

## 2017-03-13 NOTE — Progress Notes (Signed)
   CC: here for HTN f/u  HPI:  [name] is a 46 y.o. woman with a past medical history listed below here today for follow up of her HTN.  For details of today's visit and the status of her chronic medical issues please refer to the assessment and plan.   Past Medical History:  Diagnosis Date  . Anemia   . Anxiety   . Anxiety and depression   . Asthma   . Bipolar 1 disorder ([place])   . Depression   . GERD (gastroesophageal reflux disease)   . Heart murmur   . Hemorrhoids   . Hiatal hernia   . Hypertension   . Internal hemorrhoids     Review of Systems:  Please see pertinent ROS reviewed in HPI and problem based charting.   Physical Exam:  Vitals:   03/13/17 0928  BP: (!) 119/56  Pulse: 80  Temp: 98.4 F (36.9 C)  TempSrc: Oral  SpO2: 100%  Weight: 205 lb 6.4 oz (93.2 kg)  Height: 5\' 5"  (1.651 m)   Physical Exam  Constitutional: She is oriented to person, place, and time and well-developed, well-nourished, and in no distress.  Cardiovascular: Normal rate and regular rhythm.   Pulmonary/Chest: Effort normal.  Neurological: She is alert and oriented to person, place, and time.  Skin: Skin is warm and dry.  Psychiatric: Mood and affect normal.     Assessment & Plan:   See Encounters Tab for problem based charting.  Patient discussed with Dr. Beryle Beams.  Hypertension BP Readings from Last 3 Encounters:  03/13/17 (!) 119/56  03/06/17 (!) 145/83  03/04/17 135/83   A:  Jasmine Jackson BP is well controlled on single class regimen of HCTZ.  She denies symptoms of chest pain, Headaches, blurry vision, or lightheadedness.  P: - Continue HCTZ 25mg  daily - Continue low sodium diet as she is able  Colitis A: She reports resolution of her symptoms and has completed course of antibiotics.  Reports Tylenol did not help with her symptoms but that taking her NSAIDs relieved her pain.    P: - follow up as needed  Bipolar disorder ([place]) A: She reports  being seen by [facility] after leaving [facility] recently.  She is very happy with her new mental health provider and counselors.  They are managing her medication needs.  P: - continue follow up with psychiatry.  Tobacco use  A: Patient reports being down to about 3 cigarettes per day and states the NRT is helping significantly.  P: - congratulated on progress, continue to work towards cessation.

## 2017-03-13 NOTE — Assessment & Plan Note (Signed)
A: She reports resolution of her symptoms and has completed course of antibiotics.  Reports Tylenol did not help with her symptoms but that taking her NSAIDs relieved her pain.    P: - follow up as needed

## 2017-03-13 NOTE — Patient Instructions (Signed)
Thank you for coming to see me today. It was a pleasure. Today we talked about:   Blood pressure: it is under control  I am glad everything worked out with your new counselor at [facility] and they have you on the correct medications.  Please follow-up with me in 6 months.  If you have any questions or concerns, please do not hesitate to call the office at [phone].  Take Care,   Jule Ser, DO

## 2017-03-13 NOTE — Assessment & Plan Note (Signed)
A: Patient reports being down to about 3 cigarettes per day and states the NRT is helping significantly.  P: - congratulated on progress, continue to work towards cessation.

## 2017-04-04 ENCOUNTER — Other Ambulatory Visit: Payer: Self-pay | Admitting: Internal Medicine

## 2017-04-04 DIAGNOSIS — F3181 Bipolar II disorder: Secondary | ICD-10-CM | POA: Diagnosis not present

## 2017-04-04 DIAGNOSIS — F410 Panic disorder [episodic paroxysmal anxiety] without agoraphobia: Secondary | ICD-10-CM | POA: Diagnosis not present

## 2017-04-08 DIAGNOSIS — F410 Panic disorder [episodic paroxysmal anxiety] without agoraphobia: Secondary | ICD-10-CM | POA: Diagnosis not present

## 2017-04-08 DIAGNOSIS — F3181 Bipolar II disorder: Secondary | ICD-10-CM | POA: Diagnosis not present

## 2017-04-18 ENCOUNTER — Telehealth: Payer: Self-pay | Admitting: Internal Medicine

## 2017-04-18 ENCOUNTER — Telehealth: Payer: Self-pay

## 2017-04-18 MED ORDER — DICYCLOMINE HCL 10 MG PO CAPS: 10.0000 mg | ORAL_CAPSULE | Freq: Three times a day (TID) | ORAL | 0 refills | Status: DC

## 2017-04-18 MED ORDER — DICLOFENAC SODIUM 1 % TD GEL: 2.0000 g | Freq: Four times a day (QID) | TRANSDERMAL | 0 refills | Status: DC

## 2017-04-18 NOTE — Telephone Encounter (Signed)
Medications (Voltaren Gel) inadvertently refilled by me. I am not her PCP. I have canceled these refills inc case there is a reason for her not to be using them that I am unaware of. Her PCP is Dr. Juleen China. I will email the staff and cc Dr. Juleen China so he knows she is requesting these medications.

## 2017-04-18 NOTE — Telephone Encounter (Signed)
The patient is not mine. Dr. Juleen China is her PCP. Please forward to him.  Thanks, Jeneen Rinks

## 2017-04-18 NOTE — Telephone Encounter (Signed)
dicyclomine (BENTYL) 10 MG capsule, refill request @ [facility] on [address].

## 2017-04-18 NOTE — Telephone Encounter (Signed)
As an add on to the previous note I also cancelled the Bentyl that I inadvertently refilled. I have sent a message with the original prescription requests to the send and requested it be sent to Dr. Juleen China.

## 2017-04-18 NOTE — Telephone Encounter (Signed)
Pt want the voltaren in gel instead the tablet. Please call pt back.

## 2017-04-18 NOTE — Telephone Encounter (Signed)
Clarified request w/ pt, states she would like to try the gel, her grmother uses it and swears by it Also would like bentyl refilled

## 2017-04-25 ENCOUNTER — Ambulatory Visit: Payer: Medicare Other | Admitting: Pharmacist

## 2017-04-26 ENCOUNTER — Encounter: Payer: Self-pay | Admitting: Internal Medicine

## 2017-04-26 ENCOUNTER — Telehealth: Payer: Self-pay | Admitting: Internal Medicine

## 2017-04-26 MED ORDER — SENNA 8.6 MG PO TABS: 2.0000 | ORAL_TABLET | Freq: Every day | ORAL | 0 refills | Status: DC

## 2017-04-26 MED ORDER — POLYETHYLENE GLYCOL 3350 17 G PO PACK: 17.0000 g | PACK | Freq: Every day | ORAL | 0 refills | Status: DC

## 2017-04-26 MED ORDER — ALUM & MAG HYDROXIDE-SIMETH 200-200-20 MG/5ML PO SUSP: 30.0000 mL | Freq: Four times a day (QID) | ORAL | 0 refills | Status: DC | PRN

## 2017-04-26 NOTE — Telephone Encounter (Signed)
   Reason for call:   I received a call from [name] at 224  PM indicating she has increasing abdominal and chest pain with swallowing plus 4 days of constipation.   Pertinent Data:   She denies any vomiting, diarrhea, fever, or chills  She is tolerating solid and liquid diet but with discomfort  Her pain is mostly epigastric that worsens with eating and is relieved by burping and passing flatus.  This problem is similar to her ED visit in March with nausea and vomiting but is less severe.   Assessment / Plan / Recommendations:   I suspect she has abdominal pain related to constipation at this time. She has a history of IBS with occasional episodes of constipation or diarrhea. There are no systemic symptoms to suggest an infection. She is not at severe risk of dehydration while tolerating oral intake.  I recommend starting miralax 1 tablet PO daily and senna x2 tablets PO daily until constipation improves. I recommended Maalox as needed for stomach soreness in the meantime. I recommended she following a full liquid diet for the next day or two and see if symptoms improve with these supportive measures.  I recommended she call back or have evaluation at ED or urgent care if fevers or more severe abdominal pain develops despite these interventions.  As always, pt is advised that if symptoms worsen or new symptoms arise, they should go to an urgent care facility or to to ER for further evaluation.   Collier Salina, MD   04/26/2017, 2:53 PM

## 2017-05-01 ENCOUNTER — Other Ambulatory Visit: Payer: Self-pay | Admitting: Internal Medicine

## 2017-05-01 DIAGNOSIS — F3181 Bipolar II disorder: Secondary | ICD-10-CM | POA: Diagnosis not present

## 2017-05-01 DIAGNOSIS — K582 Mixed irritable bowel syndrome: Secondary | ICD-10-CM

## 2017-05-01 DIAGNOSIS — F410 Panic disorder [episodic paroxysmal anxiety] without agoraphobia: Secondary | ICD-10-CM | POA: Diagnosis not present

## 2017-05-01 DIAGNOSIS — M545 Low back pain, unspecified: Secondary | ICD-10-CM

## 2017-05-01 MED ORDER — DICYCLOMINE HCL 10 MG PO CAPS: 10.0000 mg | ORAL_CAPSULE | Freq: Three times a day (TID) | ORAL | 11 refills | Status: DC

## 2017-05-01 MED ORDER — DICLOFENAC SODIUM 1 % TD GEL: 2.0000 g | Freq: Four times a day (QID) | TRANSDERMAL | 1 refills | Status: DC

## 2017-05-01 NOTE — Telephone Encounter (Signed)
Received bentyl refill request from [facility].  Rx was sent to [place] on 05/01/2017.  Call made to pt to confirm which pharmacy she is currently using.  Will await return call from patient.Despina Hidden Cassady5/10/201812:05 PM

## 2017-05-01 NOTE — Telephone Encounter (Signed)
Refill sent for her Bentyl and voltaren gel.  Thanks.

## 2017-05-06 ENCOUNTER — Other Ambulatory Visit: Payer: Self-pay | Admitting: Internal Medicine

## 2017-05-06 DIAGNOSIS — K582 Mixed irritable bowel syndrome: Secondary | ICD-10-CM

## 2017-05-06 MED ORDER — DICYCLOMINE HCL 10 MG PO CAPS: 10.0000 mg | ORAL_CAPSULE | Freq: Three times a day (TID) | ORAL | 11 refills | Status: DC

## 2017-05-06 NOTE — Telephone Encounter (Signed)
dicyclomine (BENTYL) 10 MG capsule, refill request @ [facility] on [address].

## 2017-05-08 DIAGNOSIS — F3181 Bipolar II disorder: Secondary | ICD-10-CM | POA: Diagnosis not present

## 2017-05-08 DIAGNOSIS — F410 Panic disorder [episodic paroxysmal anxiety] without agoraphobia: Secondary | ICD-10-CM | POA: Diagnosis not present

## 2017-05-14 ENCOUNTER — Telehealth: Payer: Self-pay

## 2017-05-14 NOTE — Telephone Encounter (Signed)
Pt states omeprazole is not working, has had some N&V, states when she vomits it feels like some pressure is relieved. appt 5/24 at 0945 [facility]

## 2017-05-14 NOTE — Telephone Encounter (Signed)
Thank you for the update!

## 2017-05-14 NOTE — Telephone Encounter (Signed)
Needs to speak with a nurse about omeprazole. Please call pt back.

## 2017-05-15 ENCOUNTER — Other Ambulatory Visit: Payer: Self-pay | Admitting: Internal Medicine

## 2017-05-15 ENCOUNTER — Ambulatory Visit: Payer: Medicare Other | Admitting: Pharmacist

## 2017-05-15 ENCOUNTER — Ambulatory Visit (INDEPENDENT_AMBULATORY_CARE_PROVIDER_SITE_OTHER): Payer: Medicare Other | Admitting: Internal Medicine

## 2017-05-15 VITALS — BP 138/80 | HR 82 | Temp 97.8°F | Ht 66.0 in | Wt 210.0 lb

## 2017-05-15 DIAGNOSIS — F17210 Nicotine dependence, cigarettes, uncomplicated: Secondary | ICD-10-CM

## 2017-05-15 DIAGNOSIS — F17200 Nicotine dependence, unspecified, uncomplicated: Secondary | ICD-10-CM

## 2017-05-15 DIAGNOSIS — K219 Gastro-esophageal reflux disease without esophagitis: Secondary | ICD-10-CM

## 2017-05-15 MED ORDER — NICOTINE POLACRILEX 2 MG MT GUM: CHEWING_GUM | OROMUCOSAL | 0 refills | Status: DC

## 2017-05-15 MED ORDER — NICOTINE 14 MG/24HR TD PT24: MEDICATED_PATCH | TRANSDERMAL | 0 refills | Status: DC

## 2017-05-15 MED ORDER — NICOTINE 7 MG/24HR TD PT24: MEDICATED_PATCH | TRANSDERMAL | 0 refills | Status: DC

## 2017-05-15 MED ORDER — RANITIDINE HCL 300 MG PO TABS: 300.0000 mg | ORAL_TABLET | Freq: Every day | ORAL | 1 refills | Status: DC

## 2017-05-15 MED ORDER — BUPROPION HCL ER (SR) 150 MG PO TB12: 150.0000 mg | ORAL_TABLET | Freq: Every day | ORAL | 2 refills | Status: DC

## 2017-05-15 NOTE — Patient Instructions (Signed)
Take omeprazole properly, in empty stomach, 30 mins before you eat.  Also take Zantac in addition.  Continue other medications  Follow up in 2 weeks if you are not getting any better.

## 2017-05-15 NOTE — Progress Notes (Signed)
   CC: GERD  HPI:  [name] is a 46 y.o. with pmh as listed below is here for GERD  Past Medical History:  Diagnosis Date  . Anemia   . Anxiety   . Anxiety and depression   . Asthma   . Bipolar 1 disorder ([place])   . Depression   . GERD (gastroesophageal reflux disease)   . Heart murmur   . Hemorrhoids   . Hiatal hernia   . Hypertension   . Internal hemorrhoids    Has chronic GERD, Had EGD on 03/2013 showing hiatal hernia.  Has been taking omeprazole daily but 2 days ago she started having indigestion, epigastric abd pain, abd pressure, had vomitting and pressure felt better after vomitting, continues to have indigestion. Taking Tums along with PPI without much improvement. No hematemsis or hematochezia or melena. No diarrhea, constipation is better with senna. co in terWas given omeprazole for GERD, but she feels that it's not working, had some N/V. Not taking NSAIDS   Review of Systems:    Review of Systems  Constitutional: Negative for chills and fever.  Cardiovascular: Negative for chest pain and palpitations.  Gastrointestinal: Positive for abdominal pain, heartburn, nausea and vomiting. Negative for blood in stool, constipation, diarrhea and melena.  Neurological: Negative for dizziness and headaches.     Physical Exam:  Vitals:   05/15/17 0952  BP: 138/80  Pulse: 82  Temp: 97.8 F (36.6 C)  TempSrc: Oral  SpO2: 100%  Weight: 210 lb (95.3 kg)  Height: 5\' 6"  (1.676 m)   Physical Exam  Constitutional: She is oriented to person, place, and time. She appears well-developed and well-nourished.  HENT:  Head: Normocephalic and atraumatic.  Mouth/Throat: No oropharyngeal exudate.  Eyes: Conjunctivae are normal.  Cardiovascular: Exam reveals no gallop and no friction rub.   No murmur heard. Respiratory: Effort normal and breath sounds normal. No respiratory distress. She has no wheezes.  GI: Soft. Bowel sounds are normal. She exhibits no distension. There  is no rebound.  Mild epigastric tenderness.   Neurological: She is alert and oriented to person, place, and time.    Assessment & Plan:   See Encounters Tab for problem based charting.  Patient discussed with Dr. Eppie Gibson

## 2017-05-15 NOTE — Assessment & Plan Note (Signed)
Having GERD symptoms likely due to not taking her PPI properly. No other clear triggers, eating non spicy non fatty food, not having caffeine intake. Not on NSAID. No alarming sx.  Instructed her on the proper way of taking PPI, continue omeprazole 40mg  daily for now Will add Ranitidine 300mg  as well  If not improving, will consider non ulcer dyspepsia/functionla dyspepsia as a diagnosis in the future given her hx of IBS and anxiety.

## 2017-05-15 NOTE — Progress Notes (Signed)
Case discussed with Dr. Genene Churn at the time of the visit. We reviewed the resident's history and exam and pertinent patient test results. I agree with the assessment, diagnosis, and plan of care documented in the resident's note.  If the additional H2 blocker and taking the omeprazole as directed do not address reflux symptoms the differential must expand to a non-ulcer dyspepsia or a structural issue that may require visualization (EGD).

## 2017-05-15 NOTE — Progress Notes (Signed)
Medication Samples have been provided to the patient.  Drug: Nicotine Patches Strength: 14mg  Qty: 14 patches LOT: [id number] Exp.Date: 06/21/2018 Dosing instructions: Place one patch onto the skin daily.   Drug: Nicotine Gum      Strength: 2mg       Qty: 170 pieces     LOT: [id number]      Exp Date:  03/23/2019  The patient has been instructed regarding the correct time, dose, and frequency of taking this medication, including desired effects and most common side effects.  Counseled pt on possibility of vivid dreams with the nicotine patch and to remove patch while sleeping if this occurs and is bothersome. Notified patient that we will call her pharmacy to try to get the patches covered. Patient endorsed understanding.  [place] [facility]uitline was also faxed and information for smoking cessation classes was provided.   Karmen Stabs , PharmD Candidate 2:55 PM 05/15/2017

## 2017-05-16 MED ORDER — NICOTINE 7 MG/24HR TD PT24: MEDICATED_PATCH | TRANSDERMAL | 0 refills | Status: DC

## 2017-05-16 MED ORDER — NICOTINE POLACRILEX 2 MG MT GUM: CHEWING_GUM | OROMUCOSAL | 0 refills | Status: DC

## 2017-05-16 MED ORDER — NICOTINE 14 MG/24HR TD PT24: MEDICATED_PATCH | TRANSDERMAL | 0 refills | Status: DC

## 2017-05-16 MED FILL — SM NICOTINE 2 MG CHEWING GU: 2 | 15 days supply | Qty: 110 | Fill #0

## 2017-05-16 MED FILL — NICOTINE 14 MG/24HR PATCH: 14 | 28 days supply | Qty: 28 | Fill #0

## 2017-05-16 NOTE — Progress Notes (Signed)
[facility] was unable to process NRT successfully. Transferred prescription to [place] and spoke to pharmacist who confirmed successful insurance coverage of NRT. Patient was notified.

## 2017-05-26 DIAGNOSIS — F410 Panic disorder [episodic paroxysmal anxiety] without agoraphobia: Secondary | ICD-10-CM | POA: Diagnosis not present

## 2017-05-26 DIAGNOSIS — F3181 Bipolar II disorder: Secondary | ICD-10-CM | POA: Diagnosis not present

## 2017-06-03 DIAGNOSIS — F410 Panic disorder [episodic paroxysmal anxiety] without agoraphobia: Secondary | ICD-10-CM | POA: Diagnosis not present

## 2017-06-03 DIAGNOSIS — F3181 Bipolar II disorder: Secondary | ICD-10-CM | POA: Diagnosis not present

## 2017-06-13 IMAGING — MG 2D DIGITAL SCREENING BILATERAL MAMMOGRAM WITH CAD AND ADJUNCT TO
8 of 12 series · 8 of 28 positions shown · non-contrast
Comparison: Previous exam(s).

CLINICAL DATA: Screening.

EXAM:
2D DIGITAL SCREENING BILATERAL MAMMOGRAM WITH CAD AND ADJUNCT TOMO

[L MLO]
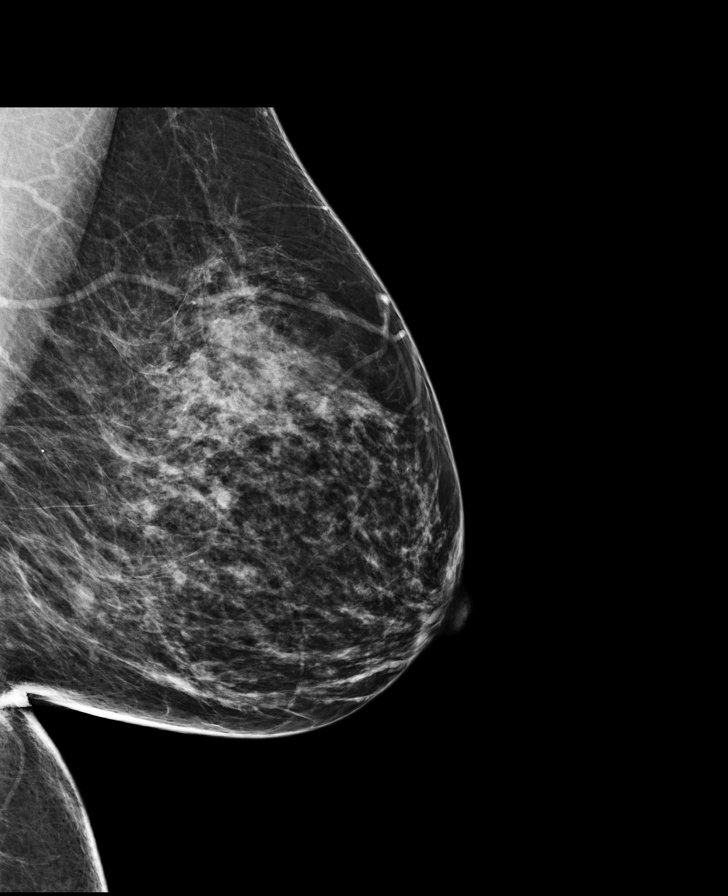

[L CC]
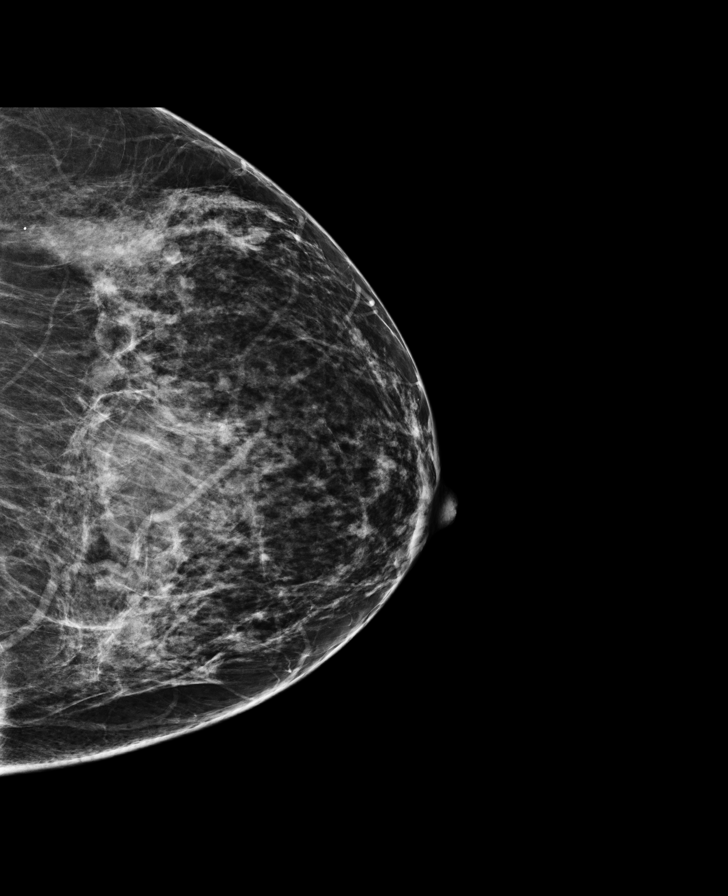

[L MLO synth-2D]
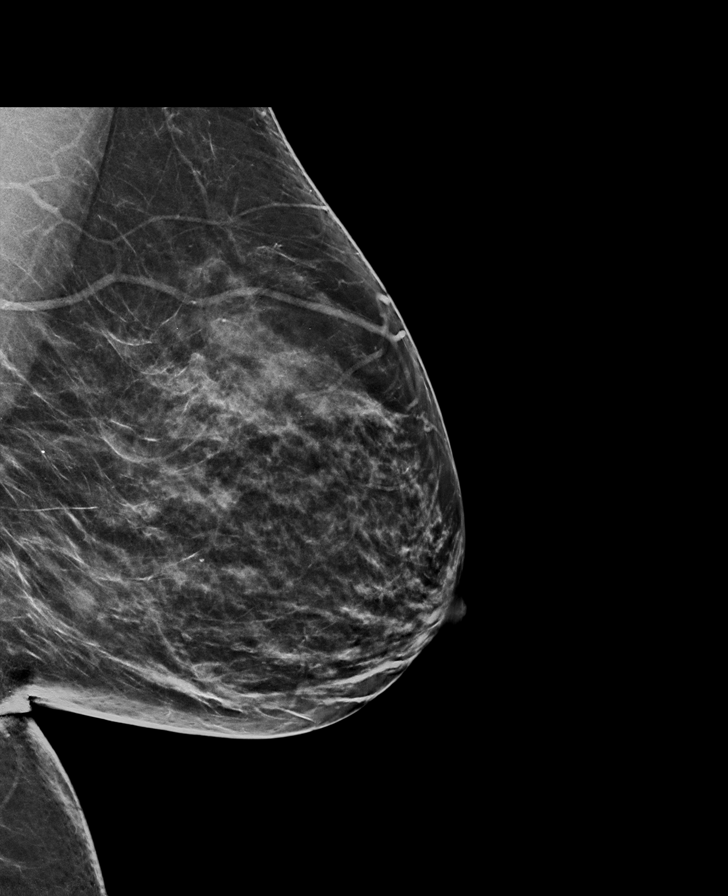

[R CC]
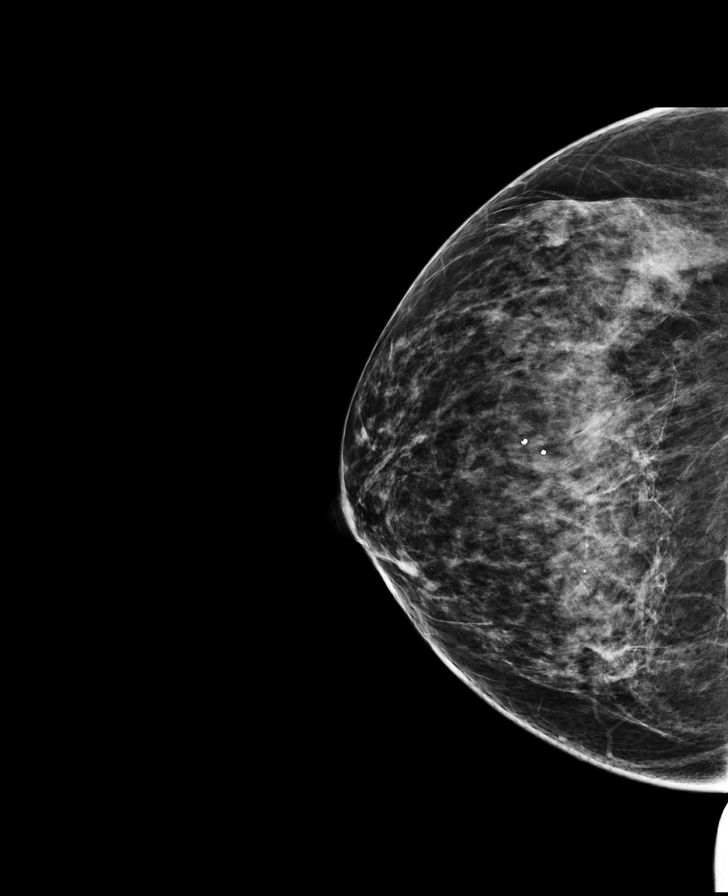

[R MLO synth-2D]
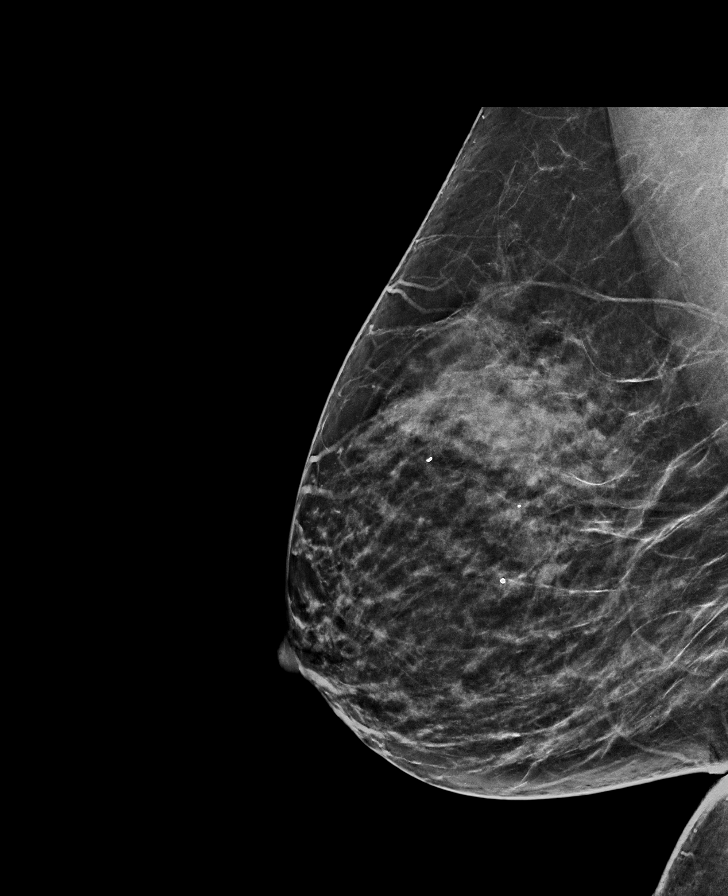

[L CC synth-2D]
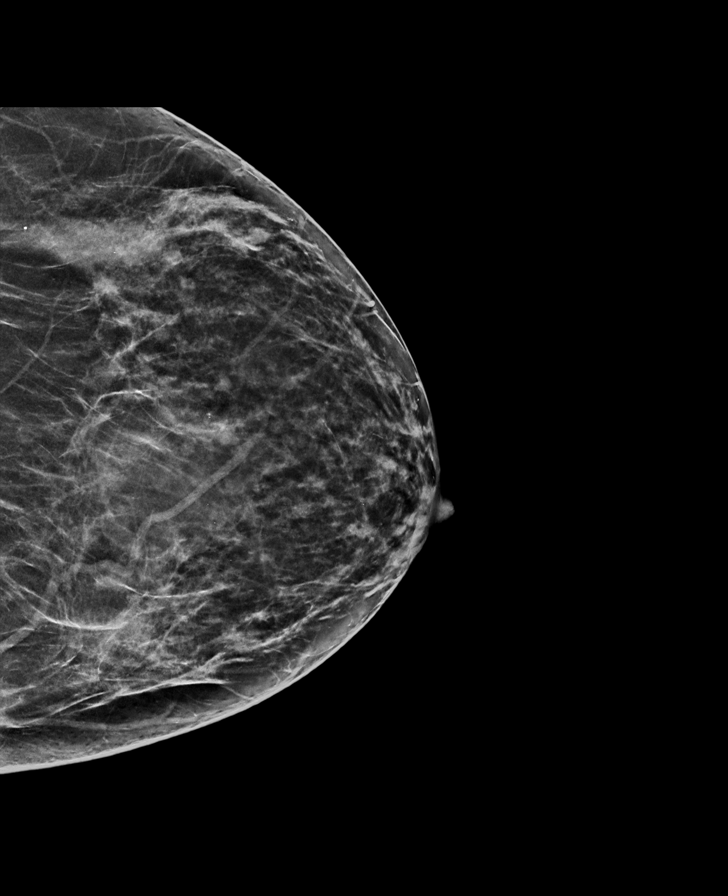

[R MLO]
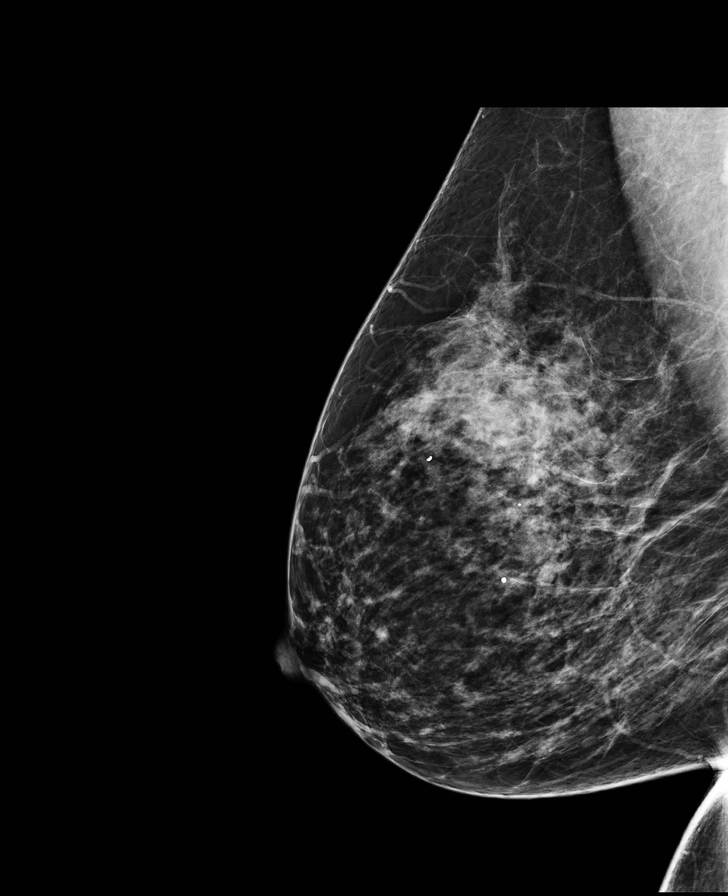

[R CC synth-2D]
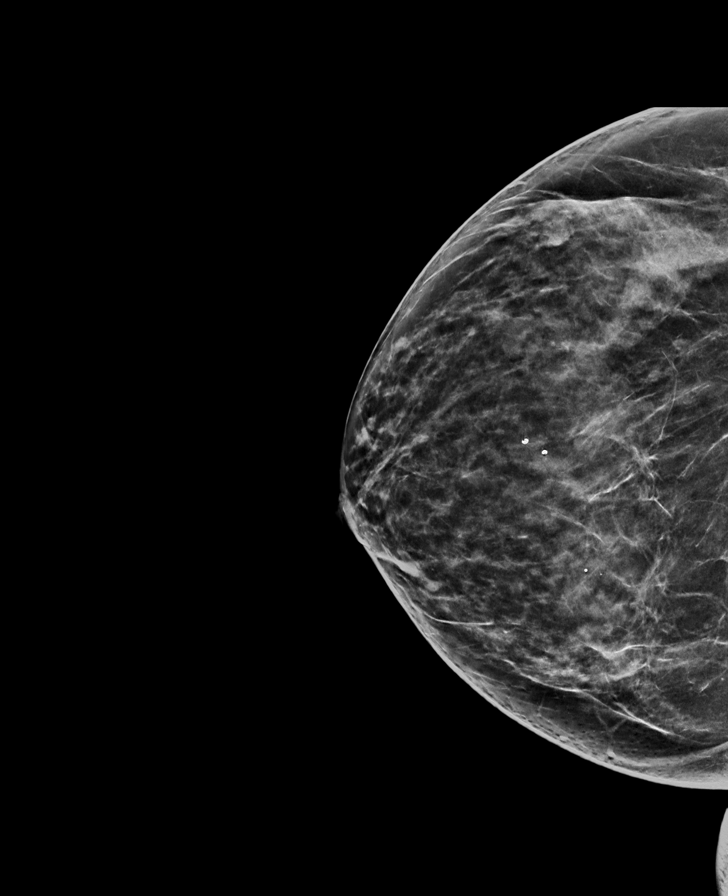

[8 of 28 positions shown; findings below may reference images not displayed]

ACR Breast Density Category c: The breast tissue is heterogeneously
dense, which may obscure small masses.
FINDINGS: There are no findings suspicious for malignancy. Images were
processed with CAD.
IMPRESSION: No mammographic evidence of malignancy. A result letter of this
screening mammogram will be mailed directly to the patient.

RECOMMENDATION:
Screening mammogram in one year. (Code:[id number])

BI-RADS CATEGORY  1: Negative.

## 2017-07-03 DIAGNOSIS — F410 Panic disorder [episodic paroxysmal anxiety] without agoraphobia: Secondary | ICD-10-CM | POA: Diagnosis not present

## 2017-07-03 DIAGNOSIS — F3181 Bipolar II disorder: Secondary | ICD-10-CM | POA: Diagnosis not present

## 2017-07-08 DIAGNOSIS — F410 Panic disorder [episodic paroxysmal anxiety] without agoraphobia: Secondary | ICD-10-CM | POA: Diagnosis not present

## 2017-07-08 DIAGNOSIS — F3181 Bipolar II disorder: Secondary | ICD-10-CM | POA: Diagnosis not present

## 2017-07-09 ENCOUNTER — Other Ambulatory Visit: Payer: Self-pay | Admitting: Internal Medicine

## 2017-07-14 ENCOUNTER — Other Ambulatory Visit: Payer: Self-pay | Admitting: Internal Medicine

## 2017-07-15 DIAGNOSIS — H40013 Open angle with borderline findings, low risk, bilateral: Secondary | ICD-10-CM | POA: Diagnosis not present

## 2017-07-22 ENCOUNTER — Other Ambulatory Visit: Payer: Self-pay | Admitting: Internal Medicine

## 2017-07-22 DIAGNOSIS — Z1231 Encounter for screening mammogram for malignant neoplasm of breast: Secondary | ICD-10-CM

## 2017-07-24 DIAGNOSIS — F3181 Bipolar II disorder: Secondary | ICD-10-CM | POA: Diagnosis not present

## 2017-07-24 DIAGNOSIS — F410 Panic disorder [episodic paroxysmal anxiety] without agoraphobia: Secondary | ICD-10-CM | POA: Diagnosis not present

## 2017-08-13 DIAGNOSIS — F3181 Bipolar II disorder: Secondary | ICD-10-CM | POA: Diagnosis not present

## 2017-08-13 DIAGNOSIS — F410 Panic disorder [episodic paroxysmal anxiety] without agoraphobia: Secondary | ICD-10-CM | POA: Diagnosis not present

## 2017-08-14 ENCOUNTER — Encounter: Payer: Self-pay | Admitting: Internal Medicine

## 2017-08-14 ENCOUNTER — Ambulatory Visit (INDEPENDENT_AMBULATORY_CARE_PROVIDER_SITE_OTHER): Payer: Medicare Other | Admitting: Internal Medicine

## 2017-08-14 VITALS — BP 149/72 | HR 88 | Temp 98.2°F | Ht 65.0 in | Wt 216.5 lb

## 2017-08-14 DIAGNOSIS — Z8659 Personal history of other mental and behavioral disorders: Secondary | ICD-10-CM

## 2017-08-14 DIAGNOSIS — K219 Gastro-esophageal reflux disease without esophagitis: Secondary | ICD-10-CM | POA: Diagnosis not present

## 2017-08-14 DIAGNOSIS — Z79899 Other long term (current) drug therapy: Secondary | ICD-10-CM

## 2017-08-14 DIAGNOSIS — F17210 Nicotine dependence, cigarettes, uncomplicated: Secondary | ICD-10-CM | POA: Diagnosis not present

## 2017-08-14 DIAGNOSIS — I10 Essential (primary) hypertension: Secondary | ICD-10-CM | POA: Diagnosis present

## 2017-08-14 DIAGNOSIS — Z8719 Personal history of other diseases of the digestive system: Secondary | ICD-10-CM | POA: Diagnosis not present

## 2017-08-14 MED ORDER — AMLODIPINE BESYLATE 5 MG PO TABS: 5.0000 mg | ORAL_TABLET | Freq: Every day | ORAL | 1 refills | Status: DC

## 2017-08-14 MED ORDER — RANITIDINE HCL 300 MG PO TABS: 300.0000 mg | ORAL_TABLET | Freq: Every day | ORAL | 1 refills | Status: DC

## 2017-08-14 MED ORDER — DICYCLOMINE HCL 20 MG PO TABS: 10.0000 mg | ORAL_TABLET | Freq: Three times a day (TID) | ORAL | 1 refills | Status: DC

## 2017-08-14 NOTE — Progress Notes (Signed)
   CC: here for f/u HTN and GERD  HPI:  Jasmine.Jasmine Jackson is a 46 y.o. woman with a past medical history listed below here today for follow up of her HTN and GERD.  For details of today's visit and the status of her chronic medical issues please refer to the assessment and plan.   Past Medical History:  Diagnosis Date  . Anemia   . Anxiety   . Anxiety and depression   . Asthma   . Bipolar 1 disorder ([place])   . Depression   . GERD (gastroesophageal reflux disease)   . Heart murmur   . Hemorrhoids   . Hiatal hernia   . Hypertension   . Internal hemorrhoids    Review of Systems:  Please see pertinent ROS reviewed in HPI and problem based charting.   Physical Exam:  Vitals:   08/14/17 1420  BP: (!) 149/72  Pulse: 88  Temp: 98.2 F (36.8 C)  TempSrc: Oral  SpO2: 100%  Weight: 216 lb 8 oz (98.2 kg)  Height: 5\' 5"  (1.651 m)   General: NAD, very pleasant HEENT: NCAT, EOMI, no scleral icterus Cardiac: RRR Pulm: clear to auscultation bilaterally, moving normal volumes of air Abd: soft, nontender, nondistended, BS present  Assessment & Plan:   See Encounters Tab for problem based charting.  Patient discussed with Dr. Daryll Drown .  Hypertension BP Readings from Last 3 Encounters:  08/14/17 (!) 149/72  05/15/17 138/80  03/13/17 (!) 119/56   Assessment: Jasmine Jackson's BP is elevated today and she has noticed some weight gain since cutting back on her cigarette smoking.  She is adherent to her HCTZ 25mg  daily.  Plan: - continue HCTZ 25mg  Qday - add amlodipine 5mg  Qday - encouraged low sodium diet along with diet and exercise - home BP monitoring and bring BP log to next appointment - RTC 3 months  Esophageal reflux Assessment: She continues to have GERD-like symptoms.  Most notably she is having daily indigestion and belching.  No significant discomfort or pain.  She has been on PPI and H2 blocker since May.  She reports symptoms of having difficulty swallowing her  capsule medications but not her tablets.  She has no dysphagia to other liquids or solids.  Plan: - continue PPI and ranitidine - given that she is continuing to have symptoms, will refer to GI for consideration  of EGD and evaluation for non-ulcer dyspepsia or functional dyspepsia given her history of IBS and anxiety

## 2017-08-14 NOTE — Patient Instructions (Signed)
Thank you for coming to see me today. It was a pleasure. Today we talked about:   Blood pressure: I have added amlodipine 5mg  daily to your medications.  Please keep a blood pressure log so that we can titrate your medications as needed.  Reflux and difficulty with pills: I have changed your Bentyl to tablets.  I have referred you to gastroenterology for further evaluation.  I have also refilled your zantac  Please follow-up with me in 3 months.  If you have any questions or concerns, please do not hesitate to call the office at [phone].  Take Care,   Jule Ser, DO

## 2017-08-15 NOTE — Assessment & Plan Note (Signed)
BP Readings from Last 3 Encounters:  08/14/17 (!) 149/72  05/15/17 138/80  03/13/17 (!) 119/56   Assessment: Jasmine Jackson's BP is elevated today and she has noticed some weight gain since cutting back on her cigarette smoking.  She is adherent to her HCTZ 25mg  daily.  Plan: - continue HCTZ 25mg  Qday - add amlodipine 5mg  Qday - encouraged low sodium diet along with diet and exercise - home BP monitoring and bring BP log to next appointment - RTC 3 months

## 2017-08-15 NOTE — Assessment & Plan Note (Addendum)
Assessment: She continues to have GERD-like symptoms.  Most notably she is having daily indigestion and belching.  No significant discomfort or pain.  She has been on PPI and H2 blocker since May.  She reports symptoms of having difficulty swallowing her capsule medications but not her tablets.  She has no dysphagia to other liquids or solids.  Plan: - continue PPI and ranitidine - given that she is continuing to have symptoms, will refer to GI for consideration  of EGD and evaluation for non-ulcer dyspepsia or functional dyspepsia given her history of IBS and anxiety

## 2017-08-19 ENCOUNTER — Encounter: Payer: Self-pay | Admitting: *Deleted

## 2017-08-20 ENCOUNTER — Other Ambulatory Visit: Payer: Self-pay

## 2017-08-20 NOTE — Progress Notes (Signed)
Internal Medicine Clinic Attending  Case discussed with Dr. Wallace at the time of the visit.  We reviewed the resident's history and exam and pertinent patient test results.  I agree with the assessment, diagnosis, and plan of care documented in the resident's note.  

## 2017-08-20 NOTE — Telephone Encounter (Signed)
Questions about bp and meds. Please call pt back.

## 2017-08-22 NOTE — Telephone Encounter (Signed)
Called pt - no answer; left message to give us a call back. 

## 2017-09-01 ENCOUNTER — Emergency Department (HOSPITAL_COMMUNITY): Payer: Medicare Other

## 2017-09-01 ENCOUNTER — Emergency Department (HOSPITAL_COMMUNITY)
Admission: EM | Admit: 2017-09-01 | Discharge: 2017-09-01 | Disposition: A | Discharge status: Home / Self Care | Payer: Medicare Other | Attending: Physician Assistant | Admitting: Physician Assistant

## 2017-09-01 ENCOUNTER — Encounter (HOSPITAL_COMMUNITY): Payer: Self-pay | Admitting: *Deleted

## 2017-09-01 DIAGNOSIS — Z79899 Other long term (current) drug therapy: Secondary | ICD-10-CM | POA: Diagnosis not present

## 2017-09-01 DIAGNOSIS — W19XXXA Unspecified fall, initial encounter: Secondary | ICD-10-CM

## 2017-09-01 DIAGNOSIS — R110 Nausea: Secondary | ICD-10-CM | POA: Diagnosis not present

## 2017-09-01 DIAGNOSIS — F17210 Nicotine dependence, cigarettes, uncomplicated: Secondary | ICD-10-CM | POA: Insufficient documentation

## 2017-09-01 DIAGNOSIS — G44319 Acute post-traumatic headache, not intractable: Secondary | ICD-10-CM | POA: Insufficient documentation

## 2017-09-01 DIAGNOSIS — J45909 Unspecified asthma, uncomplicated: Secondary | ICD-10-CM | POA: Insufficient documentation

## 2017-09-01 DIAGNOSIS — M542 Cervicalgia: Secondary | ICD-10-CM | POA: Insufficient documentation

## 2017-09-01 DIAGNOSIS — I10 Essential (primary) hypertension: Secondary | ICD-10-CM | POA: Diagnosis not present

## 2017-09-01 DIAGNOSIS — H538 Other visual disturbances: Secondary | ICD-10-CM | POA: Diagnosis not present

## 2017-09-01 DIAGNOSIS — R51 Headache: Secondary | ICD-10-CM | POA: Diagnosis not present

## 2017-09-01 DIAGNOSIS — S0990XA Unspecified injury of head, initial encounter: Secondary | ICD-10-CM | POA: Diagnosis not present

## 2017-09-01 DIAGNOSIS — S199XXA Unspecified injury of neck, initial encounter: Secondary | ICD-10-CM | POA: Diagnosis not present

## 2017-09-01 DIAGNOSIS — W108XXA Fall (on) (from) other stairs and steps, initial encounter: Secondary | ICD-10-CM | POA: Diagnosis not present

## 2017-09-01 MED ORDER — CYCLOBENZAPRINE HCL 10 MG PO TABS: 10.0000 mg | ORAL_TABLET | Freq: Every evening | ORAL | 0 refills | Status: DC | PRN

## 2017-09-01 MED ORDER — METOCLOPRAMIDE HCL 5 MG/ML IJ SOLN
10.0000 mg | Freq: Once | INTRAMUSCULAR | Status: AC
  Administered 2017-09-01: 10 mg via INTRAMUSCULAR
  Filled 2017-09-01: qty 2

## 2017-09-01 MED ORDER — ONDANSETRON 4 MG PO TBDP
4.0000 mg | ORAL_TABLET | Freq: Once | ORAL | Status: AC
  Administered 2017-09-01: 4 mg via ORAL

## 2017-09-01 MED ORDER — DIPHENHYDRAMINE HCL 50 MG/ML IJ SOLN
25.0000 mg | Freq: Once | INTRAMUSCULAR | Status: AC
  Administered 2017-09-01: 25 mg via INTRAMUSCULAR
  Filled 2017-09-01: qty 1

## 2017-09-01 MED ORDER — ONDANSETRON 4 MG PO TBDP
ORAL_TABLET | ORAL | Status: AC
  Filled 2017-09-01: qty 1

## 2017-09-01 NOTE — ED Provider Notes (Signed)
[facility] DEPT Provider Note   CSN: [id number] Arrival date & time: 09/01/17  0908     History   Chief Complaint Chief Complaint  Patient presents with  . Fall  . Emesis    HPI NOLAH KRENZER is a 46 y.o. female w PMHx anemia, axiety, GERD, bipolar 1, gout, presented to the ED status post mechanical fall yesterday down stairs. Patient states she was pushing her bike up the stairs and slipped, falling backwards down multiple steps. She states she landed on her back and hit her head slightly in the back. Denies LOC. Reports neck soreness since the fall, as well as dull aching headache located on the top of her head. She reports associated photophobia and nausea, denies numbness or tingling down extremities, back pain, chest abdominal pain, bowel or bladder incontinence, or other complaints today.  The history is provided by the patient.    Past Medical History:  Diagnosis Date  . Anemia   . Anxiety   . Anxiety and depression   . Asthma   . Bipolar 1 disorder ([place])   . Depression   . GERD (gastroesophageal reflux disease)   . Heart murmur   . Hemorrhoids   . Hiatal hernia   . Hypertension   . Internal hemorrhoids     Patient Active Problem List   Diagnosis Date Noted  . Colitis 03/06/2017  . Vaginal discharge 12/25/2016  . Hot flashes 11/27/2016  . Tobacco use  10/18/2016  . Panic attacks 09/09/2016  . Health care maintenance 01/11/2016  . Low back pain 12/02/2015  . Hypertension 04/24/2015  . Muscle spasm 04/24/2015  . Bipolar disorder ([place]) 04/16/2014  . History of gout 04/01/2014  . Internal hemorrhoids without complication 66/44/0347  . Irritable bowel syndrome 12/08/2013  . Esophageal reflux 12/08/2013    Past Surgical History:  Procedure Laterality Date  . ABDOMINAL HYSTERECTOMY     fibroids  . HEMORRHOID SURGERY    . INGUINAL HERNIA REPAIR    . TONSILLECTOMY    . TUBAL LIGATION      OB History    No data available       Home  Medications    Prior to Admission medications   Medication Sig Start Date End Date Taking? Authorizing Provider  acetaminophen (TYLENOL) 500 MG tablet Take 2 tablets (1,000 mg total) by mouth every 8 (eight) hours as needed. 03/06/17 03/06/18  Norman Herrlich, MD  albuterol (PROVENTIL HFA;VENTOLIN HFA) 108 (90 Base) MCG/ACT inhaler Inhale 2 puffs into the lungs every 6 (six) hours as needed for wheezing or shortness of breath. 10/18/16   Jule Ser, DO  alum & mag hydroxide-simeth (MAALOX REGULAR STRENGTH) 200-200-20 MG/5ML suspension Take 30 mLs by mouth every 6 (six) hours as needed for indigestion or heartburn. 04/26/17   Rice, Resa Miner, MD  amLODipine (NORVASC) 5 MG tablet Take 1 tablet (5 mg total) by mouth daily. 08/14/17 08/14/18  Jule Ser, DO  buPROPion (WELLBUTRIN SR) 150 MG 12 hr tablet Take 1 tablet (150 mg total) by mouth daily. 05/15/17   Jule Ser, DO  busPIRone (BUSPAR) 15 MG tablet Take 30 mg by mouth 2 (two) times daily.    [provider]  colchicine (COLCRYS) 0.6 MG tablet Take 1 tablet (0.6 mg total) by mouth daily. Patient taking differently: Take 0.6 mg by mouth daily as needed (gout.).  11/29/15   Hoyt Koch, MD  cyclobenzaprine (FLEXERIL) 10 MG tablet Take 1 tablet (10 mg total) by mouth at  bedtime as needed for muscle spasms. 09/01/17   Russo, Martinique N, PA-C  diclofenac sodium (VOLTAREN) 1 % GEL Apply 2 g topically 4 (four) times daily. 05/01/17   Jule Ser, DO  dicyclomine (BENTYL) 20 MG tablet Take 0.5 tablets (10 mg total) by mouth 4 (four) times daily -  before meals and at bedtime. 08/14/17   Jule Ser, DO  FLUoxetine (PROZAC) 40 MG capsule Take 80 mg by mouth every morning.     [provider]  gabapentin (NEURONTIN) 800 MG tablet Take 800 mg by mouth 3 (three) times daily. 04/01/16   [provider]  hydrochlorothiazide (HYDRODIURIL) 25 MG tablet TAKE 1 TABLET BY MOUTH DAILY 07/16/17   Jule Ser, DO    hydrOXYzine (ATARAX/VISTARIL) 50 MG tablet Take 50 mg by mouth 3 (three) times daily. 02/13/17   [provider]  nicotine (NICODERM CQ - DOSED IN MG/24 HOURS) 14 mg/24hr patch RX #1 Weeks 1-6: 14 mg x 1 patch daily. Wear for 24 hours. If you have sleep disturbances, remove at bedtime. 05/16/17   Jule Ser, DO  nicotine (NICODERM CQ - DOSED IN MG/24 HR) 7 mg/24hr patch RX #2 Weeks 7-8: 7 mg x 1 patch dailyWear for 24 hours. If you have sleep disturbances, remove at bedtime.. 05/16/17   Jule Ser, DO  nicotine polacrilex (NICORETTE) 2 MG gum Pharmacist, dispense quantity sufficient. See notes below for instructions 05/16/17   Jule Ser, DO  omeprazole (PRILOSEC) 40 MG capsule Take 1 capsule (40 mg total) by mouth daily. 10/18/16   Jule Ser, DO  ondansetron (ZOFRAN ODT) 4 MG disintegrating tablet Take 1 tablet (4 mg total) by mouth every 8 (eight) hours as needed for nausea or vomiting. 03/04/17   Montine Circle, PA-C  polyethylene glycol (MIRALAX / GLYCOLAX) packet Take 17 g by mouth daily. 04/26/17   Collier Salina, MD  QUEtiapine (SEROQUEL XR) 400 MG 24 hr tablet Take 1,200 mg by mouth at bedtime. 08/03/17   [provider]  QUEtiapine (SEROQUEL) 25 MG tablet Take 25 mg by mouth 2 (two) times daily as needed. 07/15/17   [provider]  ranitidine (ZANTAC) 300 MG tablet Take 1 tablet (300 mg total) by mouth daily. 08/14/17   Jule Ser, DO  senna (SENOKOT) 8.6 MG TABS tablet Take 2 tablets by mouth daily 07/09/17   Jule Ser, DO  tiZANidine (ZANAFLEX) 2 MG tablet Take 1 tablet (2 mg total) by mouth every 8 (eight) hours as needed for muscle spasms. 10/18/16   Jule Ser, DO  vitamin E 400 UNIT capsule Take 1 capsule (400 Units total) by mouth daily. 11/27/16 11/27/17  Norman Herrlich, MD    Family History Family History  Problem Relation Age of Onset  . Breast cancer Maternal Aunt        x4  . Stomach cancer Maternal Aunt   .  Prostate cancer Maternal Uncle        x2  . Breast cancer Maternal Grandmother   . Celiac disease Mother   . Colon cancer Neg Hx   . Esophageal cancer Neg Hx   . Rectal cancer Neg Hx     Social History Social History  Substance Use Topics  . Smoking status: Current Every Day Smoker    Packs/day: 0.25    Types: Cigarettes  . Smokeless tobacco: Never Used     Comment: 3 CIG A DAY, USES A PATCH  . Alcohol use No     Allergies   Diona Fanti [  aspirin] and Morphine and related   Review of Systems Review of Systems  Constitutional: Negative for fever.  Eyes: Positive for photophobia. Negative for visual disturbance.  Respiratory: Negative for shortness of breath.   Cardiovascular: Negative for chest pain.  Gastrointestinal: Positive for nausea. Negative for abdominal pain and vomiting.       No bowel incontinence  Genitourinary: Negative for difficulty urinating.  Musculoskeletal: Positive for neck pain. Negative for back pain.  Skin: Negative for color change and wound.  Neurological: Positive for headaches. Negative for syncope and numbness.  Hematological: Does not bruise/bleed easily.     Physical Exam Updated Vital Signs BP 113/85   Pulse 79   Temp 97.8 F (36.6 C) (Oral)   Resp 16   Ht 5\' 5"  (1.651 m)   Wt 98 kg (216 lb)   SpO2 97%   BMI 35.94 kg/m   Physical Exam  Constitutional: She is oriented to person, place, and time. She appears well-developed and well-nourished. No distress. Cervical collar in place.  Well-appearing  HENT:  Head: Normocephalic and atraumatic.  Mouth/Throat: Oropharynx is clear and moist.  No scalp hematoma or wound. No crepitus or tenderness.  Eyes: Pupils are equal, round, and reactive to light. Conjunctivae and EOM are normal.  Neck: Normal range of motion. Neck supple. No tracheal deviation present.  No C-spine or paraspinal tenderness on exam.  Cardiovascular: Normal rate, regular rhythm, normal heart sounds and intact distal pulses.   Exam reveals no friction rub.   No murmur heard. Pulmonary/Chest: Effort normal and breath sounds normal. No stridor. No respiratory distress. She has no wheezes. She has no rales. She exhibits no tenderness.  Abdominal: Soft. Bowel sounds are normal. She exhibits no distension and no mass. There is no tenderness. There is no rebound and no guarding.  Musculoskeletal: Normal range of motion.  No T or L-spine or paraspinal tenderness. The bony step-offs, no gross deformities. Moving all extremities. No edema, ecchymosis or deformities.  Lymphadenopathy:    She has no cervical adenopathy.  Neurological: She is alert and oriented to person, place, and time.  Mental Status:  Alert, oriented, thought content appropriate, able to give a coherent history. Speech fluent without evidence of aphasia. Able to follow 2 step commands without difficulty.  Cranial Nerves:  II:  Peripheral visual fields grossly normal, pupils equal, round, reactive to light III,IV, VI: ptosis not present, extra-ocular motions intact bilaterally  V,VII: smile symmetric, facial light touch sensation equal VIII: hearing grossly normal to voice  X: uvula elevates symmetrically  XI: bilateral shoulder shrug symmetric and strong XII: midline tongue extension without fassiculations Motor:  Normal tone. 5/5 in upper and lower extremities bilaterally including strong and equal grip strength and dorsiflexion/plantar flexion Sensory: Pinprick and light touch normal in all extremities.  Deep Tendon Reflexes: 2+ and symmetric in the biceps and patella Cerebellar: normal finger-to-nose with bilateral upper extremities Gait: normal gait and balance CV: distal pulses palpable throughout    Skin: Skin is warm.  Psychiatric: She has a normal mood and affect. Her behavior is normal.  Nursing note and vitals reviewed.    ED Treatments / Results  Labs (all labs ordered are listed, but only abnormal results are displayed) Labs  Reviewed - No data to display  EKG  EKG Interpretation None       Radiology Ct Head Wo Contrast  Result Date: 09/01/2017 CLINICAL DATA:  Headache and neck pain after falling yesterday. EXAM: CT HEAD WITHOUT CONTRAST CT CERVICAL  SPINE WITHOUT CONTRAST TECHNIQUE: Multidetector CT imaging of the head and cervical spine was performed following the standard protocol without intravenous contrast. Multiplanar CT image reconstructions of the cervical spine were also generated. COMPARISON:  None. FINDINGS: CT HEAD FINDINGS Brain: No evidence of acute infarction, hemorrhage, hydrocephalus, extra-axial collection or mass lesion/mass effect. Vascular: No hyperdense vessel or unexpected calcification. Skull: Normal. Negative for fracture or focal lesion. Sinuses/Orbits: The bilateral paranasal sinuses and mastoid air cells are clear. The orbits are unremarkable. Other: None. CT CERVICAL SPINE FINDINGS Alignment: Slight reversal of the normal cervical lordosis centered at C5. Otherwise normal. Skull base and vertebrae: No acute fracture. No primary bone lesion or focal pathologic process. Congenital nonunion of the posterior arch of C1. Soft tissues and spinal canal: No prevertebral fluid or swelling. No visible canal hematoma. Disc levels: Mild degenerative disc disease at C6-C7. Mild right facet arthropathy at C4-C5. Upper chest: Negative. Other: None. IMPRESSION: 1. Normal noncontrast head CT. 2.  No acute cervical spine fracture. Electronically Signed   By: Titus Dubin M.D.   On: 09/01/2017 11:17   Ct Cervical Spine Wo Contrast  Result Date: 09/01/2017 CLINICAL DATA:  Headache and neck pain after falling yesterday. EXAM: CT HEAD WITHOUT CONTRAST CT CERVICAL SPINE WITHOUT CONTRAST TECHNIQUE: Multidetector CT imaging of the head and cervical spine was performed following the standard protocol without intravenous contrast. Multiplanar CT image reconstructions of the cervical spine were also generated.  COMPARISON:  None. FINDINGS: CT HEAD FINDINGS Brain: No evidence of acute infarction, hemorrhage, hydrocephalus, extra-axial collection or mass lesion/mass effect. Vascular: No hyperdense vessel or unexpected calcification. Skull: Normal. Negative for fracture or focal lesion. Sinuses/Orbits: The bilateral paranasal sinuses and mastoid air cells are clear. The orbits are unremarkable. Other: None. CT CERVICAL SPINE FINDINGS Alignment: Slight reversal of the normal cervical lordosis centered at C5. Otherwise normal. Skull base and vertebrae: No acute fracture. No primary bone lesion or focal pathologic process. Congenital nonunion of the posterior arch of C1. Soft tissues and spinal canal: No prevertebral fluid or swelling. No visible canal hematoma. Disc levels: Mild degenerative disc disease at C6-C7. Mild right facet arthropathy at C4-C5. Upper chest: Negative. Other: None. IMPRESSION: 1. Normal noncontrast head CT. 2.  No acute cervical spine fracture. Electronically Signed   By: Titus Dubin M.D.   On: 09/01/2017 11:17    Procedures Procedures (including critical care time)  Medications Ordered in ED Medications  ondansetron (ZOFRAN-ODT) disintegrating tablet 4 mg (4 mg Oral Given 09/01/17 1010)  metoCLOPramide (REGLAN) injection 10 mg (10 mg Intramuscular Given 09/01/17 1200)  diphenhydrAMINE (BENADRYL) injection 25 mg (25 mg Intramuscular Given 09/01/17 1200)     Initial Impression / Assessment and Plan / ED Course  I have reviewed the triage vital signs and the nursing notes.  Pertinent labs & imaging results that were available during my care of the patient were reviewed by me and considered in my medical decision making (see chart for details).     Patient presenting status post fall down the stairs with headache and neck pain. CT head and C-spine done in triage, and negative for acute pathology. Remove cervical collar, and patient without tenderness and with full range of motion. No  neurologic deficits. No scalp hematoma or tenderness. Headache treated in ED with Reglan and Benadryl with improvement in symptoms. Recommend symptomatic management and PCP follow-up in 2 days. Strict return precautions discussed. Patient is safe for discharge home.  Patient discussed with and seen by Dr. Thomasene Lot.  Discussed results, findings, treatment and follow up. Patient advised of return precautions. Patient verbalized understanding and agreed with plan.   Final Clinical Impressions(s) / ED Diagnoses   Final diagnoses:  Fall, initial encounter  Neck pain, acute  Acute post-traumatic headache, not intractable    New Prescriptions Discharge Medication List as of 09/01/2017 12:56 PM    START taking these medications   Details  cyclobenzaprine (FLEXERIL) 10 MG tablet Take 1 tablet (10 mg total) by mouth at bedtime as needed for muscle spasms., Starting Mon 09/01/2017, Print         Virgina Jock Martinique N, PA-C 09/01/17 1356    Macarthur Critchley, MD 09/04/17 1017

## 2017-09-01 NOTE — ED Triage Notes (Signed)
Pt states was pushing her bike up stairs yesterday and slipped and fell down 10 stairs.  No loc.  Pt was able to get herself up.  No c/o post neck pain and headache with dry heaves.  Pupils equal and reactive.  Pt is photophobic.  Placed in [place].

## 2017-09-01 NOTE — Discharge Instructions (Signed)
Please read instructions below. Apply ice to your neck for 20 minutes at a time. You can take tylenol every 6 hours as needed for pain. Schedule an appointment with your primary care provider in 2 days for follow-up on your injuries. Return to the ER for severely worsening headache, vision changes, vomiting, numbness in your extremities, bowel or bladder incontinence, or new or concerning symptoms.

## 2017-09-16 DIAGNOSIS — Z23 Encounter for immunization: Secondary | ICD-10-CM | POA: Diagnosis not present

## 2017-10-02 ENCOUNTER — Ambulatory Visit
Admission: RE | Admit: 2017-10-02 | Discharge: 2017-10-02 | Disposition: A | Discharge status: Home / Self Care | Payer: Medicare Other | Source: Ambulatory Visit | Attending: Family Medicine | Admitting: Family Medicine

## 2017-10-02 DIAGNOSIS — Z1231 Encounter for screening mammogram for malignant neoplasm of breast: Secondary | ICD-10-CM

## 2017-10-10 ENCOUNTER — Ambulatory Visit: Payer: Medicare Other | Admitting: Gastroenterology

## 2017-10-10 NOTE — Addendum Note (Signed)
Addended by: Hulan Fray on: 10/10/2017 05:39 PM   Modules accepted: Orders

## 2017-10-21 ENCOUNTER — Telehealth: Payer: Self-pay | Admitting: Internal Medicine

## 2017-10-21 NOTE — Telephone Encounter (Signed)
Patient requesting a new Psychiatric Referral as she can no longer go to her previous office.   Please advise.

## 2017-10-22 ENCOUNTER — Other Ambulatory Visit: Payer: Self-pay | Admitting: Internal Medicine

## 2017-10-22 DIAGNOSIS — F319 Bipolar disorder, unspecified: Secondary | ICD-10-CM

## 2017-10-22 NOTE — Telephone Encounter (Signed)
New referral order placed for psychiatry to help manage her bipolar disorder.  Thanks.

## 2017-10-29 NOTE — Telephone Encounter (Signed)
Notified patient that Referral was placed and Sent to [facility].

## 2017-11-05 ENCOUNTER — Encounter: Payer: Self-pay | Admitting: *Deleted

## 2017-11-18 ENCOUNTER — Ambulatory Visit: Payer: Medicare Other

## 2017-11-18 ENCOUNTER — Ambulatory Visit (INDEPENDENT_AMBULATORY_CARE_PROVIDER_SITE_OTHER): Payer: Medicare Other | Admitting: Internal Medicine

## 2017-11-18 ENCOUNTER — Other Ambulatory Visit: Payer: Self-pay

## 2017-11-18 VITALS — BP 138/78 | HR 98 | Temp 98.1°F | Ht 65.0 in | Wt 220.7 lb

## 2017-11-18 DIAGNOSIS — S99921A Unspecified injury of right foot, initial encounter: Secondary | ICD-10-CM

## 2017-11-18 DIAGNOSIS — S99929A Unspecified injury of unspecified foot, initial encounter: Secondary | ICD-10-CM | POA: Insufficient documentation

## 2017-11-18 DIAGNOSIS — G4719 Other hypersomnia: Secondary | ICD-10-CM | POA: Diagnosis not present

## 2017-11-18 DIAGNOSIS — G43809 Other migraine, not intractable, without status migrainosus: Secondary | ICD-10-CM | POA: Diagnosis not present

## 2017-11-18 DIAGNOSIS — W230XXA Caught, crushed, jammed, or pinched between moving objects, initial encounter: Secondary | ICD-10-CM | POA: Diagnosis not present

## 2017-11-18 DIAGNOSIS — G43909 Migraine, unspecified, not intractable, without status migrainosus: Secondary | ICD-10-CM | POA: Insufficient documentation

## 2017-11-18 DIAGNOSIS — R0683 Snoring: Secondary | ICD-10-CM | POA: Insufficient documentation

## 2017-11-18 HISTORY — DX: Unspecified injury of unspecified foot, initial encounter: S99.929A

## 2017-11-18 HISTORY — DX: Other hypersomnia: G47.19

## 2017-11-18 MED ORDER — PROMETHAZINE HCL 25 MG PO TABS
25.0000 mg | ORAL_TABLET | Freq: Once | ORAL | Status: AC
  Administered 2017-11-18: 25 mg via ORAL

## 2017-11-18 MED ORDER — KETOROLAC TROMETHAMINE 30 MG/ML IJ SOLN
30.0000 mg | Freq: Once | INTRAMUSCULAR | Status: AC
  Administered 2017-11-18: 30 mg via INTRAMUSCULAR

## 2017-11-18 MED ORDER — PROCHLORPERAZINE MALEATE 5 MG PO TABS: 5.0000 mg | ORAL_TABLET | Freq: Four times a day (QID) | ORAL | Status: DC | PRN

## 2017-11-18 NOTE — Assessment & Plan Note (Signed)
Toe: Assessment: Patient stated that she got her toe caught in a chair leg on Thanksgiving. It was red, swollen, tender, blue but now just mildly tender.   Plan: Uses the Diclofenac sodium tablets for the pain in her food and head. Continue to watch, she is to call if it worsens or fails to improve in two weeks

## 2017-11-18 NOTE — Assessment & Plan Note (Addendum)
Headaches: vs migraines Assessment: Can not tolerate caffeine, soda, ASA,  Typically takes a migraine cocktail at the ED which resolves symptoms.  Does not recall any correlation between foods and the HA, including cheese, breads, chocolates, EtOH.  Plan: Toradol 30mg  IV today, promethazine 25mg  PO

## 2017-11-18 NOTE — Assessment & Plan Note (Signed)
  Pauses in breathing at night: Assessment: Snores loudly, had adenoids removed at young age Patient stated that he mother says she takes a deep breath, holds this for some time, and then breaths again. Will accumulate a large amount of saliva as well. Denied falling asleep while sitting and talking, driving, watching TV.  Attested to extreme sleepiness, takes 1200mg  seroquil to sleep at night,  Has not had a sleep study in the past and denied family history of apnea.  Given history of depression, daytime headaches and symptoms above would like to rule out OSA as it is higher on the differential .  Plan: Could consider a sleep study for OSA??? Will defer this to her PCP's best judgement.  Sleep study for OSA evaluation

## 2017-11-18 NOTE — Patient Instructions (Signed)
We have given you a high dose NSAID known as Toradol today for the headache as well as an antinausea medication that in conjunction greatly reduces the pain. This can cause sleepiness so please refrain from driving today. Also, please do not take any other NSAIDs including ibuprofen, advil, motrin, diclofenac sodium etc.  We have placed a referral for a sleep study and will get in touch with you as soon as we know something.  If at any time you have any additional questions please call us.  FOLLOW-UP INSTRUCTIONS When: at  Your PCP, Dr. Alcario Drought next available appointment For: A routine visit What to bring: medications

## 2017-11-18 NOTE — Progress Notes (Signed)
   CC: migraine   HPI:  [name] is a 46 y.o. female who presents today for evaluation of a headache, not breathing at night, and pain in the fifth digit of the right foot.   Toe: Assessment: Patient stated that she got her toe caught ina chair leg on Thanksgiving. It was red, swollen, tender, blue but now just mildly tender.   Plan: Uses the Diclofenac sodium tablets for the pain in her food and head. Continue to watch, she is to call if it worsens or fails to improve in two weeks  Headaches: vs migraines Assessment: Can not tolerate caffeine, soda, ASA,  Typically takes a migraine cocktail at the ED which resolves symptoms.  Does not recall any correlation between foods and the HA, including cheese, breads, chocolates, EtOH.  Plan: Toradol 30mg  IV today, promethazine 25mg  PO   Pauses in breathing at night: Assessment: Snores loudly, had adenoids removed at young age Patient stated that he mother says she takes a deep breath, holds this for some time, and then breaths again. Will accumulate a large amount of saliva as well. Denied falling asleep while sitting and talking, driving, watching TV.  Attested to extreme sleepiness, takes 1200mg  seroquil to sleep at night,  Has not had a sleep study in the past and denied family history of apnea.   Plan: Could consider a sleep study for OSA??? Will defer this to her PCP's best judgement.  Sleep study for OSA evaluation   Past Medical History:  Diagnosis Date  . Anemia   . Anxiety   . Anxiety and depression   . Asthma   . Bipolar 1 disorder ([place])   . Depression   . GERD (gastroesophageal reflux disease)   . Heart murmur   . Hemorrhoids   . Hiatal hernia   . Hypertension   . Internal hemorrhoids    Review of Systems:  ROS negative except as per HPI.  Physical Exam:  Vitals:   11/18/17 1522  BP: 138/78  Pulse: 98  Temp: 98.1 F (36.7 C)  TempSrc: Oral  SpO2: 100%  Weight: 220 lb 11.2 oz (100.1 kg)    Height: 5\' 5"  (1.651 m)   Physical Exam  Constitutional: She appears well-nourished. No distress.  Eyes: Conjunctivae are normal.  Cardiovascular: Normal rate and regular rhythm.  No murmur heard. Pulmonary/Chest: Effort normal and breath sounds normal. No respiratory distress.  Abdominal: Soft. Bowel sounds are normal. She exhibits no distension. There is no tenderness.  Musculoskeletal: She exhibits edema (Mild, to the distal portion of the fifth digit of the right foot) and tenderness (To the distal portion of the fifth digit on the right foot).  Psychiatric: She has a normal mood and affect.    Assessment & Plan:   See Encounters Tab for problem based charting.  Patient seen with Dr. Daryll Drown

## 2017-11-20 NOTE — Progress Notes (Signed)
Internal Medicine Clinic Attending  I saw and evaluated the patient.  I personally confirmed the key portions of the history and exam documented by Dr. Berline Lopes and I reviewed pertinent patient test results.  The assessment, diagnosis, and plan were formulated together and I agree with the documentation in the resident's note.  We did order a split night study for Ms. Bouch which I think is appropriate.

## 2017-11-20 NOTE — Addendum Note (Signed)
Addended by: Nicola Girt on: 11/20/2017 08:56 AM   Modules accepted: Orders

## 2017-12-13 ENCOUNTER — Encounter (HOSPITAL_COMMUNITY): Payer: Self-pay | Admitting: *Deleted

## 2017-12-13 ENCOUNTER — Emergency Department (HOSPITAL_COMMUNITY)
Admission: EM | Admit: 2017-12-13 | Discharge: 2017-12-13 | Disposition: A | Discharge status: Home / Self Care | Payer: Medicare Other | Attending: Emergency Medicine | Admitting: Emergency Medicine

## 2017-12-13 DIAGNOSIS — F17210 Nicotine dependence, cigarettes, uncomplicated: Secondary | ICD-10-CM | POA: Insufficient documentation

## 2017-12-13 DIAGNOSIS — J45909 Unspecified asthma, uncomplicated: Secondary | ICD-10-CM | POA: Insufficient documentation

## 2017-12-13 DIAGNOSIS — Z79899 Other long term (current) drug therapy: Secondary | ICD-10-CM | POA: Diagnosis not present

## 2017-12-13 DIAGNOSIS — F410 Panic disorder [episodic paroxysmal anxiety] without agoraphobia: Secondary | ICD-10-CM | POA: Insufficient documentation

## 2017-12-13 DIAGNOSIS — F99 Mental disorder, not otherwise specified: Secondary | ICD-10-CM | POA: Diagnosis not present

## 2017-12-13 DIAGNOSIS — F419 Anxiety disorder, unspecified: Secondary | ICD-10-CM | POA: Diagnosis not present

## 2017-12-13 DIAGNOSIS — I10 Essential (primary) hypertension: Secondary | ICD-10-CM | POA: Diagnosis not present

## 2017-12-13 MED ORDER — ONDANSETRON HCL 4 MG PO TABS
4.0000 mg | ORAL_TABLET | Freq: Once | ORAL | Status: AC
  Administered 2017-12-13: 4 mg via ORAL
  Filled 2017-12-13: qty 1

## 2017-12-13 NOTE — ED Notes (Signed)
ED Provider at bedside. 

## 2017-12-13 NOTE — ED Triage Notes (Signed)
Per EMS, pt complains of anxiety attack. Pt was shopping at [facility] when the attack occurred. Pt is holding frozen pack of peas to her head, which she states is helping her anxiety. Pt denies pain.   BP 128/52 HR 98 O2 98% on RA

## 2017-12-13 NOTE — Discharge Instructions (Signed)
Follow up with your primary care doctor for reevaluation and further management of your anxiety.  Return to the ER if you develop cheat pain, difficulty breathing, persistent vomiting, or any new or worsening symptoms.

## 2017-12-13 NOTE — ED Provider Notes (Signed)
[facility] DEPT Provider Note   CSN: [id number] Arrival date & time: 12/13/17  3614     History   Chief Complaint Chief Complaint  Patient presents with  . Panic Attack    HPI Jasmine Jackson is a 46 y.o. female presenting for evaluation of anxiety.  Patient states that she had a panic attack earlier this morning.  She does not know what brought it on.  She states it felt typical of her panic attacks.  She had acute onset shortness of breath during her panic attack.  Currently, she denies shortness of breath.  She reports some residual nausea and tiredness.  She states this is typical for her post-panic attack.  She denies fever, chills, cough, chest pain, shortness of breath, vomiting, abdominal pain.  She states she takes medicine for anxiety invention and she used to be on Klonopin, but is no longer on any benzos and does not want to take any.   HPI  Past Medical History:  Diagnosis Date  . Anemia   . Anxiety   . Anxiety and depression   . Asthma   . Bipolar 1 disorder ([place])   . Depression   . GERD (gastroesophageal reflux disease)   . Heart murmur   . Hemorrhoids   . Hiatal hernia   . Hypertension   . Internal hemorrhoids     Patient Active Problem List   Diagnosis Date Noted  . Toe injury 11/18/2017  . Migraine 11/18/2017  . Excessive daytime sleepiness 11/18/2017  . Colitis 03/06/2017  . Vaginal discharge 12/25/2016  . Hot flashes 11/27/2016  . Tobacco use  10/18/2016  . Panic attacks 09/09/2016  . Health care maintenance 01/11/2016  . Low back pain 12/02/2015  . Hypertension 04/24/2015  . Muscle spasm 04/24/2015  . Bipolar disorder ([place]) 04/16/2014  . History of gout 04/01/2014  . Internal hemorrhoids without complication 43/15/4008  . Irritable bowel syndrome 12/08/2013  . Esophageal reflux 12/08/2013    Past Surgical History:  Procedure Laterality Date  . ABDOMINAL HYSTERECTOMY     fibroids  . HEMORRHOID SURGERY     . INGUINAL HERNIA REPAIR    . TONSILLECTOMY    . TUBAL LIGATION      OB History    No data available       Home Medications    Prior to Admission medications   Medication Sig Start Date End Date Taking? Authorizing Provider  albuterol (PROVENTIL HFA;VENTOLIN HFA) 108 (90 Base) MCG/ACT inhaler Inhale 2 puffs into the lungs every 6 (six) hours as needed for wheezing or shortness of breath. 10/18/16  Yes Jule Ser, DO  amLODipine (NORVASC) 5 MG tablet Take 1 tablet (5 mg total) by mouth daily. 08/14/17 08/14/18 Yes Jule Ser, DO  busPIRone (BUSPAR) 15 MG tablet Take 30 mg by mouth 2 (two) times daily.   Yes [provider]  colchicine (COLCRYS) 0.6 MG tablet Take 1 tablet (0.6 mg total) by mouth daily. Patient taking differently: Take 0.6 mg by mouth daily as needed (gout.).  11/29/15  Yes Hoyt Koch, MD  diclofenac sodium (VOLTAREN) 1 % GEL Apply 2 g topically 4 (four) times daily. 05/01/17  Yes Jule Ser, DO  dicyclomine (BENTYL) 20 MG tablet Take 0.5 tablets (10 mg total) by mouth 4 (four) times daily -  before meals and at bedtime. 08/14/17  Yes Jule Ser, DO  FLUoxetine (PROZAC) 40 MG capsule Take 80 mg by mouth every morning.    Yes [provider]  gabapentin (NEURONTIN) 800 MG tablet Take 800 mg by mouth 3 (three) times daily. 04/01/16  Yes [provider]  hydrochlorothiazide (HYDRODIURIL) 25 MG tablet TAKE 1 TABLET BY MOUTH DAILY 07/16/17  Yes Jule Ser, DO  hydrOXYzine (ATARAX/VISTARIL) 50 MG tablet Take 50 mg by mouth 3 (three) times daily. 02/13/17  Yes [provider]  nicotine (NICODERM CQ - DOSED IN MG/24 HOURS) 14 mg/24hr patch RX #1 Weeks 1-6: 14 mg x 1 patch daily. Wear for 24 hours. If you have sleep disturbances, remove at bedtime. 05/16/17  Yes Jule Ser, DO  omeprazole (PRILOSEC) 40 MG capsule Take 1 capsule (40 mg total) by mouth daily. 10/18/16  Yes Jule Ser, DO  polyethylene glycol  (MIRALAX / GLYCOLAX) packet Take 17 g by mouth daily. 04/26/17  Yes Rice, Resa Miner, MD  QUEtiapine (SEROQUEL XR) 400 MG 24 hr tablet Take 1,200 mg by mouth at bedtime. 08/03/17  Yes [provider]  QUEtiapine (SEROQUEL) 400 MG tablet Take 200 mg by mouth daily. At 8 pm 10/10/17  Yes [provider]  ranitidine (ZANTAC) 300 MG tablet Take 1 tablet (300 mg total) by mouth daily. 08/14/17  Yes Jule Ser, DO  tiZANidine (ZANAFLEX) 2 MG tablet Take 1 tablet (2 mg total) by mouth every 8 (eight) hours as needed for muscle spasms. 10/18/16  Yes Jule Ser, DO  acetaminophen (TYLENOL) 500 MG tablet Take 2 tablets (1,000 mg total) by mouth every 8 (eight) hours as needed. Patient not taking: Reported on 12/13/2017 03/06/17 03/06/18  Norman Herrlich, MD  alum & mag hydroxide-simeth (MAALOX REGULAR STRENGTH) 200-200-20 MG/5ML suspension Take 30 mLs by mouth every 6 (six) hours as needed for indigestion or heartburn. Patient not taking: Reported on 12/13/2017 04/26/17   Collier Salina, MD  cyclobenzaprine (FLEXERIL) 10 MG tablet Take 1 tablet (10 mg total) by mouth at bedtime as needed for muscle spasms. Patient not taking: Reported on 12/13/2017 09/01/17   Robinson, [place] N, PA-C  nicotine (NICODERM CQ - DOSED IN MG/24 HR) 7 mg/24hr patch RX #2 Weeks 7-8: 7 mg x 1 patch dailyWear for 24 hours. If you have sleep disturbances, remove at bedtime.. Patient not taking: Reported on 12/13/2017 05/16/17   Jule Ser, DO  nicotine polacrilex (NICORETTE) 2 MG gum Pharmacist, dispense quantity sufficient. See notes below for instructions Patient not taking: Reported on 12/13/2017 05/16/17   Jule Ser, DO  ondansetron (ZOFRAN ODT) 4 MG disintegrating tablet Take 1 tablet (4 mg total) by mouth every 8 (eight) hours as needed for nausea or vomiting. Patient not taking: Reported on 12/13/2017 03/04/17   Montine Circle, PA-C  senna [facility]) 8.6 MG TABS tablet Take 2 tablets by mouth  daily Patient not taking: Reported on 12/13/2017 07/09/17   Jule Ser, DO    Family History Family History  Problem Relation Age of Onset  . Breast cancer Maternal Aunt        x4  . Stomach cancer Maternal Aunt   . Prostate cancer Maternal Uncle        x2  . Breast cancer Maternal Grandmother   . Celiac disease Mother   . Breast cancer Mother 12  . Breast cancer Paternal Grandmother   . Colon cancer Neg Hx   . Esophageal cancer Neg Hx   . Rectal cancer Neg Hx     Social History Social History   Tobacco Use  . Smoking status: Current Every Day Smoker    Packs/day: 0.25  Types: Cigarettes  . Smokeless tobacco: Never Used  . Tobacco comment: 8 CIG A DAY, USES A PATCH  Substance Use Topics  . Alcohol use: No    Alcohol/week: 0.0 oz  . Drug use: No     Allergies   Asa [aspirin]; Banana; and Morphine and related   Review of Systems Review of Systems  Respiratory: Positive for shortness of breath (Resolved).   Gastrointestinal: Positive for nausea.  Psychiatric/Behavioral: The patient is nervous/anxious (Resolved).      Physical Exam Updated Vital Signs BP 125/85 (BP Location: Left Arm)   Pulse 78   Temp (!) 97.5 F (36.4 C)   Resp 18   SpO2 99%   Physical Exam  Constitutional: She is oriented to person, place, and time. She appears well-developed and well-nourished. No distress.  HENT:  Head: Normocephalic and atraumatic.  Eyes: EOM are normal.  Neck: Normal range of motion.  Cardiovascular: Normal rate, regular rhythm and intact distal pulses.  Pulmonary/Chest: Effort normal and breath sounds normal. No respiratory distress. She has no wheezes.  Abdominal: Soft. She exhibits no distension and no mass. There is no tenderness. There is no rebound and no guarding.  Musculoskeletal: Normal range of motion.  Neurological: She is alert and oriented to person, place, and time.  Skin: Skin is warm and dry.  Psychiatric: She has a normal mood and affect.  Her speech is normal and behavior is normal. Thought content normal. Her mood appears not anxious.  Nursing note and vitals reviewed.    ED Treatments / Results  Labs (all labs ordered are listed, but only abnormal results are displayed) Labs Reviewed - No data to display  EKG  EKG Interpretation None       Radiology No results found.  Procedures Procedures (including critical care time)  Medications Ordered in ED Medications  ondansetron (ZOFRAN) tablet 4 mg (4 mg Oral Given 12/13/17 1201)     Initial Impression / Assessment and Plan / ED Course  I have reviewed the triage vital signs and the nursing notes.  Pertinent labs & imaging results that were available during my care of the patient were reviewed by me and considered in my medical decision making (see chart for details).     Pt presenting for evaluation after a panic attack.  Symptoms have resolved.  She has some residual nausea and tiredness.  She states this is normal for her.  Physical exam reassuring.  Discussed with patient that if symptoms feel like her typical post-panic attack symptoms, I can give her medicine for nausea and discharged home.  Alternative of blood work and urine for further evaluation and, patient declines blood work and further investigation.  Zofran and ginger ale given.  On reassessment, patient states her nausea is improved and she is tolerating ginger ale without difficulty.  Patient to follow-up with primary care for further management of anxiety.  At this time, patient appears safe for discharge.  Return precautions given.  Patient states she understands and agrees to plan.   Final Clinical Impressions(s) / ED Diagnoses   Final diagnoses:  Panic attack    ED Discharge Orders    None       Franchot Heidelberg, PA-C 12/13/17 [place], [place], DO 12/14/17 [phone]

## 2017-12-13 NOTE — ED Notes (Signed)
Ginger ale and warm blanket given to patient.

## 2017-12-13 NOTE — ED Notes (Signed)
Bed: [facility]4 Expected date:  Expected time:  Means of arrival:  Comments: 

## 2017-12-14 ENCOUNTER — Other Ambulatory Visit: Payer: Self-pay | Admitting: Internal Medicine

## 2017-12-14 MED ORDER — MAGNESIUM CITRATE PO SOLN: 0.5000 | Freq: Once | ORAL | 0 refills | Status: AC

## 2017-12-14 MED ORDER — ONDANSETRON HCL 4 MG PO TABS: 4.0000 mg | ORAL_TABLET | Freq: Three times a day (TID) | ORAL | 0 refills | Status: DC | PRN

## 2017-12-14 NOTE — Progress Notes (Signed)
   Reason for call:   I received a call from [name] at 9:15 PM indicating that she is feeling nauseated and constipated, her last bowel movement was 3 days ago which was very hard requiring a lot of straining.   Pertinent Data:   Patient was seen in the ED yesterday because of panic attack, she was nauseated during that visit and was given a Zofran which helped with her nausea, she was asking for a prescription of Zofran to deal with her current nausea.  According to patient she has an history of constipation and uses MiraLAX as needed, she did tried MiraLAX this time but it was not helping.  She denies any vomiting, fever, upper respiratory symptoms or abdominal pain.  According to patient in the past magnesium citrate mostly help with her back constipation.  She denies any sick contact.   Assessment / Plan / Recommendations:   Nausea most likely secondary to constipation.  Prescription for Zofran and magnesium citrate was sent to [facility] at [place].  She was also advised to take stool softeners regularly prevent bad constipation and drink plenty of fluids.  As always, pt is advised that if symptoms worsen or new symptoms arise, they should go to an urgent care facility or to to ER for further evaluation.   Jasmine Nimrod, MD   12/14/2017, 9:25 PM

## 2017-12-29 ENCOUNTER — Ambulatory Visit (HOSPITAL_BASED_OUTPATIENT_CLINIC_OR_DEPARTMENT_OTHER): Payer: Medicare Other | Attending: Internal Medicine | Admitting: Internal Medicine

## 2017-12-29 VITALS — Ht 66.0 in | Wt 220.0 lb

## 2017-12-29 DIAGNOSIS — G4719 Other hypersomnia: Secondary | ICD-10-CM | POA: Insufficient documentation

## 2017-12-29 DIAGNOSIS — Z79899 Other long term (current) drug therapy: Secondary | ICD-10-CM | POA: Insufficient documentation

## 2017-12-29 DIAGNOSIS — R0683 Snoring: Secondary | ICD-10-CM | POA: Insufficient documentation

## 2017-12-29 DIAGNOSIS — G4733 Obstructive sleep apnea (adult) (pediatric): Secondary | ICD-10-CM

## 2017-12-30 ENCOUNTER — Ambulatory Visit: Payer: Medicare Other | Admitting: Gastroenterology

## 2018-01-01 ENCOUNTER — Other Ambulatory Visit: Payer: Self-pay | Admitting: Internal Medicine

## 2018-01-01 MED ORDER — QUETIAPINE FUMARATE ER 400 MG PO TB24: 1200.0000 mg | ORAL_TABLET | Freq: Every day | ORAL | 0 refills | Status: DC

## 2018-01-01 MED ORDER — FLUOXETINE HCL 40 MG PO CAPS: 80.0000 mg | ORAL_CAPSULE | Freq: Every morning | ORAL | 0 refills | Status: DC

## 2018-01-01 NOTE — Telephone Encounter (Signed)
PATIENT CALLED WANTING REFILLS ON MEDICATIONS SHE HAS BEEN GETTING FROM ANOTHER DOCTOR, WHICH SHE HAS STOPPED SEEING. TRIED TO SCHEDULE HER AN APPT. TO COME AND BE SEEN, JUST WANTS A CALL BACK, NO APPT.

## 2018-01-01 NOTE — Telephone Encounter (Signed)
Pt requesting refill on Seroquel XR 400 mg and Prozac 40 mg which were prescribed by Noemi Chapel @[facility]. States she no longer goes there but will be going to [facility] who can not prescribe her meds until receive paperwork from prior facility. Emergency planning/management officer.

## 2018-01-02 ENCOUNTER — Ambulatory Visit: Payer: Medicare Other

## 2018-01-02 NOTE — Telephone Encounter (Signed)
Left message meds have been refilled.

## 2018-01-04 DIAGNOSIS — G4733 Obstructive sleep apnea (adult) (pediatric): Secondary | ICD-10-CM | POA: Diagnosis not present

## 2018-01-04 NOTE — Procedures (Signed)
  Patient Name: Jasmine Jackson, Jasmine Jackson Date: 12/29/2017 Gender: Female D.O.B: December 02, 1971 Age (years): 46 Referring Provider: Gilles Chiquito Height (inches): 66 Interpreting Physician: Baird Lyons MD, ABSM Weight (lbs): 220 RPSGT: Madelon Lips BMI: 36 MRN: [id number] Neck Size: 15.00 <br> <br> CLINICAL INFORMATION Sleep Study Type: NPSG  Indication for sleep study: Excessive Daytime Sleepiness  Epworth Sleepiness Score: 3  SLEEP STUDY TECHNIQUE As per the AASM Manual for the Scoring of Sleep and Associated Events v2.3 (April 2016) with a hypopnea requiring 4% desaturations.  The channels recorded and monitored were frontal, central and occipital EEG, electrooculogram (EOG), submentalis EMG (chin), nasal and oral airflow, thoracic and abdominal wall motion, anterior tibialis EMG, snore microphone, electrocardiogram, and pulse oximetry.  MEDICATIONS Medications self-administered by patient taken the night of the study : [place] The study was initiated at 10:45:04 PM and ended at 4:50:12 AM.  Sleep onset time was 28.2 minutes and the sleep efficiency was 78.6%. The total sleep time was 286.9 minutes.  Stage REM latency was N/A minutes.  The patient spent 7.84% of the night in stage N1 sleep, 92.16% in stage N2 sleep, 0.00% in stage N3 and 0.00% in REM.  Alpha intrusion was absent.  Supine sleep was 0.00%.  RESPIRATORY PARAMETERS The overall apnea/hypopnea index (AHI) was 1.0 per hour. There were 3 total apneas, including 3 obstructive, 0 central and 0 mixed apneas. There were 2 hypopneas and 4 RERAs.  The AHI during Stage REM sleep was N/A per hour.  AHI while supine was N/A per hour.  The mean oxygen saturation was 96.86%. The minimum SpO2 during sleep was 93.00%.  loud snoring was noted during this study.  CARDIAC DATA The 2 lead EKG demonstrated sinus rhythm. The mean heart rate was 75.75 beats per minute. Other EKG findings include: None.  LEG  MOVEMENT DATA The total PLMS were 0 with a resulting PLMS index of 0.00. Associated arousal with leg movement index was 0.0 .  IMPRESSIONS - No significant obstructive sleep apnea occurred during this study (AHI = 1.0/h). - No significant central sleep apnea occurred during this study (CAI = 0.0/h). - Minimal oxygen desaturation was noted during this study (Min O2 = 93.00%). - The patient snored with loud snoring volume. - No cardiac abnormalities were noted during this study. - Clinically significant periodic limb movements did not occur during sleep. No significant associated arousals.  DIAGNOSIS - Primary snoring  RECOMMENDATIONS - Chin strap, oral appliance, or treatment for nasal congestion might helps snoring. Sleep off flat of back. - Be careful with alcohol, sedatives and other CNS depressants that may worsen sleep apnea and disrupt normal sleep architecture. - Sleep hygiene should be reviewed to assess factors that may improve sleep quality. - Weight management and regular exercise should be initiated or continued if appropriate.  [Electronically signed] 01/04/2018 02:41 PM  Baird Lyons MD, [place], American Board of Sleep Medicine   NPI: [id number]                           [place], [place] [facility]  ELECTRONICALLY SIGNED ON:  01/04/2018, 2:37 PM [place] PH: [phone]   FX: [phone] [place]

## 2018-01-19 ENCOUNTER — Other Ambulatory Visit: Payer: Self-pay | Admitting: Internal Medicine

## 2018-01-21 ENCOUNTER — Ambulatory Visit (INDEPENDENT_AMBULATORY_CARE_PROVIDER_SITE_OTHER): Payer: Medicare Other | Admitting: Gastroenterology

## 2018-01-21 ENCOUNTER — Encounter: Payer: Self-pay | Admitting: Gastroenterology

## 2018-01-21 VITALS — BP 106/80 | HR 60 | Ht 65.0 in | Wt 212.0 lb

## 2018-01-21 DIAGNOSIS — K219 Gastro-esophageal reflux disease without esophagitis: Secondary | ICD-10-CM | POA: Diagnosis not present

## 2018-01-21 DIAGNOSIS — K581 Irritable bowel syndrome with constipation: Secondary | ICD-10-CM

## 2018-01-21 MED ORDER — LINACLOTIDE 145 MCG PO CAPS: 145.0000 ug | ORAL_CAPSULE | Freq: Every day | ORAL | 5 refills | Status: DC

## 2018-01-21 MED ORDER — OMEPRAZOLE 40 MG PO CPDR: 40.0000 mg | DELAYED_RELEASE_CAPSULE | Freq: Every day | ORAL | 11 refills | Status: DC

## 2018-01-21 NOTE — Patient Instructions (Signed)
We have sent the following medications to your pharmacy for you to pick up at your convenience: omeprazole and Linzess.   Start Linzess samples taking one capsule by mouth daily.   Patient advised to avoid spicy, acidic, citrus, chocolate, mints, fruit and fruit juices.  Limit the intake of caffeine, alcohol and Soda.  Don't exercise too soon after eating.  Don't lie down within 3-4 hours of eating.  Elevate the head of your bed.  Thank you for choosing me and [facility].  Pricilla Riffle. Dagoberto Ligas., MD., [facility] Regal

## 2018-01-21 NOTE — Progress Notes (Signed)
History of Present Illness: This is a 47 year old femal referred by Jule Ser, DO for the evaluation of heartburn, constipation abdominal pain. She has a history of IBS, GERD, internal hemorrhoid. She was evaluated at [facility] ED in 11/2017 for a panic attack.  She has chronic constipation with episodic abdominal bloating and pain.  Takes MiraLAX daily without results so she uses magnesium citrate about twice per week to induce a bowel movement.  She has frequent heartburn despite using ranitidine.  She notes dyspepsia after taking capsules despite drinking adequate amounts of water.  Abd/pelvic CT 02/2017 There is no bowel wall thickening or bowel obstruction. There is more fluid in the right colon than is generally seen. A degree of nonspecific colitis cannot be entirely excluded. There is no surrounding mesenteric thickening.  Appendix upper normal in size with oral contrast seen throughout the appendix. No periappendiceal region inflammation.  No renal or ureteral calculus.  No hydronephrosis.  Stable small right renal mass which in 2014 was evaluated by MR and found to represent a mildly complex cyst. No new renal lesions.  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  Mild aortic atherosclerosis.  No aneurysm.    EGD 03/2013 small HH otherwise normal Colon 03/2013 internal hemorrhoids, polypoid mucosa Allergies  Allergen Reactions  . Asa [Aspirin] Other (See Comments)    Heart flutters  . Banana Itching  . Morphine And Related Itching    Itching w/o rash   Outpatient Medications Prior to Visit  Medication Sig Dispense Refill  . acetaminophen (TYLENOL) 500 MG tablet Take 2 tablets (1,000 mg total) by mouth every 8 (eight) hours as needed. 100 tablet 2  . albuterol (PROVENTIL HFA;VENTOLIN HFA) 108 (90 Base) MCG/ACT inhaler Inhale 2 puffs into the lungs every 6 (six) hours as needed for wheezing or shortness of breath. 3 Inhaler 3  . alum & mag hydroxide-simeth (MAALOX  REGULAR STRENGTH) 200-200-20 MG/5ML suspension Take 30 mLs by mouth every 6 (six) hours as needed for indigestion or heartburn. 355 mL 0  . amLODipine (NORVASC) 5 MG tablet Take 1 tablet (5 mg total) by mouth daily. 90 tablet 1  . busPIRone (BUSPAR) 15 MG tablet Take 30 mg by mouth 2 (two) times daily.    . cyclobenzaprine (FLEXERIL) 10 MG tablet Take 1 tablet (10 mg total) by mouth at bedtime as needed for muscle spasms. 7 tablet 0  . diclofenac sodium (VOLTAREN) 1 % GEL Apply 2 g topically 4 (four) times daily. 1 Tube 1  . dicyclomine (BENTYL) 20 MG tablet Take 0.5 tablets (10 mg total) by mouth 4 (four) times daily -  before meals and at bedtime. 180 tablet 1  . FLUoxetine (PROZAC) 40 MG capsule Take 2 capsules (80 mg total) by mouth every morning. 60 capsule 0  . gabapentin (NEURONTIN) 800 MG tablet Take 800 mg by mouth 3 (three) times daily.    . hydrochlorothiazide (HYDRODIURIL) 25 MG tablet TAKE 1 TABLET BY MOUTH DAILY 90 tablet 1  . hydrOXYzine (ATARAX/VISTARIL) 50 MG tablet Take 50 mg by mouth 3 (three) times daily.    . nicotine (NICODERM CQ - DOSED IN MG/24 HOURS) 14 mg/24hr patch RX #1 Weeks 1-6: 14 mg x 1 patch daily. Wear for 24 hours. If you have sleep disturbances, remove at bedtime. 42 patch 0  . nicotine polacrilex (NICORETTE) 2 MG gum Pharmacist, dispense quantity sufficient. See notes below for instructions 880 each 0  . ondansetron (ZOFRAN ODT) 4 MG disintegrating tablet  Take 1 tablet (4 mg total) by mouth every 8 (eight) hours as needed for nausea or vomiting. 10 tablet 0  . ondansetron (ZOFRAN) 4 MG tablet Take 1 tablet (4 mg total) by mouth every 8 (eight) hours as needed for nausea or vomiting. 20 tablet 0  . polyethylene glycol (MIRALAX / GLYCOLAX) packet Take 17 g by mouth daily. 14 each 0  . QUEtiapine (SEROQUEL XR) 400 MG 24 hr tablet Take 3 tablets (1,200 mg total) by mouth at bedtime. 90 tablet 0  . QUEtiapine (SEROQUEL) 400 MG tablet Take 200 mg by mouth daily. At 8 pm   2  . ranitidine (ZANTAC) 300 MG tablet Take 1 tablet (300 mg total) by mouth daily. 90 tablet 1  . senna (SENOKOT) 8.6 MG TABS tablet Take 2 tablets by mouth daily 180 tablet 0  . tiZANidine (ZANAFLEX) 2 MG tablet Take 1 tablet (2 mg total) by mouth every 8 (eight) hours as needed for muscle spasms. 270 tablet 0  . colchicine (COLCRYS) 0.6 MG tablet Take 1 tablet (0.6 mg total) by mouth daily. (Patient taking differently: Take 0.6 mg by mouth daily as needed (gout.). ) 90 tablet 3  . nicotine (NICODERM CQ - DOSED IN MG/24 HR) 7 mg/24hr patch RX #2 Weeks 7-8: 7 mg x 1 patch dailyWear for 24 hours. If you have sleep disturbances, remove at bedtime.. (Patient not taking: Reported on 12/13/2017) 14 patch 0  . omeprazole (PRILOSEC) 40 MG capsule Take 1 capsule (40 mg total) by mouth daily. 90 capsule 1   No facility-administered medications prior to visit.    Past Medical History:  Diagnosis Date  . Anemia   . Anxiety   . Anxiety and depression   . Asthma   . Bipolar 1 disorder ([place])   . Depression   . GERD (gastroesophageal reflux disease)   . Heart murmur   . Hemorrhoids   . Hiatal hernia   . Hypertension   . Internal hemorrhoids    Past Surgical History:  Procedure Laterality Date  . ABDOMINAL HYSTERECTOMY     fibroids  . HEMORRHOID SURGERY    . INGUINAL HERNIA REPAIR    . TONSILLECTOMY    . TUBAL LIGATION     Social History   Socioeconomic History  . Marital status: Single    Spouse name: None  . Number of children: 1  . Years of education: None  . Highest education level: None  Social Needs  . Financial resource strain: None  . Food insecurity - worry: None  . Food insecurity - inability: None  . Transportation needs - medical: None  . Transportation needs - non-medical: None  Occupational History  . Occupation: Disability  Tobacco Use  . Smoking status: Current Every Day Smoker    Packs/day: 0.25    Types: Cigarettes  . Smokeless tobacco: Never Used  . Tobacco  comment: 8 CIG A DAY, USES A PATCH  Substance and Sexual Activity  . Alcohol use: No    Alcohol/week: 0.0 oz  . Drug use: No  . Sexual activity: Yes    Birth control/protection: Condom    Comment: both  Other Topics Concern  . None  Social History Narrative  . None   Family History  Problem Relation Age of Onset  . Breast cancer Maternal Aunt        x4  . Stomach cancer Maternal Aunt   . Prostate cancer Maternal Uncle        x2  .  Breast cancer Maternal Grandmother   . Celiac disease Mother   . Breast cancer Mother 60  . Breast cancer Paternal Grandmother   . Colon cancer Neg Hx   . Esophageal cancer Neg Hx   . Rectal cancer Neg Hx       Review of Systems: Pertinent positive and negative review of systems were noted in the above HPI section. All other review of systems were otherwise negative.   Physical Exam: General: Well developed, well nourished, no acute distress Head: Normocephalic and atraumatic Eyes:  sclerae anicteric, EOMI Ears: Normal auditory acuity Mouth: No deformity or lesions Neck: Supple, no masses or thyromegaly Lungs: Clear throughout to auscultation Heart: Regular rate and rhythm; no murmurs, rubs or bruits Abdomen: Soft, non tender and non distended. No masses, hepatosplenomegaly or hernias noted. Normal Bowel sounds Musculoskeletal: Symmetrical with no gross deformities  Skin: No lesions on visible extremities Pulses:  Normal pulses noted Extremities: No clubbing, cyanosis, edema or deformities noted Neurological: Alert oriented x 4, grossly nonfocal Cervical Nodes:  No significant cervical adenopathy Inguinal Nodes: No significant inguinal adenopathy Psychological:  Alert and cooperative. Normal mood and affect  Assessment and Recommendations:  1. IBS-C.  Begin Linzess 145 mcg daily.  Reduce or discontinue magnesium citrate use.  MiraLAX once or twice daily as needed.  Continue dicyclomine 10 mg 4 times daily as needed.  Patient is advised  to call prior to her next office visit if she is not having complete bowel movements at least every other day or if her abdominal pain and bloating persist. REV in 6 weeks.   2. GERD.  Follow standard antireflux measures.  Discontinue ranitidine.  Begin omeprazole 40 mg daily.   cc: Jule Ser, DO [address] [place], [place] [id number]

## 2018-01-23 ENCOUNTER — Telehealth: Payer: Self-pay

## 2018-01-23 NOTE — Telephone Encounter (Signed)
Requesting sleep study result. Please call pt back.

## 2018-01-26 NOTE — Telephone Encounter (Signed)
Patient called and given results of her sleep study.  Thanks!

## 2018-02-05 DIAGNOSIS — Z5181 Encounter for therapeutic drug level monitoring: Secondary | ICD-10-CM | POA: Diagnosis not present

## 2018-02-05 DIAGNOSIS — F4310 Post-traumatic stress disorder, unspecified: Secondary | ICD-10-CM | POA: Diagnosis not present

## 2018-02-05 DIAGNOSIS — F17200 Nicotine dependence, unspecified, uncomplicated: Secondary | ICD-10-CM | POA: Diagnosis not present

## 2018-02-16 ENCOUNTER — Other Ambulatory Visit: Payer: Self-pay | Admitting: Internal Medicine

## 2018-02-16 DIAGNOSIS — K219 Gastro-esophageal reflux disease without esophagitis: Secondary | ICD-10-CM

## 2018-02-16 NOTE — Telephone Encounter (Signed)
Next appt scheduled  3/14 with PCP. 

## 2018-02-27 ENCOUNTER — Ambulatory Visit: Payer: Self-pay | Admitting: Gastroenterology

## 2018-03-05 ENCOUNTER — Ambulatory Visit (INDEPENDENT_AMBULATORY_CARE_PROVIDER_SITE_OTHER): Payer: Medicare Other | Admitting: Internal Medicine

## 2018-03-05 ENCOUNTER — Other Ambulatory Visit: Payer: Self-pay

## 2018-03-05 ENCOUNTER — Encounter: Payer: Self-pay | Admitting: Internal Medicine

## 2018-03-05 VITALS — BP 136/80 | HR 88 | Temp 98.7°F | Ht 66.0 in | Wt 213.4 lb

## 2018-03-05 DIAGNOSIS — J029 Acute pharyngitis, unspecified: Secondary | ICD-10-CM

## 2018-03-05 DIAGNOSIS — Z79899 Other long term (current) drug therapy: Secondary | ICD-10-CM | POA: Diagnosis not present

## 2018-03-05 DIAGNOSIS — I10 Essential (primary) hypertension: Secondary | ICD-10-CM | POA: Diagnosis not present

## 2018-03-05 DIAGNOSIS — M25562 Pain in left knee: Secondary | ICD-10-CM

## 2018-03-05 DIAGNOSIS — M25569 Pain in unspecified knee: Secondary | ICD-10-CM | POA: Insufficient documentation

## 2018-03-05 DIAGNOSIS — F319 Bipolar disorder, unspecified: Secondary | ICD-10-CM

## 2018-03-05 DIAGNOSIS — F17210 Nicotine dependence, cigarettes, uncomplicated: Secondary | ICD-10-CM | POA: Diagnosis not present

## 2018-03-05 DIAGNOSIS — R0683 Snoring: Secondary | ICD-10-CM

## 2018-03-05 NOTE — Assessment & Plan Note (Signed)
BP: 136/80.  She is on HCTZ 25mg  daily and amlodipine 5mg  daily.  She is tolerating both well without side effects.   Plan: - Continue current medications as above - BMET today

## 2018-03-05 NOTE — Assessment & Plan Note (Signed)
She has had some left knee pain recently that is relieved with topical voltaren gel that she borrows from her mother.    Plan: - discussed trial of OTC lidocaine cream to relieve symptoms - can consider prescription for voltaren in future if needed.

## 2018-03-05 NOTE — Assessment & Plan Note (Signed)
She now follows with [facility] for her mental health needs and medication management.  Plan: - continue current regimen of gabapentin, seroquel, prozac, and vistaril.  There have been no changes to her medication per her report.

## 2018-03-05 NOTE — Assessment & Plan Note (Signed)
Her sleep study did not demonstrate significant OSA.  It diagnosed primary snoring.  We discussed potential recommendations for this based on the sleep study report.   Plan:  - Chin strap, oral appliance, or treatment for nasal congestion might helps snoring. Sleep off flat of back. - Be careful with alcohol, sedatives and other CNS depressants that may worsen sleep apnea and disrupt normal sleep architecture. - Sleep hygiene should be reviewed to assess factors that may improve sleep quality. - Weight management and regular exercise should be initiated or continued if appropriate.

## 2018-03-05 NOTE — Progress Notes (Signed)
   CC: here for f/u HTN  HPI:  [name] is a 47 y.o. woman with a past medical history listed below here today for follow up of her HTN.  For details of today's visit and the status of her chronic medical issues please refer to the assessment and plan.   Past Medical History:  Diagnosis Date  . Anemia   . Anxiety   . Anxiety and depression   . Asthma   . Bipolar 1 disorder ([place])   . Depression   . Excessive daytime sleepiness 11/18/2017  . GERD (gastroesophageal reflux disease)   . Heart murmur   . Hemorrhoids   . Hiatal hernia   . Hypertension   . Internal hemorrhoids    Review of Systems:  Review of Systems  Constitutional: Negative for chills and fever.  HENT: Positive for sore throat.   Respiratory: Negative for shortness of breath.   Cardiovascular: Negative for chest pain.  Psychiatric/Behavioral: Negative for depression. The patient is not nervous/anxious.      Physical Exam:  Vitals:   03/05/18 1314  BP: 136/80  Pulse: 88  Temp: 98.7 F (37.1 C)  TempSrc: Oral  SpO2: 98%  Weight: 213 lb 6.4 oz (96.8 kg)  Height: 5\' 6"  (1.676 m)   Physical Exam  Constitutional: She is oriented to person, place, and time and well-developed, well-nourished, and in no distress.  HENT:  Head: Normocephalic and atraumatic.  Mouth/Throat: Oropharynx is clear and moist. No oropharyngeal exudate.  Eyes: Conjunctivae and EOM are normal.  Cardiovascular: Normal rate, regular rhythm and normal heart sounds.  Pulmonary/Chest: Effort normal and breath sounds normal. She has no wheezes. She has no rales.  Lymphadenopathy:    She has no cervical adenopathy.  Neurological: She is alert and oriented to person, place, and time.  Skin: Skin is warm and dry.  Psychiatric: Mood and affect normal.    Assessment & Plan:   See Encounters Tab for problem based charting.  Patient discussed with Dr. Eppie Gibson.  Hypertension  BP: 136/80.  She is on HCTZ 25mg  daily and amlodipine  5mg  daily.  She is tolerating both well without side effects.   Plan: - Continue current medications as above - BMET today   Bipolar disorder ([place]) She now follows with [facility] for her mental health needs and medication management.  Plan: - continue current regimen of gabapentin, seroquel, prozac, and vistaril.  There have been no changes to her medication per her report.   Knee pain She has had some left knee pain recently that is relieved with topical voltaren gel that she borrows from her mother.    Plan: - discussed trial of OTC lidocaine cream to relieve symptoms - can consider prescription for voltaren in future if needed.   Snoring Her sleep study did not demonstrate significant OSA.  It diagnosed primary snoring.  We discussed potential recommendations for this based on the sleep study report.   Plan:  - Chin strap, oral appliance, or treatment for nasal congestion might helps snoring. Sleep off flat of back. - Be careful with alcohol, sedatives and other CNS depressants that may worsen sleep apnea and disrupt normal sleep architecture. - Sleep hygiene should be reviewed to assess factors that may improve sleep quality. - Weight management and regular exercise should be initiated or continued if appropriate.

## 2018-03-05 NOTE — Patient Instructions (Signed)
Thank you for coming to see me today. It was a pleasure. Today we talked about:   Your blood pressure looks good.  Keep up the good work !  Try some lidocaine cream over the counter for your knee pain that has responded well to the other gel you have been using  Please follow-up with [place] in 6 months.   If you have any questions or concerns, please do not hesitate to call the office at [phone].  Take Care,   Jule Ser, DO

## 2018-03-06 ENCOUNTER — Encounter: Payer: Self-pay | Admitting: Internal Medicine

## 2018-03-06 LAB — BMP8+ANION GAP
ANION GAP: 16 mmol/L (ref 10.0–18.0)
BUN/Creatinine Ratio: 13 (ref 9–23)
BUN: 11 mg/dL (ref 6–24)
CO2: 24 mmol/L (ref 20–29)
Calcium: 9.9 mg/dL (ref 8.7–10.2)
Chloride: 100 mmol/L (ref 96–106)
Creatinine, Ser: 0.85 mg/dL (ref 0.57–1.00)
GFR calc Af Amer: 94 mL/min/{1.73_m2} (ref >59)
GFR, EST NON AFRICAN AMERICAN: 82 mL/min/{1.73_m2} (ref >59)
Glucose: 82 mg/dL (ref 65–99)
Potassium: 3.5 mmol/L (ref 3.5–5.2)
SODIUM: 140 mmol/L (ref 134–144)

## 2018-03-09 NOTE — Progress Notes (Signed)
Case discussed with Dr. Wallace soon after the resident saw the patient.  We reviewed the resident's history and exam and pertinent patient test results.  I agree with the assessment, diagnosis and plan of care documented in the resident's note. 

## 2018-03-25 ENCOUNTER — Other Ambulatory Visit: Payer: Self-pay | Admitting: *Deleted

## 2018-03-26 ENCOUNTER — Other Ambulatory Visit: Payer: Self-pay | Admitting: Internal Medicine

## 2018-03-26 MED ORDER — ONDANSETRON 4 MG PO TBDP: 4.0000 mg | ORAL_TABLET | Freq: Three times a day (TID) | ORAL | 0 refills | Status: DC | PRN

## 2018-04-07 ENCOUNTER — Other Ambulatory Visit: Payer: Self-pay | Admitting: *Deleted

## 2018-04-30 DIAGNOSIS — F3181 Bipolar II disorder: Secondary | ICD-10-CM | POA: Diagnosis not present

## 2018-04-30 DIAGNOSIS — Z5181 Encounter for therapeutic drug level monitoring: Secondary | ICD-10-CM | POA: Diagnosis not present

## 2018-04-30 DIAGNOSIS — F422 Mixed obsessional thoughts and acts: Secondary | ICD-10-CM | POA: Diagnosis not present

## 2018-04-30 DIAGNOSIS — F410 Panic disorder [episodic paroxysmal anxiety] without agoraphobia: Secondary | ICD-10-CM | POA: Diagnosis not present

## 2018-04-30 DIAGNOSIS — F17200 Nicotine dependence, unspecified, uncomplicated: Secondary | ICD-10-CM | POA: Diagnosis not present

## 2018-04-30 DIAGNOSIS — F4310 Post-traumatic stress disorder, unspecified: Secondary | ICD-10-CM | POA: Diagnosis not present

## 2018-05-22 ENCOUNTER — Ambulatory Visit (INDEPENDENT_AMBULATORY_CARE_PROVIDER_SITE_OTHER): Payer: Medicare Other | Admitting: Internal Medicine

## 2018-05-22 ENCOUNTER — Encounter: Payer: Self-pay | Admitting: Internal Medicine

## 2018-05-22 ENCOUNTER — Other Ambulatory Visit: Payer: Self-pay

## 2018-05-22 VITALS — BP 135/82 | HR 90 | Temp 98.3°F | Ht 66.0 in | Wt 202.2 lb

## 2018-05-22 DIAGNOSIS — F17210 Nicotine dependence, cigarettes, uncomplicated: Secondary | ICD-10-CM

## 2018-05-22 DIAGNOSIS — K582 Mixed irritable bowel syndrome: Secondary | ICD-10-CM | POA: Diagnosis not present

## 2018-05-22 DIAGNOSIS — K649 Unspecified hemorrhoids: Secondary | ICD-10-CM

## 2018-05-22 DIAGNOSIS — R197 Diarrhea, unspecified: Secondary | ICD-10-CM | POA: Insufficient documentation

## 2018-05-22 DIAGNOSIS — K580 Irritable bowel syndrome with diarrhea: Secondary | ICD-10-CM | POA: Diagnosis present

## 2018-05-22 NOTE — Progress Notes (Signed)
   CC: Abdominal pain  HPI:JasmineADDILYNNE Jackson is a 47 y.o. female who presents today for evaluation of abdominal pain.  She is individual A/P for current plan.  Abdominal pain: Of 5 days duration.  Not associated with fever, myalgias, headaches, visual changes, chest pain, joint pain, hematuria, dysuria.  Associated with nonbloody emesis, nausea, hematochezia and diarrhea.  Her emesis is described as simple gastrointestinal contents minus blood.  Diarrhea is described as loose watery copious stool that is non-formed.  There is associated mild discoloration of the toilet bowl water with a red tent and occasional blood-streaked stool which is not new but slightly increased since onset. She denied known sick contacts.  She has been evaluated in the past with upper endoscopy and colonoscopy and diagnosed with IBS. I feel her current presentation is most consistent with IBS flare and that the blood-streaked stools secondary to her severe hemorrhoids. But given the patient's history I am concerned about infectious versus autoimmune etiology as well and will evaluate for potential anemia today with a CBC.   Plan: I recommended sufficient for oral rehydration P.o. intake as tolerated Strict return precautions given CBC Continue ondansetron as needed for nausea Return in 3 to 4 days if her symptoms worsen or fail to improve   Past Medical History:  Diagnosis Date  . Anemia   . Anxiety   . Anxiety and depression   . Asthma   . Bipolar 1 disorder ([place])   . Depression   . Excessive daytime sleepiness 11/18/2017  . GERD (gastroesophageal reflux disease)   . Heart murmur   . Hemorrhoids   . Hiatal hernia   . Hypertension   . Internal hemorrhoids    Review of Systems: ROS negative except as per HPI.  Physical Exam:  There were no vitals filed for this visit. Physical Exam  Constitutional: She is oriented to person, place, and time. She appears well-developed and well-nourished. No distress.    HENT:  Head: Normocephalic and atraumatic.  Eyes: Conjunctivae and EOM are normal.  Neck: Normal range of motion. Neck supple.  Cardiovascular: Normal rate and regular rhythm.  No murmur heard. Pulmonary/Chest: Effort normal and breath sounds normal. No stridor. No respiratory distress.  Abdominal: Soft. Bowel sounds are normal. She exhibits no shifting dullness, no distension and no ascites. There is tenderness in the right lower quadrant, epigastric area and left lower quadrant. There is no rigidity, no rebound, no guarding, no CVA tenderness and negative Murphy's sign.  Musculoskeletal: She exhibits no edema or tenderness.  Neurological: She is alert and oriented to person, place, and time.  Skin: Skin is warm. Capillary refill takes less than 2 seconds. She is not diaphoretic.  Psychiatric: She has a normal mood and affect.    Assessment & Plan:   See Encounters Tab for problem based charting.  Patient discussed with Dr. Angelia Mould

## 2018-05-22 NOTE — Assessment & Plan Note (Signed)
  Abdominal pain: Of 5 days duration.  Not associated with fever, myalgias, headaches, visual changes, chest pain, joint pain, hematuria, dysuria.  Associated with nonbloody emesis, nausea, hematochezia and diarrhea.  Her emesis is described as simple gastrointestinal contents minus blood.  Diarrhea is described as loose watery copious stool that is non-formed.  There is associated mild discoloration of the toilet bowl water with a red tent and occasional blood-streaked stool which is not new but slightly increased since onset. She denied known sick contacts.  She has been evaluated in the past with upper endoscopy and colonoscopy and diagnosed with IBS. I feel her current presentation is most consistent with IBS flare and that the blood-streaked stools secondary to her severe hemorrhoids. But given the patient's history I am concerned about infectious versus autoimmune etiology as well and will evaluate for potential anemia today with a CBC.   Plan: I recommended sufficient for oral rehydration P.o. intake as tolerated Strict return precautions given CBC Continue ondansetron as needed for nausea Return in 3 to 4 days if her symptoms worsen or fail to improve

## 2018-05-22 NOTE — Patient Instructions (Addendum)
FOLLOW-UP INSTRUCTIONS When: if symptoms worsen or fail to improve or if your symptoms continue into Monday without improvement  Thank you for your visit to the [facility] internal medicine clinic today.  I have ordered a lab and will call you with any concerning results when it returns.  At this time I strongly encourage you to stay hydrated drinking at least 1 to 2 ounces of fluid every 10 to 15 minutes slowly to increase tolerance and decrease losses.  In addition I would avoid inciting events and large meals.   If you develop any concerning symptoms such as but not limited to fever, chills, muscle aches or severely worsening diarrhea or vomiting please notify us and return to the clinic.

## 2018-05-23 LAB — CBC WITH DIFFERENTIAL/PLATELET
BASOS: 0 %
Basophils Absolute: 0 10*3/uL (ref 0.0–0.2)
EOS (ABSOLUTE): 0.1 10*3/uL (ref 0.0–0.4)
EOS: 1 %
HEMOGLOBIN: 13 g/dL (ref 11.1–15.9)
Hematocrit: 39.3 % (ref 34.0–46.6)
IMMATURE GRANS (ABS): 0 10*3/uL (ref 0.0–0.1)
Immature Granulocytes: 0 %
LYMPHS: 27 %
Lymphocytes Absolute: 3.2 10*3/uL — ABNORMAL HIGH (ref 0.7–3.1)
MCH: 28.4 pg (ref 26.6–33.0)
MCHC: 33.1 g/dL (ref 31.5–35.7)
MCV: 86 fL (ref 79–97)
MONOCYTES: 7 %
Monocytes Absolute: 0.8 10*3/uL (ref 0.1–0.9)
NEUTROS ABS: 7.6 10*3/uL — AB (ref 1.4–7.0)
Neutrophils: 65 %
Platelets: 413 10*3/uL (ref 150–450)
RBC: 4.57 x10E6/uL (ref 3.77–5.28)
RDW: 13.7 % (ref 12.3–15.4)
WBC: 11.7 10*3/uL — ABNORMAL HIGH (ref 3.4–10.8)

## 2018-05-24 ENCOUNTER — Other Ambulatory Visit: Payer: Self-pay | Admitting: Internal Medicine

## 2018-05-24 DIAGNOSIS — K219 Gastro-esophageal reflux disease without esophagitis: Secondary | ICD-10-CM

## 2018-05-25 ENCOUNTER — Other Ambulatory Visit: Payer: Self-pay | Admitting: Internal Medicine

## 2018-05-28 NOTE — Progress Notes (Signed)
Internal Medicine Clinic Attending  Case discussed with Dr. Harbrecht at the time of the visit.  We reviewed the resident's history and exam and pertinent patient test results.  I agree with the assessment, diagnosis, and plan of care documented in the resident's note.   

## 2018-06-04 ENCOUNTER — Telehealth: Payer: Self-pay | Admitting: Internal Medicine

## 2018-06-04 DIAGNOSIS — F422 Mixed obsessional thoughts and acts: Secondary | ICD-10-CM | POA: Diagnosis not present

## 2018-06-04 DIAGNOSIS — F4310 Post-traumatic stress disorder, unspecified: Secondary | ICD-10-CM | POA: Diagnosis not present

## 2018-06-04 DIAGNOSIS — F410 Panic disorder [episodic paroxysmal anxiety] without agoraphobia: Secondary | ICD-10-CM | POA: Diagnosis not present

## 2018-06-04 DIAGNOSIS — F3181 Bipolar II disorder: Secondary | ICD-10-CM | POA: Diagnosis not present

## 2018-06-04 DIAGNOSIS — Z5181 Encounter for therapeutic drug level monitoring: Secondary | ICD-10-CM | POA: Diagnosis not present

## 2018-06-04 DIAGNOSIS — F17200 Nicotine dependence, unspecified, uncomplicated: Secondary | ICD-10-CM | POA: Diagnosis not present

## 2018-06-16 ENCOUNTER — Ambulatory Visit: Payer: Self-pay

## 2018-06-18 ENCOUNTER — Encounter: Payer: Self-pay | Admitting: Internal Medicine

## 2018-06-18 ENCOUNTER — Ambulatory Visit: Payer: Self-pay

## 2018-06-20 ENCOUNTER — Encounter: Payer: Self-pay | Admitting: *Deleted

## 2018-08-14 ENCOUNTER — Other Ambulatory Visit: Payer: Self-pay | Admitting: Internal Medicine

## 2018-09-09 ENCOUNTER — Other Ambulatory Visit: Payer: Self-pay | Admitting: Internal Medicine

## 2018-09-09 DIAGNOSIS — Z1231 Encounter for screening mammogram for malignant neoplasm of breast: Secondary | ICD-10-CM

## 2018-09-13 ENCOUNTER — Other Ambulatory Visit: Payer: Self-pay | Admitting: Gastroenterology

## 2018-09-15 DIAGNOSIS — F3181 Bipolar II disorder: Secondary | ICD-10-CM | POA: Diagnosis not present

## 2018-09-16 DIAGNOSIS — F3181 Bipolar II disorder: Secondary | ICD-10-CM | POA: Diagnosis not present

## 2018-09-21 ENCOUNTER — Other Ambulatory Visit: Payer: Self-pay

## 2018-09-21 ENCOUNTER — Encounter: Payer: Self-pay | Admitting: Internal Medicine

## 2018-09-21 ENCOUNTER — Ambulatory Visit (INDEPENDENT_AMBULATORY_CARE_PROVIDER_SITE_OTHER): Payer: Medicare Other | Admitting: Internal Medicine

## 2018-09-21 DIAGNOSIS — R234 Changes in skin texture: Secondary | ICD-10-CM | POA: Diagnosis not present

## 2018-09-21 DIAGNOSIS — F17210 Nicotine dependence, cigarettes, uncomplicated: Secondary | ICD-10-CM | POA: Diagnosis not present

## 2018-09-21 DIAGNOSIS — M79672 Pain in left foot: Secondary | ICD-10-CM | POA: Diagnosis not present

## 2018-09-21 DIAGNOSIS — L853 Xerosis cutis: Secondary | ICD-10-CM

## 2018-09-21 DIAGNOSIS — M79671 Pain in right foot: Secondary | ICD-10-CM | POA: Insufficient documentation

## 2018-09-21 NOTE — Assessment & Plan Note (Signed)
Patient with bil heel skin thickening and dryness associated with cracking. She has no PAD or DM, however with the thick cracking, there is concern for underlying infection. VS are stable, she has great pulses and current signs of infection on her exam.  Plan: --advised use of vaseline/aquaphor with socks each night --refer to podiatry

## 2018-09-21 NOTE — Patient Instructions (Signed)
For your heels we are referring your to podiatry.  In the mean time, try placing vasoline or aquaphor and putting on socks while your sleep.

## 2018-09-21 NOTE — Progress Notes (Signed)
   CC: heel pain  HPI:  Jasmine Jackson is a 47 y.o. with a PMH listed below presenting to clinic for bil heel pain.  Patient states that she noticed drying and thickening of her bil heels for the past 8-9 months and about 2 weeks of pain. She endorses cracking in her heels but denies drainage. She has tried vaseline and moisturizer with and without putting plastic bags over her feet without improvement in the dryness.   Please see problem based Assessment and Plan for status of patients chronic conditions.  Past Medical History:  Diagnosis Date  . Anemia   . Anxiety   . Anxiety and depression   . Asthma   . Bipolar 1 disorder ([place])   . Depression   . Excessive daytime sleepiness 11/18/2017  . GERD (gastroesophageal reflux disease)   . Heart murmur   . Hemorrhoids   . Hiatal hernia   . Hypertension   . Internal hemorrhoids   . Toe injury 11/18/2017    Review of Systems:   Per HPI  Physical Exam:  Vitals:   09/21/18 0906  BP: 129/73  Pulse: 95  Temp: 98.1 F (36.7 C)  TempSrc: Oral  SpO2: 100%  Weight: 210 lb 6.4 oz (95.4 kg)   GENERAL- alert, co-operative, appears as stated age, not in any distress. EXTREMITIES- pulse 2+, symmetric, no pedal edema. SKIN- Warm; bil heels with dry, thickened skin L>R. Right heel with cracks. No swelling, induration, erythema, warmth, or fluctuance apparent.  Assessment & Plan:   See Encounters Tab for problem based charting.   Patient discussed with Dr. Abelardo Diesel, MD Internal Medicine PGY-3

## 2018-09-22 DIAGNOSIS — F3181 Bipolar II disorder: Secondary | ICD-10-CM | POA: Diagnosis not present

## 2018-09-23 DIAGNOSIS — Z23 Encounter for immunization: Secondary | ICD-10-CM | POA: Diagnosis not present

## 2018-09-24 NOTE — Progress Notes (Signed)
Internal Medicine Clinic Attending  Case discussed with Dr. Svalina  at the time of the visit.  We reviewed the resident's history and exam and pertinent patient test results.  I agree with the assessment, diagnosis, and plan of care documented in the resident's note.  

## 2018-09-29 DIAGNOSIS — F3181 Bipolar II disorder: Secondary | ICD-10-CM | POA: Diagnosis not present

## 2018-10-05 ENCOUNTER — Ambulatory Visit (INDEPENDENT_AMBULATORY_CARE_PROVIDER_SITE_OTHER): Payer: Medicare Other | Admitting: Podiatry

## 2018-10-05 ENCOUNTER — Ambulatory Visit (INDEPENDENT_AMBULATORY_CARE_PROVIDER_SITE_OTHER): Payer: Medicare Other

## 2018-10-05 ENCOUNTER — Encounter: Payer: Self-pay | Admitting: Podiatry

## 2018-10-05 VITALS — BP 122/77 | HR 71

## 2018-10-05 DIAGNOSIS — M7661 Achilles tendinitis, right leg: Secondary | ICD-10-CM

## 2018-10-05 DIAGNOSIS — M7662 Achilles tendinitis, left leg: Secondary | ICD-10-CM

## 2018-10-05 DIAGNOSIS — L309 Dermatitis, unspecified: Secondary | ICD-10-CM

## 2018-10-05 MED ORDER — TRIAMCINOLONE ACETONIDE 10 MG/ML IJ SUSP
10.0000 mg | Freq: Once | INTRAMUSCULAR | Status: AC
  Administered 2018-10-05: 10 mg

## 2018-10-05 NOTE — Patient Instructions (Signed)

## 2018-10-06 DIAGNOSIS — F3181 Bipolar II disorder: Secondary | ICD-10-CM | POA: Diagnosis not present

## 2018-10-07 NOTE — Progress Notes (Signed)
Subjective:   Patient ID: Jasmine Jackson, female   DOB: 47 y.o.   MRN: [id number]   HPI Patient states she has a lot of pain in the back of her right heel is been going on for several months and she does get cracked dry skin on the bottom back of her heels that she uses medication on but it is not been helpful.  States it makes it hard for her to wear shoe gear and walk and she needs to be active to order to try to stay healthy.  Patient does not smoke currently and is not as active as she would like   Review of Systems  All other systems reviewed and are negative.       Objective:  Physical Exam  Constitutional: She appears well-developed and well-nourished.  Cardiovascular: Intact distal pulses.  Pulmonary/Chest: Effort normal.  Musculoskeletal: Normal range of motion.  Neurological: She is alert.  Skin: Skin is warm.  Nursing note and vitals reviewed.   Neurovascular status found to be intact muscle strength is adequate range of motion within normal limits with patient noted to have exquisite discomfort posterior heel right at the insertion of the Achilles into the calcaneus lateral side.  Central and medial side do not have that same type of the fact and there is some dry skin on the posterior plantar aspect of the right heel which is not painful but thick bilateral.  Patient has good digital perfusion well oriented x3    Assessment:  Acute Achilles tendinitis right with inflammation pain and chronic dry skin formation     Plan:  H&P conditions reviewed and x-ray reviewed right.  I did discuss injection I explained the risk of rupture associated with this and she is willing to accept risk and I did a sterile prep test of the lateral side and I carefully injected with 3 mg dexamethasone Kenalog 5 mg Xylocaine not directly into the tendon but lateral to it and I advised on reduced activity ice therapy and patient will be seen back in 3 weeks.  Also will begin Vaseline treatment along  with Saran wrap and white socks to try to help with the dry skin  X-rays indicate small spur no indications of stress fracture or advanced arthritis

## 2018-10-14 ENCOUNTER — Ambulatory Visit
Admission: RE | Admit: 2018-10-14 | Discharge: 2018-10-14 | Disposition: A | Discharge status: Home / Self Care | Payer: Medicare Other | Source: Ambulatory Visit | Attending: Orthopedic Surgery | Admitting: Orthopedic Surgery

## 2018-10-14 ENCOUNTER — Ambulatory Visit: Payer: Self-pay

## 2018-10-14 DIAGNOSIS — Z1231 Encounter for screening mammogram for malignant neoplasm of breast: Secondary | ICD-10-CM | POA: Diagnosis not present

## 2018-10-16 ENCOUNTER — Other Ambulatory Visit: Payer: Self-pay | Admitting: *Deleted

## 2018-10-16 ENCOUNTER — Other Ambulatory Visit: Payer: Self-pay

## 2018-10-16 ENCOUNTER — Ambulatory Visit (INDEPENDENT_AMBULATORY_CARE_PROVIDER_SITE_OTHER): Payer: Medicare Other | Admitting: Internal Medicine

## 2018-10-16 VITALS — BP 140/82 | HR 79 | Temp 98.3°F | Ht 66.0 in | Wt 211.3 lb

## 2018-10-16 DIAGNOSIS — K219 Gastro-esophageal reflux disease without esophagitis: Secondary | ICD-10-CM

## 2018-10-16 DIAGNOSIS — G4762 Sleep related leg cramps: Secondary | ICD-10-CM | POA: Diagnosis not present

## 2018-10-16 DIAGNOSIS — Z79899 Other long term (current) drug therapy: Secondary | ICD-10-CM

## 2018-10-16 DIAGNOSIS — F17210 Nicotine dependence, cigarettes, uncomplicated: Secondary | ICD-10-CM | POA: Diagnosis not present

## 2018-10-16 DIAGNOSIS — M25561 Pain in right knee: Secondary | ICD-10-CM | POA: Diagnosis not present

## 2018-10-16 DIAGNOSIS — M62838 Other muscle spasm: Secondary | ICD-10-CM

## 2018-10-16 DIAGNOSIS — M545 Low back pain, unspecified: Secondary | ICD-10-CM

## 2018-10-16 DIAGNOSIS — G8929 Other chronic pain: Secondary | ICD-10-CM | POA: Diagnosis not present

## 2018-10-16 DIAGNOSIS — M25562 Pain in left knee: Secondary | ICD-10-CM | POA: Diagnosis not present

## 2018-10-16 MED ORDER — BACLOFEN 5 MG PO TABS: 5.0000 mg | ORAL_TABLET | Freq: Three times a day (TID) | ORAL | 0 refills | Status: AC

## 2018-10-16 MED ORDER — DICLOFENAC SODIUM 1 % TD GEL: 2.0000 g | Freq: Four times a day (QID) | TRANSDERMAL | 1 refills | Status: DC

## 2018-10-16 NOTE — Progress Notes (Signed)
   CC: muscle spasms, cramps  HPI:  Jasmine Jackson is a 47 y.o. female who presents for follow-up on her chronic chest and low back muscle spams, as well as intermittent nocturnal leg cramps. Patient has been on Tizanidine 2 mg every 8 hrs as needed for muscle spasms, but she states this is no longer working as well.   Past Medical History:  Diagnosis Date  . Anemia   . Anxiety   . Anxiety and depression   . Asthma   . Bipolar 1 disorder ([place])   . Depression   . Excessive daytime sleepiness 11/18/2017  . GERD (gastroesophageal reflux disease)   . Heart murmur   . Hemorrhoids   . Hiatal hernia   . Hypertension   . Internal hemorrhoids   . Toe injury 11/18/2017   Review of Systems:   Constitutional: negative for fevers, chills CV: negative for palpitations, light headedness, lower extremity swelling Resp: negative for shortness of breath Neuro: negative for numbness/tingling, lower extremity weakness, saddle anesthesia, bowel or bladder incontinence   Physical Exam:  Vitals:   10/16/18 0926  BP: 140/82  Pulse: 79  Temp: 98.3 F (36.8 C)  TempSrc: Oral  SpO2: 100%  Weight: 211 lb 4.8 oz (95.8 kg)  Height: 5\' 6"  (1.676 m)   General: alert, pleasant female, appears stated age, NAD CV: RRR; no murmurs, rubs or gallops Pulm: chest wall with mild TTP of pectoral muscles bilaterally. normal respiratory effort; lungs CTA bilaterally MSK: tense paraspinal muscles in lumbar region bilaterally; negative SLR. Good ROM of lumbar spine. mild pain on active extension.   Assessment & Plan:   See Encounters Tab for problem based charting.  Patient seen with Dr. Beryle Beams

## 2018-10-16 NOTE — Telephone Encounter (Signed)
Looks like you saw one of my patients in [facility] and she's requested medication refills. I have never seen her before. You might have already refilled her meds by the time you see this but I was wondering if you feel comfortable refilling them? Not sure how extensive your encounter with her was. If you don't feel comfortable refilling them, I'll triage which medicines she really needs right now and then have her schedule an appointment with me asap. I just don't want to refill all these medicines for a patient I've never met.

## 2018-10-16 NOTE — Patient Instructions (Signed)
Jasmine Jackson, It was a pleasure meeting you today! I am checking some labs to make sure there are no electrolyte abnormalities causing your leg cramps. I would encourage you to try stretching your calves for 3 sets of 10 seconds on each side before going to sleep. This is often one of the best ways to prevent muscle cramps.   For your back and chest spasms, we will stop the Tizanidine and try a new medicine called Baclofen. I want you to take 1 tablet 3 times daily for 1 week. If you do not see any improvement, increase to 2 tablets 3 times daily. We will see you back in a couple of weeks to follow-up.   I am re-prescribing Diclofenac gel for your knee pain.   Please call with any questions or concerns. Take care! Dr. Koleen Distance

## 2018-10-17 LAB — BMP8+ANION GAP
Anion Gap: 17 mmol/L (ref 10.0–18.0)
BUN / CREAT RATIO: 15 (ref 9–23)
BUN: 13 mg/dL (ref 6–24)
CALCIUM: 10.1 mg/dL (ref 8.7–10.2)
CHLORIDE: 100 mmol/L (ref 96–106)
CO2: 26 mmol/L (ref 20–29)
Creatinine, Ser: 0.88 mg/dL (ref 0.57–1.00)
GFR, EST AFRICAN AMERICAN: 90 mL/min/{1.73_m2} (ref >59)
GFR, EST NON AFRICAN AMERICAN: 78 mL/min/{1.73_m2} (ref >59)
GLUCOSE: 81 mg/dL (ref 65–99)
POTASSIUM: 4.7 mmol/L (ref 3.5–5.2)
Sodium: 143 mmol/L (ref 134–144)

## 2018-10-17 LAB — MAGNESIUM: Magnesium: 2.1 mg/dL (ref 1.6–2.3)

## 2018-10-18 ENCOUNTER — Encounter: Payer: Self-pay | Admitting: Internal Medicine

## 2018-10-18 DIAGNOSIS — G4762 Sleep related leg cramps: Secondary | ICD-10-CM | POA: Insufficient documentation

## 2018-10-18 NOTE — Progress Notes (Signed)
Medicine attending: I personally interviewed and briefly examined this patient on the day of the patient visit and reviewed pertinent clinical ,laboratory, and radiographic data  with resident physician Dr. Earnest Conroy and we discussed a management plan. Acute on chronic muscle cramps and spasms of lower extremities worse at night. Will will start her on a trial of baclofen. Short interim follow up to assess response.

## 2018-10-18 NOTE — Assessment & Plan Note (Signed)
Patient's BMET and mag unremarkable. Encouraged her to try calf stretching each night prior to going to bed, as this has shown to be the most effective method of prevention.

## 2018-10-18 NOTE — Assessment & Plan Note (Signed)
History of chronic bilateral knee pain. Previously on oral diclofenac which gave her relief. Will do trial of Diclofenac gel to minimize GI side effects.

## 2018-10-18 NOTE — Assessment & Plan Note (Signed)
Patient reports poor symptom control on Tizanidine. No red flag back pain symptoms.  Will do trial of Baclofen 5 mg TID for 1 week. If no change or mild improvement, will increase to 10 mg TID the following week.

## 2018-10-21 MED ORDER — AMLODIPINE BESYLATE 5 MG PO TABS: ORAL_TABLET | ORAL | 3 refills | Status: DC

## 2018-10-21 MED ORDER — ALBUTEROL SULFATE HFA 108 (90 BASE) MCG/ACT IN AERS: 2.0000 | INHALATION_SPRAY | Freq: Four times a day (QID) | RESPIRATORY_TRACT | 3 refills | Status: DC | PRN

## 2018-10-21 MED ORDER — ONDANSETRON HCL 4 MG PO TABS: 4.0000 mg | ORAL_TABLET | Freq: Three times a day (TID) | ORAL | 0 refills | Status: DC | PRN

## 2018-10-21 MED ORDER — DICYCLOMINE HCL 20 MG PO TABS: 10.0000 mg | ORAL_TABLET | Freq: Three times a day (TID) | ORAL | 1 refills | Status: DC

## 2018-10-21 MED ORDER — RANITIDINE HCL 300 MG PO TABS: 300.0000 mg | ORAL_TABLET | Freq: Every day | ORAL | 3 refills | Status: DC

## 2018-10-22 ENCOUNTER — Other Ambulatory Visit: Payer: Self-pay | Admitting: Gastroenterology

## 2018-10-29 ENCOUNTER — Encounter: Payer: Self-pay | Admitting: Internal Medicine

## 2018-11-30 ENCOUNTER — Other Ambulatory Visit: Payer: Self-pay

## 2018-11-30 ENCOUNTER — Telehealth: Payer: Self-pay | Admitting: Gastroenterology

## 2018-11-30 MED ORDER — LINACLOTIDE 145 MCG PO CAPS: ORAL_CAPSULE | ORAL | 0 refills | Status: DC

## 2018-11-30 MED ORDER — HYDROCHLOROTHIAZIDE 25 MG PO TABS: 25.0000 mg | ORAL_TABLET | Freq: Every day | ORAL | 0 refills | Status: DC

## 2018-11-30 NOTE — Telephone Encounter (Signed)
hydrochlorothiazide (HYDRODIURIL) 25 MG tablet, refill request @  [facility], [place] AT [address] [phone] (Phone) [phone] (Fax)

## 2018-11-30 NOTE — Telephone Encounter (Signed)
Prescription sent to patient's pharmacy and left message for patient to notify her to make appt for further refills.

## 2018-12-22 ENCOUNTER — Other Ambulatory Visit: Payer: Self-pay | Admitting: Gastroenterology

## 2018-12-27 DIAGNOSIS — F3181 Bipolar II disorder: Secondary | ICD-10-CM | POA: Diagnosis not present

## 2019-01-03 DIAGNOSIS — F3181 Bipolar II disorder: Secondary | ICD-10-CM | POA: Diagnosis not present

## 2019-01-10 DIAGNOSIS — F3181 Bipolar II disorder: Secondary | ICD-10-CM | POA: Diagnosis not present

## 2019-01-15 ENCOUNTER — Other Ambulatory Visit: Payer: Self-pay | Admitting: Internal Medicine

## 2019-01-15 DIAGNOSIS — M545 Low back pain, unspecified: Secondary | ICD-10-CM

## 2019-01-17 DIAGNOSIS — F3181 Bipolar II disorder: Secondary | ICD-10-CM | POA: Diagnosis not present

## 2019-01-24 DIAGNOSIS — F3181 Bipolar II disorder: Secondary | ICD-10-CM | POA: Diagnosis not present

## 2019-01-25 ENCOUNTER — Other Ambulatory Visit: Payer: Self-pay | Admitting: Gastroenterology

## 2019-01-26 ENCOUNTER — Telehealth: Payer: Self-pay | Admitting: Gastroenterology

## 2019-01-26 MED ORDER — LINACLOTIDE 145 MCG PO CAPS: ORAL_CAPSULE | ORAL | 0 refills | Status: DC

## 2019-01-26 NOTE — Telephone Encounter (Signed)
Spoke with pt she advised that her medication was denied and she has been off it for three days and has not had a bm

## 2019-01-26 NOTE — Telephone Encounter (Signed)
Informed patient that she is due for follow up visit and we can send a refill after she schedules an appt. Patient scheduled appt for 02/04/19. Refill sent to patient's pharmacy.

## 2019-01-31 DIAGNOSIS — F3181 Bipolar II disorder: Secondary | ICD-10-CM | POA: Diagnosis not present

## 2019-02-04 ENCOUNTER — Ambulatory Visit: Payer: Self-pay | Admitting: Gastroenterology

## 2019-02-04 DIAGNOSIS — F3181 Bipolar II disorder: Secondary | ICD-10-CM | POA: Diagnosis not present

## 2019-02-07 DIAGNOSIS — F3181 Bipolar II disorder: Secondary | ICD-10-CM | POA: Diagnosis not present

## 2019-02-14 DIAGNOSIS — F3181 Bipolar II disorder: Secondary | ICD-10-CM | POA: Diagnosis not present

## 2019-02-21 DIAGNOSIS — F3181 Bipolar II disorder: Secondary | ICD-10-CM | POA: Diagnosis not present

## 2019-02-23 ENCOUNTER — Encounter: Payer: Self-pay | Admitting: Gastroenterology

## 2019-02-23 ENCOUNTER — Ambulatory Visit (INDEPENDENT_AMBULATORY_CARE_PROVIDER_SITE_OTHER): Payer: Medicare HMO | Admitting: Gastroenterology

## 2019-02-23 VITALS — BP 110/80 | HR 68 | Ht 66.0 in | Wt 210.6 lb

## 2019-02-23 DIAGNOSIS — K219 Gastro-esophageal reflux disease without esophagitis: Secondary | ICD-10-CM

## 2019-02-23 DIAGNOSIS — K581 Irritable bowel syndrome with constipation: Secondary | ICD-10-CM

## 2019-02-23 DIAGNOSIS — K648 Other hemorrhoids: Secondary | ICD-10-CM | POA: Diagnosis not present

## 2019-02-23 DIAGNOSIS — K642 Third degree hemorrhoids: Secondary | ICD-10-CM | POA: Diagnosis not present

## 2019-02-23 MED ORDER — OMEPRAZOLE 40 MG PO CPDR: 40.0000 mg | DELAYED_RELEASE_CAPSULE | Freq: Two times a day (BID) | ORAL | 11 refills | Status: DC

## 2019-02-23 MED ORDER — NA SULFATE-K SULFATE-MG SULF 17.5-3.13-1.6 GM/177ML PO SOLN: 1.0000 | Freq: Once | ORAL | 0 refills | Status: AC

## 2019-02-23 MED ORDER — LINACLOTIDE 290 MCG PO CAPS: 290.0000 ug | ORAL_CAPSULE | Freq: Every day | ORAL | 11 refills | Status: DC

## 2019-02-23 NOTE — Patient Instructions (Addendum)
We have sent the following medications to your pharmacy for you to pick up at your convenience:Linzess 290 mcg daily and omeprazole 40 mg twice daily.   Patient advised to avoid spicy, acidic, citrus, chocolate, mints, fruit and fruit juices.  Limit the intake of caffeine, alcohol and Soda.  Don't exercise too soon after eating.  Don't lie down within 3-4 hours of eating.  Elevate the head of your bed.  You have been scheduled for a colonoscopy. Please follow written instructions given to you at your visit today.  Please pick up your prep supplies at the pharmacy within the next 1-3 days. If you use inhalers (even only as needed), please bring them with you on the day of your procedure. Your physician has requested that you go to www.startemmi.com and enter the access code given to you at your visit today. This web site gives a general overview about your procedure. However, you should still follow specific instructions given to you by our office regarding your preparation for the procedure.  You first hemorrhoid banding is on 03/18/19 at 11:15am.    Thank you for choosing me and [facility].  Pricilla Riffle. Dagoberto Ligas., MD., [facility] Regal

## 2019-02-23 NOTE — Progress Notes (Signed)
    History of Present Illness: This is a 48 year old female with IBS-C, GERD, internal hemorrhoids.  She relates ongoing difficulties with constipation.  She has doubled her Linzess dosage with better results.  She has rectal bleeding and hemorrhoid prolapse requiring manual reduction with almost every bowel movement for the past several months.  She notes frequent problems with regurgitation however no heartburn.  Current Medications, Allergies, Past Medical History, Past Surgical History, Family History and Social History were reviewed in [employer] record.  Physical Exam: General: Well developed, well nourished, no acute distress Head: Normocephalic and atraumatic Eyes:  sclerae anicteric, EOMI Ears: Normal auditory acuity Mouth: No deformity or lesions Lungs: Clear throughout to auscultation Heart: Regular rate and rhythm; no murmurs, rubs or bruits Abdomen: Soft, non tender and non distended. No masses, hepatosplenomegaly or hernias noted. Normal Bowel sounds  Rectal: Deferred to colonoscopy Musculoskeletal: Symmetrical with no gross deformities  Pulses:  Normal pulses noted Extremities: No clubbing, cyanosis, edema or deformities noted Neurological: Alert oriented x 4, grossly nonfocal Psychological:  Alert and cooperative. Normal mood and affect   Assessment and Recommendations:  1. IBS-C. Increase Linzess to 290 mcg daily.  High-fiber diet with adequate daily water intake. Miralax daily if Linzess not fully effective.   2. GERD, with frequent regurgitation.  Increase omeprazole to 40 mg bid.  Closely follow all standard antireflux measures.  3.  Frequent rectal bleeding likely related to grade 3 internal hemorrhoids, bleeding.  Rule out colorectal neoplasms and other disorders.  Schedule colonoscopy.  Schedule hemorrhoid banding post colonoscopy.  The risks (including bleeding, perforation, infection, missed lesions, medication reactions and possible  hospitalization or surgery if complications occur), benefits, and alternatives to colonoscopy with possible biopsy and possible polypectomy were discussed with the patient and they consent to proceed.

## 2019-02-28 DIAGNOSIS — F3181 Bipolar II disorder: Secondary | ICD-10-CM | POA: Diagnosis not present

## 2019-03-04 ENCOUNTER — Other Ambulatory Visit: Payer: Self-pay | Admitting: Internal Medicine

## 2019-03-07 DIAGNOSIS — F3181 Bipolar II disorder: Secondary | ICD-10-CM | POA: Diagnosis not present

## 2019-03-15 ENCOUNTER — Encounter: Payer: Self-pay | Admitting: Gastroenterology

## 2019-03-18 ENCOUNTER — Encounter: Payer: Self-pay | Admitting: Gastroenterology

## 2019-03-21 DIAGNOSIS — F3181 Bipolar II disorder: Secondary | ICD-10-CM | POA: Diagnosis not present

## 2019-04-04 DIAGNOSIS — F3181 Bipolar II disorder: Secondary | ICD-10-CM | POA: Diagnosis not present

## 2019-04-07 ENCOUNTER — Telehealth: Payer: Self-pay | Admitting: Gastroenterology

## 2019-04-07 MED ORDER — LINACLOTIDE 145 MCG PO CAPS: 290.0000 ug | ORAL_CAPSULE | Freq: Every day | ORAL | 1 refills | Status: DC

## 2019-04-07 NOTE — Telephone Encounter (Signed)
Patient states she lost her prescription for Linzess and they will not let her get a early refill. Patient would like Linzess 145 mcg sent in and she states she will take two of them to equal the 290 mcg. Patient informed that her insurance company may not pay for the prescription but we can send it. Prescription sent to patient's pharmacy.

## 2019-04-07 NOTE — Telephone Encounter (Signed)
PT called and stated that she got her prescription filled however she lost the medication and is wanting to know if a new prescription can be called in or if she can get some samples. Please give her a call thanks.

## 2019-04-08 ENCOUNTER — Other Ambulatory Visit: Payer: Self-pay | Admitting: Internal Medicine

## 2019-04-08 ENCOUNTER — Telehealth: Payer: Self-pay | Admitting: Internal Medicine

## 2019-04-08 DIAGNOSIS — M62838 Other muscle spasm: Secondary | ICD-10-CM

## 2019-04-08 DIAGNOSIS — G4762 Sleep related leg cramps: Secondary | ICD-10-CM

## 2019-04-08 MED ORDER — TIZANIDINE HCL 4 MG PO TABS: ORAL_TABLET | ORAL | 2 refills | Status: DC

## 2019-04-08 NOTE — Telephone Encounter (Signed)
Pls contact pt regarding medicine dicyclomine (BENTYL) 20 MG tablet making her sick; pt contact [phone]

## 2019-04-08 NOTE — Telephone Encounter (Addendum)
Called pt - stated when she takes Baclofen 5 mg , it induces an anxiety attack; she has taken med 3 times, each time she has this reaction. Stated she continues to have muscle spasms; and would like something else prescribed. Thanks

## 2019-04-08 NOTE — Telephone Encounter (Signed)
Dr. Koleen Distance - -   I see that you saw this patient last.   Can you give her a call today?

## 2019-04-08 NOTE — Telephone Encounter (Signed)
Medicine Attending Noted & I agree

## 2019-04-08 NOTE — Telephone Encounter (Signed)
I spoke with Jasmine Jackson via telephone. She has struggled with muscle spasms in her chest, upper back, and legs for several years. She has been evaluated several times and has been told these muscle spasms are related to her anxiety. She takes buspirone for anxiety and used to be on tizanidine for muscle cramps. The tizanidine helped for several months and then stopped working. In Oct 2019, she was prescribed baclofen. However, baclofen makes her feel like she's having a panic attack. I encouraged her to continue stretching and offered a referral to behavioral health for CBT but she declined at this time. We decided to put her back on tizanidine, but titrate up to a higher dose than what she was on before. If her cramps aren't controlled within 1-2 weeks, she was instructed to call back.

## 2019-04-18 DIAGNOSIS — F3181 Bipolar II disorder: Secondary | ICD-10-CM | POA: Diagnosis not present

## 2019-04-19 ENCOUNTER — Telehealth: Payer: Self-pay | Admitting: *Deleted

## 2019-04-19 NOTE — Telephone Encounter (Signed)
Called pt, left message to discuss rescheduling colon cancelled due to Covid-19. No answer. Left message on home and cell to call back. I need to speak to pt to send in Rx and complete prep instructions.

## 2019-04-20 NOTE — Telephone Encounter (Signed)
Called patient to reschedule appointment that was previously cancelled due to Covid-19. Pt states that she has had a death in her family and she will call us back to reschedule her appointment at another time.

## 2019-04-25 DIAGNOSIS — F3181 Bipolar II disorder: Secondary | ICD-10-CM | POA: Diagnosis not present

## 2019-05-02 DIAGNOSIS — F3181 Bipolar II disorder: Secondary | ICD-10-CM | POA: Diagnosis not present

## 2019-05-09 DIAGNOSIS — F3181 Bipolar II disorder: Secondary | ICD-10-CM | POA: Diagnosis not present

## 2019-05-13 ENCOUNTER — Encounter: Payer: Medicare HMO | Admitting: Internal Medicine

## 2019-05-23 DIAGNOSIS — F3181 Bipolar II disorder: Secondary | ICD-10-CM | POA: Diagnosis not present

## 2019-05-28 ENCOUNTER — Other Ambulatory Visit: Payer: Self-pay | Admitting: Internal Medicine

## 2019-05-30 ENCOUNTER — Other Ambulatory Visit: Payer: Self-pay | Admitting: Internal Medicine

## 2019-05-30 DIAGNOSIS — F3181 Bipolar II disorder: Secondary | ICD-10-CM | POA: Diagnosis not present

## 2019-06-04 ENCOUNTER — Telehealth: Payer: Self-pay | Admitting: Internal Medicine

## 2019-06-04 NOTE — Telephone Encounter (Signed)
Patient is requesting call back from nurse states hot flashes are unbearable.

## 2019-06-08 NOTE — Telephone Encounter (Signed)
Returned call to patient. No answer. Left message on VM requesting return call to schedule appt. Hubbard Hartshorn, RN, BSN

## 2019-06-12 ENCOUNTER — Encounter: Payer: Self-pay | Admitting: *Deleted

## 2019-06-13 DIAGNOSIS — F3181 Bipolar II disorder: Secondary | ICD-10-CM | POA: Diagnosis not present

## 2019-06-20 DIAGNOSIS — F3181 Bipolar II disorder: Secondary | ICD-10-CM | POA: Diagnosis not present

## 2019-07-04 DIAGNOSIS — F3181 Bipolar II disorder: Secondary | ICD-10-CM | POA: Diagnosis not present

## 2019-07-07 ENCOUNTER — Telehealth: Payer: Self-pay | Admitting: Internal Medicine

## 2019-07-07 NOTE — Telephone Encounter (Signed)
   Reason for call:   I received a call from Ms. [name] at 2104 hours indicating that she had been bitten by a spider on her foot.   Pertinent Data:  Patient reported that while working in her garden she believes she incurred a spider bite. This was first observed on 07/06/2019 in the afternoon after finishing in the garden. She did not see the culprit.  The area was initially described as a small red dot, that progressed to an erythematous, swollen, tender area that is about 3cm x 3cm" with a red center, no dark center or description consistent with necrosis. This is associated with nausea and vomiting that began just shortly after the bite. She denied overheating in the garden, denied fever, chills, dyspnea, headaches, visual changes, denied bowel or bladder changes, myalgias, tremor, weakness or diaphoresis.  She has not taken anything for the symptoms and nothing makes them better/worse.    Assessment / Plan / Recommendations:   I advised the patient that given the systemic signs of nausea/vomiting, I would be unable to safely recommend that she forgo additional medical evaluation and as such she should visit the ED.   She opted to "wait it out" and thus she was advised to watch for signs of progression such as the previously denied symptoms above.   Given the uncertainty that this was indeed a spider bite and the possibility that her systemic symptoms could be unrelated I did not advise that she call 911.  As always, pt is advised that if symptoms worsen or new symptoms arise, they should go to an urgent care facility or to to ER for further evaluation.   Kathi Ludwig, MD   07/07/2019, 9:11 PM

## 2019-07-08 ENCOUNTER — Other Ambulatory Visit: Payer: Self-pay | Admitting: Internal Medicine

## 2019-07-08 ENCOUNTER — Other Ambulatory Visit: Payer: Self-pay

## 2019-07-08 MED ORDER — ONDANSETRON HCL 4 MG PO TABS: ORAL_TABLET | ORAL | 0 refills | Status: DC

## 2019-07-08 NOTE — Telephone Encounter (Signed)
amLODipine (NORVASC) 5 MG tablet  hydrochlorothiazide (HYDRODIURIL) 25 MG tablet  ondansetron (ZOFRAN) 4 MG tablet, REFILL REQUEST @  [id number] [place], [place] - [address] AT [place] [phone] (Phone) [phone] (Fax)

## 2019-07-15 MED ORDER — AMLODIPINE BESYLATE 5 MG PO TABS: ORAL_TABLET | ORAL | 1 refills | Status: DC

## 2019-07-17 ENCOUNTER — Other Ambulatory Visit: Payer: Self-pay | Admitting: Internal Medicine

## 2019-07-17 MED ORDER — AMLODIPINE BESYLATE 5 MG PO TABS: ORAL_TABLET | ORAL | 1 refills | Status: DC

## 2019-07-18 DIAGNOSIS — F3181 Bipolar II disorder: Secondary | ICD-10-CM | POA: Diagnosis not present

## 2019-07-22 ENCOUNTER — Other Ambulatory Visit: Payer: Self-pay | Admitting: *Deleted

## 2019-07-22 MED ORDER — ALBUTEROL SULFATE HFA 108 (90 BASE) MCG/ACT IN AERS: 2.0000 | INHALATION_SPRAY | Freq: Four times a day (QID) | RESPIRATORY_TRACT | 2 refills | Status: DC | PRN

## 2019-07-22 NOTE — Telephone Encounter (Signed)
Next appt scheduled  07/29/19 with PCP.

## 2019-07-29 ENCOUNTER — Encounter: Payer: Medicare HMO | Admitting: Internal Medicine

## 2019-08-01 DIAGNOSIS — F3181 Bipolar II disorder: Secondary | ICD-10-CM | POA: Diagnosis not present

## 2019-08-15 DIAGNOSIS — F3181 Bipolar II disorder: Secondary | ICD-10-CM | POA: Diagnosis not present

## 2019-08-19 ENCOUNTER — Encounter: Payer: Medicare HMO | Admitting: Internal Medicine

## 2019-08-22 DIAGNOSIS — F3181 Bipolar II disorder: Secondary | ICD-10-CM | POA: Diagnosis not present

## 2019-09-20 ENCOUNTER — Other Ambulatory Visit: Payer: Self-pay | Admitting: Orthopedic Surgery

## 2019-09-20 DIAGNOSIS — Z1231 Encounter for screening mammogram for malignant neoplasm of breast: Secondary | ICD-10-CM

## 2019-10-01 ENCOUNTER — Telehealth: Payer: Self-pay | Admitting: Internal Medicine

## 2019-10-01 NOTE — Telephone Encounter (Signed)
Pt is requesting nurse to callback pt is having pain in her knee, would like to know if MD can increase her pain medicine contact pt [phone]

## 2019-10-01 NOTE — Telephone Encounter (Addendum)
Returned call to patient. Last seen at [facility] 10/16/2018. Appt made for 10/14/2019 to meet new PCP and discuss pain contract. Hubbard Hartshorn, BSN, RN-BC

## 2019-10-04 ENCOUNTER — Other Ambulatory Visit: Payer: Self-pay | Admitting: Internal Medicine

## 2019-10-05 NOTE — Telephone Encounter (Signed)
Next appt scheduled 10/22 with PCP. 

## 2019-10-14 ENCOUNTER — Encounter: Payer: Medicare HMO | Admitting: Internal Medicine

## 2019-10-16 ENCOUNTER — Emergency Department (HOSPITAL_COMMUNITY)
Admission: EM | Admit: 2019-10-16 | Discharge: 2019-10-16 | Disposition: A | Discharge status: Home / Self Care | Payer: Medicare HMO | Attending: Emergency Medicine | Admitting: Emergency Medicine

## 2019-10-16 ENCOUNTER — Other Ambulatory Visit: Payer: Self-pay

## 2019-10-16 ENCOUNTER — Emergency Department (HOSPITAL_COMMUNITY): Payer: Medicare HMO

## 2019-10-16 ENCOUNTER — Encounter (HOSPITAL_COMMUNITY): Payer: Self-pay | Admitting: Emergency Medicine

## 2019-10-16 DIAGNOSIS — R112 Nausea with vomiting, unspecified: Secondary | ICD-10-CM | POA: Insufficient documentation

## 2019-10-16 DIAGNOSIS — J45909 Unspecified asthma, uncomplicated: Secondary | ICD-10-CM | POA: Insufficient documentation

## 2019-10-16 DIAGNOSIS — E860 Dehydration: Secondary | ICD-10-CM

## 2019-10-16 DIAGNOSIS — Z79899 Other long term (current) drug therapy: Secondary | ICD-10-CM | POA: Diagnosis not present

## 2019-10-16 DIAGNOSIS — I10 Essential (primary) hypertension: Secondary | ICD-10-CM | POA: Diagnosis not present

## 2019-10-16 DIAGNOSIS — R1084 Generalized abdominal pain: Secondary | ICD-10-CM | POA: Diagnosis not present

## 2019-10-16 DIAGNOSIS — F17210 Nicotine dependence, cigarettes, uncomplicated: Secondary | ICD-10-CM | POA: Diagnosis not present

## 2019-10-16 DIAGNOSIS — R4020 Unspecified coma: Secondary | ICD-10-CM | POA: Diagnosis not present

## 2019-10-16 DIAGNOSIS — R1033 Periumbilical pain: Secondary | ICD-10-CM | POA: Diagnosis present

## 2019-10-16 DIAGNOSIS — R55 Syncope and collapse: Secondary | ICD-10-CM | POA: Diagnosis not present

## 2019-10-16 DIAGNOSIS — R52 Pain, unspecified: Secondary | ICD-10-CM | POA: Diagnosis not present

## 2019-10-16 LAB — COMPREHENSIVE METABOLIC PANEL
ALT: 20 U/L (ref 0–44)
AST: 26 U/L (ref 15–41)
Albumin: 4.5 g/dL (ref 3.5–5.0)
Alkaline Phosphatase: 100 U/L (ref 38–126)
Anion gap: 18 — ABNORMAL HIGH (ref 5–15)
BUN: 17 mg/dL (ref 6–20)
CO2: 20 mmol/L — ABNORMAL LOW (ref 22–32)
Calcium: 10.6 mg/dL — ABNORMAL HIGH (ref 8.9–10.3)
Chloride: 102 mmol/L (ref 98–111)
Creatinine, Ser: 1.05 mg/dL — ABNORMAL HIGH (ref 0.44–1.00)
GFR calc Af Amer: >60 mL/min (ref >60)
GFR calc non Af Amer: >60 mL/min (ref >60)
Glucose, Bld: 131 mg/dL — ABNORMAL HIGH (ref 70–99)
Potassium: 3.1 mmol/L — ABNORMAL LOW (ref 3.5–5.1)
Sodium: 140 mmol/L (ref 135–145)
Total Bilirubin: 0.4 mg/dL (ref 0.3–1.2)
Total Protein: 8.5 g/dL — ABNORMAL HIGH (ref 6.5–8.1)

## 2019-10-16 LAB — URINALYSIS, ROUTINE W REFLEX MICROSCOPIC
Bilirubin Urine: NEGATIVE
Glucose, UA: NEGATIVE mg/dL
Hgb urine dipstick: NEGATIVE
Ketones, ur: NEGATIVE mg/dL
Leukocytes,Ua: NEGATIVE
Nitrite: NEGATIVE
Protein, ur: NEGATIVE mg/dL
Specific Gravity, Urine: 1.019 (ref 1.005–1.030)
pH: 6 (ref 5.0–8.0)

## 2019-10-16 LAB — CBC
HCT: 47.2 % — ABNORMAL HIGH (ref 36.0–46.0)
Hemoglobin: 15.6 g/dL — ABNORMAL HIGH (ref 12.0–15.0)
MCH: 28.1 pg (ref 26.0–34.0)
MCHC: 33.1 g/dL (ref 30.0–36.0)
MCV: 85 fL (ref 80.0–100.0)
Platelets: 401 10*3/uL — ABNORMAL HIGH (ref 150–400)
RBC: 5.55 MIL/uL — ABNORMAL HIGH (ref 3.87–5.11)
RDW: 14.5 % (ref 11.5–15.5)
WBC: 18.9 10*3/uL — ABNORMAL HIGH (ref 4.0–10.5)
nRBC: 0 % (ref 0.0–0.2)

## 2019-10-16 LAB — MAGNESIUM: Magnesium: 1.9 mg/dL (ref 1.7–2.4)

## 2019-10-16 LAB — LIPASE, BLOOD: Lipase: 29 U/L (ref 11–51)

## 2019-10-16 LAB — HCG, QUANTITATIVE, PREGNANCY: hCG, Beta Chain, Quant, S: 1 m[IU]/mL (ref <5)

## 2019-10-16 MED ORDER — ONDANSETRON HCL 4 MG/2ML IJ SOLN
4.0000 mg | Freq: Once | INTRAMUSCULAR | Status: AC
  Administered 2019-10-16: 4 mg via INTRAVENOUS
  Filled 2019-10-16: qty 2

## 2019-10-16 MED ORDER — ONDANSETRON HCL 4 MG PO TABS
4.0000 mg | ORAL_TABLET | Freq: Once | ORAL | Status: AC
  Administered 2019-10-16: 4 mg via ORAL
  Filled 2019-10-16: qty 1

## 2019-10-16 MED ORDER — ONDANSETRON HCL 4 MG/2ML IJ SOLN: 4.0000 mg | Freq: Once | INTRAMUSCULAR | Status: DC

## 2019-10-16 MED ORDER — SODIUM CHLORIDE 0.9 % IV BOLUS
1000.0000 mL | Freq: Once | INTRAVENOUS | Status: AC
  Administered 2019-10-16: 1000 mL via INTRAVENOUS

## 2019-10-16 MED ORDER — DICYCLOMINE HCL 20 MG PO TABS
20.0000 mg | ORAL_TABLET | Freq: Once | ORAL | Status: AC
  Administered 2019-10-16: 20 mg via ORAL
  Filled 2019-10-16: qty 1

## 2019-10-16 MED ORDER — HALOPERIDOL LACTATE 5 MG/ML IJ SOLN
5.0000 mg | Freq: Once | INTRAMUSCULAR | Status: AC
  Administered 2019-10-16: 5 mg via INTRAVENOUS
  Filled 2019-10-16: qty 1

## 2019-10-16 MED ORDER — PROMETHAZINE HCL 25 MG RE SUPP: 25.0000 mg | Freq: Four times a day (QID) | RECTAL | 0 refills | Status: DC | PRN

## 2019-10-16 MED ORDER — POTASSIUM CHLORIDE CRYS ER 20 MEQ PO TBCR
40.0000 meq | EXTENDED_RELEASE_TABLET | Freq: Once | ORAL | Status: AC
  Administered 2019-10-16: 40 meq via ORAL
  Filled 2019-10-16: qty 2

## 2019-10-16 MED ORDER — SODIUM CHLORIDE 0.9% FLUSH: 3.0000 mL | Freq: Once | INTRAVENOUS | Status: DC

## 2019-10-16 NOTE — ED Triage Notes (Signed)
Pt to triage via [facility]> family member called stating she was altered, vomiting, and diaphoretic since this morning.  Daughter reported syncopal episode at home.  CBG 244.  No history of DM.  Pt reports nausea, generalized abd pain, and feeling cold since 10am.  No known COVID exposures.

## 2019-10-16 NOTE — ED Notes (Signed)
Pt mom Tonya [phone]

## 2019-10-16 NOTE — Discharge Instructions (Addendum)
Please follow up with your primary care at [facility] for followup on today's visit. Your symptoms and exam are consistent with an exacerbation of your IBS or possible viral gastrointestinal illness. However, if you have a change in your symptoms that concerns you please return to ED. Please return immediately if you experience intractable vomiting, worsening abdominal pain or pain that localizes to a specific area. Continue to eat bland food as able and drink water. Use phenergan suppositories for nausea. Please take your home bentyl as prescribed as we have discussed.

## 2019-10-16 NOTE — ED Provider Notes (Addendum)
[facility] DEPARTMENT Provider Note   CSN: [id number] Arrival date & time: 10/16/19  1225     History   Chief Complaint Chief Complaint  Patient presents with   Loss of Consciousness   Abdominal Pain    HPI Jasmine Jackson is a 48 y.o. female history of IBD, chronic constipation, HTN, hemorrhoids.    HPI  Patient presents for general, diffuse, 7/10 dull, achy abdominal pain that is umbilical. Patient states pain began this morning when she woke up and has been constant since.  Patient states pain is worse with sitting up and better laying supine.  Associated nausea and vomiting x3 nonbloody nonbilious.  Patient states shortly after waking up this morning she had one episode of syncope where she was walking to her room felt hot and lightheaded and woke up on the floor.  Denies any headache or neck pain.  States last bowel movement was 2 days ago.  States she is eating normally and drinking normally the past 2 days.  Taking her medication for constipation. Takes her bentyl irregularly and has not taken recently. Has been passing gas frequently.  Patient had partial hysterectomy years ago.  No other abdominal surgeries still has appendix.  No history of diabetes.  Has no urinary symptoms, vaginal discharge, pelvic pain, or fever.  15 cigs /days  Etoh 3 years ago, used to drink rarely No illicit drug use  No recent travel, no sick contacts  Past Medical History:  Diagnosis Date   Anemia    Anxiety    Anxiety and depression    Asthma    Bipolar 1 disorder (HCC)    Depression    Excessive daytime sleepiness 11/18/2017   GERD (gastroesophageal reflux disease)    Heart murmur    Hemorrhoids    Hiatal hernia    Hypertension    Internal hemorrhoids    Toe injury 11/18/2017    Patient Active Problem List   Diagnosis Date Noted   Nocturnal leg cramps 10/18/2018   Heel pain, bilateral 09/21/2018   Bloody diarrhea 05/22/2018    Knee pain 03/05/2018   Migraine 11/18/2017   Snoring 11/18/2017   Colitis 03/06/2017   Vaginal discharge 12/25/2016   Hot flashes 11/27/2016   Tobacco use  10/18/2016   Panic attacks 09/09/2016   Health care maintenance 01/11/2016   Low back pain 12/02/2015   Hypertension 04/24/2015   Muscle spasm 04/24/2015   Bipolar disorder ([place]) 04/16/2014   History of gout 04/01/2014   Internal hemorrhoids without complication [id number]   Irritable bowel syndrome 12/08/2013   Esophageal reflux 12/08/2013    Past Surgical History:  Procedure Laterality Date   ABDOMINAL HYSTERECTOMY     fibroids   HEMORRHOID SURGERY     INGUINAL HERNIA REPAIR     TONSILLECTOMY     TUBAL LIGATION       OB History   No obstetric history on file.      Home Medications    Prior to Admission medications   Medication Sig Start Date End Date Taking? Authorizing Provider  albuterol (VENTOLIN HFA) 108 (90 Base) MCG/ACT inhaler Inhale 2 puffs into the lungs every 6 (six) hours as needed for wheezing or shortness of breath. 07/22/19   Axel Filler, MD  amLODipine (NORVASC) 5 MG tablet TAKE 1 TABLET(5 MG) BY MOUTH DAILY 07/17/19   Marianna Payment, MD  busPIRone (BUSPAR) 15 MG tablet Take 30 mg by mouth 2 (two) times daily.  [provider]  dicyclomine (BENTYL) 20 MG tablet Take 0.5 tablets (10 mg total) by mouth 4 (four) times daily -  before meals and at bedtime. 10/21/18   Isabelle Course, MD  FLUoxetine (PROZAC) 40 MG capsule Take 2 capsules (80 mg total) by mouth every morning. 01/01/18   Jule Ser, DO  gabapentin (NEURONTIN) 800 MG tablet Take 800 mg by mouth 3 (three) times daily. 04/01/16   [provider]  hydrochlorothiazide (HYDRODIURIL) 25 MG tablet TAKE 1 TABLET(25 MG) BY MOUTH DAILY 10/06/19   Marianna Payment, MD  hydrOXYzine (ATARAX/VISTARIL) 50 MG tablet Take 50 mg by mouth 3 (three) times daily. 02/13/17   [provider]  linaclotide  Rolan Lipa) 145 MCG CAPS capsule Take 2 capsules (290 mcg total) by mouth daily before breakfast. 04/07/19   Ladene Artist, MD  omeprazole (PRILOSEC) 40 MG capsule Take 1 capsule (40 mg total) by mouth 2 (two) times daily. 02/23/19   Ladene Artist, MD  ondansetron (ZOFRAN) 4 MG tablet TAKE 1 TABLET(4 MG) BY MOUTH EVERY 8 HOURS AS NEEDED FOR NAUSEA OR VOMITING 07/08/19   Santos-Sanchez, Merlene Morse, MD  ondansetron (ZOFRAN-ODT) 4 MG disintegrating tablet DISSOLVE 1 TABLET(4 MG) ON THE TONGUE EVERY 8 HOURS AS NEEDED FOR NAUSEA OR VOMITING 05/25/18   Jule Ser, DO  polyethylene glycol (MIRALAX / GLYCOLAX) packet Take 17 g by mouth daily. 04/26/17   Rice, Resa Miner, MD  promethazine (PHENERGAN) 25 MG suppository Place 1 suppository (25 mg total) rectally every 6 (six) hours as needed for nausea or vomiting. 10/16/19   Tedd Sias, PA  QUEtiapine (SEROQUEL XR) 400 MG 24 hr tablet Take 3 tablets (1,200 mg total) by mouth at bedtime. 01/01/18   Jule Ser, DO  QUEtiapine (SEROQUEL) 400 MG tablet Take 200 mg by mouth daily. At 8 pm 10/10/17   [provider]  ranitidine (ZANTAC) 300 MG tablet Take 1 tablet (300 mg total) by mouth daily. 10/21/18   Isabelle Course, MD  tiZANidine (ZANAFLEX) 4 MG tablet Take 0.5 tablets (2 mg total) by mouth 3 (three) times daily for 4 days, THEN 1 tablet (4 mg total) 3 (three) times daily. 04/08/19 04/11/20  Isabelle Course, MD    Family History Family History  Problem Relation Age of Onset   Breast cancer Maternal Aunt        x4   Stomach cancer Maternal Aunt    Prostate cancer Maternal Uncle        x2   Breast cancer Maternal Grandmother    Celiac disease Mother    Breast cancer Mother 40   Breast cancer Paternal Grandmother    Colon cancer Neg Hx    Esophageal cancer Neg Hx    Rectal cancer Neg Hx     Social History Social History   Tobacco Use   Smoking status: Current Every Day Smoker    Packs/day: 0.25    Types: Cigarettes     Smokeless tobacco: Never Used   Tobacco comment: 10 CIG A DAY  Substance Use Topics   Alcohol use: No    Alcohol/week: 0.0 standard drinks   Drug use: No     Allergies   Asa [aspirin], Banana, and Morphine and related   Review of Systems Review of Systems  Constitutional: Positive for chills. Negative for fever.  Gastrointestinal: Positive for abdominal pain and constipation.  Genitourinary: Negative for dysuria.  All other systems reviewed and are negative.    Physical Exam Updated Vital  Signs BP (!) 151/91    Pulse 92    Temp 99.6 F (37.6 C) (Oral)    Resp 18    SpO2 100%   Physical Exam Vitals signs and nursing note reviewed.  Constitutional:      Appearance: She is not ill-appearing.     Comments: Patient is alert, able answer questions coherently, speaking full sentences, follows commands  HENT:     Head: Normocephalic and atraumatic.     Mouth/Throat:     Mouth: Mucous membranes are moist.  Eyes:     General: No scleral icterus. Neck:     Musculoskeletal: No neck rigidity.  Cardiovascular:     Rate and Rhythm: Normal rate and regular rhythm.     Pulses: Normal pulses.     Heart sounds: Normal heart sounds.  Pulmonary:     Effort: Pulmonary effort is normal.     Breath sounds: Normal breath sounds.  Abdominal:     General: Abdomen is flat. Bowel sounds are normal.     Palpations: Abdomen is soft.     Tenderness: There is no abdominal tenderness. There is no right CVA tenderness, left CVA tenderness, guarding or rebound.     Comments: No bruising, distention.  No focal tenderness to palpation.  No guarding or rebound.  Musculoskeletal:     Right lower leg: No edema.     Left lower leg: No edema.  Skin:    General: Skin is warm and dry.     Capillary Refill: Capillary refill takes less than 2 seconds.  Neurological:     Mental Status: She is alert. Mental status is at baseline.  Psychiatric:        Behavior: Behavior normal.      ED  Treatments / Results  Labs (all labs ordered are listed, but only abnormal results are displayed) Labs Reviewed  COMPREHENSIVE METABOLIC PANEL - Abnormal; Notable for the following components:      Result Value   Potassium 3.1 (*)    CO2 20 (*)    Glucose, Bld 131 (*)    Creatinine, Ser 1.05 (*)    Calcium 10.6 (*)    Total Protein 8.5 (*)    Anion gap 18 (*)    All other components within normal limits  CBC - Abnormal; Notable for the following components:   WBC 18.9 (*)    RBC 5.55 (*)    Hemoglobin 15.6 (*)    HCT 47.2 (*)    Platelets 401 (*)    All other components within normal limits  LIPASE, BLOOD  HCG, QUANTITATIVE, PREGNANCY  MAGNESIUM  URINALYSIS, ROUTINE W REFLEX MICROSCOPIC    EKG EKG Interpretation  Date/Time:  Saturday October 16 2019 17:36:34 EDT Ventricular Rate:  98 PR Interval:  142 QRS Duration: 105 QT Interval:  371 QTC Calculation: 474 R Axis:   33 Text Interpretation:  Sinus rhythm LAE, consider biatrial enlargement RSR' in V1 or V2, right VCD or RVH No acute changes No significant change since last tracing Confirmed by Varney Biles [phone]) on 10/16/2019 6:25:42 PM   Radiology Dg Abd Acute W/chest  Result Date: 10/16/2019 CLINICAL DATA:  Vomiting and diaphoresis EXAM: DG ABDOMEN ACUTE W/ 1V CHEST COMPARISON:  Abdomen radiographs September 17, 2009 FINDINGS: PA chest: No edema or consolidation. Heart size and pulmonary vascularity are normal. No adenopathy. Supine and upright abdomen: There is moderate stool throughout the colon. There is no appreciable bowel dilatation. However, there are multiple air-fluid levels throughout the  abdomen. No free air. No abnormal calcifications. IMPRESSION: Multiple air-fluid levels present. This appearance may be seen with either ileus or bowel obstruction. Severe enteritis is a differential consideration. No free air. No edema or consolidation. Electronically Signed   By: Lowella Grip III M.D.   On:  10/16/2019 14:38    Procedures Procedures (including critical care time)  Medications Ordered in ED Medications  sodium chloride flush (NS) 0.9 % injection 3 mL (3 mLs Intravenous Not Given 10/16/19 1428)  potassium chloride SA (KLOR-CON) CR tablet 40 mEq (has no administration in time range)  sodium chloride 0.9 % bolus 1,000 mL (0 mLs Intravenous Stopped 10/16/19 1937)  ondansetron (ZOFRAN) injection 4 mg (4 mg Intravenous Given 10/16/19 1407)  dicyclomine (BENTYL) tablet 20 mg (20 mg Oral Given 10/16/19 1437)  ondansetron (ZOFRAN) tablet 4 mg (4 mg Oral Given 10/16/19 1918)  sodium chloride 0.9 % bolus 1,000 mL (1,000 mLs Intravenous New Bag/Given 10/16/19 1927)  haloperidol lactate (HALDOL) injection 5 mg (5 mg Intravenous Given 10/16/19 1957)     Initial Impression / Assessment and Plan / ED Course  I have reviewed the triage vital signs and the nursing notes.  Pertinent labs & imaging results that were available during my care of the patient were reviewed by me and considered in my medical decision making (see chart for details).        Patient with umbilical abdominal pain that began this morning with associated nausea and vomiting.  Patient has also had diarrhea last several bowel movements.  Doubt SBO as patient has been passing gas consistently for the past day and during ED visit.  Patient states that her last bowel movements were diarrheal.  Doubt mesenteric ischemia as patient symptoms consistent with her history of IBS and pain has improved with Bentyl and zofran initially.  Doubt DKA as patient has very mildly elevated blood sugar at 131 after minimal fluids by EMS.  Doubt pancreatitis as lipase is within normal limits and pain is not epigastric or radiating to back.  Patient's elevated blood sugar of 244 improved with fluids to 131.   Potassium low 3.1, creatinine mildly elevated at 1.05, white count 18.9 all likely due to forceful emesis today.  Negative pregnancy  test, normal lipase, normal magnesium.   Discussed case with attending physician including patient's presenting symptoms, physical exam, and planned diagnostics and interventions. Attending physician stated agreement with plan or made changes to plan which were implemented.  Attending physician assessed patient at bedside.   Patient has reassuring abdominal exam with no tenderness to palpation but generalized umbilical discomfort.  Patient initially improved with Zofran, Bentyl and fluids but had one episode of emesis.  Given additional Zofran, second liter of fluid and 5 mg of Haldol.  Given p.o. potassium which patient tolerated without vomiting.  Orthostatic vital signs are negative after fluid administration.  Patient able to walk to bathroom with normal gait without discomfort, nausea or vomiting.  8:58 PM Patient reassessed. Feels improved. Benign abdominal exam. States she feels okay for discharge. I personally PO challenged patient and she was able to tolerate sprite. Feels less nauseated after haldol. Will discharge with phenergan supp. Discussed patient non-compliance with her home bentyl and importance of use as prescribed. Patient understands. Agreeable to plan. Given strict return precautions.   10:38 PM urinalysis collected late during ED visit due to patient dropping During initial collection.  Results show no ketones and no signs of infection.  The patient appears reasonably screened and/or  stabilized for discharge and I doubt any other medical condition or other [facility] requiring further screening, evaluation, or treatment in the ED at this time prior to discharge.  Patient is hemodynamically stable, in NAD, and able to ambulate in the ED. Pain has been managed or a plan has been made for home management and has no complaints prior to discharge. Patient is comfortable with above plan and is stable for discharge at this time. All questions were answered prior to disposition. Results from  the ER workup discussed with the patient face to face and all questions answered to the best of my ability. The patient is safe for discharge with strict return precautions. Patient appears safe for discharge with appropriate follow-up.  Conveyed my impression with the patient and he voiced understanding and is agreeable to plan.   An After Visit Summary was printed and given to the patient.  Portions of this note were generated with [employer]. Dictation errors may occur despite best attempts at proofreading.    Final Clinical Impressions(s) / ED Diagnoses   Final diagnoses:  Non-intractable vomiting with nausea, unspecified vomiting type  Dehydration    ED Discharge Orders         Ordered    promethazine (PHENERGAN) 25 MG suppository  Every 6 hours PRN     10/16/19 2055           Tedd Sias, [place] 10/16/19 2221    Tedd Sias, [place] 10/16/19 2238    Varney Biles, MD 10/21/19 2322

## 2019-10-16 NOTE — ED Notes (Signed)
Pt unalbe to urinate at this time

## 2019-10-17 ENCOUNTER — Other Ambulatory Visit: Payer: Self-pay | Admitting: Internal Medicine

## 2019-10-17 DIAGNOSIS — K582 Mixed irritable bowel syndrome: Secondary | ICD-10-CM

## 2019-10-17 MED ORDER — METOCLOPRAMIDE HCL 10 MG PO TABS: 10.0000 mg | ORAL_TABLET | Freq: Two times a day (BID) | ORAL | 0 refills | Status: DC | PRN

## 2019-10-17 NOTE — Progress Notes (Signed)
Paged by patient asking for another nausea medicine (she was prescribed phenergan suppository by ED provider yesterday but it coses $60). She has Zofran at home but it did not help. Her nausea is better but still bothers her. I send prescription for few metoclopramide and asked her to follow up with her PCP tomorrow if not better. She agrees with the plan.

## 2019-10-18 ENCOUNTER — Other Ambulatory Visit: Payer: Self-pay | Admitting: *Deleted

## 2019-10-22 MED ORDER — ALBUTEROL SULFATE HFA 108 (90 BASE) MCG/ACT IN AERS: 2.0000 | INHALATION_SPRAY | Freq: Four times a day (QID) | RESPIRATORY_TRACT | 2 refills | Status: DC | PRN

## 2019-11-04 ENCOUNTER — Ambulatory Visit (INDEPENDENT_AMBULATORY_CARE_PROVIDER_SITE_OTHER): Payer: Medicare HMO | Admitting: Internal Medicine

## 2019-11-04 ENCOUNTER — Ambulatory Visit (HOSPITAL_COMMUNITY)
Admission: RE | Admit: 2019-11-04 | Discharge: 2019-11-04 | Disposition: A | Discharge status: Home / Self Care | Payer: Medicare HMO | Source: Ambulatory Visit | Attending: Internal Medicine | Admitting: Internal Medicine

## 2019-11-04 ENCOUNTER — Other Ambulatory Visit: Payer: Self-pay

## 2019-11-04 ENCOUNTER — Encounter: Payer: Self-pay | Admitting: Internal Medicine

## 2019-11-04 VITALS — BP 128/79 | HR 80 | Temp 98.1°F | Wt 213.2 lb

## 2019-11-04 DIAGNOSIS — Z79899 Other long term (current) drug therapy: Secondary | ICD-10-CM

## 2019-11-04 DIAGNOSIS — G8929 Other chronic pain: Secondary | ICD-10-CM

## 2019-11-04 DIAGNOSIS — M25562 Pain in left knee: Secondary | ICD-10-CM | POA: Insufficient documentation

## 2019-11-04 DIAGNOSIS — K581 Irritable bowel syndrome with constipation: Secondary | ICD-10-CM | POA: Diagnosis not present

## 2019-11-04 DIAGNOSIS — M25561 Pain in right knee: Secondary | ICD-10-CM

## 2019-11-04 DIAGNOSIS — Z23 Encounter for immunization: Secondary | ICD-10-CM

## 2019-11-04 DIAGNOSIS — F419 Anxiety disorder, unspecified: Secondary | ICD-10-CM

## 2019-11-04 DIAGNOSIS — K582 Mixed irritable bowel syndrome: Secondary | ICD-10-CM | POA: Diagnosis not present

## 2019-11-04 DIAGNOSIS — K5900 Constipation, unspecified: Secondary | ICD-10-CM

## 2019-11-04 DIAGNOSIS — F319 Bipolar disorder, unspecified: Secondary | ICD-10-CM

## 2019-11-04 NOTE — Patient Instructions (Signed)
Thank you, [name] for allowing [place] to provide your care today. Today we discussed knee pain, flu shot, Anxiety, Constipation, recent hospital visit.  I have ordered CBC, BMP labs for you. I will call if any are abnormal.    I have place a referrals to Gastroenterology for colonoscopy and management of Irritable bowel syndrome.   I have ordered the following tests: X-ray of knees   I have ordered the following medication/changed the following medications: none   Please follow-up in 1-2 weeks once you have done your X-ray for knee injections.    Should you have any questions or concerns please call the internal medicine clinic at [phone].    Marianna Payment, D.O. [facility]

## 2019-11-05 ENCOUNTER — Ambulatory Visit
Admission: RE | Admit: 2019-11-05 | Discharge: 2019-11-05 | Disposition: A | Discharge status: Home / Self Care | Payer: Medicare HMO | Source: Ambulatory Visit | Attending: Orthopedic Surgery | Admitting: Orthopedic Surgery

## 2019-11-05 DIAGNOSIS — Z1231 Encounter for screening mammogram for malignant neoplasm of breast: Secondary | ICD-10-CM

## 2019-11-05 LAB — CBC
Hematocrit: 39.4 % (ref 34.0–46.6)
Hemoglobin: 13.4 g/dL (ref 11.1–15.9)
MCH: 28 pg (ref 26.6–33.0)
MCHC: 34 g/dL (ref 31.5–35.7)
MCV: 82 fL (ref 79–97)
Platelets: 404 10*3/uL (ref 150–450)
RBC: 4.79 x10E6/uL (ref 3.77–5.28)
RDW: 13.9 % (ref 11.7–15.4)
WBC: 7.2 10*3/uL (ref 3.4–10.8)

## 2019-11-05 LAB — BMP8+ANION GAP
Anion Gap: 17 mmol/L (ref 10.0–18.0)
BUN/Creatinine Ratio: 13 (ref 9–23)
BUN: 12 mg/dL (ref 6–24)
CO2: 27 mmol/L (ref 20–29)
Calcium: 10.4 mg/dL — ABNORMAL HIGH (ref 8.7–10.2)
Chloride: 95 mmol/L — ABNORMAL LOW (ref 96–106)
Creatinine, Ser: 0.9 mg/dL (ref 0.57–1.00)
GFR calc Af Amer: 87 mL/min/{1.73_m2} (ref >59)
GFR calc non Af Amer: 75 mL/min/{1.73_m2} (ref >59)
Glucose: 81 mg/dL (ref 65–99)
Potassium: 4.4 mmol/L (ref 3.5–5.2)
Sodium: 139 mmol/L (ref 134–144)

## 2019-11-07 MED ORDER — METOCLOPRAMIDE HCL 10 MG PO TABS: 10.0000 mg | ORAL_TABLET | Freq: Two times a day (BID) | ORAL | 0 refills | Status: DC | PRN

## 2019-11-07 NOTE — Progress Notes (Signed)
   CC: Knee pain  HPI:  Jasmine Jackson is a 48 y.o. female with a past medical history stated below and presents today for evaluation and management of knee pain. Please see problem based assessment and plan for additional details.    Past Medical History:  Diagnosis Date  . Anemia   . Anxiety   . Anxiety and depression   . Asthma   . Bipolar 1 disorder ([place])   . Depression   . Excessive daytime sleepiness 11/18/2017  . GERD (gastroesophageal reflux disease)   . Heart murmur   . Hemorrhoids   . Hiatal hernia   . Hypertension   . Internal hemorrhoids   . Toe injury 11/18/2017         Review of Systems: Review of Systems  Constitutional: Negative for chills, fever and malaise/fatigue.  Eyes: Negative for blurred vision, double vision and photophobia.  Respiratory: Negative for cough and shortness of breath.   Cardiovascular: Negative for chest pain, orthopnea and leg swelling.  Gastrointestinal: Positive for abdominal pain, constipation and nausea.  Musculoskeletal: Positive for joint pain.  Neurological: Negative for dizziness, seizures and weakness.  Psychiatric/Behavioral: Positive for depression. The patient is nervous/anxious.      Vitals:   11/04/19 1600  BP: 128/79  Pulse: 80  Temp: 98.1 F (36.7 C)  TempSrc: Oral  SpO2: 98%  Weight: 213 lb 3.2 oz (96.7 kg)     Physical Exam: Physical Exam  Constitutional: She is oriented to person, place, and time and well-developed, well-nourished, and in no distress.  HENT:  Head: Normocephalic and atraumatic.  Eyes: EOM are normal.  Neck: Normal range of motion.  Cardiovascular: Normal rate, regular rhythm, normal heart sounds and intact distal pulses. Exam reveals no gallop and no friction rub.  No murmur heard. Pulmonary/Chest: Effort normal and breath sounds normal. No respiratory distress. She exhibits no tenderness.  Abdominal: Soft. Bowel sounds are normal. She exhibits no distension. There is  abdominal tenderness.  Musculoskeletal: Normal range of motion.        General: Tenderness (knees with movement) present. No deformity or edema.  Neurological: She is alert and oriented to person, place, and time.  Skin: Skin is warm and dry.  Psychiatric: Judgment normal. Her mood appears anxious. She exhibits a depressed mood. She expresses no homicidal and no suicidal ideation. She expresses no suicidal plans and no homicidal plans.     Assessment & Plan:   See Encounters Tab for problem based charting.  Patient seen with Dr. Philipp Ovens

## 2019-11-07 NOTE — Assessment & Plan Note (Signed)
Patient is on Lin[facility]. Given referral to GI for diagnositc colonoscopy in the setting of worsening constipation and abdominal pain. Patient states that she has already been set up with GI and was hesitant to get a colonoscopy. After talking with her about what to expect, she was agreeable to the referral.

## 2019-11-07 NOTE — Assessment & Plan Note (Signed)
Patient presented with chronic knee pain. Likely 2/2 to osteoarthritis.She states that the knee pain is debilitating and interferes with her ability to walk and her decreases her functional status. Patient has never received imaging to rule out other other etiologies. We will do knee XR today and ask the patient to schedule follow up for knee injections.

## 2019-11-07 NOTE — Assessment & Plan Note (Signed)
Patient is currently on multiple psychiatric medications to manage her anxiety and Bipolar symptoms.She is being managed by a psychiatrist and doing counseling at [facility]. She received once a month counseling session. We spoke about increasing her sessions to twice a month to help her find coping mechanisms for her anxiety and depression. I told her we could set her up with Miquel Dunn at the clinic but she wanted to try increasing her sessions with her counselor first.

## 2019-11-08 NOTE — Progress Notes (Signed)
Internal Medicine Clinic Attending  I saw and evaluated the patient.  I personally confirmed the key portions of the history and exam documented by Dr. Coe and I reviewed pertinent patient test results.  The assessment, diagnosis, and plan were formulated together and I agree with the documentation in the resident's note.    

## 2019-11-09 ENCOUNTER — Telehealth: Payer: Self-pay

## 2019-11-09 NOTE — Telephone Encounter (Signed)
Requesting x-ray results, please call pt back.  

## 2019-11-10 ENCOUNTER — Telehealth: Payer: Self-pay | Admitting: *Deleted

## 2019-11-10 NOTE — Telephone Encounter (Signed)
Agree with plan 

## 2019-11-10 NOTE — Telephone Encounter (Signed)
Pt mother calls and states pt has been "throwing up" since last night, she tried putting zofran under her tongue and even threw that up. She is getting weak.  She is advised to bring pt to urg care or ED asap. She desires clinic appt but is advised to go straight to ED, there are no available appts today.she is again advised to bring pt to ED asap. She states she will do so

## 2019-11-11 ENCOUNTER — Emergency Department (HOSPITAL_COMMUNITY)
Admission: EM | Admit: 2019-11-11 | Discharge: 2019-11-12 | Disposition: A | Discharge status: Home / Self Care | Payer: Medicare HMO | Attending: Emergency Medicine | Admitting: Emergency Medicine

## 2019-11-11 ENCOUNTER — Telehealth: Payer: Self-pay | Admitting: *Deleted

## 2019-11-11 ENCOUNTER — Other Ambulatory Visit: Payer: Self-pay

## 2019-11-11 DIAGNOSIS — R1084 Generalized abdominal pain: Secondary | ICD-10-CM | POA: Diagnosis not present

## 2019-11-11 DIAGNOSIS — Z8709 Personal history of other diseases of the respiratory system: Secondary | ICD-10-CM | POA: Diagnosis not present

## 2019-11-11 DIAGNOSIS — F17210 Nicotine dependence, cigarettes, uncomplicated: Secondary | ICD-10-CM | POA: Diagnosis not present

## 2019-11-11 DIAGNOSIS — R112 Nausea with vomiting, unspecified: Secondary | ICD-10-CM | POA: Diagnosis present

## 2019-11-11 DIAGNOSIS — F319 Bipolar disorder, unspecified: Secondary | ICD-10-CM | POA: Diagnosis not present

## 2019-11-11 DIAGNOSIS — I10 Essential (primary) hypertension: Secondary | ICD-10-CM | POA: Insufficient documentation

## 2019-11-11 DIAGNOSIS — Z79899 Other long term (current) drug therapy: Secondary | ICD-10-CM | POA: Insufficient documentation

## 2019-11-11 DIAGNOSIS — K529 Noninfective gastroenteritis and colitis, unspecified: Secondary | ICD-10-CM | POA: Diagnosis not present

## 2019-11-11 DIAGNOSIS — R000 Tachycardia, unspecified: Secondary | ICD-10-CM | POA: Diagnosis not present

## 2019-11-11 LAB — COMPREHENSIVE METABOLIC PANEL
ALT: 17 U/L (ref 0–44)
AST: 19 U/L (ref 15–41)
Albumin: 4.3 g/dL (ref 3.5–5.0)
Alkaline Phosphatase: 84 U/L (ref 38–126)
Anion gap: 16 — ABNORMAL HIGH (ref 5–15)
BUN: 8 mg/dL (ref 6–20)
CO2: 20 mmol/L — ABNORMAL LOW (ref 22–32)
Calcium: 10 mg/dL (ref 8.9–10.3)
Chloride: 104 mmol/L (ref 98–111)
Creatinine, Ser: 0.86 mg/dL (ref 0.44–1.00)
GFR calc Af Amer: >60 mL/min (ref >60)
GFR calc non Af Amer: >60 mL/min (ref >60)
Glucose, Bld: 110 mg/dL — ABNORMAL HIGH (ref 70–99)
Potassium: 3.3 mmol/L — ABNORMAL LOW (ref 3.5–5.1)
Sodium: 140 mmol/L (ref 135–145)
Total Bilirubin: 0.9 mg/dL (ref 0.3–1.2)
Total Protein: 8.1 g/dL (ref 6.5–8.1)

## 2019-11-11 LAB — CBC
HCT: 45.9 % (ref 36.0–46.0)
Hemoglobin: 15.2 g/dL — ABNORMAL HIGH (ref 12.0–15.0)
MCH: 28.1 pg (ref 26.0–34.0)
MCHC: 33.1 g/dL (ref 30.0–36.0)
MCV: 84.8 fL (ref 80.0–100.0)
Platelets: 442 10*3/uL — ABNORMAL HIGH (ref 150–400)
RBC: 5.41 MIL/uL — ABNORMAL HIGH (ref 3.87–5.11)
RDW: 14.7 % (ref 11.5–15.5)
WBC: 15.9 10*3/uL — ABNORMAL HIGH (ref 4.0–10.5)
nRBC: 0 % (ref 0.0–0.2)

## 2019-11-11 LAB — LIPASE, BLOOD: Lipase: 12 U/L (ref 11–51)

## 2019-11-11 LAB — URINALYSIS, ROUTINE W REFLEX MICROSCOPIC
Bilirubin Urine: NEGATIVE
Glucose, UA: NEGATIVE mg/dL
Ketones, ur: NEGATIVE mg/dL
Leukocytes,Ua: NEGATIVE
Nitrite: NEGATIVE
Protein, ur: 100 mg/dL — AB
Specific Gravity, Urine: 1.023 (ref 1.005–1.030)
pH: 6 (ref 5.0–8.0)

## 2019-11-11 LAB — I-STAT BETA HCG BLOOD, ED (MC, WL, AP ONLY): I-stat hCG, quantitative: <5 m[IU]/mL (ref <5)

## 2019-11-11 MED ORDER — ONDANSETRON 4 MG PO TBDP
4.0000 mg | ORAL_TABLET | Freq: Once | ORAL | Status: AC | PRN
  Administered 2019-11-11: 4 mg via ORAL
  Filled 2019-11-11: qty 1

## 2019-11-11 NOTE — ED Triage Notes (Signed)
Pt here for n/v/d x 2 weeks. Seen for same but can't keep rx for nausea down d/t vomiting.

## 2019-11-11 NOTE — Telephone Encounter (Signed)
Pt calls again today c/o vomiting and watery diarrhea, states she cannot keep anything down, gagging as we speak on phone. States she just didn't think she needed to go to ED 11/18 but is worse. She continues to gag.  She is ask to go to ED asap that she could be getting very ill. It is stressed several times. She ask if she could be treated by phone and is told no that she needs to go to ED. She states she will go today.

## 2019-11-12 ENCOUNTER — Emergency Department (HOSPITAL_COMMUNITY): Payer: Medicare HMO

## 2019-11-12 DIAGNOSIS — K529 Noninfective gastroenteritis and colitis, unspecified: Secondary | ICD-10-CM | POA: Diagnosis not present

## 2019-11-12 MED ORDER — IOHEXOL 300 MG/ML  SOLN
100.0000 mL | Freq: Once | INTRAMUSCULAR | Status: AC | PRN
  Administered 2019-11-12: 100 mL via INTRAVENOUS

## 2019-11-12 MED ORDER — METRONIDAZOLE 500 MG PO TABS: 500.0000 mg | ORAL_TABLET | Freq: Three times a day (TID) | ORAL | 0 refills | Status: DC

## 2019-11-12 MED ORDER — SODIUM CHLORIDE 0.9 % IV BOLUS
1000.0000 mL | Freq: Once | INTRAVENOUS | Status: AC
  Administered 2019-11-12: 1000 mL via INTRAVENOUS

## 2019-11-12 MED ORDER — ONDANSETRON 8 MG PO TBDP: ORAL_TABLET | ORAL | 0 refills | Status: DC

## 2019-11-12 MED ORDER — ONDANSETRON HCL 4 MG/2ML IJ SOLN
4.0000 mg | Freq: Once | INTRAMUSCULAR | Status: AC
  Administered 2019-11-12: 4 mg via INTRAVENOUS
  Filled 2019-11-12: qty 2

## 2019-11-12 NOTE — ED Provider Notes (Signed)
[facility] EMERGENCY DEPARTMENT Provider Note   CSN: [id number] Arrival date & time: 11/11/19  1711     History   Chief Complaint Chief Complaint  Patient presents with  . Nausea  . Emesis  . Diarrhea    HPI Jasmine Jackson is a 48 y.o. female.     Patient is a 48 year old female with past medical history of bipolar, anxiety, hypertension, GERD.  She presents today for evaluation of abdominal cramping, nausea, vomiting, and diarrhea that has been ongoing for 2 weeks.  She was seen here 2 weeks ago for the same and no definitive cause was found.  Patient states over the past 2 weeks her symptoms have persisted and have not gone away.  She denies any bloody stool or vomit.  She denies any fevers or chills.  She denies any ill contacts.  The history is provided by the patient.  Emesis Severity:  Moderate Duration:  2 weeks Timing:  Intermittent Quality:  Stomach contents Progression:  Worsening Chronicity:  New Recent urination:  Normal Relieved by:  Nothing   Past Medical History:  Diagnosis Date  . Anemia   . Anxiety   . Anxiety and depression   . Asthma   . Bipolar 1 disorder ([place])   . Depression   . Excessive daytime sleepiness 11/18/2017  . GERD (gastroesophageal reflux disease)   . Heart murmur   . Hemorrhoids   . Hiatal hernia   . Hypertension   . Internal hemorrhoids   . Toe injury 11/18/2017    Patient Active Problem List   Diagnosis Date Noted  . Nocturnal leg cramps 10/18/2018  . Heel pain, bilateral 09/21/2018  . Bloody diarrhea 05/22/2018  . Knee pain 03/05/2018  . Migraine 11/18/2017  . Snoring 11/18/2017  . Colitis 03/06/2017  . Vaginal discharge 12/25/2016  . Hot flashes 11/27/2016  . Tobacco use  10/18/2016  . Panic attacks 09/09/2016  . Health care maintenance 01/11/2016  . Low back pain 12/02/2015  . Hypertension 04/24/2015  . Muscle spasm 04/24/2015  . Bipolar disorder ([place]) 04/16/2014  . History of gout  04/01/2014  . Internal hemorrhoids without complication [id number]  . Irritable bowel syndrome 12/08/2013  . Esophageal reflux 12/08/2013    Past Surgical History:  Procedure Laterality Date  . ABDOMINAL HYSTERECTOMY     fibroids  . HEMORRHOID SURGERY    . INGUINAL HERNIA REPAIR    . TONSILLECTOMY    . TUBAL LIGATION       OB History   No obstetric history on file.      Home Medications    Prior to Admission medications   Medication Sig Start Date End Date Taking? Authorizing Provider  albuterol (VENTOLIN HFA) 108 (90 Base) MCG/ACT inhaler Inhale 2 puffs into the lungs every 6 (six) hours as needed for wheezing or shortness of breath. 10/22/19   Marianna Payment, MD  amLODipine (NORVASC) 5 MG tablet TAKE 1 TABLET(5 MG) BY MOUTH DAILY 07/17/19   Marianna Payment, MD  busPIRone (BUSPAR) 15 MG tablet Take 30 mg by mouth 2 (two) times daily.    [provider]  dicyclomine (BENTYL) 20 MG tablet Take 0.5 tablets (10 mg total) by mouth 4 (four) times daily -  before meals and at bedtime. 10/21/18   Isabelle Course, MD  FLUoxetine (PROZAC) 40 MG capsule Take 2 capsules (80 mg total) by mouth every morning. 01/01/18   Jule Ser, DO  gabapentin (NEURONTIN) 800 MG tablet Take 800 mg  by mouth 3 (three) times daily. 04/01/16   [provider]  hydrochlorothiazide (HYDRODIURIL) 25 MG tablet TAKE 1 TABLET(25 MG) BY MOUTH DAILY 10/06/19   Marianna Payment, MD  hydrOXYzine (ATARAX/VISTARIL) 50 MG tablet Take 50 mg by mouth 3 (three) times daily. 02/13/17   [provider]  linaclotide Rolan Lipa) 145 MCG CAPS capsule Take 2 capsules (290 mcg total) by mouth daily before breakfast. 04/07/19   Ladene Artist, MD  metoCLOPramide (REGLAN) 10 MG tablet Take 1 tablet (10 mg total) by mouth every 12 (twelve) hours as needed for nausea. 11/07/19 11/06/20  Marianna Payment, MD  omeprazole (PRILOSEC) 40 MG capsule Take 1 capsule (40 mg total) by mouth 2 (two) times daily. 02/23/19   Ladene Artist, MD  ondansetron (ZOFRAN) 4 MG tablet TAKE 1 TABLET(4 MG) BY MOUTH EVERY 8 HOURS AS NEEDED FOR NAUSEA OR VOMITING 07/08/19   Santos-Sanchez, Merlene Morse, MD  ondansetron (ZOFRAN-ODT) 4 MG disintegrating tablet DISSOLVE 1 TABLET(4 MG) ON THE TONGUE EVERY 8 HOURS AS NEEDED FOR NAUSEA OR VOMITING 05/25/18   Jule Ser, DO  polyethylene glycol (MIRALAX / GLYCOLAX) packet Take 17 g by mouth daily. 04/26/17   Rice, Resa Miner, MD  promethazine (PHENERGAN) 25 MG suppository Place 1 suppository (25 mg total) rectally every 6 (six) hours as needed for nausea or vomiting. 10/16/19   Tedd Sias, PA  QUEtiapine (SEROQUEL XR) 400 MG 24 hr tablet Take 3 tablets (1,200 mg total) by mouth at bedtime. 01/01/18   Jule Ser, DO  QUEtiapine (SEROQUEL) 400 MG tablet Take 200 mg by mouth daily. At 8 pm 10/10/17   [provider]  ranitidine (ZANTAC) 300 MG tablet Take 1 tablet (300 mg total) by mouth daily. 10/21/18   Isabelle Course, MD  tiZANidine (ZANAFLEX) 4 MG tablet Take 0.5 tablets (2 mg total) by mouth 3 (three) times daily for 4 days, THEN 1 tablet (4 mg total) 3 (three) times daily. 04/08/19 04/11/20  Isabelle Course, MD    Family History Family History  Problem Relation Age of Onset  . Breast cancer Maternal Aunt        x4  . Stomach cancer Maternal Aunt   . Prostate cancer Maternal Uncle        x2  . Breast cancer Maternal Grandmother   . Celiac disease Mother   . Breast cancer Mother 95  . Breast cancer Paternal Grandmother   . Colon cancer Neg Hx   . Esophageal cancer Neg Hx   . Rectal cancer Neg Hx     Social History Social History   Tobacco Use  . Smoking status: Current Every Day Smoker    Packs/day: 0.75    Types: Cigarettes  . Smokeless tobacco: Never Used  . Tobacco comment: 15 CIG A DAY  Substance Use Topics  . Alcohol use: No    Alcohol/week: 0.0 standard drinks  . Drug use: No     Allergies   Asa [aspirin] and Banana   Review of Systems Review  of Systems  All other systems reviewed and are negative.    Physical Exam Updated Vital Signs BP 136/88   Pulse 100   Temp 98.8 F (37.1 C) (Oral)   Resp 18   SpO2 100%   Physical Exam Vitals signs and nursing note reviewed.  Constitutional:      General: She is not in acute distress.    Appearance: She is well-developed. She is not diaphoretic.  HENT:  Head: Normocephalic and atraumatic.  Neck:     Musculoskeletal: Normal range of motion and neck supple.  Cardiovascular:     Rate and Rhythm: Normal rate and regular rhythm.     Heart sounds: No murmur. No friction rub. No gallop.   Pulmonary:     Effort: Pulmonary effort is normal. No respiratory distress.     Breath sounds: Normal breath sounds. No wheezing.  Abdominal:     General: Bowel sounds are normal. There is no distension.     Palpations: Abdomen is soft.     Tenderness: There is abdominal tenderness. There is no right CVA tenderness, left CVA tenderness, guarding or rebound.     Comments: There is mild generalized tenderness.  Musculoskeletal: Normal range of motion.  Skin:    General: Skin is warm and dry.  Neurological:     Mental Status: She is alert and oriented to person, place, and time.      ED Treatments / Results  Labs (all labs ordered are listed, but only abnormal results are displayed) Labs Reviewed  COMPREHENSIVE METABOLIC PANEL - Abnormal; Notable for the following components:      Result Value   Potassium 3.3 (*)    CO2 20 (*)    Glucose, Bld 110 (*)    Anion gap 16 (*)    All other components within normal limits  CBC - Abnormal; Notable for the following components:   WBC 15.9 (*)    RBC 5.41 (*)    Hemoglobin 15.2 (*)    Platelets 442 (*)    All other components within normal limits  URINALYSIS, ROUTINE W REFLEX MICROSCOPIC - Abnormal; Notable for the following components:   APPearance HAZY (*)    Hgb urine dipstick SMALL (*)    Protein, ur 100 (*)    Bacteria, UA RARE (*)     All other components within normal limits  LIPASE, BLOOD  I-STAT BETA HCG BLOOD, ED ([facility], [facility], [facility] ONLY)    EKG None  Radiology No results found.  Procedures Procedures (including critical care time)  Medications Ordered in ED Medications  ondansetron (ZOFRAN) injection 4 mg (has no administration in time range)  sodium chloride 0.9 % bolus 1,000 mL (has no administration in time range)  ondansetron (ZOFRAN-ODT) disintegrating tablet 4 mg (4 mg Oral Given 11/11/19 1727)     Initial Impression / Assessment and Plan / ED Course  I have reviewed the triage vital signs and the nursing notes.  Pertinent labs & imaging results that were available during my care of the patient were reviewed by me and considered in my medical decision making (see chart for details).  Patient is a 48 year old female presenting with a 2-week history of persistent nausea, vomiting, and diarrhea.  She was seen on 1 prior occasion and symptoms felt to be viral in nature.  She is not improving.  She continues to have a white count, but unremarkable work-up otherwise.  Her CT scan is negative.  At this point, patient will be treated with Flagyl for presumptive infectious diarrhea due to the prolonged nature of her symptoms, Zofran for nausea, and is to follow-up with her GI doctor if she is not improving in the next week.  Final Clinical Impressions(s) / ED Diagnoses   Final diagnoses:  None    ED Discharge Orders    None       Veryl Speak, MD 11/12/19 [phone]

## 2019-11-12 NOTE — Telephone Encounter (Signed)
Called the patient. She states that her nausea is improving on the medication she was given at the ED. She has an appointment on Tuesday in [facility]. We will follow up with her about her abdominal pain. Referral has already been submitted for GI.   Marianna Payment, D.O. Date 11/12/2019 Time 10:11 AM [facility] Internal Medicine, PGY-1 Pager: [phone]

## 2019-11-12 NOTE — Discharge Instructions (Signed)
Begin taking Flagyl as prescribed.  Take Zofran as prescribed as needed for nausea.  Follow-up with your gastroenterologist if your symptoms or not improving in the next 3 to 4 days, and return to the ER if your symptoms significantly worsen or change.

## 2019-11-12 NOTE — ED Notes (Signed)
Pt verbalized understanding of d/c instruction, follow up care, prescriptions and s/s requiring return to ed. Pt had no further questions at this time

## 2019-11-16 ENCOUNTER — Encounter: Payer: Self-pay | Admitting: Internal Medicine

## 2019-11-16 ENCOUNTER — Other Ambulatory Visit: Payer: Self-pay

## 2019-11-16 ENCOUNTER — Ambulatory Visit (INDEPENDENT_AMBULATORY_CARE_PROVIDER_SITE_OTHER): Payer: Medicare HMO | Admitting: Internal Medicine

## 2019-11-16 VITALS — BP 127/80 | HR 84 | Temp 98.4°F | Wt 213.3 lb

## 2019-11-16 DIAGNOSIS — M25562 Pain in left knee: Secondary | ICD-10-CM | POA: Diagnosis not present

## 2019-11-16 DIAGNOSIS — L83 Acanthosis nigricans: Secondary | ICD-10-CM

## 2019-11-16 DIAGNOSIS — G8929 Other chronic pain: Secondary | ICD-10-CM

## 2019-11-16 DIAGNOSIS — Z131 Encounter for screening for diabetes mellitus: Secondary | ICD-10-CM

## 2019-11-16 DIAGNOSIS — M25561 Pain in right knee: Secondary | ICD-10-CM

## 2019-11-16 LAB — POCT GLYCOSYLATED HEMOGLOBIN (HGB A1C): Hemoglobin A1C: 5.4 % (ref 4.0–5.6)

## 2019-11-16 LAB — GLUCOSE, CAPILLARY: Glucose-Capillary: 79 mg/dL (ref 70–99)

## 2019-11-16 NOTE — Assessment & Plan Note (Signed)
Darkening of skin of neck consistent with acanthosis nigricans. Patient last HgbA1C of 5.5 in 2016. Rechecked today and value of 5.4.

## 2019-11-16 NOTE — Assessment & Plan Note (Addendum)
Patient reporting bilateral knee pain, sharp, worse with use and frequently wakes patient at night.  Patient reports that the pain has been gradually worsening over several years but has been acutely worse over the past 2 months.  Patient's pain significantly limits her ability to walk long distances.  Patient denies inciting trauma, fever, chills, significant joint swelling, redness.  Knee radiographs obtained at last visit do not show evidence of osteoarthritis.  However, the views acquired were not optimal.  Patient's pain history sounds most consistent with an osteoarthritis.  Steroid joint injection performed in left knee today.  Will assess response to this intervention and consider right knee joint injection if patient has significant unilateral improvement.  Patient provided with temporary 53-month handicap placard.  May need longer-term placard if symptoms are unable to be controlled.

## 2019-11-16 NOTE — Patient Instructions (Signed)
You were seen today for knee pain. We have performed a joint injection in your left knee. We will call you to see how you are doing after this injection.  We have also collected blood work to screen you for diabetes. We will call you with the result of this test.  Thank you for allowing [place] to be part of your medical care!

## 2019-11-16 NOTE — Progress Notes (Signed)
   CC: Knee pain  HPI: Patient is a 48 year old female with past medical history as below who presents for bilateral knee pain.  Ms.Cosima R Quinney is a 48 y.o.   Past Medical History:  Diagnosis Date  . Anemia   . Anxiety   . Anxiety and depression   . Asthma   . Bipolar 1 disorder ([place])   . Depression   . Excessive daytime sleepiness 11/18/2017  . GERD (gastroesophageal reflux disease)   . Heart murmur   . Hemorrhoids   . Hiatal hernia   . Hypertension   . Internal hemorrhoids   . Toe injury 11/18/2017   Review of Systems:   Review of Systems  Constitutional: Negative for chills and fever.  HENT: Negative for congestion.   Respiratory: Negative for cough and shortness of breath.   Cardiovascular: Negative for chest pain.  Gastrointestinal: Negative for abdominal pain, constipation, diarrhea, nausea and vomiting.  Genitourinary: Negative for dysuria, frequency and urgency.  Musculoskeletal: Positive for joint pain.  All other systems reviewed and are negative.   Physical Exam:  Vitals:   11/16/19 1437  BP: 127/80  Pulse: 84  Temp: 98.4 F (36.9 C)  TempSrc: Oral  SpO2: 100%  Weight: 213 lb 4.8 oz (96.8 kg)   Physical Exam  Constitutional: She is well-developed, well-nourished, and in no distress.  HENT:  Head: Normocephalic and atraumatic.  Eyes: EOM are normal. Right eye exhibits no discharge. Left eye exhibits no discharge.  Neck: Normal range of motion. No tracheal deviation present.  Cardiovascular: Normal rate and regular rhythm. Exam reveals no gallop and no friction rub.  No murmur heard. Pulmonary/Chest: Effort normal and breath sounds normal. No respiratory distress. She has no wheezes. She has no rales.  Abdominal: Soft. She exhibits no distension. There is no abdominal tenderness. There is no rebound and no guarding.  Musculoskeletal: Normal range of motion.        General: No tenderness, deformity or edema.     Comments: No crepitus, swelling,  erythema, edema, or excessive joint laxity of bilateral knees  Neurological: She is alert. Coordination normal.  Skin: Skin is warm and dry. No rash noted. She is not diaphoretic. No erythema.  Psychiatric: Memory and judgment normal.   Knee Injection Procedure Note  Diagnosis: bilateral knee osteoarthritis  Indications: Symptom relief from osteoarthritis  Anesthesia: Lidocaine 1% without epinephrine  Procedure Details   Point of care ultrasound was used to evaluate the knee and NO joint effusion adequate for aspiration was present. Point of care ultrasound was also used to plan needle trajectory and depth. Consent was obtained for the procedure. The joint was prepped with Betadine. A 22 gauge needle was inserted into the superior aspect of the joint from a medial approach to access the suprapatellar pouch.  2 ml 1% lidocaine and 40 mg of Triamcinolone was then injected into the joint through the same needle. The needle was removed and the area cleansed and dressed.  Complications:  None; patient tolerated the procedure well.   Assessment & Plan:   See Encounters Tab for problem based charting.  Patient seen and discussed with Dr. Evette Doffing

## 2019-11-17 NOTE — Progress Notes (Signed)
Internal Medicine Clinic Attending  I saw and evaluated the patient.  I personally confirmed the key portions of the history and exam documented by Dr. Benjamine Mola and I reviewed pertinent patient test results.  The assessment, diagnosis, and plan were formulated together and I agree with the documentation in the resident's note. I was present for the entirety of the procedure.

## 2019-11-24 ENCOUNTER — Telehealth: Payer: Self-pay | Admitting: Gastroenterology

## 2019-11-24 NOTE — Telephone Encounter (Signed)
Patient scheduled for 1/5 for hemorrhoid banding.

## 2019-11-29 ENCOUNTER — Ambulatory Visit: Payer: Medicare HMO

## 2019-11-30 ENCOUNTER — Encounter: Payer: Self-pay | Admitting: Internal Medicine

## 2019-12-18 ENCOUNTER — Other Ambulatory Visit: Payer: Self-pay | Admitting: Internal Medicine

## 2019-12-18 DIAGNOSIS — K582 Mixed irritable bowel syndrome: Secondary | ICD-10-CM

## 2019-12-18 MED ORDER — METOCLOPRAMIDE HCL 10 MG PO TABS: 10.0000 mg | ORAL_TABLET | Freq: Two times a day (BID) | ORAL | 0 refills | Status: DC | PRN

## 2019-12-18 NOTE — Progress Notes (Signed)
Patient called hospital requesting refill on metoclopramide.  This was previously prescribed for patient's IBS.  Patient denies new symptoms, systemic symptoms, but has persistent nausea now that medication has run out. Will send short-term refill of medication  Jeanmarie Hubert, MD 12/18/2019, 2:01 PM Pager: [phone]

## 2019-12-24 HISTORY — PX: COLONOSCOPY: SHX174

## 2019-12-27 ENCOUNTER — Telehealth: Payer: Self-pay | Admitting: Pharmacist

## 2019-12-27 NOTE — Telephone Encounter (Signed)
Pt is requesting Mannie Stabile to call her back if possible.  Pt states she is out of her Essential Oil Inhaler.

## 2019-12-28 ENCOUNTER — Encounter: Payer: Medicare HMO | Admitting: Gastroenterology

## 2019-12-28 ENCOUNTER — Telehealth: Payer: Self-pay | Admitting: *Deleted

## 2019-12-28 NOTE — Telephone Encounter (Signed)
called pt and advised she can pick up some comfort blend aromatherapy, states she will come in wed 1/6 This is per dr Maudie Mercury

## 2019-12-28 NOTE — Progress Notes (Signed)
Notified patient we may have aromatherapy samples, will work with clinic to coordinate. She requested a sample without frankincense, and is hoping to pick up tomorrow afternoon.  Patient was also advised to schedule PCP appointment which is due in February. Patient verbalized understanding.

## 2019-12-30 ENCOUNTER — Telehealth: Payer: Self-pay | Admitting: *Deleted

## 2019-12-30 NOTE — Telephone Encounter (Signed)
Pt walked in asking for "essential oil Dr. Maudie Mercury gives me". Upon further questioning, she was requesting 2 of the COMFORT Blend inhalers. Pt signed consent form, and was given a copy of the form after verbalizing understanding of proper use of the inhalers,  and was given 2 comfort blend inhalers. She saw another pt walking out with a gift blanket and requested one for herself, so blanket also given to her.- approx 10:45A. Yvonna Alanis, RN, 12/30/19, 11:26A

## 2020-01-03 ENCOUNTER — Encounter: Payer: Medicare HMO | Admitting: Gastroenterology

## 2020-01-03 NOTE — Addendum Note (Signed)
Addended by: Hulan Fray on: 01/03/2020 06:41 PM   Modules accepted: Orders

## 2020-01-05 ENCOUNTER — Other Ambulatory Visit: Payer: Self-pay | Admitting: *Deleted

## 2020-01-05 MED ORDER — ALBUTEROL SULFATE HFA 108 (90 BASE) MCG/ACT IN AERS: 2.0000 | INHALATION_SPRAY | Freq: Four times a day (QID) | RESPIRATORY_TRACT | 2 refills | Status: DC | PRN

## 2020-01-05 MED ORDER — HYDROCHLOROTHIAZIDE 25 MG PO TABS: ORAL_TABLET | ORAL | 0 refills | Status: DC

## 2020-01-13 ENCOUNTER — Other Ambulatory Visit: Payer: Self-pay

## 2020-01-15 ENCOUNTER — Other Ambulatory Visit: Payer: Self-pay | Admitting: Gastroenterology

## 2020-01-18 MED ORDER — DICYCLOMINE HCL 20 MG PO TABS: 10.0000 mg | ORAL_TABLET | Freq: Three times a day (TID) | ORAL | 1 refills | Status: DC

## 2020-02-04 ENCOUNTER — Other Ambulatory Visit: Payer: Self-pay | Admitting: *Deleted

## 2020-02-04 ENCOUNTER — Ambulatory Visit: Payer: Medicare Other | Admitting: Podiatry

## 2020-02-04 DIAGNOSIS — M62838 Other muscle spasm: Secondary | ICD-10-CM

## 2020-02-04 MED ORDER — GABAPENTIN 800 MG PO TABS: 800.0000 mg | ORAL_TABLET | Freq: Three times a day (TID) | ORAL | 0 refills | Status: DC

## 2020-02-04 MED ORDER — DICYCLOMINE HCL 20 MG PO TABS: 10.0000 mg | ORAL_TABLET | Freq: Three times a day (TID) | ORAL | 1 refills | Status: DC

## 2020-02-04 MED ORDER — TIZANIDINE HCL 4 MG PO TABS: ORAL_TABLET | ORAL | 2 refills | Status: DC

## 2020-02-04 MED ORDER — AMLODIPINE BESYLATE 5 MG PO TABS: ORAL_TABLET | ORAL | 1 refills | Status: DC

## 2020-02-04 MED ORDER — OMEPRAZOLE 40 MG PO CPDR: DELAYED_RELEASE_CAPSULE | ORAL | 0 refills | Status: DC

## 2020-02-04 MED ORDER — HYDROCHLOROTHIAZIDE 25 MG PO TABS: ORAL_TABLET | ORAL | 0 refills | Status: DC

## 2020-02-08 ENCOUNTER — Other Ambulatory Visit: Payer: Self-pay

## 2020-02-08 ENCOUNTER — Ambulatory Visit (INDEPENDENT_AMBULATORY_CARE_PROVIDER_SITE_OTHER): Payer: Medicare HMO | Admitting: Internal Medicine

## 2020-02-08 ENCOUNTER — Encounter: Payer: Self-pay | Admitting: Internal Medicine

## 2020-02-08 DIAGNOSIS — M25562 Pain in left knee: Secondary | ICD-10-CM | POA: Diagnosis not present

## 2020-02-08 DIAGNOSIS — M25561 Pain in right knee: Secondary | ICD-10-CM | POA: Diagnosis not present

## 2020-02-08 DIAGNOSIS — G8929 Other chronic pain: Secondary | ICD-10-CM | POA: Diagnosis not present

## 2020-02-08 DIAGNOSIS — K582 Mixed irritable bowel syndrome: Secondary | ICD-10-CM

## 2020-02-08 DIAGNOSIS — M170 Bilateral primary osteoarthritis of knee: Secondary | ICD-10-CM

## 2020-02-08 MED ORDER — METOCLOPRAMIDE HCL 10 MG PO TABS: 10.0000 mg | ORAL_TABLET | Freq: Two times a day (BID) | ORAL | 0 refills | Status: DC | PRN

## 2020-02-08 NOTE — Assessment & Plan Note (Signed)
Bilateral knee pain (OA): Her las steroid injection was 11/16/2019 and it significantly improved her pain. She still endorses B/L Knee pain that is worse when the weather is cold and also with ambulation  Right knee After consent was obtained, using sterile technique the Lateral right knee was prepped and topical anesthetic was applied. The joint was entered.  kenalog 40 mg (2mL) and 0.5 ml plain Lidocaine was then injected and the needle withdrawn.  The procedure was well tolerated.  The patient is asked to continue to rest the joint for a few more days before resuming regular activities.  It may be more painful for the first 1-2 days.  Watch for fever, or increased swelling or persistent pain in the joint. Call or return to clinic prn if such symptoms occur or there is failure to improve as anticipated.  Left knee After consent was obtained, using sterile technique the Lateral left knee was prepped and topical anesthetic was applied. The joint was entered.  kenalog 40 mg (3mL) and 0.5 ml plain Lidocaine was then injected and the needle withdrawn.  The procedure was well tolerated.  The patient is asked to continue to rest the joint for a few more days before resuming regular activities.  It may be more painful for the first 1-2 days.  Watch for fever, or increased swelling or persistent pain in the joint. Call or return to clinic prn if such symptoms occur or there is failure to improve as anticipated.

## 2020-02-08 NOTE — Progress Notes (Signed)
   CC: Bilateral knee pain, knee injection   HPI:  [name] is a 49 y.o. with history of OA of the knee here for intra-articular injection.  Please see problem based charting for further details.   Past Medical History:  Diagnosis Date  . Anemia   . Anxiety   . Anxiety and depression   . Asthma   . Bipolar 1 disorder ([place])   . Depression   . Excessive daytime sleepiness 11/18/2017  . GERD (gastroesophageal reflux disease)   . Heart murmur   . Hemorrhoids   . Hiatal hernia   . Hypertension   . Internal hemorrhoids   . Toe injury 11/18/2017   Review of Systems:  As per HPI  Physical Exam:  Vitals:   02/08/20 1419 02/08/20 1421  BP:  127/70  Pulse:  82  Temp:  98.5 F (36.9 C)  TempSrc:  Oral  SpO2:  100%  Weight: 216 lb (98 kg)   Height: 5\' 6"  (1.676 m)    Physical Exam  Constitutional: She is well-developed, well-nourished, and in no distress.  Musculoskeletal:     Right knee: Normal. No swelling, deformity, effusion, erythema, ecchymosis or lacerations. Normal range of motion. No tenderness.     Left knee: Normal. No swelling, deformity, effusion, erythema, ecchymosis or lacerations. Normal range of motion. No tenderness.    Assessment & Plan:   See Encounters Tab for problem based charting.  Patient seen with Dr. Heber [place]

## 2020-02-08 NOTE — Patient Instructions (Signed)
Ms. Hartvigsen,   Thanks for seeing [place] today. We did a knee injection today. Please let us to if you experience fevers, chills, severe pain.   Take care! Dr. Eileen Stanford  Please call the internal medicine center clinic if you have any questions or concerns, we may be able to help and keep you from a long and expensive emergency room wait. Our clinic and after hours phone number is [phone], the best time to call is Monday through Friday 9 am to 4 pm but there is always someone available 24/7 if you have an emergency. If you need medication refills please notify your pharmacy one week in advance and they will send [place] a request.

## 2020-02-09 ENCOUNTER — Ambulatory Visit: Payer: Medicare HMO | Admitting: Podiatry

## 2020-02-12 ENCOUNTER — Other Ambulatory Visit: Payer: Self-pay | Admitting: Gastroenterology

## 2020-02-16 NOTE — Progress Notes (Signed)
Internal Medicine Clinic Attending  I saw and evaluated the patient.  I personally confirmed the key portions of the history and exam documented by Dr. Eileen Stanford and I reviewed pertinent patient test results.  The assessment, diagnosis, and plan were formulated together and I agree with the documentation in the resident's note.   I was present for both knee injections.

## 2020-02-17 ENCOUNTER — Other Ambulatory Visit: Payer: Self-pay | Admitting: Internal Medicine

## 2020-02-18 ENCOUNTER — Ambulatory Visit: Payer: Medicare HMO | Admitting: Podiatry

## 2020-02-24 ENCOUNTER — Other Ambulatory Visit: Payer: Self-pay

## 2020-02-24 MED ORDER — LINACLOTIDE 290 MCG PO CAPS: 290.0000 ug | ORAL_CAPSULE | Freq: Every day | ORAL | 0 refills | Status: DC

## 2020-02-27 ENCOUNTER — Other Ambulatory Visit: Payer: Self-pay | Admitting: Internal Medicine

## 2020-02-29 ENCOUNTER — Other Ambulatory Visit: Payer: Self-pay | Admitting: *Deleted

## 2020-02-29 ENCOUNTER — Other Ambulatory Visit: Payer: Self-pay

## 2020-02-29 DIAGNOSIS — K582 Mixed irritable bowel syndrome: Secondary | ICD-10-CM

## 2020-02-29 MED ORDER — LINACLOTIDE 290 MCG PO CAPS: 290.0000 ug | ORAL_CAPSULE | Freq: Every day | ORAL | 0 refills | Status: DC

## 2020-02-29 NOTE — Telephone Encounter (Signed)
Patients Linzess refilled and note attached to call for her yearly appointment.

## 2020-03-01 MED ORDER — METOCLOPRAMIDE HCL 10 MG PO TABS: 10.0000 mg | ORAL_TABLET | Freq: Two times a day (BID) | ORAL | 0 refills | Status: DC | PRN

## 2020-03-01 MED ORDER — ONDANSETRON 8 MG PO TBDP: ORAL_TABLET | ORAL | 0 refills | Status: DC

## 2020-03-08 MED ORDER — ONDANSETRON 8 MG PO TBDP: ORAL_TABLET | ORAL | 0 refills | Status: DC

## 2020-03-08 NOTE — Addendum Note (Signed)
Addended by: Velora Heckler on: 03/08/2020 10:43 AM   Modules accepted: Orders

## 2020-03-09 ENCOUNTER — Other Ambulatory Visit: Payer: Self-pay

## 2020-03-09 ENCOUNTER — Ambulatory Visit (INDEPENDENT_AMBULATORY_CARE_PROVIDER_SITE_OTHER): Payer: Medicare HMO | Admitting: Podiatry

## 2020-03-09 DIAGNOSIS — M2142 Flat foot [pes planus] (acquired), left foot: Secondary | ICD-10-CM | POA: Diagnosis not present

## 2020-03-09 DIAGNOSIS — M7661 Achilles tendinitis, right leg: Secondary | ICD-10-CM

## 2020-03-09 DIAGNOSIS — M7662 Achilles tendinitis, left leg: Secondary | ICD-10-CM | POA: Diagnosis not present

## 2020-03-09 DIAGNOSIS — M2141 Flat foot [pes planus] (acquired), right foot: Secondary | ICD-10-CM

## 2020-03-09 DIAGNOSIS — M779 Enthesopathy, unspecified: Secondary | ICD-10-CM | POA: Diagnosis not present

## 2020-03-09 MED ORDER — MELOXICAM 15 MG PO TABS: 15.0000 mg | ORAL_TABLET | Freq: Every day | ORAL | 0 refills | Status: DC

## 2020-03-09 NOTE — Patient Instructions (Signed)
Meloxicam tablets- TAKE AS NEEDED What is this medicine? MELOXICAM (mel OX i cam) is a non-steroidal anti-inflammatory drug (NSAID). It is used to reduce swelling and to treat pain. It may be used for osteoarthritis, rheumatoid arthritis, or juvenile rheumatoid arthritis. This medicine may be used for other purposes; ask your health care provider or pharmacist if you have questions. COMMON BRAND NAME(S): Mobic What should I tell my health care provider before I take this medicine? They need to know if you have any of these conditions:  bleeding disorders  cigarette smoker  coronary artery bypass graft (CABG) surgery within the past 2 weeks  drink more than 3 alcohol-containing drinks per day  heart disease  high blood pressure  history of stomach bleeding  kidney disease  liver disease  lung or breathing disease, like asthma  stomach or intestine problems  an unusual or allergic reaction to meloxicam, aspirin, other NSAIDs, other medicines, foods, dyes, or preservatives  pregnant or trying to get pregnant  breast-feeding How should I use this medicine? Take this medicine by mouth with a full glass of water. Follow the directions on the prescription label. You can take it with or without food. If it upsets your stomach, take it with food. Take your medicine at regular intervals. Do not take it more often than directed. Do not stop taking except on your doctor's advice. A special MedGuide will be given to you by the pharmacist with each prescription and refill. Be sure to read this information carefully each time. Talk to your pediatrician regarding the use of this medicine in children. While this drug may be prescribed for selected conditions, precautions do apply. Patients over 12 years old may have a stronger reaction and need a smaller dose. Overdosage: If you think you have taken too much of this medicine contact a poison control center or emergency room at once. NOTE:  This medicine is only for you. Do not share this medicine with others. What if I miss a dose? If you miss a dose, take it as soon as you can. If it is almost time for your next dose, take only that dose. Do not take double or extra doses. What may interact with this medicine? Do not take this medicine with any of the following medications:  cidofovir  ketorolac This medicine may also interact with the following medications:  aspirin and aspirin-like medicines  certain medicines for blood pressure, heart disease, irregular heart beat  certain medicines for depression, anxiety, or psychotic disturbances  certain medicines that treat or prevent blood clots like warfarin, enoxaparin, dalteparin, apixaban, dabigatran, rivaroxaban  cyclosporine  diuretics  fluconazole  lithium  methotrexate  other NSAIDs, medicines for pain and inflammation, like ibuprofen and naproxen  pemetrexed This list may not describe all possible interactions. Give your health care provider a list of all the medicines, herbs, non-prescription drugs, or dietary supplements you use. Also tell them if you smoke, drink alcohol, or use illegal drugs. Some items may interact with your medicine. What should I watch for while using this medicine? Tell your doctor or healthcare provider if your symptoms do not start to get better or if they get worse. This medicine may cause serious skin reactions. They can happen weeks to months after starting the medicine. Contact your healthcare provider right away if you notice fevers or flu-like symptoms with a rash. The rash may be red or purple and then turn into blisters or peeling of the skin. Or, you might notice a  red rash with swelling of the face, lips or lymph nodes in your neck or under your arms. Do not take other medicines that contain aspirin, ibuprofen, or naproxen with this medicine. Side effects such as stomach upset, nausea, or ulcers may be more likely to occur.  Many medicines available without a prescription should not be taken with this medicine. This medicine can cause ulcers and bleeding in the stomach and intestines at any time during treatment. This can happen with no warning and may cause death. There is increased risk with taking this medicine for a long time. Smoking, drinking alcohol, older age, and poor health can also increase risks. Call your doctor right away if you have stomach pain or blood in your vomit or stool. This medicine does not prevent heart attack or stroke. In fact, this medicine may increase the chance of a heart attack or stroke. The chance may increase with longer use of this medicine and in people who have heart disease. If you take aspirin to prevent heart attack or stroke, talk with your doctor or healthcare provider. What side effects may I notice from receiving this medicine? Side effects that you should report to your doctor or health care professional as soon as possible:  allergic reactions like skin rash, itching or hives, swelling of the face, lips, or tongue  nausea, vomiting  redness, blistering, peeling, or loosening of the skin, including inside the mouth  signs and symptoms of a blood clot such as breathing problems; changes in vision; chest pain; severe, sudden headache; pain, swelling, warmth in the leg; trouble speaking; sudden numbness or weakness of the face, arm, or leg  signs and symptoms of bleeding such as bloody or black, tarry stools; red or dark-brown urine; spitting up blood or brown material that looks like coffee grounds; red spots on the skin; unusual bruising or bleeding from the eye, gums, or nose  signs and symptoms of liver injury like dark yellow or brown urine; general ill feeling or flu-like symptoms; light-colored stools; loss of appetite; nausea; right upper belly pain; unusually weak or tired; yellowing of the eyes or skin  signs and symptoms of stroke like changes in vision; confusion;  trouble speaking or understanding; severe headaches; sudden numbness or weakness of the face, arm, or leg; trouble walking; dizziness; loss of balance or coordination Side effects that usually do not require medical attention (report to your doctor or health care professional if they continue or are bothersome):  constipation  diarrhea  gas This list may not describe all possible side effects. Call your doctor for medical advice about side effects. You may report side effects to FDA at 1-800-FDA-1088. Where should I keep my medicine? Keep out of the reach of children. Store at room temperature between 15 and 30 degrees C (59 and 86 degrees F). Throw away any unused medicine after the expiration date. NOTE: This sheet is a summary. It may not cover all possible information. If you have questions about this medicine, talk to your doctor, pharmacist, or health care provider.  2020 Elsevier/Gold Standard (2019-03-10 11:21:28)

## 2020-03-13 NOTE — Progress Notes (Signed)
Subjective: 49 year old female presents the office today for concerns of right foot and ankle pain.  She describes pain to the lateral aspect of the ankle and she points on sinus tarsi.  She also had an injection about 1 year ago performed by Dr. Paulla Dolly and seems to be more along the Achilles tendon insertion which did well for about 1 month.  She denies any recent injury or trauma.  Pain started 3 to 4 days ago and is gradually getting worse.  Describes any/10 pain at times.  No recent injury. Denies any systemic complaints such as fevers, chills, nausea, vomiting. No acute changes since last appointment, and no other complaints at this time.   Objective: AAO x3, NAD DP/PT pulses palpable bilaterally, CRT less than 3 seconds Flatfoot is present.  Majority of tenderness is on the lateral aspect of the right foot on sinus tarsi.  Mild discomfort in the course of peroneal tendons and minimally to the insertion of Achilles tendon.  Thompson test is negative.  Flexor, extensor tendons appear to be intact.  No other areas of discomfort identified. No open lesions or pre-ulcerative lesions.  No pain with calf compression, swelling, warmth, erythema  Assessment: Capsulitis right sinus tarsi without deformity, tendinitis.  Plan: -All treatment options discussed with the patient including all alternatives, risks, complications.  -Steroid injection for the right side.  See procedure note below.  Discussed traction, icing daily.  Power steps dispensed. [place] for now and once improved then can start the powersteps and discussed supportive shoe. Prescribed mobic. Discussed side effects of the medication and directed to stop if any are to occur and call the office.  -Patient encouraged to call the office with any questions, concerns, change in symptoms.   Procedure: Injection Intermediate Joint Discussed alternatives, risks, complications and verbal consent was obtained.  Location: Right sinus tarsi.  Skin  Prep: Betadine. Injectate: 0.5cc 0.5% marcaine plain, 0.5 cc 2% lidocaine plain and, 1 cc kenalog 10. Disposition: Patient tolerated procedure well. Injection site dressed with a band-aid.  Post-injection care was discussed and return precautions discussed.   Trula Slade DPM

## 2020-03-14 ENCOUNTER — Telehealth: Payer: Self-pay | Admitting: Podiatry

## 2020-03-14 NOTE — Telephone Encounter (Signed)
Pt left message on 3.18 @322pm  stating she called [employer] and they told her the orthotics would be covered if medically necessary and that they need a prior authorization.   I called [employer] and per [facility] @ [employer] the code [phone] is not covered under her plan and that it does not need a authorization.  I notified pt of this and she said thank you.

## 2020-03-16 ENCOUNTER — Telehealth: Payer: Self-pay | Admitting: *Deleted

## 2020-03-16 ENCOUNTER — Encounter: Payer: Self-pay | Admitting: Internal Medicine

## 2020-03-16 ENCOUNTER — Other Ambulatory Visit: Payer: Self-pay

## 2020-03-16 ENCOUNTER — Ambulatory Visit (INDEPENDENT_AMBULATORY_CARE_PROVIDER_SITE_OTHER): Payer: Medicare HMO | Admitting: Internal Medicine

## 2020-03-16 VITALS — BP 128/77 | HR 96 | Temp 98.3°F | Ht 65.0 in | Wt 208.1 lb

## 2020-03-16 DIAGNOSIS — X58XXXA Exposure to other specified factors, initial encounter: Secondary | ICD-10-CM | POA: Diagnosis not present

## 2020-03-16 DIAGNOSIS — S20121A Blister (nonthermal) of breast, right breast, initial encounter: Secondary | ICD-10-CM

## 2020-03-16 NOTE — Patient Instructions (Signed)
FOLLOW-UP INSTRUCTIONS When: If it does not improve or it worsens What to bring: All of your medications  I have not made any changes to your medications today.   Today we discussed the blister on your right breast. This looks like a friction blister. Your recent mammogram looked negative. I recommend that you use a sports bra or a different bra that does not have under-wire or hard plastic in it that would make this spot wore. You may apply a small bandage to the area to prevent the blister from bursting. If it does burst, please apply a Band-Aid or similar bandage to the area to prevent infection.   Thank you for your visit to the [facility] today. If you have any questions or concerns please call us at [phone].

## 2020-03-16 NOTE — Telephone Encounter (Signed)
Pt calls and states she has a fluid filled blister on her R breast, this is second one recently and she is worried. [place] 3/25 at 1545

## 2020-03-16 NOTE — Progress Notes (Signed)
   CC: "cyst of the right breast"  HPI:Jasmine Jackson is a 49 y.o. female who presents for evaluation of cyst. Please see individual problem based A/P for details.  Superficial blister of the right breast: Patient presents today for evaluation of a superficial blister of the right breast of <8 hours duration. This is the second such blister that has developed in the area since the prior day. The blister is located ~4 inches from the right nipple at the 9 o'clock position.  It is about 5/8 of an inch in diameter consistent with an epidermal layer clear fluid below.  There is no associated erythema, edema, mass, lump or other abnormality of the breast.  There is no burning, tingling other painful sensation.  The area is the approximate location of the tip of the underwire in her brawl. This appears to be a friction blister. She is up-to-date on her routine breast cancer screening with the most recent completed in November 2020 that was BI-RADS Category 1.  Plan: I advised the patient to wear sports bra, cover the area with a loosefitting Band-Aid and to purchase a more supportive bra does not have wire hard plastic in it. She was given return precautions and advised to follow-up if not resolved or worsened  Depression, PHQ-9: Based on the patients    Office Visit from 03/16/2020 in [place]  PHQ-9 Total Score  5     score we have decided to monitor.  Past Medical History:  Diagnosis Date  . Anemia   . Anxiety   . Anxiety and depression   . Asthma   . Bipolar 1 disorder ([place])   . Depression   . Excessive daytime sleepiness 11/18/2017  . GERD (gastroesophageal reflux disease)   . Heart murmur   . Hemorrhoids   . Hiatal hernia   . Hypertension   . Internal hemorrhoids   . Toe injury 11/18/2017   Review of Systems:  ROS negative except as per HPI.  Physical Exam: Vitals:   03/16/20 1546  BP: 128/77  Pulse: 96  Temp: 98.3 F (36.8 C)  TempSrc:  Oral  SpO2: 100%  Weight: 208 lb 1.6 oz (94.4 kg)  Height: 5\' 5"  (1.651 m)   Filed Weights   03/16/20 1546  Weight: 208 lb 1.6 oz (94.4 kg)   General: A/O x4, in no acute distress, afebrile, nondiaphoretic HEENT: PEERL, EMO intact Skin: As per A/P Psych: Appropriate affect, not depressed in appearance, engages well  Assessment & Plan:   See Encounters Tab for problem based charting.  Patient discussed with Dr. Philipp Ovens

## 2020-03-17 ENCOUNTER — Other Ambulatory Visit: Payer: Self-pay | Admitting: Internal Medicine

## 2020-03-17 ENCOUNTER — Encounter: Payer: Self-pay | Admitting: Internal Medicine

## 2020-03-17 NOTE — Progress Notes (Signed)
Internal Medicine Clinic Attending  Case discussed with Dr. Harbrecht at the time of the visit.  We reviewed the resident's history and exam and pertinent patient test results.  I agree with the assessment, diagnosis, and plan of care documented in the resident's note.   

## 2020-03-30 ENCOUNTER — Ambulatory Visit: Payer: Medicare HMO | Attending: Internal Medicine

## 2020-03-30 DIAGNOSIS — Z23 Encounter for immunization: Secondary | ICD-10-CM

## 2020-03-30 NOTE — Progress Notes (Signed)
   Covid-19 Vaccination Clinic  Name:  [name]    MRN: [id number] DOB: Apr 08, 1971  03/30/2020  [name] was observed post Covid-19 immunization for 15 minutes without incident. She was provided with Vaccine Information Sheet and instruction to access the V-Safe system.   [name] was instructed to call 911 with any severe reactions post vaccine: Marland Kitchen Difficulty breathing  . Swelling of face and throat  . A fast heartbeat  . A bad rash all over body  . Dizziness and weakness   Immunizations Administered    Name Date Dose VIS Date Route   Pfizer COVID-19 Vaccine 03/30/2020 10:04 AM 0.3 mL 12/03/2019 Intramuscular   Manufacturer: [place]   Lot: [id number]   [place]: [id number]

## 2020-04-06 ENCOUNTER — Other Ambulatory Visit: Payer: Self-pay | Admitting: *Deleted

## 2020-04-06 MED ORDER — ALBUTEROL SULFATE HFA 108 (90 BASE) MCG/ACT IN AERS: 2.0000 | INHALATION_SPRAY | Freq: Four times a day (QID) | RESPIRATORY_TRACT | 1 refills | Status: DC | PRN

## 2020-04-10 ENCOUNTER — Encounter: Payer: Self-pay | Admitting: Podiatry

## 2020-04-10 ENCOUNTER — Other Ambulatory Visit: Payer: Self-pay

## 2020-04-10 ENCOUNTER — Other Ambulatory Visit: Payer: Self-pay | Admitting: Internal Medicine

## 2020-04-10 ENCOUNTER — Ambulatory Visit (INDEPENDENT_AMBULATORY_CARE_PROVIDER_SITE_OTHER): Payer: Medicare HMO | Admitting: Podiatry

## 2020-04-10 DIAGNOSIS — M2141 Flat foot [pes planus] (acquired), right foot: Secondary | ICD-10-CM

## 2020-04-10 DIAGNOSIS — M2142 Flat foot [pes planus] (acquired), left foot: Secondary | ICD-10-CM | POA: Diagnosis not present

## 2020-04-10 DIAGNOSIS — M779 Enthesopathy, unspecified: Secondary | ICD-10-CM | POA: Diagnosis not present

## 2020-04-10 NOTE — Patient Instructions (Signed)
For instructions on how to put on your Tri-Lock Ankle Brace, please visit [email or web address] 

## 2020-04-21 ENCOUNTER — Other Ambulatory Visit: Payer: Self-pay | Admitting: Gastroenterology

## 2020-04-24 NOTE — Progress Notes (Signed)
Subjective: 49 year old female presents the office today for follow-up evaluation of right foot capsulitis.  She states that overall she is doing much better and the Tri-Lock ankle brace has been helpful.  She has difficulty putting on and she does not remember how to do so correctly.  Overall she is doing much better no significant pain. Denies any systemic complaints such as fevers, chills, nausea, vomiting. No acute changes since last appointment, and no other complaints at this time.   Objective: AAO x3, NAD DP/PT pulses palpable bilaterally, CRT less than 3 seconds At this time there is no significant pain identified bilaterally particular there is no pain on the right foot on sinus tarsi.  There is no pain the course the peroneal tendons with Achilles tendon.  Flexor, extensor tendons appear to be intact.  No open lesions or pre-ulcerative lesions.  Flatfoot is present.  No pain with calf compression, swelling, warmth, erythema  Assessment: 49 year old female with resolving capsulitis, tendinitis  Plan: -All treatment options discussed with the patient including all alternatives, risks, complications.  -Doing much better.  Continue with ankle brace as needed.  We showed her how to put this on today.  We will check orthotic coverage.  Continue with supportive shoes. -Patient encouraged to call the office with any questions, concerns, change in symptoms.   Return if symptoms worsen or fail to improve.  Trula Slade DPM

## 2020-04-26 ENCOUNTER — Ambulatory Visit: Payer: Medicare HMO | Attending: Internal Medicine

## 2020-04-26 DIAGNOSIS — Z23 Encounter for immunization: Secondary | ICD-10-CM

## 2020-04-26 NOTE — Progress Notes (Signed)
   Covid-19 Vaccination Clinic  Name:  Jasmine Jackson    MRN: [id number] DOB: 1971/08/20  04/26/2020  Jasmine Jackson was observed post Covid-19 immunization for 15 minutes without incident. She was provided with Vaccine Information Sheet and instruction to access the V-Safe system.   Jasmine Jackson was instructed to call 911 with any severe reactions post vaccine: Marland Kitchen Difficulty breathing  . Swelling of face and throat  . A fast heartbeat  . A bad rash all over body  . Dizziness and weakness   Immunizations Administered    Name Date Dose VIS Date Route   Pfizer COVID-19 Vaccine 04/26/2020 10:10 AM 0.3 mL 02/16/2019 Intramuscular   Manufacturer: [place]   Lot: [id number]   [place]: [id number]

## 2020-05-11 ENCOUNTER — Telehealth: Payer: Self-pay | Admitting: Gastroenterology

## 2020-05-11 NOTE — Telephone Encounter (Signed)
Patient is calling- she is requesting a refill for Linzess and is asking if she can take 2 a day instead of 1. states she can use the rest room when she takes 2. I let her know she needs a yearly appointment for refills. She is asking to schedule her hemorrhoid banding that she did not do last year. She wants to know if she can do them both in one appointment. (banding and yearly follow up)

## 2020-05-11 NOTE — Telephone Encounter (Signed)
Called patient with no answer and no voicemail to leave a message. 

## 2020-05-12 NOTE — Telephone Encounter (Signed)
Left message for patient to return my call.

## 2020-05-15 NOTE — Telephone Encounter (Signed)
Left message for patient to return my call. Will await phone call back from patient. 

## 2020-05-20 ENCOUNTER — Encounter (HOSPITAL_COMMUNITY): Payer: Self-pay | Admitting: Emergency Medicine

## 2020-05-20 ENCOUNTER — Ambulatory Visit (HOSPITAL_COMMUNITY)
Admission: EM | Admit: 2020-05-20 | Discharge: 2020-05-20 | Disposition: A | Discharge status: Home / Self Care | Payer: Medicare HMO | Attending: Internal Medicine | Admitting: Internal Medicine

## 2020-05-20 ENCOUNTER — Other Ambulatory Visit: Payer: Self-pay

## 2020-05-20 DIAGNOSIS — L139 Bullous disorder, unspecified: Secondary | ICD-10-CM

## 2020-05-20 MED ORDER — CLOTRIMAZOLE-BETAMETHASONE 1-0.05 % EX CREA
TOPICAL_CREAM | CUTANEOUS | 0 refills | Status: DC
Start: 2020-05-20 — End: 2020-07-17

## 2020-05-20 NOTE — ED Triage Notes (Signed)
Pt here with blister to abd area that appeared 3 hours ago; pt sts hx of similar 1 month ago; pt had no idea what is causing; small fluid filled blister noted

## 2020-05-21 NOTE — ED Provider Notes (Signed)
[place]    CSN: [id number] Arrival date & time: 05/20/20  1600      History   Chief Complaint Chief Complaint  Patient presents with  . Blister    HPI Jasmine SINGELTON is a 49 y.o. female comes to the urgent care with complaint of a blister on the anterior abdominal wall 3 hours ago.  No trauma, no itching, no redness or fever.  This is the second episode the patient has had in a month.  The first episode was on the right breast and that resolved spontaneously with some scarring.  No fever or chills.   HPI  Past Medical History:  Diagnosis Date  . Anemia   . Anxiety   . Anxiety and depression   . Asthma   . Bipolar 1 disorder ([place])   . Depression   . Excessive daytime sleepiness 11/18/2017  . GERD (gastroesophageal reflux disease)   . Heart murmur   . Hemorrhoids   . Hiatal hernia   . Hypertension   . Internal hemorrhoids   . Toe injury 11/18/2017    Patient Active Problem List   Diagnosis Date Noted  . Acanthosis nigricans 11/16/2019  . Nocturnal leg cramps 10/18/2018  . Heel pain, bilateral 09/21/2018  . Bloody diarrhea 05/22/2018  . Knee pain 03/05/2018  . Migraine 11/18/2017  . Snoring 11/18/2017  . Colitis 03/06/2017  . Vaginal discharge 12/25/2016  . Hot flashes 11/27/2016  . Tobacco use  10/18/2016  . Panic attacks 09/09/2016  . Health care maintenance 01/11/2016  . Low back pain 12/02/2015  . Hypertension 04/24/2015  . Muscle spasm 04/24/2015  . Bipolar disorder ([place]) 04/16/2014  . History of gout 04/01/2014  . Internal hemorrhoids without complication [id number]  . Irritable bowel syndrome 12/08/2013  . Esophageal reflux 12/08/2013    Past Surgical History:  Procedure Laterality Date  . ABDOMINAL HYSTERECTOMY     fibroids  . HEMORRHOID SURGERY    . INGUINAL HERNIA REPAIR    . TONSILLECTOMY    . TUBAL LIGATION      OB History   No obstetric history on file.      Home Medications    Prior to Admission medications     Medication Sig Start Date End Date Taking? Authorizing Provider  albuterol (VENTOLIN HFA) 108 (90 Base) MCG/ACT inhaler Inhale 2 puffs into the lungs every 6 (six) hours as needed for wheezing or shortness of breath. 04/06/20   Marianna Payment, MD  amLODipine (NORVASC) 5 MG tablet TAKE 1 TABLET(5 MG) BY MOUTH DAILY 02/04/20   Marianna Payment, MD  busPIRone (BUSPAR) 15 MG tablet Take 30 mg by mouth 2 (two) times daily.    [provider]  clotrimazole-betamethasone (LOTRISONE) cream Apply to affected area 2 times daily prn 05/20/20   Reeanna Acri, Myrene Galas, MD  dicyclomine (BENTYL) 20 MG tablet Take 0.5 tablets (10 mg total) by mouth 4 (four) times daily -  before meals and at bedtime. 02/04/20   Marianna Payment, MD  FLUoxetine (PROZAC) 40 MG capsule Take 2 capsules (80 mg total) by mouth every morning. 01/01/18   Jule Ser, DO  gabapentin (NEURONTIN) 300 MG capsule  02/09/20   [provider]  gabapentin (NEURONTIN) 800 MG tablet Take 1 tablet (800 mg total) by mouth 3 (three) times daily. 02/04/20 03/05/20  Marianna Payment, MD  hydrochlorothiazide (HYDRODIURIL) 25 MG tablet TAKE 1 TABLET(25 MG) BY MOUTH DAILY 04/10/20   Marianna Payment, MD  hydrOXYzine (ATARAX/VISTARIL) 50 MG tablet Take  50 mg by mouth 3 (three) times daily. 02/13/17   [provider]  hydrOXYzine (VISTARIL) 50 MG capsule  02/09/20   [provider]  lamoTRIgine (LAMICTAL) 100 MG tablet  02/22/20   [provider]  linaclotide (LINZESS) 290 MCG CAPS capsule Take 1 capsule (290 mcg total) by mouth daily before breakfast. 02/29/20   Ladene Artist, MD  meloxicam (MOBIC) 15 MG tablet Take 1 tablet (15 mg total) by mouth daily. 03/09/20 03/09/21  Trula Slade, DPM  metoCLOPramide (REGLAN) 10 MG tablet Take 1 tablet (10 mg total) by mouth every 12 (twelve) hours as needed for nausea. 03/01/20 03/01/21  Marianna Payment, MD  metroNIDAZOLE (FLAGYL) 500 MG tablet Take 1 tablet (500 mg total) by mouth 3 (three) times  daily. One po tid x 7 days 11/12/19   Veryl Speak, MD  mirtazapine (REMERON SOL-TAB) 15 MG disintegrating tablet  02/01/20   [provider]  omeprazole (PRILOSEC) 40 MG capsule TAKE 1 CAPSULE(40 MG) BY MOUTH TWICE DAILY 02/29/20   Marianna Payment, MD  ondansetron (ZOFRAN) 4 MG tablet TAKE 1 TABLET(4 MG) BY MOUTH EVERY 8 HOURS AS NEEDED FOR NAUSEA OR VOMITING 07/08/19   Welford Roche, MD  ondansetron (ZOFRAN) 8 MG tablet  11/12/19   [provider]  ondansetron (ZOFRAN-ODT) 8 MG disintegrating tablet DISSOLVE 1 TABLET ON THE TONGUE EVERY 4 HOURS AS NEEDED FOR NAUSEA 03/22/20   Marianna Payment, MD  polyethylene glycol (MIRALAX / GLYCOLAX) packet Take 17 g by mouth daily. 04/26/17   Rice, Resa Miner, MD  promethazine (PHENERGAN) 25 MG suppository Place 1 suppository (25 mg total) rectally every 6 (six) hours as needed for nausea or vomiting. 10/16/19   Tedd Sias, PA  QUEtiapine (SEROQUEL XR) 400 MG 24 hr tablet Take 3 tablets (1,200 mg total) by mouth at bedtime. 01/01/18   Jule Ser, DO  QUEtiapine (SEROQUEL) 400 MG tablet Take 200 mg by mouth daily. At 8 pm 10/10/17   [provider]  ranitidine (ZANTAC) 300 MG tablet Take 1 tablet (300 mg total) by mouth daily. 10/21/18   Isabelle Course, MD  tiZANidine (ZANAFLEX) 4 MG tablet Take 0.5 tablets (2 mg total) by mouth 3 (three) times daily for 4 days, THEN 1 tablet (4 mg total) 3 (three) times daily. 02/04/20 02/07/21  Marianna Payment, MD    Family History Family History  Problem Relation Age of Onset  . Breast cancer Maternal Aunt        x4  . Stomach cancer Maternal Aunt   . Prostate cancer Maternal Uncle        x2  . Breast cancer Maternal Grandmother   . Celiac disease Mother   . Breast cancer Mother 77  . Breast cancer Paternal Grandmother   . Colon cancer Neg Hx   . Esophageal cancer Neg Hx   . Rectal cancer Neg Hx     Social History Social History   Tobacco Use  . Smoking status: Current  Every Day Smoker    Packs/day: 0.75    Types: Cigarettes  . Smokeless tobacco: Never Used  . Tobacco comment: 10-12 CIG A DAY  Substance Use Topics  . Alcohol use: No    Alcohol/week: 0.0 standard drinks  . Drug use: No     Allergies   Asa [aspirin] and Banana   Review of Systems Review of Systems  Musculoskeletal: Negative.   Skin: Positive for rash. Negative for color change and wound.  Neurological: Negative.  Physical Exam Triage Vital Signs ED Triage Vitals [05/20/20 1609]  Enc Vitals Group     BP 131/85     Pulse Rate 89     Resp 18     Temp 98.4 F (36.9 C)     Temp Source Oral     SpO2 98 %     Weight      Height      Head Circumference      Peak Flow      Pain Score 2     Pain Loc      Pain Edu?      Excl. in [place]?    No data found.  Updated Vital Signs BP 131/85 (BP Location: Right Arm)   Pulse 89   Temp 98.4 F (36.9 C) (Oral)   Resp 18   SpO2 98%   Visual Acuity Right Eye Distance:   Left Eye Distance:   Bilateral Distance:    Right Eye Near:   Left Eye Near:    Bilateral Near:     Physical Exam Vitals and nursing note reviewed.  Constitutional:      General: She is not in acute distress.    Appearance: Normal appearance. She is not ill-appearing.  Cardiovascular:     Rate and Rhythm: Normal rate and regular rhythm.  Skin:    Comments: Solitary bullous lesion on the anterior abdominal wall measuring about 1 inch in the longest diameter.  Minimal surrounding erythema.  Neurological:     Mental Status: She is alert.      UC Treatments / Results  Labs (all labs ordered are listed, but only abnormal results are displayed) Labs Reviewed - No data to display  EKG   Radiology No results found.  Procedures Procedures (including critical care time)  Medications Ordered in UC Medications - No data to display  Initial Impression / Assessment and Plan / UC Course  I have reviewed the triage vital signs and the nursing  notes.  Pertinent labs & imaging results that were available during my care of the patient were reviewed by me and considered in my medical decision making (see chart for details).     1.  Bullous disorder: Supportive care Patient is encouraged not to do the blister Lotrisone cream If symptom recurs patient is advised to go to the dermatologist for further evaluation.  Information for dermatologist has been given to the patient. Final Clinical Impressions(s) / UC Diagnoses   Final diagnoses:  Bullous disorder, unspecified   Discharge Instructions   None    ED Prescriptions    Medication Sig Dispense Auth. Provider   clotrimazole-betamethasone (LOTRISONE) cream Apply to affected area 2 times daily prn 15 g Kamla Skilton, Myrene Galas, MD     PDMP not reviewed this encounter.   Chase Picket, MD 05/21/20 1024

## 2020-05-25 ENCOUNTER — Other Ambulatory Visit: Payer: Self-pay | Admitting: *Deleted

## 2020-05-25 MED ORDER — MELOXICAM 15 MG PO TABS: 15.0000 mg | ORAL_TABLET | Freq: Every day | ORAL | 0 refills | Status: DC

## 2020-05-25 NOTE — Telephone Encounter (Signed)
Refilled mobic 15 mg number 30. Jasmine Jackson

## 2020-05-29 ENCOUNTER — Ambulatory Visit: Payer: Medicare HMO | Admitting: Podiatry

## 2020-06-02 ENCOUNTER — Other Ambulatory Visit: Payer: Self-pay

## 2020-06-02 ENCOUNTER — Ambulatory Visit (INDEPENDENT_AMBULATORY_CARE_PROVIDER_SITE_OTHER): Payer: Medicare HMO | Admitting: Podiatry

## 2020-06-02 ENCOUNTER — Encounter: Payer: Self-pay | Admitting: Podiatry

## 2020-06-02 DIAGNOSIS — M779 Enthesopathy, unspecified: Secondary | ICD-10-CM

## 2020-06-02 MED ORDER — MELOXICAM 15 MG PO TABS: 15.0000 mg | ORAL_TABLET | Freq: Every day | ORAL | 3 refills | Status: AC

## 2020-06-04 NOTE — Progress Notes (Signed)
Subjective:   Patient ID: Jasmine Jackson, female   DOB: 49 y.o.   MRN: [id number]   HPI Patient states that she has developed pain in the outside of the right ankle and it feels like it is deep in the ankle and is very tender when pressed she wants an injection   ROS      Objective:  Physical Exam  Neurovascular status intact with exquisite discomfort in the sinus tarsi right with inflammation fluid buildup     Assessment:  Sinus tarsitis capsulitis right     Plan:  Sterile prep injected the sinus tarsi right 3 mg Kenalog 5 mg Xylocaine and advised this patient on anti-inflammatories physical therapy support shoes and reappoint as needed

## 2020-06-05 ENCOUNTER — Ambulatory Visit: Payer: Medicare HMO

## 2020-06-06 ENCOUNTER — Ambulatory Visit (INDEPENDENT_AMBULATORY_CARE_PROVIDER_SITE_OTHER): Payer: Medicare HMO | Admitting: Internal Medicine

## 2020-06-06 ENCOUNTER — Encounter: Payer: Self-pay | Admitting: Internal Medicine

## 2020-06-06 ENCOUNTER — Other Ambulatory Visit: Payer: Self-pay

## 2020-06-06 DIAGNOSIS — M25561 Pain in right knee: Secondary | ICD-10-CM

## 2020-06-06 DIAGNOSIS — M25562 Pain in left knee: Secondary | ICD-10-CM

## 2020-06-06 DIAGNOSIS — G8929 Other chronic pain: Secondary | ICD-10-CM

## 2020-06-06 NOTE — Patient Instructions (Addendum)
[name],  It was a pleasure to see you today. Thank you for coming in.   Today we discussed your knee pain. In regards to this you have received 2 knee injections today. Please monitor for any bleeding, fevers, chills, or worsening pain.  Thank you again for coming in.   Asencion Noble.D.

## 2020-06-06 NOTE — Assessment & Plan Note (Signed)
bilateral knee osteoarthritis requiring intermittent steroid injections presenting with worsening pain in her bilateral knees. It's currently a 6/10 in severity, feels like her right knee is worse. Last knee injection was on 02/08/20, reports that it helped a lot when she received them. Denies any fevers, chills, worsening nausea, vomiting, shortness of breath, or chest pain.    Right knee: After consent was obtained, steroid injection was performed at the anterior lateral approach to the right knee using 1% plain Lidocaine and 40 mg (1 mL) of Kenalog. This was well tolerated. No bleed or other issues noted. Advised to monitor for fevers, worsening pain, or bleeding.   Left knee: After consent was obtained, steroid injection was performed at the anterior lateral approach to the left knee using 1% plain Lidocaine and 40 mg (1 mL) of Kenalog. This was well tolerated. No bleed or other issues noted. Advised to monitor for fevers, worsening pain, or bleeding.   -Patient received bilateral knee injections with 1 mL of kenalog and 2 mL of 1% lidocaine.  -Advised to follow up as needed

## 2020-06-06 NOTE — Progress Notes (Signed)
   CC: Requesting bilateral knee steroid injections  HPI:  [name] is a 49 y.o. with the history listed including bilateral knee osteoarthritis requiring intermittent steroid injections presenting with worsening pain in her bilateral knees. It's currently a 6/10 in severity, feels like her right knee is worse. Last knee injection was on 02/08/20, reports that it helped a lot when she received them. Denies any fevers, chills, worsening nausea, vomiting, shortness of breath, or chest pain.    Past Medical History:  Diagnosis Date  . Anemia   . Anxiety   . Anxiety and depression   . Asthma   . Bipolar 1 disorder ([place])   . Depression   . Excessive daytime sleepiness 11/18/2017  . GERD (gastroesophageal reflux disease)   . Heart murmur   . Hemorrhoids   . Hiatal hernia   . Hypertension   . Internal hemorrhoids   . Toe injury 11/18/2017   Review of Systems:  Constitutional: Negative for chills and fever.  Respiratory: Negative for shortness of breath.   Cardiovascular: Negative for chest pain and leg swelling.  Gastrointestinal: Negative for abdominal pain, nausea and vomiting.  MSK: Reports bilateral knee pain.  Neurological: Negative for dizziness and headaches.    Physical Exam:  Vitals:   06/06/20 1552  BP: 127/71  Pulse: 91  Temp: 98.3 F (36.8 C)  TempSrc: Oral  SpO2: 100%  Weight: 204 lb 1.6 oz (92.6 kg)  Height: 5\' 6"  (1.676 m)   Physical Exam Constitutional:      Appearance: Normal appearance.  HENT:     Head: Normocephalic and atraumatic.     Nose: Nose normal.     Mouth/Throat:     Mouth: Mucous membranes are moist.     Pharynx: Oropharynx is clear.  Eyes:     Extraocular Movements: Extraocular movements intact.     Pupils: Pupils are equal, round, and reactive to light.  Cardiovascular:     Rate and Rhythm: Normal rate and regular rhythm.     Pulses: Normal pulses.  Pulmonary:     Effort: Pulmonary effort is normal.     Breath sounds:  Normal breath sounds.  Abdominal:     General: Abdomen is flat. Bowel sounds are normal.     Palpations: Abdomen is soft.  Musculoskeletal:        General: Tenderness (Mild TTP over bilateral knees, no joint effusion noted) present. No swelling. Normal range of motion.     Cervical back: Normal range of motion and neck supple.  Skin:    General: Skin is warm and dry.     Capillary Refill: Capillary refill takes less than 2 seconds.  Neurological:     General: No focal deficit present.     Mental Status: She is alert and oriented to person, place, and time.  Psychiatric:        Mood and Affect: Mood normal.        Behavior: Behavior normal.    Assessment & Plan:   See Encounters Tab for problem based charting.  Patient seen with Dr. Heber [place]

## 2020-06-07 NOTE — Progress Notes (Signed)
Internal Medicine Clinic Attending  I saw and evaluated the patient.  I personally confirmed the key portions of the history and exam documented by Dr. Sherry Ruffing and I reviewed pertinent patient test results.  The assessment, diagnosis, and plan were formulated together and I agree with the documentation in the resident's note.   I was present for both knee injections, there were no complications.

## 2020-06-14 ENCOUNTER — Other Ambulatory Visit: Payer: Self-pay | Admitting: Internal Medicine

## 2020-06-14 DIAGNOSIS — M62838 Other muscle spasm: Secondary | ICD-10-CM

## 2020-06-16 NOTE — Telephone Encounter (Signed)
I  am refilling patients muscle relaxer, tizanidine today, but she will need to be re-evaluated at her next office appointment for the efficacy and need for this medication.  Marianna Payment, D.O. [facility], PGY-1 Pager: [phone], Phone: [phone] Date 06/16/2020 Time 12:40 PM

## 2020-06-21 ENCOUNTER — Other Ambulatory Visit: Payer: Self-pay | Admitting: Internal Medicine

## 2020-06-21 DIAGNOSIS — K582 Mixed irritable bowel syndrome: Secondary | ICD-10-CM

## 2020-06-21 MED ORDER — METOCLOPRAMIDE HCL 10 MG PO TABS: 10.0000 mg | ORAL_TABLET | Freq: Two times a day (BID) | ORAL | 0 refills | Status: DC | PRN

## 2020-06-21 MED ORDER — ONDANSETRON 8 MG PO TBDP: ORAL_TABLET | ORAL | 0 refills | Status: DC

## 2020-06-21 NOTE — Telephone Encounter (Signed)
Dr. Marianna Payment,  TC to patient, she is requesting a [id number] on both Reglan and Zofran ODT. Thanks, SChaplin, RN,BSN

## 2020-06-21 NOTE — Telephone Encounter (Signed)
Requesting to speak with a nurse, please call pt back.  

## 2020-06-21 NOTE — Telephone Encounter (Signed)
Need refill on nausea  ;pt contact  [phone]   Needs a 90-day supply  Paul, [place] E[place]

## 2020-06-23 ENCOUNTER — Telehealth: Payer: Self-pay | Admitting: Podiatry

## 2020-06-23 NOTE — Telephone Encounter (Signed)
Please disregard previous message. Pts pharmacy called stating they did actually receive the prescription and she is going to pick it up today.

## 2020-06-23 NOTE — Telephone Encounter (Signed)
Pt called stating that her pharmacy is still saying that she has not received her meloxicam from 06/02/20 state dthat pharmacy says they never received the script

## 2020-06-27 ENCOUNTER — Other Ambulatory Visit: Payer: Self-pay | Admitting: Internal Medicine

## 2020-06-27 DIAGNOSIS — M545 Low back pain, unspecified: Secondary | ICD-10-CM

## 2020-06-28 ENCOUNTER — Other Ambulatory Visit: Payer: Self-pay | Admitting: Gastroenterology

## 2020-07-03 ENCOUNTER — Other Ambulatory Visit: Payer: Self-pay | Admitting: Internal Medicine

## 2020-07-17 ENCOUNTER — Encounter: Payer: Self-pay | Admitting: Gastroenterology

## 2020-07-17 ENCOUNTER — Ambulatory Visit (INDEPENDENT_AMBULATORY_CARE_PROVIDER_SITE_OTHER): Payer: Medicare HMO | Admitting: Gastroenterology

## 2020-07-17 VITALS — BP 140/90 | HR 84 | Ht 66.0 in | Wt 196.0 lb

## 2020-07-17 DIAGNOSIS — K921 Melena: Secondary | ICD-10-CM | POA: Diagnosis not present

## 2020-07-17 DIAGNOSIS — K642 Third degree hemorrhoids: Secondary | ICD-10-CM

## 2020-07-17 DIAGNOSIS — K581 Irritable bowel syndrome with constipation: Secondary | ICD-10-CM

## 2020-07-17 DIAGNOSIS — K219 Gastro-esophageal reflux disease without esophagitis: Secondary | ICD-10-CM | POA: Diagnosis not present

## 2020-07-17 MED ORDER — SUTAB 1479-225-188 MG PO TABS
1.0000 | ORAL_TABLET | ORAL | 0 refills | Status: DC
Start: 2020-07-17 — End: 2020-08-11

## 2020-07-17 MED ORDER — HYDROCORTISONE ACETATE 25 MG RE SUPP
25.0000 mg | Freq: Every day | RECTAL | 0 refills | Status: DC
Start: 2020-07-17 — End: 2021-05-29

## 2020-07-17 MED ORDER — FAMOTIDINE 40 MG PO TABS
40.0000 mg | ORAL_TABLET | Freq: Every day | ORAL | 11 refills | Status: DC
Start: 2020-07-17 — End: 2021-03-16

## 2020-07-17 MED ORDER — LINACLOTIDE 290 MCG PO CAPS: 290.0000 ug | ORAL_CAPSULE | Freq: Every day | ORAL | 3 refills | Status: DC

## 2020-07-17 NOTE — Patient Instructions (Signed)
We have sent the following medications to your pharmacy for you to pick up at your convenience: Linzess, anusol suppositories and famotidine.   Start over the counter Miralax mixing 17 grams in 8 oz of water daily.   Patient advised to avoid spicy, acidic, citrus, chocolate, mints, fruit and fruit juices.  Limit the intake of caffeine, alcohol and Soda.  Don't exercise too soon after eating.  Don't lie down within 3-4 hours of eating.  Elevate the head of your bed.  You have been scheduled for a colonoscopy. Please follow written instructions given to you at your visit today.  Please pick up your prep supplies at the pharmacy within the next 1-3 days. If you use inhalers (even only as needed), please bring them with you on the day of your procedure.  Thank you for choosing me and [facility].  Pricilla Riffle. Dagoberto Ligas., MD., Marval Regal

## 2020-07-17 NOTE — Progress Notes (Signed)
° ° °  History of Present Illness: This is a 49 year old female with IBS-C, GERD and internal hemorrhoids complaining of persistent constipation and rectal bleeding.  She relates that she continues to have constipation despite taking Linzess daily and occasionally she will take 2 doses of Linzess 290 mcg each day.  She notes rectal bleeding and hemorrhoid prolapse about 8 out of 10 bowel movements and this is been a persistent pattern for 2 years.  Manual reduction of the hemorrhoids is frequently required.  She had similar symptoms at her last office appointment in March 2020.  Colonoscopy was recommended however with the Covid pandemic the procedure was postponed however she has not rescheduled.  She states her reflux is generally well controlled however she does have frequent nocturnal reflux and occasionally wakes up with significant morning nausea and vomiting.  Current Medications, Allergies, Past Medical History, Past Surgical History, Family History and Social History were reviewed in [employer] record.   Physical Exam: General: Well developed, well nourished, no acute distress Head: Normocephalic and atraumatic Eyes:  sclerae anicteric, EOMI Ears: Normal auditory acuity Mouth: Not examined, mask on during Covid-19 pandemic Lungs: Clear throughout to auscultation Heart: Regular rate and rhythm; no murmurs, rubs or bruits Abdomen: Soft, non tender and non distended. No masses, hepatosplenomegaly or hernias noted. Normal Bowel sounds Rectal: Deferred to colonoscopy Musculoskeletal: Symmetrical with no gross deformities  Pulses:  Normal pulses noted Extremities: No clubbing, cyanosis, edema or deformities noted Neurological: Alert oriented x 4, grossly nonfocal Psychological:  Alert and cooperative. Normal mood and affect   Assessment and Recommendations:  1.  IBS-C.  Continue Linzess 290 mcg daily and dicyclomine 10 mg p.o. 4 times daily as needed.  Begin MiraLAX  once daily in combination with daily Linzess.  If constipation is not adequately treated she is advised to call for further instructions.  2.  GERD.  Continue omeprazole 40 mg po qd. Follow antireflux measures.  Begin famotidine 40 mg at bedtime.  3. Hematochezia with bleeding and prolapse.  Grade 3 internal hemorrhoids.  R/O neoplasm, IBD, other causes of bleeding.  Begin Anusol HC suppositories at bedtime.  Follow standard rectal care instructions.  Schedule colonoscopy. The risks (including bleeding, perforation, infection, missed lesions, medication reactions and possible hospitalization or surgery if complications occur), benefits, and alternatives to colonoscopy with possible biopsy and possible polypectomy were discussed with the patient and they consent to proceed.  We discussed proceeding with hemorrhoidal banding following colonoscopy.

## 2020-07-19 ENCOUNTER — Telehealth: Payer: Self-pay | Admitting: Gastroenterology

## 2020-07-19 NOTE — Telephone Encounter (Signed)
Patient called to include that they do cover  hydrocortizone rectal cream. The pharmacy does not have [place].

## 2020-07-19 NOTE — Telephone Encounter (Signed)
Informed patient I initiated a PA for the anusol suppositories but if it is not covered then I will contact her to change the medication. Patient verbalized understanding.

## 2020-07-20 NOTE — Telephone Encounter (Signed)
Informed patient to pick up Prep H suppositories and coat in 1% hydrocortisone cream and insert daily. Patient verbalized understanding.

## 2020-07-20 NOTE — Telephone Encounter (Signed)
Prep H supp coated with 1% hydrocortisone qd

## 2020-07-20 NOTE — Telephone Encounter (Signed)
Received PA from [facility] that suppositories were denied. Please advise Dr. Fuller Plan.

## 2020-07-21 IMAGING — DX DG KNEE COMPLETE 4+V*L*
4 series · 4 of 4 positions shown · non-contrast
Comparison: None.

CLINICAL DATA: Left knee pain for several weeks, no known injury,
initial encounter

EXAM:
LEFT KNEE - COMPLETE 4+ VIEW

[t knee ap left]
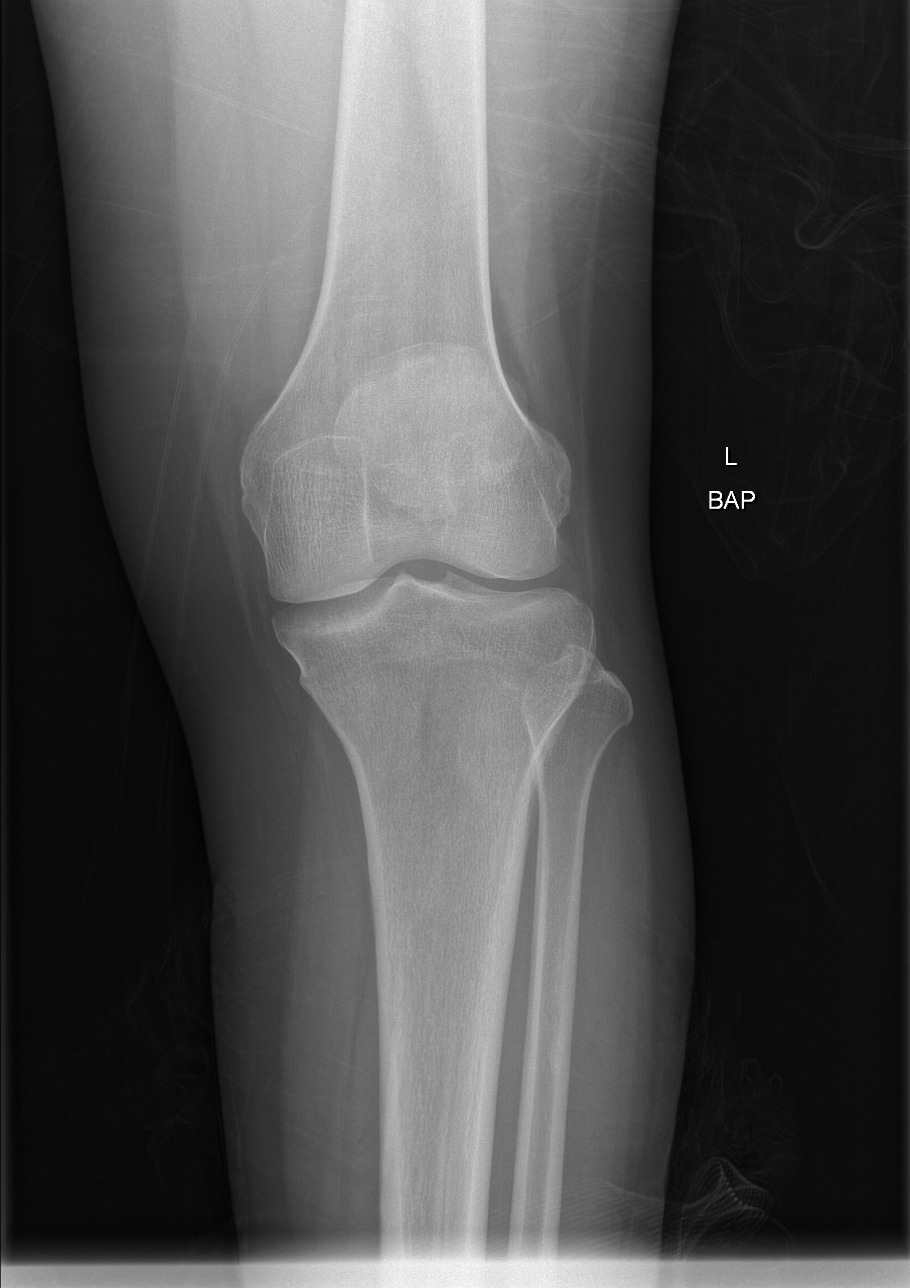

[t knee obl left (1 of 2)]
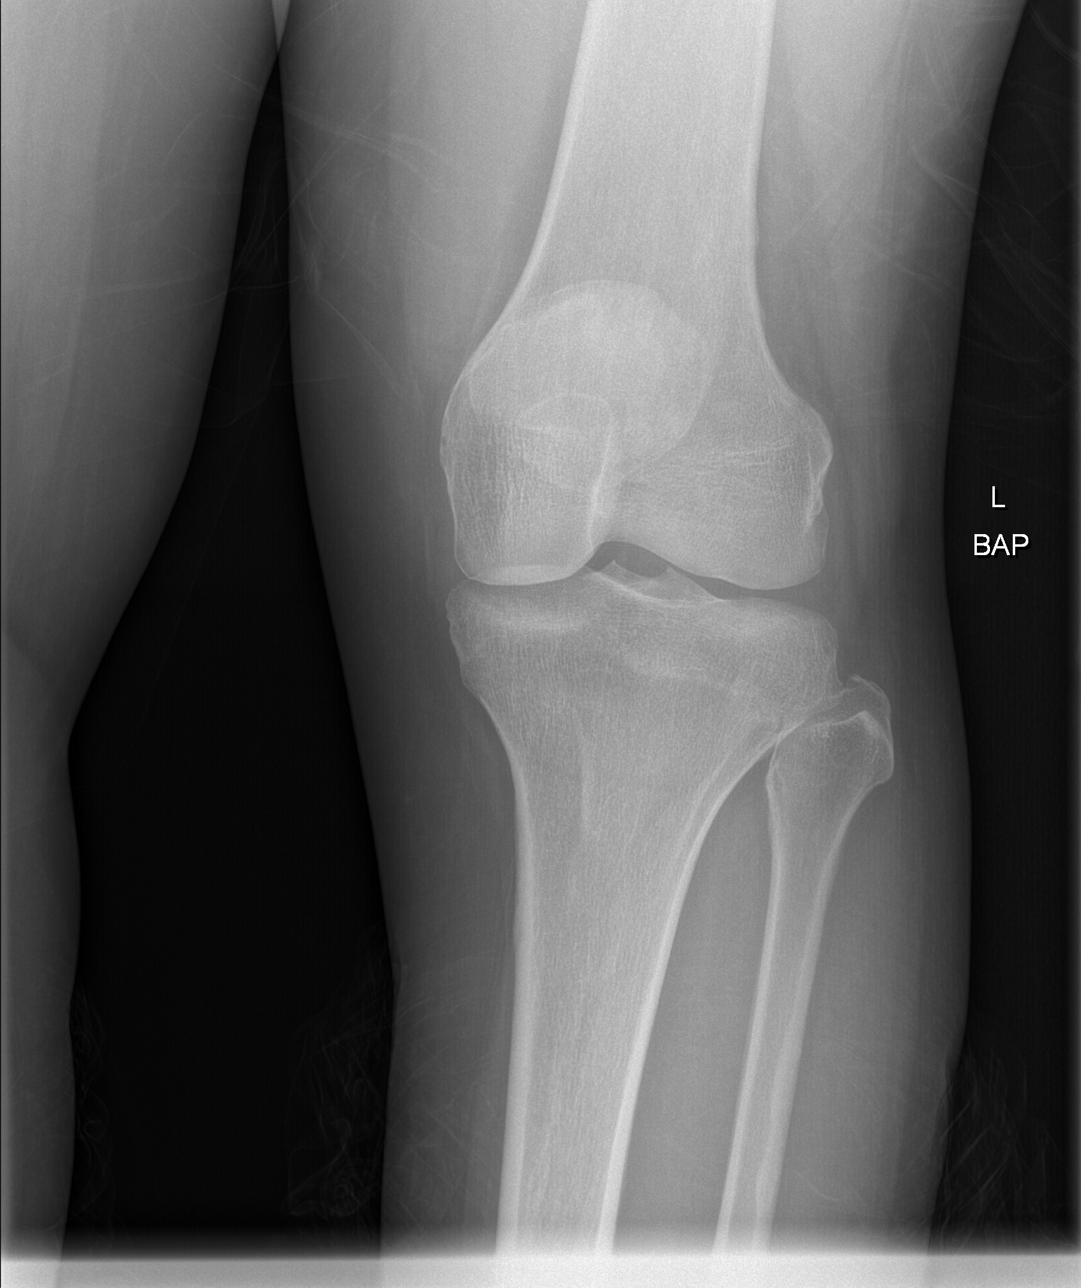

[t knee obl left (2 of 2)]
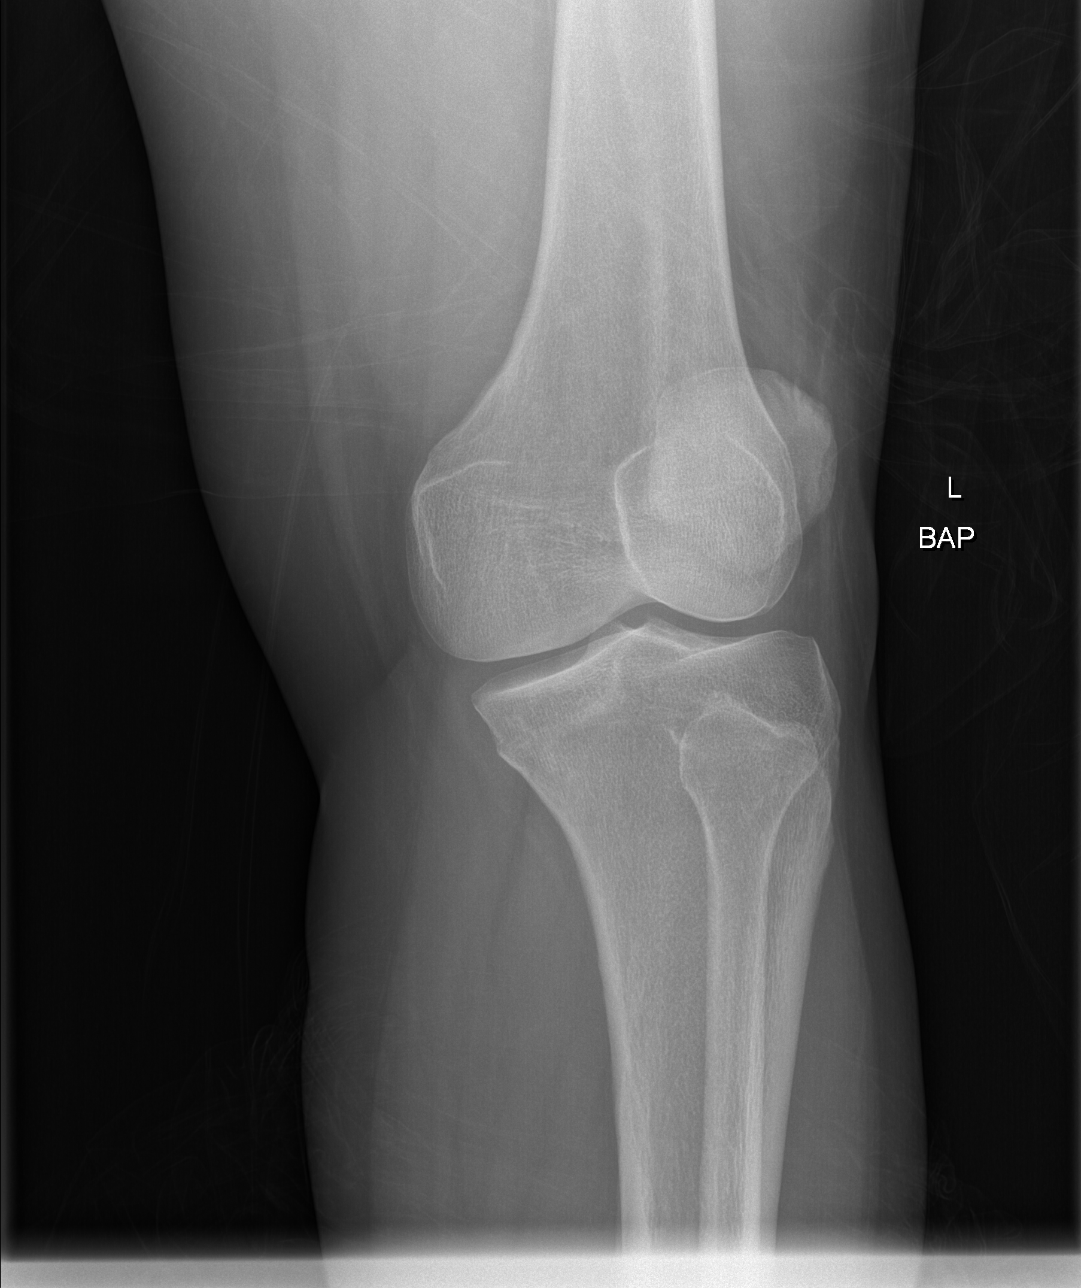

[x knee lat left]
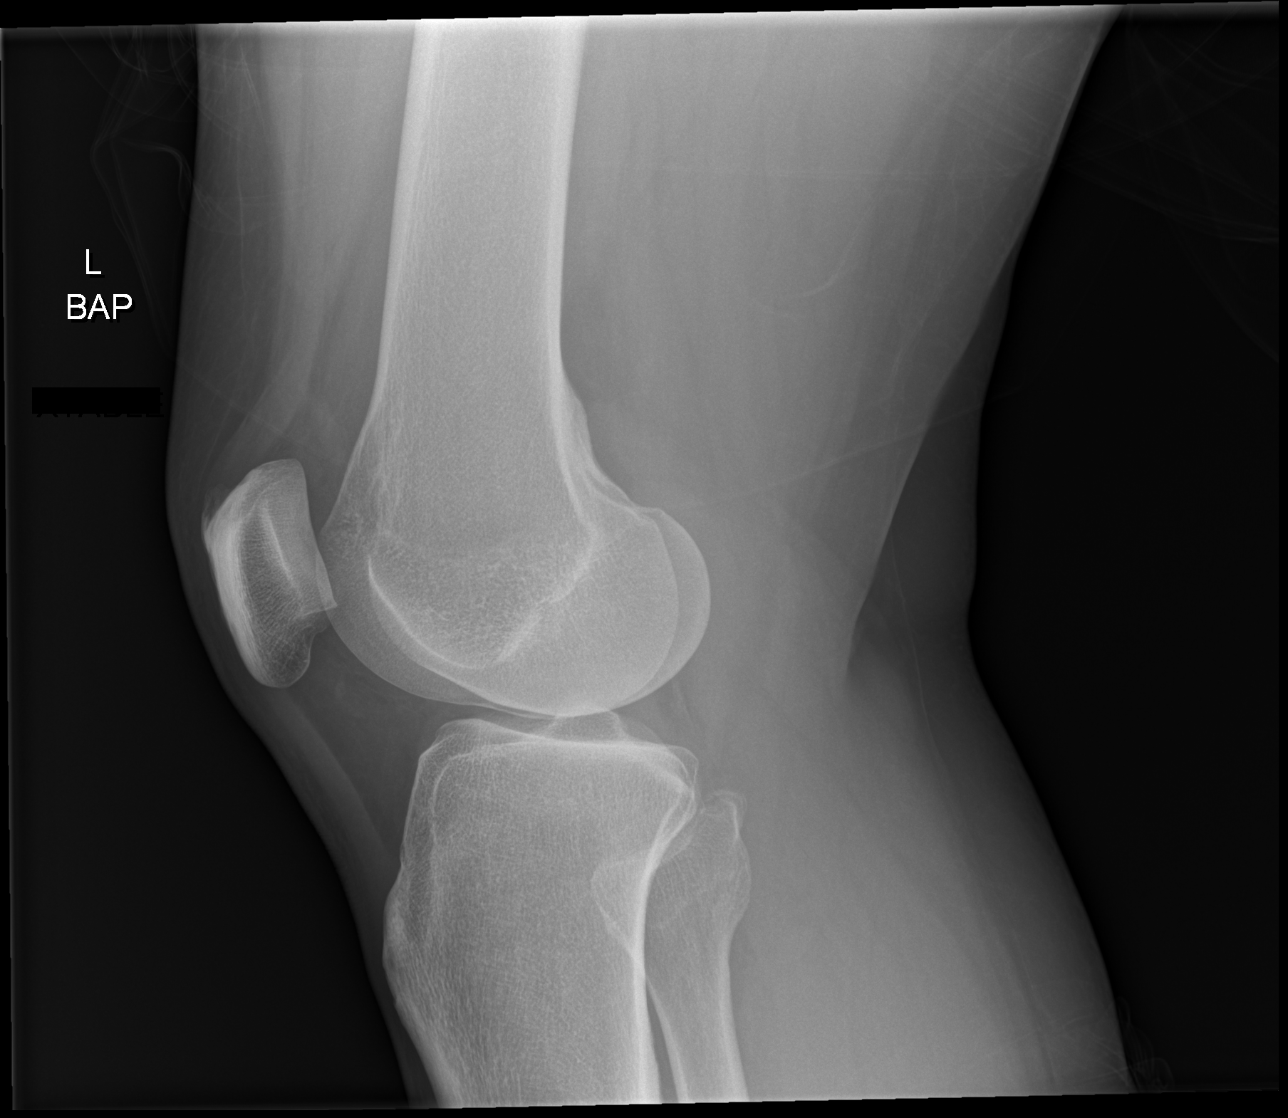

[4 of 4 positions shown; findings below may reference images not displayed]

FINDINGS: No evidence of fracture, dislocation, or joint effusion. No evidence
of arthropathy or other focal bone abnormality. Soft tissues are
unremarkable.
IMPRESSION: No acute abnormality noted.

## 2020-07-23 ENCOUNTER — Telehealth: Payer: Self-pay | Admitting: Gastroenterology

## 2020-07-23 NOTE — Telephone Encounter (Signed)
Received page to on-call.  Exacerbation of hemorrhoids for the last 2 days described as BRBPR.  Relatively small volume, similar to previous episodes.  Known history of grade 3 hemorrhoids exacerbated by constipation.  Otherwise hemodynamically stable, tolerating p.o., and without other complaints.  Was not using the topical hydrocortisone.  Recommended conservative management with topical Preparation H, sitz bath.  Is scheduled for colonoscopy, with likely plan for hemorrhoid band ligation following that pending results.  All questions answered and appreciative of phone call.

## 2020-07-31 ENCOUNTER — Encounter: Payer: Self-pay | Admitting: Internal Medicine

## 2020-07-31 ENCOUNTER — Other Ambulatory Visit: Payer: Self-pay

## 2020-07-31 ENCOUNTER — Ambulatory Visit (INDEPENDENT_AMBULATORY_CARE_PROVIDER_SITE_OTHER): Payer: Medicare HMO | Admitting: Internal Medicine

## 2020-07-31 VITALS — BP 129/84 | HR 86 | Temp 98.7°F | Ht 66.0 in | Wt 195.2 lb

## 2020-07-31 DIAGNOSIS — R634 Abnormal weight loss: Secondary | ICD-10-CM

## 2020-07-31 DIAGNOSIS — F17210 Nicotine dependence, cigarettes, uncomplicated: Secondary | ICD-10-CM

## 2020-07-31 DIAGNOSIS — F17200 Nicotine dependence, unspecified, uncomplicated: Secondary | ICD-10-CM

## 2020-07-31 DIAGNOSIS — M25562 Pain in left knee: Secondary | ICD-10-CM

## 2020-07-31 DIAGNOSIS — M170 Bilateral primary osteoarthritis of knee: Secondary | ICD-10-CM | POA: Diagnosis not present

## 2020-07-31 DIAGNOSIS — Z122 Encounter for screening for malignant neoplasm of respiratory organs: Secondary | ICD-10-CM | POA: Diagnosis not present

## 2020-07-31 MED ORDER — DICLOFENAC SODIUM 1 % EX GEL: 2.0000 g | CUTANEOUS | 0 refills | Status: DC | PRN

## 2020-07-31 NOTE — Patient Instructions (Signed)
Thank you for trusting me with your care. To recap, today we discussed the following:   1. Weight loss  - TSH - CMP14 + Anion Gap - CBC with Diff  2. Encounter for screening for lung cancer - CT CHEST LUNG CA SCREEN LOW DOSE W/O CM; Future

## 2020-08-01 ENCOUNTER — Encounter: Payer: Self-pay | Admitting: Internal Medicine

## 2020-08-01 ENCOUNTER — Other Ambulatory Visit: Payer: Self-pay | Admitting: *Deleted

## 2020-08-01 LAB — CMP14 + ANION GAP
ALT: 18 IU/L (ref 0–32)
AST: 18 IU/L (ref 0–40)
Albumin/Globulin Ratio: 2 (ref 1.2–2.2)
Albumin: 4.9 g/dL — ABNORMAL HIGH (ref 3.8–4.8)
Alkaline Phosphatase: 100 IU/L (ref 48–121)
Anion Gap: 16 mmol/L (ref 10.0–18.0)
BUN/Creatinine Ratio: 14 (ref 9–23)
BUN: 10 mg/dL (ref 6–24)
Bilirubin Total: 0.3 mg/dL (ref 0.0–1.2)
CO2: 26 mmol/L (ref 20–29)
Calcium: 10.2 mg/dL (ref 8.7–10.2)
Chloride: 101 mmol/L (ref 96–106)
Creatinine, Ser: 0.69 mg/dL (ref 0.57–1.00)
GFR calc Af Amer: 117 mL/min/{1.73_m2} (ref >59)
GFR calc non Af Amer: 102 mL/min/{1.73_m2} (ref >59)
Globulin, Total: 2.5 g/dL (ref 1.5–4.5)
Glucose: 90 mg/dL (ref 65–99)
Potassium: 4.5 mmol/L (ref 3.5–5.2)
Sodium: 143 mmol/L (ref 134–144)
Total Protein: 7.4 g/dL (ref 6.0–8.5)

## 2020-08-01 LAB — CBC WITH DIFFERENTIAL/PLATELET
Basophils Absolute: 0.1 10*3/uL (ref 0.0–0.2)
Basos: 1 %
EOS (ABSOLUTE): 0.1 10*3/uL (ref 0.0–0.4)
Eos: 1 %
Hematocrit: 40.5 % (ref 34.0–46.6)
Hemoglobin: 13.4 g/dL (ref 11.1–15.9)
Immature Grans (Abs): 0 10*3/uL (ref 0.0–0.1)
Immature Granulocytes: 1 %
Lymphocytes Absolute: 2.9 10*3/uL (ref 0.7–3.1)
Lymphs: 40 %
MCH: 27.5 pg (ref 26.6–33.0)
MCHC: 33.1 g/dL (ref 31.5–35.7)
MCV: 83 fL (ref 79–97)
Monocytes Absolute: 0.4 10*3/uL (ref 0.1–0.9)
Monocytes: 5 %
Neutrophils Absolute: 3.9 10*3/uL (ref 1.4–7.0)
Neutrophils: 52 %
Platelets: 411 10*3/uL (ref 150–450)
RBC: 4.88 x10E6/uL (ref 3.77–5.28)
RDW: 14.2 % (ref 11.7–15.4)
WBC: 7.3 10*3/uL (ref 3.4–10.8)

## 2020-08-01 LAB — TSH: TSH: 0.771 u[IU]/mL (ref 0.450–4.500)

## 2020-08-01 MED ORDER — MIRTAZAPINE 15 MG PO TBDP: ORAL_TABLET | ORAL | 0 refills | Status: DC

## 2020-08-01 MED ORDER — GABAPENTIN 300 MG PO CAPS: 300.0000 mg | ORAL_CAPSULE | Freq: Three times a day (TID) | ORAL | 0 refills | Status: DC

## 2020-08-01 MED ORDER — HYDROXYZINE PAMOATE 50 MG PO CAPS: 50.0000 mg | ORAL_CAPSULE | Freq: Three times a day (TID) | ORAL | 0 refills | Status: DC | PRN

## 2020-08-01 MED ORDER — QUETIAPINE FUMARATE ER 400 MG PO TB24: 1200.0000 mg | ORAL_TABLET | Freq: Every day | ORAL | 0 refills | Status: DC

## 2020-08-01 MED ORDER — LAMOTRIGINE 100 MG PO TABS: 100.0000 mg | ORAL_TABLET | Freq: Every day | ORAL | 0 refills | Status: DC

## 2020-08-01 MED ORDER — QUETIAPINE FUMARATE 400 MG PO TABS: 200.0000 mg | ORAL_TABLET | Freq: Every day | ORAL | 0 refills | Status: DC

## 2020-08-01 NOTE — Telephone Encounter (Signed)
Contacted patient via phone call. Reports that she has enough medications to last her this week. Reports that she is currently with program called [facility], however she is leaving them because they require you to answer their one phone call a month or else they will not refill medications for that month. Reports that she is switching to [facility], with first appointment on 09/21/20. Asking for medication refills for her mental health medications until that time. Confirmed with patient which medications she needed refilled and dosages of each medication. Will bridge medication refills until appointment with [facility] to prevent any acute psychiatric events.

## 2020-08-01 NOTE — Assessment & Plan Note (Signed)
Refilled Voltaren gel for bilateral knee osteoarthritis.

## 2020-08-01 NOTE — Assessment & Plan Note (Signed)
Patient reports tobacco use since she was a teenager, but this has been off and on.  She is currently trying to quit.  She has no shortness of breath or cough today.  However she has had 20 pounds weight loss for the past 6 months.  Part of the work-up is age-appropriate cancer screening. Plan: Low-dose CT scan for lung cancer screening

## 2020-08-01 NOTE — Assessment & Plan Note (Signed)
Continues to try to quit smoking.  She is currently smoking approximately half a pack a day.  She is interested in Wellbutrin, but given her recent weight loss we will postpone starting this medication again. Patient reports success with Wellbutrin in the past and said this medication was chosen because it did not interact with her other medications. Dr. Maudie Mercury, Clinical Pharmacist was counseled patient in the past and she relapsed. Encourage patient it takes multiple attempts sometimes to quit. Plan: After we have completed work-up for unexplained weight loss, discuss starting Wellbutrin.  Encourage attempts to cut back on the amount of cigarettes per day.

## 2020-08-01 NOTE — Telephone Encounter (Signed)
Pt states she is leaving her thrapist and will start at [place], will need meds til establish w/ [place] [facility]/. She states she will need refills til appt 9/30 with new mental health appt

## 2020-08-01 NOTE — Assessment & Plan Note (Signed)
Patient presents for evaluation of weight loss.  She reports she has had 20 pounds of weight loss in the past 6 months.  On review of our records this is very accurate.  She says she has been eating only 1 meal a day due to loss of appetite.  Denies any nausea or vomiting.  Denies any difficulty obtaining food.  She says this weight loss is unintentional.  She denies any other changes symptoms recently.  She did start mirtazapine about 6 months ago.  I am not aware of this being a common side effect but we will keep this on the differential.  Her BMI is 31.5.  Given sudden weight loss we have review age-appropriate cancer screening.  She had a hysterectomy at age 49, she is up-to-date on her mammograms, and has a colonoscopy scheduled. We will send her for lung cancer screening and workup with labs below.  Plan: CBC CMP TSH

## 2020-08-02 NOTE — Progress Notes (Signed)
Internal Medicine Clinic Attending  Case discussed with Dr. Steen at the time of the visit.  We reviewed the resident's history and exam and pertinent patient test results.  I agree with the assessment, diagnosis, and plan of care documented in the resident's note.  Alexander Raines, M.D., Ph.D.  

## 2020-08-11 ENCOUNTER — Ambulatory Visit (AMBULATORY_SURGERY_CENTER): Payer: Medicare HMO | Admitting: Gastroenterology

## 2020-08-11 ENCOUNTER — Encounter: Payer: Self-pay | Admitting: Gastroenterology

## 2020-08-11 ENCOUNTER — Other Ambulatory Visit: Payer: Self-pay

## 2020-08-11 VITALS — BP 134/75 | HR 72 | Temp 97.5°F | Resp 18 | Ht 66.0 in | Wt 196.0 lb

## 2020-08-11 DIAGNOSIS — K5520 Angiodysplasia of colon without hemorrhage: Secondary | ICD-10-CM | POA: Diagnosis not present

## 2020-08-11 DIAGNOSIS — K921 Melena: Secondary | ICD-10-CM

## 2020-08-11 DIAGNOSIS — D125 Benign neoplasm of sigmoid colon: Secondary | ICD-10-CM

## 2020-08-11 DIAGNOSIS — K635 Polyp of colon: Secondary | ICD-10-CM

## 2020-08-11 DIAGNOSIS — K5730 Diverticulosis of large intestine without perforation or abscess without bleeding: Secondary | ICD-10-CM

## 2020-08-11 DIAGNOSIS — K642 Third degree hemorrhoids: Secondary | ICD-10-CM

## 2020-08-11 MED ORDER — SODIUM CHLORIDE 0.9 % IV SOLN
500.0000 mL | Freq: Once | INTRAVENOUS | Status: DC
Start: 2020-08-11 — End: 2020-08-11

## 2020-08-11 NOTE — Patient Instructions (Signed)
Handouts given:  Diverticulosis, Hemorrhoids, High Fiber diet, Polyps START HIGH FIBER DIET CONTINUE PRESENT MEDICATIONS AWAIT PATHOLOGY RESULTS SCHEDULE AND APPOINTMENT FOR HEMORRHOIDAL BANDING   YOU HAD AN ENDOSCOPIC PROCEDURE TODAY AT [facility]:   Refer to the procedure report that was given to you for any specific questions about what was found during the examination.  If the procedure report does not answer your questions, please call your gastroenterologist to clarify.  If you requested that your care partner not be given the details of your procedure findings, then the procedure report has been included in a sealed envelope for you to review at your convenience later.  YOU SHOULD EXPECT: Some feelings of bloating in the abdomen. Passage of more gas than usual.  Walking can help get rid of the air that was put into your GI tract during the procedure and reduce the bloating. If you had a lower endoscopy (such as a colonoscopy or flexible sigmoidoscopy) you may notice spotting of blood in your stool or on the toilet paper. If you underwent a bowel prep for your procedure, you may not have a normal bowel movement for a few days.  Please Note:  You might notice some irritation and congestion in your nose or some drainage.  This is from the oxygen used during your procedure.  There is no need for concern and it should clear up in a day or so.  SYMPTOMS TO REPORT IMMEDIATELY:   Following lower endoscopy (colonoscopy or flexible sigmoidoscopy):  Excessive amounts of blood in the stool  Significant tenderness or worsening of abdominal pains  Swelling of the abdomen that is new, acute  Fever of 100F or higher   For urgent or emergent issues, a gastroenterologist can be reached at any hour by calling [phone]. Do not use MyChart messaging for urgent concerns.    DIET:  We do recommend a small meal at first, but then you may proceed to your regular diet.  Drink plenty  of fluids but you should avoid alcoholic beverages for 24 hours.  ACTIVITY:  You should plan to take it easy for the rest of today and you should NOT DRIVE or use heavy machinery until tomorrow (because of the sedation medicines used during the test).    FOLLOW UP: Our staff will call the number listed on your records 48-72 hours following your procedure to check on you and address any questions or concerns that you may have regarding the information given to you following your procedure. If we do not reach you, we will leave a message.  We will attempt to reach you two times.  During this call, we will ask if you have developed any symptoms of COVID 19. If you develop any symptoms (ie: fever, flu-like symptoms, shortness of breath, cough etc.) before then, please call [phone].  If you test positive for Covid 19 in the 2 weeks post procedure, please call and report this information to [place].    If any biopsies were taken you will be contacted by phone or by letter within the next 1-3 weeks.  Please call us at [phone] if you have not heard about the biopsies in 3 weeks.    SIGNATURES/CONFIDENTIALITY: You and/or your care partner have signed paperwork which will be entered into your electronic medical record.  These signatures attest to the fact that that the information above on your After Visit Summary has been reviewed and is understood.  Full responsibility of the confidentiality of  this discharge information lies with you and/or your care-partner.

## 2020-08-11 NOTE — Op Note (Signed)
[facility] Patient Name: Jasmine Jackson Procedure Date: 08/11/2020 1:25 PM MRN: [id number] Endoscopist: Ladene Artist , MD Age: 49 Referring MD:  Date of Birth: 1971-01-24 Gender: Female Account #: [id number] Procedure:                Colonoscopy Indications:              Hematochezia Medicines:                Monitored Anesthesia Care Procedure:                Pre-Anesthesia Assessment:                           - Prior to the procedure, a History and Physical                            was performed, and patient medications and                            allergies were reviewed. The patient's tolerance of                            previous anesthesia was also reviewed. The risks                            and benefits of the procedure and the sedation                            options and risks were discussed with the patient.                            All questions were answered, and informed consent                            was obtained. Prior Anticoagulants: The patient has                            taken no previous anticoagulant or antiplatelet                            agents. ASA Grade Assessment: III - A patient with                            severe systemic disease. After reviewing the risks                            and benefits, the patient was deemed in                            satisfactory condition to undergo the procedure.                           After obtaining informed consent, the colonoscope  was passed under direct vision. Throughout the                            procedure, the patient's blood pressure, pulse, and                            oxygen saturations were monitored continuously. The                            Colonoscope was introduced through the anus and                            advanced to the the cecum, identified by                            appendiceal orifice and ileocecal valve. The                             ileocecal valve, appendiceal orifice, and rectum                            were photographed. The quality of the bowel                            preparation was good. The colonoscopy was performed                            without difficulty. The patient tolerated the                            procedure well. Scope In: 1:33:06 PM Scope Out: 1:52:20 PM Scope Withdrawal Time: 0 hours 14 minutes 58 seconds  Total Procedure Duration: 0 hours 19 minutes 14 seconds  Findings:                 The perianal and digital rectal examinations were                            normal.                           Five sessile polyps were found in the sigmoid                            colon. The polyps were 6 to 7 mm in size. These                            polyps were removed with a cold snare. Resection                            and retrieval were complete.                           A single medium-sized localized angiodysplastic  lesion without bleeding was found in the ascending                            colon.                           Scattered medium-mouthed diverticula were found in                            the right colon. There was no evidence of                            diverticular bleeding.                           Internal hemorrhoids were found during                            retroflexion. The hemorrhoids were large and Grade                            III (internal hemorrhoids that prolapse but require                            manual reduction).                           The exam was otherwise without abnormality on                            direct and retroflexion views. Complications:            No immediate complications. Estimated blood loss:                            None. Estimated Blood Loss:     Estimated blood loss: none. Impression:               - Five 6 to 7 mm polyps in the sigmoid colon,                             removed with a cold snare. Resected and retrieved.                           - Ascending colon AVM, non-bleeding.                           - Mild diverticulosis in the right colon.                           - Internal hemorrhoids.                           - The examination was otherwise normal on direct                            and retroflexion views.  Recommendation:           - Repeat colonoscopy after studies are complete for                            surveillance based on pathology results.                           - Patient has a contact number available for                            emergencies. The signs and symptoms of potential                            delayed complications were discussed with the                            patient. Return to normal activities tomorrow.                            Written discharge instructions were provided to the                            patient.                           - High fiber diet.                           - Continue present medications.                           - Await pathology results.                           - Schedule appointment for hemorrhoid banding. Ladene Artist, MD 08/11/2020 1:57:31 PM This report has been signed electronically.

## 2020-08-11 NOTE — Progress Notes (Signed)
Called to room to assist during endoscopic procedure.  Patient ID and intended procedure confirmed with present staff. Received instructions for my participation in the procedure from the performing physician.  

## 2020-08-11 NOTE — Progress Notes (Signed)
Pt's states no medical or surgical changes since previsit or office visit.  CW - vitals 

## 2020-08-11 NOTE — Progress Notes (Signed)
Report to PACU, RN, vss, BBS= Clear.  

## 2020-08-14 ENCOUNTER — Telehealth: Payer: Self-pay

## 2020-08-14 NOTE — Telephone Encounter (Signed)
Left message for patient to call back to discuss hem banding.

## 2020-08-15 ENCOUNTER — Telehealth: Payer: Self-pay

## 2020-08-15 NOTE — Telephone Encounter (Signed)
LVM

## 2020-08-16 NOTE — Telephone Encounter (Signed)
My Chart message sent to the patient.

## 2020-08-17 ENCOUNTER — Telehealth: Payer: Self-pay | Admitting: Internal Medicine

## 2020-08-17 NOTE — Telephone Encounter (Signed)
Application for disability placard placed in [employer] box for completion. Hubbard Hartshorn, BSN, RN-BC

## 2020-08-17 NOTE — Telephone Encounter (Signed)
Pt calling about her Placcard that will expire on 08/23/2020.  Pt requesting a call back.

## 2020-08-17 NOTE — Telephone Encounter (Signed)
I contacted patient that her Parking Placard was ready and asked if she wants to pick up or mail to her.The patient wanted [place] to mail the form to her.I repeated address that we have on filed. I also let patient no there are 2 places she would need to sign [place], [place] C8/26/20213:24 PM

## 2020-08-23 ENCOUNTER — Ambulatory Visit (HOSPITAL_COMMUNITY)
Admission: RE | Admit: 2020-08-23 | Discharge: 2020-08-23 | Disposition: A | Discharge status: Home / Self Care | Payer: Medicare HMO | Source: Ambulatory Visit | Attending: Internal Medicine | Admitting: Internal Medicine

## 2020-08-23 ENCOUNTER — Other Ambulatory Visit: Payer: Self-pay | Admitting: Student in an Organized Health Care Education/Training Program

## 2020-08-23 ENCOUNTER — Other Ambulatory Visit: Payer: Self-pay

## 2020-08-23 ENCOUNTER — Encounter (HOSPITAL_COMMUNITY): Payer: Self-pay

## 2020-08-23 DIAGNOSIS — J8410 Pulmonary fibrosis, unspecified: Secondary | ICD-10-CM | POA: Insufficient documentation

## 2020-08-23 DIAGNOSIS — J9811 Atelectasis: Secondary | ICD-10-CM | POA: Insufficient documentation

## 2020-08-23 DIAGNOSIS — Z122 Encounter for screening for malignant neoplasm of respiratory organs: Secondary | ICD-10-CM | POA: Insufficient documentation

## 2020-08-23 DIAGNOSIS — R634 Abnormal weight loss: Secondary | ICD-10-CM | POA: Diagnosis present

## 2020-08-23 MED ORDER — IOHEXOL 300 MG/ML  SOLN
75.0000 mL | Freq: Once | INTRAMUSCULAR | Status: AC | PRN
  Administered 2020-08-23: 75 mL via INTRAVENOUS

## 2020-08-23 NOTE — Progress Notes (Signed)
I am correcting an order from 07/31/20. 49 year old person with tobacco use and unintentional weight loss. Dr. Court Joy wanted to rule out pulmonary malignancy as a cause. This is not a screening test, rather diagnostic CT chest with contrast. I have placed a new order.

## 2020-08-24 ENCOUNTER — Encounter: Payer: Self-pay | Admitting: Gastroenterology

## 2020-08-25 NOTE — Progress Notes (Signed)
Patient updated on results of CT chest by voicemail and mychart.

## 2020-09-18 ENCOUNTER — Other Ambulatory Visit: Payer: Self-pay | Admitting: Student

## 2020-09-19 NOTE — Telephone Encounter (Signed)
Is she still seeing psychiatry? She is on a few different medications for her bipolar disorder, and it would be best if they are providing refills since we are unable to see the notes.

## 2020-09-20 ENCOUNTER — Encounter: Payer: Self-pay | Admitting: Internal Medicine

## 2020-09-20 ENCOUNTER — Ambulatory Visit (INDEPENDENT_AMBULATORY_CARE_PROVIDER_SITE_OTHER): Payer: Medicare HMO | Admitting: Internal Medicine

## 2020-09-20 ENCOUNTER — Other Ambulatory Visit: Payer: Self-pay

## 2020-09-20 DIAGNOSIS — K115 Sialolithiasis: Secondary | ICD-10-CM | POA: Diagnosis not present

## 2020-09-20 MED ORDER — BIOTENE DRY MOUTH MT LIQD: 15.0000 mL | OROMUCOSAL | 0 refills | Status: DC | PRN

## 2020-09-20 NOTE — Patient Instructions (Addendum)
To Ms. Cuadra,  It was a pleasure meeting you today. Today we discussed your left sided tooth pain. On physical examination, it appears that your may have a salivary gland stone. Please use warm compresses, and massage your swollen gland. Please suck on sour candies as well to produce saliva. I have put in an order for a dry mouth, mouthwash for you as well. I have also included an information packet in your paperwork. Have a good day!  Sincerely,  Maudie Mercury, MD

## 2020-09-20 NOTE — Progress Notes (Signed)
   CC: Lower L tooth Pain   HPI:  Jasmine Jackson is a 49 y.o. female, with a PMH noted below, who presents to the clinic for L lower tooth pain. To see the management of their acute and chronic conditions, please see the A&P note under the Encounters tab.    Past Medical History:  Diagnosis Date  . Anemia   . Anxiety   . Anxiety and depression   . Asthma   . Bipolar 1 disorder ([place])   . Depression   . Excessive daytime sleepiness 11/18/2017  . GERD (gastroesophageal reflux disease)   . Heart murmur   . Hemorrhoids   . Hiatal hernia   . Hypertension   . Internal hemorrhoids   . Toe injury 11/18/2017   Review of Systems:   Review of Systems  Constitutional: Negative for chills, fever, malaise/fatigue and weight loss.  HENT: Positive for ear pain. Negative for sinus pain and sore throat.        Dry mouth  Eyes: Negative for blurred vision, double vision, photophobia, pain and discharge.  Gastrointestinal: Negative for abdominal pain, diarrhea, nausea and vomiting.  Neurological: Negative for dizziness, tingling, tremors, sensory change, speech change and headaches.     Physical Exam:  Vitals:   09/20/20 1555  BP: (!) 139/93  Pulse: 93  Temp: 98.8 F (37.1 C)  TempSrc: Oral  SpO2: 100%  Weight: 194 lb 11.2 oz (88.3 kg)  Height: 5\' 6"  (1.676 m)   Physical Exam Constitutional:      General: She is not in acute distress.    Appearance: Normal appearance. She is not ill-appearing, toxic-appearing or diaphoretic.  HENT:     Head: Normocephalic and atraumatic.     Right Ear: Tympanic membrane, ear canal and external ear normal. There is no impacted cerumen.     Left Ear: Tympanic membrane, ear canal and external ear normal. There is no impacted cerumen.     Mouth/Throat:     Mouth: Mucous membranes are moist.     Pharynx: No oropharyngeal exudate or posterior oropharyngeal erythema.     Comments: L submandibular gland swollen, tender to palpation.  Eyes:      General:        Right eye: No discharge.        Left eye: Discharge present.    Conjunctiva/sclera: Conjunctivae normal.  Cardiovascular:     Rate and Rhythm: Normal rate.     Pulses: Normal pulses.     Heart sounds: Normal heart sounds. No murmur heard.  No friction rub. No gallop.   Pulmonary:     Effort: Pulmonary effort is normal.     Breath sounds: Normal breath sounds. No wheezing, rhonchi or rales.  Abdominal:     General: Abdomen is flat. Bowel sounds are normal.     Palpations: Abdomen is soft.  Neurological:     Mental Status: She is alert.     Assessment & Plan:   See Encounters Tab for problem based charting.  Patient discussed with Dr. Daryll Drown

## 2020-09-21 ENCOUNTER — Encounter: Payer: Self-pay | Admitting: Internal Medicine

## 2020-09-21 ENCOUNTER — Encounter (HOSPITAL_COMMUNITY): Payer: Self-pay | Admitting: Psychiatry

## 2020-09-21 ENCOUNTER — Ambulatory Visit (INDEPENDENT_AMBULATORY_CARE_PROVIDER_SITE_OTHER): Payer: Medicare HMO | Admitting: Psychiatry

## 2020-09-21 VITALS — BP 136/91 | HR 90 | Temp 99.1°F | Ht 65.0 in | Wt 191.5 lb

## 2020-09-21 DIAGNOSIS — F3170 Bipolar disorder, currently in remission, most recent episode unspecified: Secondary | ICD-10-CM

## 2020-09-21 DIAGNOSIS — F4310 Post-traumatic stress disorder, unspecified: Secondary | ICD-10-CM | POA: Diagnosis not present

## 2020-09-21 DIAGNOSIS — F419 Anxiety disorder, unspecified: Secondary | ICD-10-CM

## 2020-09-21 DIAGNOSIS — F411 Generalized anxiety disorder: Secondary | ICD-10-CM | POA: Insufficient documentation

## 2020-09-21 DIAGNOSIS — F410 Panic disorder [episodic paroxysmal anxiety] without agoraphobia: Secondary | ICD-10-CM

## 2020-09-21 DIAGNOSIS — K115 Sialolithiasis: Secondary | ICD-10-CM | POA: Insufficient documentation

## 2020-09-21 MED ORDER — MIRTAZAPINE 15 MG PO TBDP: ORAL_TABLET | ORAL | 2 refills | Status: DC

## 2020-09-21 MED ORDER — QUETIAPINE FUMARATE 400 MG PO TABS: 200.0000 mg | ORAL_TABLET | Freq: Every day | ORAL | 2 refills | Status: DC

## 2020-09-21 MED ORDER — LAMOTRIGINE 100 MG PO TABS: 100.0000 mg | ORAL_TABLET | Freq: Every day | ORAL | 2 refills | Status: DC

## 2020-09-21 MED ORDER — HYDROXYZINE HCL 50 MG PO TABS: 50.0000 mg | ORAL_TABLET | Freq: Three times a day (TID) | ORAL | 2 refills | Status: DC | PRN

## 2020-09-21 MED ORDER — QUETIAPINE FUMARATE ER 400 MG PO TB24: 1200.0000 mg | ORAL_TABLET | Freq: Every day | ORAL | 2 refills | Status: DC

## 2020-09-21 NOTE — Assessment & Plan Note (Signed)
Jasmine Jackson presents to the clinic after having two days of left lower jaw pain and ear pain. Patient states that her symptoms had been ongoing for two days now. She states that she also noticed a swelling under her jaw. She denies fatigue, sinus pain, or n/v, changes in vision or smell. She endorses occasional headaches for which she takes goodie powder, and also complains of having a dry mouth for a month and a half.   On examination, she is tender over the submandibular gland. There is no other lymphadenopathy appreciated. Her oral and ear exam is unremarkable. No fevers at the office, but she states that she did have a slight fever of 100.3 that was brief. Given presentation and tenderness over the submandibular gland, her presentation is likely sialolithiasis. We discussed warm compresses, sucking on sour candies, and massaging her submandibular gland. She was also provided with material on salivary stones.  - For dry mouth, Biotene mouth wash - Conservative measures - Education packet given.
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
## 2020-09-22 ENCOUNTER — Telehealth: Payer: Self-pay | Admitting: Internal Medicine

## 2020-09-22 NOTE — Telephone Encounter (Signed)
Pls contact pt [phone]

## 2020-09-22 NOTE — Telephone Encounter (Signed)
TC to patient, VM obtained and [place] compliant message left to call nurse triage back if still needed. SChaplin, RN,BSN

## 2020-09-25 NOTE — Progress Notes (Signed)
Internal Medicine Clinic Attending ? ?Case discussed with Dr. Winters  At the time of the visit.  We reviewed the resident?s history and exam and pertinent patient test results.  I agree with the assessment, diagnosis, and plan of care documented in the resident?s note.  ?

## 2020-10-17 ENCOUNTER — Other Ambulatory Visit: Payer: Self-pay

## 2020-10-17 MED ORDER — LINACLOTIDE 290 MCG PO CAPS: 290.0000 ug | ORAL_CAPSULE | Freq: Every day | ORAL | 3 refills | Status: DC

## 2020-10-18 ENCOUNTER — Other Ambulatory Visit: Payer: Self-pay | Admitting: Internal Medicine

## 2020-10-19 ENCOUNTER — Other Ambulatory Visit: Payer: Self-pay | Admitting: Internal Medicine

## 2020-10-19 ENCOUNTER — Other Ambulatory Visit: Payer: Self-pay

## 2020-10-19 DIAGNOSIS — R232 Flushing: Secondary | ICD-10-CM

## 2020-10-19 DIAGNOSIS — M62838 Other muscle spasm: Secondary | ICD-10-CM

## 2020-10-19 MED ORDER — TIZANIDINE HCL 4 MG PO TABS: 4.0000 mg | ORAL_TABLET | Freq: Three times a day (TID) | ORAL | 0 refills | Status: DC | PRN

## 2020-10-19 MED ORDER — LINACLOTIDE 290 MCG PO CAPS
290.0000 ug | ORAL_CAPSULE | Freq: Every day | ORAL | 3 refills | Status: DC
Start: 2020-10-19 — End: 2021-12-31

## 2020-10-19 MED ORDER — HYDROCHLOROTHIAZIDE 25 MG PO TABS
ORAL_TABLET | ORAL | 1 refills | Status: DC
Start: 2020-10-19 — End: 2021-01-25

## 2020-10-19 MED ORDER — GABAPENTIN 300 MG PO CAPS: 300.0000 mg | ORAL_CAPSULE | Freq: Three times a day (TID) | ORAL | 0 refills | Status: DC

## 2020-11-08 ENCOUNTER — Other Ambulatory Visit: Payer: Self-pay | Admitting: Internal Medicine

## 2020-11-08 DIAGNOSIS — M62838 Other muscle spasm: Secondary | ICD-10-CM

## 2020-11-09 NOTE — Telephone Encounter (Signed)
Per Dr. Sammie Bench note from 06/14/2020, patient was to be re-evaluated for use of this medication and effectiveness. Will hold off on re-filling until patient is evaluated in office for this. Thanks

## 2020-11-09 NOTE — Telephone Encounter (Signed)
Patient needs an appt to discuss efficacy of this medication

## 2020-11-17 ENCOUNTER — Other Ambulatory Visit: Payer: Self-pay | Admitting: Internal Medicine

## 2020-11-21 ENCOUNTER — Telehealth: Payer: Self-pay

## 2020-11-21 NOTE — Telephone Encounter (Signed)
Well, I do not prescribe Gabapentin to her. It is prescribed by her PCP Dr. Marianna Payment. I don't see where she is prescribed Klonopin by any provider. Thanks.

## 2020-11-21 NOTE — Telephone Encounter (Signed)
received a voicemail message that pt needs a refill of klonopin and gabapentin sent to [place].

## 2020-11-24 ENCOUNTER — Other Ambulatory Visit (HOSPITAL_COMMUNITY): Payer: Self-pay | Admitting: Psychiatry

## 2020-11-24 DIAGNOSIS — F3170 Bipolar disorder, currently in remission, most recent episode unspecified: Secondary | ICD-10-CM

## 2020-11-28 ENCOUNTER — Telehealth: Payer: Self-pay

## 2020-11-28 NOTE — Telephone Encounter (Signed)
Called pt-no answer, LVM regarding disability parking placard is ready for pick up.

## 2020-11-29 ENCOUNTER — Telehealth: Payer: Self-pay | Admitting: Internal Medicine

## 2020-11-29 NOTE — Telephone Encounter (Signed)
Was dx 2 days ago with Shingles at Urgent [facility] at [place] on [address].  On day 1 pain level was a 4 and today is level 10. Would like a call back from our office since unable to get in contact with provider who saw her there.

## 2020-11-29 NOTE — Telephone Encounter (Signed)
Can she come into clinic tomorrow to be seen?

## 2020-11-30 NOTE — Telephone Encounter (Signed)
Called pt to schedule an appt tomorrow about her pain - stated she can come tomorrow. Appt schedule on Friday @ 1015 AM with Dr Sharon Seller.

## 2020-12-01 ENCOUNTER — Encounter: Payer: Medicare HMO | Admitting: Internal Medicine

## 2020-12-12 ENCOUNTER — Telehealth (HOSPITAL_COMMUNITY): Payer: Medicare HMO | Admitting: Psychiatry

## 2020-12-12 ENCOUNTER — Other Ambulatory Visit: Payer: Self-pay

## 2020-12-13 ENCOUNTER — Ambulatory Visit (INDEPENDENT_AMBULATORY_CARE_PROVIDER_SITE_OTHER): Payer: Medicare HMO | Admitting: Student

## 2020-12-13 ENCOUNTER — Encounter: Payer: Self-pay | Admitting: Student

## 2020-12-13 ENCOUNTER — Telehealth: Payer: Self-pay | Admitting: *Deleted

## 2020-12-13 ENCOUNTER — Other Ambulatory Visit: Payer: Self-pay

## 2020-12-13 DIAGNOSIS — J069 Acute upper respiratory infection, unspecified: Secondary | ICD-10-CM | POA: Insufficient documentation

## 2020-12-13 NOTE — Assessment & Plan Note (Signed)
Patient reports on Sunday evening, she slept with her window open. Upon awakening, she was concerned that she would get a cold from leaving open her window. She endorses developing a mild cough over the course of the next day which is productive of a white, bubbly substance. Otherwise, she endorses the urge to frequently blow her nose. She denies sneezing, rhinitis, congestion, sinus pressure/pain, headaches, fevers, chills, muscle pain, joint pain. She is primarily concerned because she wishes to visit her grandmother on Christmas day who is 52 years old. She reports that she is vaccinated x3 against COVID and wears two masks as well as a face shield. She denies any known sick contacts.   Objectively, patient did not cough throughout the sixteen minutes of our conversation. She is breathing comfortably and speaking in full sentences without difficulty.   Overall, patient's presentation may be most consistent with a mild, viral upper respiratory infection. Given her symptoms, patient's presentation is concerning for possible COVID infection.  -Instructed patient to obtain PCR nasopharyngeal swab today or tomorrow morning -Advised patient to obtain rapid test on Christmas day -If both tests were to be negative and patient's symptoms were to improve, there may be low risk of her to visit her grandmother on Christmas day -Patient has been instructed to remain masked and socially distanced when around others -Patient has been advised to contact our clinic or present to the emergency department immediately if her symptoms were to worsen

## 2020-12-13 NOTE — Telephone Encounter (Signed)
Patient called in stating she slept with the window open on a cold and rainy night. Now with 2 day hx of Nasal congestion and cough productive of light green sputum. States she is scheduled to see her 50 yo grandmother on christmas and doesn't want to get her sick. Tele appt scheduled today with [facility]. Hubbard Hartshorn, BSN, RN-BC

## 2020-12-13 NOTE — Progress Notes (Signed)
  [facility] Internal Medicine Residency Telephone Encounter Continuity Care Appointment  HPI:   This telephone encounter was created for [name] on 12/13/2020 for the following purpose/cc cough.  Patient reports on Sunday evening, she slept with her window open. Upon awakening, she was concerned that she would get a cold from leaving open her window. She endorses developing a mild cough over the course of the next day which is productive of a white, bubbly substance. Otherwise, she endorses the urge to frequently blow her nose. She denies sneezing, rhinitis, congestion, sinus pressure/pain, headaches, fevers, chills, muscle pain, joint pain. She is primarily concerned because she wishes to visit her grandmother on Christmas day who is 66 years old. She reports that she is vaccinated x3 against COVID and wears two masks as well as a face shield. She denies any known sick contacts.   Past Medical History:  Past Medical History:  Diagnosis Date  . Anemia   . Anxiety   . Anxiety and depression   . Asthma   . Bipolar 1 disorder ([place])   . Depression   . Excessive daytime sleepiness 11/18/2017  . GERD (gastroesophageal reflux disease)   . Heart murmur   . Hemorrhoids   . Hiatal hernia   . Hypertension   . Internal hemorrhoids   . Toe injury 11/18/2017      ROS:   Endorses cough. Denies sneezing, rhinitis, congestion, sinus pressure/pain, headaches, fevers, chills, muscle pain, joint pain.   Assessment / Plan / Recommendations:   Please see A&P under problem oriented charting for assessment of the patient's acute and chronic medical conditions.   As always, pt is advised that if symptoms worsen or new symptoms arise, they should go to an urgent care facility or to to ER for further evaluation.   Consent and Medical Decision Making:   Patient seen with Dr. Evette Doffing  This is a telephone encounter between Jasmine Jackson and Foy Guadalajara on 12/13/2020 for cough. The visit  was conducted with the patient located at home and Foy Guadalajara at [facility]. The patient's identity was confirmed using their DOB and current address. The patient has consented to being evaluated through a telephone encounter and understands the associated risks (an examination cannot be done and the patient may need to come in for an appointment) / benefits (allows the patient to remain at home, decreasing exposure to coronavirus). I personally spent 16 minutes on medical discussion.

## 2020-12-14 ENCOUNTER — Other Ambulatory Visit: Payer: Medicare HMO

## 2020-12-14 DIAGNOSIS — Z20822 Contact with and (suspected) exposure to covid-19: Secondary | ICD-10-CM

## 2020-12-14 NOTE — Progress Notes (Signed)
Internal Medicine Clinic Attending  I was present for this telephone visit.  I personally confirmed the key portions of the history and exam documented by Dr. Wynetta Emery and I reviewed pertinent patient test results.  The assessment, diagnosis, and plan were formulated together and I agree with the documentation in the resident's note.

## 2020-12-15 ENCOUNTER — Other Ambulatory Visit: Payer: Self-pay | Admitting: Internal Medicine

## 2020-12-17 LAB — NOVEL CORONAVIRUS, NAA: SARS-CoV-2, NAA: NOT DETECTED

## 2020-12-18 ENCOUNTER — Telehealth: Payer: Self-pay | Admitting: *Deleted

## 2020-12-18 MED ORDER — BIOTENE DRY MOUTH MT LIQD: 15.0000 mL | OROMUCOSAL | 0 refills | Status: DC | PRN

## 2020-12-18 NOTE — Telephone Encounter (Signed)
Next appt scheduled 12/26/20 with PCP.

## 2020-12-18 NOTE — Telephone Encounter (Signed)
Pt states her COVID was negative, she would like to now dr's intention on treating the URI, she needs a call back from MD asap at [phone]

## 2020-12-18 NOTE — Telephone Encounter (Signed)
I do not know the answer to his question.  He would need to call his insurance.

## 2020-12-19 ENCOUNTER — Telehealth (HOSPITAL_COMMUNITY): Payer: Medicare HMO | Admitting: Psychiatry

## 2020-12-19 MED ORDER — GUAIFENESIN-DM 100-10 MG/5ML PO SYRP: 5.0000 mL | ORAL_SOLUTION | ORAL | 0 refills | Status: DC | PRN

## 2020-12-19 NOTE — Telephone Encounter (Signed)
Called patient an number requested.  No answer.  Please let me know if she returns call.

## 2020-12-19 NOTE — Telephone Encounter (Signed)
Tried to call pt, no answer, no vmail

## 2020-12-19 NOTE — Telephone Encounter (Signed)
I will send her in a cough syrup.   Advise to give -   Viral URIs take time to resolve.  OTC medication is adequate for treatment unless symptoms become severe.  She should seek out further care for severe SOB, fever, chills, nausea, vomiting, severe diarrhea, inability to tolerate PO.  Antibiotics are not needed.    Rx for cough syrup will be sent in.   Debe Coder, MD

## 2020-12-20 NOTE — Telephone Encounter (Signed)
Call from pt , stated she missed a call - informed of Dr Donnetta Hutching response ; stated understanding of cough syrup and if she develop any symptoms.

## 2020-12-23 ENCOUNTER — Other Ambulatory Visit (HOSPITAL_COMMUNITY): Payer: Self-pay | Admitting: Psychiatry

## 2020-12-23 DIAGNOSIS — F419 Anxiety disorder, unspecified: Secondary | ICD-10-CM

## 2020-12-23 DIAGNOSIS — F3170 Bipolar disorder, currently in remission, most recent episode unspecified: Secondary | ICD-10-CM

## 2020-12-24 ENCOUNTER — Other Ambulatory Visit (HOSPITAL_COMMUNITY): Payer: Self-pay | Admitting: Psychiatry

## 2020-12-24 DIAGNOSIS — F3170 Bipolar disorder, currently in remission, most recent episode unspecified: Secondary | ICD-10-CM

## 2020-12-25 NOTE — Telephone Encounter (Signed)
She is prescribed Gabapentin by Dr. Claudell Kyle, not from this clinic. Needs to contact their office.

## 2020-12-25 NOTE — Telephone Encounter (Signed)
PATIENT CALLED AFTER Rx INFORMED NEW SCRIPT NEED FOR gabapentin (NEURONTIN) 300 MG capsule

## 2020-12-26 ENCOUNTER — Encounter: Payer: Self-pay | Admitting: Internal Medicine

## 2020-12-26 ENCOUNTER — Ambulatory Visit (INDEPENDENT_AMBULATORY_CARE_PROVIDER_SITE_OTHER): Payer: Medicare HMO | Admitting: Internal Medicine

## 2020-12-26 ENCOUNTER — Telehealth (HOSPITAL_COMMUNITY): Payer: Self-pay | Admitting: *Deleted

## 2020-12-26 ENCOUNTER — Other Ambulatory Visit: Payer: Self-pay

## 2020-12-26 VITALS — BP 126/78 | HR 85 | Temp 98.3°F | Ht 66.0 in | Wt 201.4 lb

## 2020-12-26 DIAGNOSIS — R1013 Epigastric pain: Secondary | ICD-10-CM | POA: Diagnosis not present

## 2020-12-26 DIAGNOSIS — Z23 Encounter for immunization: Secondary | ICD-10-CM

## 2020-12-26 DIAGNOSIS — F319 Bipolar disorder, unspecified: Secondary | ICD-10-CM

## 2020-12-26 DIAGNOSIS — K581 Irritable bowel syndrome with constipation: Secondary | ICD-10-CM

## 2020-12-26 DIAGNOSIS — E876 Hypokalemia: Secondary | ICD-10-CM

## 2020-12-26 DIAGNOSIS — R112 Nausea with vomiting, unspecified: Secondary | ICD-10-CM

## 2020-12-26 NOTE — Patient Instructions (Signed)
Thank you, [name] for allowing [place] to provide your care today. Today we discussed Medications, Anxiety, nausea and vomitting.    I have ordered the following labs for you:   Lab Orders     BMP8+Anion Gap     Magnesium   Tests ordered today:  Gastric Emptying Study - you will get a call to schedule this.   Referrals ordered today:   Referral Orders  No referral(s) requested today     I have ordered the following medication/changed the following medications:   Stop the following medications: Try stopping your omeprazole and metoclopramide    Start the following medications: No orders of the defined types were placed in this encounter.    Follow up: 3-4 months    Remember: to callor message me if anything comes up or if you have any questions.   Should you have any questions or concerns please call the internal medicine clinic at [phone].     Jasmine Jackson, D.O. [facility]

## 2020-12-26 NOTE — Progress Notes (Signed)
CC: Abdominal Pain  HPI:  [name] is a 50 y.o. female with a past medical history stated below and presents today for abdominal pain, nausea, and vomitting. Please see problem based assessment and plan for additional details.  Past Medical History:  Diagnosis Date  . Anemia   . Anxiety   . Anxiety and depression   . Asthma   . Bipolar 1 disorder ([place])   . Depression   . Excessive daytime sleepiness 11/18/2017  . GERD (gastroesophageal reflux disease)   . Heart murmur   . Hemorrhoids   . Hiatal hernia   . Hypertension   . Internal hemorrhoids   . Toe injury 11/18/2017    Current Outpatient Medications on File Prior to Visit  Medication Sig Dispense Refill  . albuterol (VENTOLIN HFA) 108 (90 Base) MCG/ACT inhaler INHALE 2 PUFFS INTO THE LUNGS EVERY 6 (SIX) HOURS AS NEEDED FOR WHEEZING OR SHORTNESS OF BREATH. 3 each 0  . amLODipine (NORVASC) 5 MG tablet TAKE 1 TABLET(5 MG) BY MOUTH DAILY 90 tablet 1  . antiseptic oral rinse (BIOTENE) LIQD 15 mLs by Mouth Rinse route as needed for dry mouth. 237 mL 0  . diclofenac Sodium (VOLTAREN) 1 % GEL Apply 2 g topically as needed. 50 g 0  . dicyclomine (BENTYL) 20 MG tablet TAKE 1/2 TABLET (10 MG TOTAL) BY MOUTH 4 (FOUR) TIMES DAILY BEFORE MEALS AND AT BEDTIME. 180 tablet 1  . famotidine (PEPCID) 40 MG tablet Take 1 tablet (40 mg total) by mouth at bedtime. 30 tablet 11  . gabapentin (NEURONTIN) 300 MG capsule Take 1 capsule (300 mg total) by mouth 3 (three) times daily. 90 capsule 0  . guaiFENesin-dextromethorphan (ROBITUSSIN DM) 100-10 MG/5ML syrup Take 5 mLs by mouth every 4 (four) hours as needed for cough. 118 mL 0  . hydrochlorothiazide (HYDRODIURIL) 25 MG tablet Take one 25 mg tablet daily. 90 tablet 1  . hydrocortisone (ANUSOL-HC) 25 MG suppository Place 1 suppository (25 mg total) rectally daily. 30 suppository 0  . hydrOXYzine (ATARAX/VISTARIL) 50 MG tablet TAKE 1 TABLET(50 MG) BY MOUTH THREE TIMES DAILY AS NEEDED FOR  ANXIETY 90 tablet 2  . hydrOXYzine (VISTARIL) 50 MG capsule Take 1 capsule (50 mg total) by mouth 3 (three) times daily as needed. 90 capsule 0  . lamoTRIgine (LAMICTAL) 100 MG tablet TAKE 1 TABLET(100 MG) BY MOUTH DAILY 30 tablet 2  . linaclotide (LINZESS) 290 MCG CAPS capsule Take 1 capsule (290 mcg total) by mouth daily before breakfast. 90 capsule 3  . meloxicam (MOBIC) 15 MG tablet Take 1 tablet (15 mg total) by mouth daily. 90 tablet 3  . metoCLOPramide (REGLAN) 10 MG tablet Take 1 tablet (10 mg total) by mouth every 12 (twelve) hours as needed for nausea. 14 tablet 0  . mirtazapine (REMERON SOL-TAB) 15 MG disintegrating tablet DISSOLVE 1 TABLET(15 MG) ON THE TONGUE IN THE MORNING AND AT BEDTIME 60 tablet 2  . omeprazole (PRILOSEC) 40 MG capsule TAKE 1 CAPSULE TWICE DAILY 180 capsule 0  . ondansetron (ZOFRAN-ODT) 8 MG disintegrating tablet DISSOLVE 1 TABLET ON THE TONGUE EVERY 4 HOURS AS NEEDED FOR NAUSEA 8 tablet 0  . promethazine (PHENERGAN) 25 MG suppository Place 1 suppository (25 mg total) rectally every 6 (six) hours as needed for nausea or vomiting. 12 each 0  . QUEtiapine (SEROQUEL XR) 400 MG 24 hr tablet TAKE 3 TABLETS(1200 MG) BY MOUTH AT BEDTIME 90 tablet 2  . QUEtiapine (SEROQUEL) 400 MG tablet  Take 0.5 tablets (200 mg total) by mouth daily. At 8 pm 30 tablet 2  . tiZANidine (ZANAFLEX) 4 MG tablet TAKE 1 TABLET EVERY 8 HOURS AS NEEDED FOR MUSCLE SPASMS. 90 tablet 0   No current facility-administered medications on file prior to visit.    Family History  Problem Relation Age of Onset  . Breast cancer Maternal Aunt        x4  . Stomach cancer Maternal Aunt   . Prostate cancer Maternal Uncle        x2  . Breast cancer Maternal Grandmother   . Celiac disease Mother   . Breast cancer Mother 29  . Breast cancer Paternal Grandmother   . Colon cancer Neg Hx   . Esophageal cancer Neg Hx   . Rectal cancer Neg Hx     Social History   Socioeconomic History  . Marital status:  Single    Spouse name: Not on file  . Number of children: 1  . Years of education: Not on file  . Highest education level: Not on file  Occupational History  . Occupation: Disability  Tobacco Use  . Smoking status: Current Every Day Smoker    Packs/day: 0.50    Types: Cigarettes  . Smokeless tobacco: Never Used  . Tobacco comment: 10  CIG A DAY  Vaping Use  . Vaping Use: Never used  Substance and Sexual Activity  . Alcohol use: No    Alcohol/week: 0.0 standard drinks  . Drug use: No  . Sexual activity: Yes    Birth control/protection: Condom    Comment: both  Other Topics Concern  . Not on file  Social History Narrative  . Not on file   Social Determinants of Health   Financial Resource Strain: Not on file  Food Insecurity: Not on file  Transportation Needs: Not on file  Physical Activity: Not on file  Stress: Not on file  Social Connections: Not on file  Intimate Partner Violence: Not on file    Review of Systems: ROS negative except for what is noted on the assessment and plan.  Vitals:   12/26/20 1553  BP: 126/78  Pulse: 85  Temp: 98.3 F (36.8 C)  TempSrc: Oral  SpO2: 100%  Weight: 201 lb 6.4 oz (91.4 kg)  Height: 5\' 6"  (1.676 m)     Physical Exam: Physical Exam Constitutional:      Appearance: Normal appearance.  HENT:     Head: Normocephalic and atraumatic.  Eyes:     Extraocular Movements: Extraocular movements intact.  Cardiovascular:     Rate and Rhythm: Normal rate.     Pulses: Normal pulses.     Heart sounds: Normal heart sounds.  Pulmonary:     Effort: Pulmonary effort is normal.     Breath sounds: Normal breath sounds.  Abdominal:     General: Bowel sounds are normal.     Palpations: Abdomen is soft.     Tenderness: There is no abdominal tenderness.  Musculoskeletal:        General: Normal range of motion.     Cervical back: Normal range of motion.     Right lower leg: No edema.     Left lower leg: No edema.  Skin:     General: Skin is warm and dry.  Neurological:     Mental Status: She is alert and oriented to person, place, and time. Mental status is at baseline.  Psychiatric:        Mood  and Affect: Mood normal.      Assessment & Plan:   See Encounters Tab for problem based charting.  Patient discussed with Dr. Jeral Pinch, D.O. [facility], PGY-2 Pager: [phone], Phone: [phone] Date 12/27/2020 Time 4:59 PM

## 2020-12-26 NOTE — Telephone Encounter (Signed)
Patient has upcoming Appointment on  2/4 & informed that is the opportunity to have a discussion with you concerning this medication.

## 2020-12-26 NOTE — Telephone Encounter (Signed)
Patient called & stated gabapentin (NEURONTIN) 300 MG capsule was filled by her PCP Dr Dellia Cloud Until she was able to be seen with you. And that she no longer's sees Dr Marchia Bond.

## 2020-12-26 NOTE — Telephone Encounter (Signed)
She has an appointment scheduled with Dr. Marchia Bond today at 3:45 pm. Check her appointment encounters. She had rescheduled it from December. She needs to keep her appts with PCP.

## 2020-12-27 ENCOUNTER — Encounter: Payer: Self-pay | Admitting: Internal Medicine

## 2020-12-27 LAB — BMP8+ANION GAP
Anion Gap: 15 mmol/L (ref 10.0–18.0)
BUN/Creatinine Ratio: 14 (ref 9–23)
BUN: 10 mg/dL (ref 6–24)
CO2: 25 mmol/L (ref 20–29)
Calcium: 10 mg/dL (ref 8.7–10.2)
Chloride: 104 mmol/L (ref 96–106)
Creatinine, Ser: 0.73 mg/dL (ref 0.57–1.00)
GFR calc Af Amer: 111 mL/min/{1.73_m2} (ref >59)
GFR calc non Af Amer: 96 mL/min/{1.73_m2} (ref >59)
Glucose: 84 mg/dL (ref 65–99)
Potassium: 4.3 mmol/L (ref 3.5–5.2)
Sodium: 144 mmol/L (ref 134–144)

## 2020-12-27 LAB — MAGNESIUM: Magnesium: 2.1 mg/dL (ref 1.6–2.3)

## 2020-12-27 NOTE — Assessment & Plan Note (Addendum)
Patient has been on omeprazole for the better part of 8 years without improvement of her symptoms.  I counseled her on discontinuing this medication at this time and starting famotidine for intermittent episodes of GERD.  As I counseled her on food and medications that can worsen her gastroesophageal reflux.  Based on my interview, it sounds like the patient's symptoms are secondary to dyspepsia more so than true gastroesophageal reflux.  I will get the patient to follow up in a couple weeks to reassess her symptoms.  Plan: -Famotidine for intermittent GERD symptoms -Trial off of omeprazole.

## 2020-12-27 NOTE — Assessment & Plan Note (Addendum)
Patient presents today for follow-up for abdominal pain, nausea, vomiting.  Currently she has a diagnosis of irritable bowel syndrome with constipation.  She has been treated by Dr. Russella Dar over the last 7 to 8 years for this reason.  Patient initially presented to Dr. Russella Dar in 2014 for change in bowel habits with associated weight loss.  At that time she was having fluctuations in constipation and diarrhea that seemed to improve with eating less.  Patient also admits to having a recent course of Cipro/Flagyl at that time that improved her abdominal pain.  She was started on a bowel regimen for constipation and PPI and asked to follow back up later that year.  Patient presented for her follow-up with continuation of her symptoms.  Dr. Lawerance Cruel performed an EGD which did not show any significant pathology of note other than a small hiatal hernia.  Patient has subsequent appointment in 2015 for continuation of her abdominal pain and was started on dicyclomine prior to meals and at bedtime.  At her most recent appointment patient was switched to Linzess for constipation and her previous bowel regimen was discontinued.  She has remained on omeprazole 40 mg daily over the course of this time.  Recently, patient had a colonoscopy that showed a few polyps but otherwise was unremarkable for any intracolonic pathology.  On evaluation today patient continues to have significant abdominal pain, nausea, vomiting particularly in association with large meals.  Patient admits to having to eat smaller meals daily and use dicyclomine prior to eating to avoid abdominal pain and nausea.  She also has significant polypharmacy that is likely contributing to her symptoms.  I am concerned the patient may be suffering from gastroparesis which could be due to her polypharmacy.  Specifically she is taking medications such as quetiapine, hyoscyamine, dicyclomine, and amlodipine which all could potentially cause gastroparesis  symptoms.  Regardless, I think it is prudent that the patient undergo a gastric emptying study to further evaluate her chronic nausea, abdominal pain, and vomiting.  Plan: -Gastric emptying study -She will likely need to come off many of her medications and restart them slowly to see if there contributing to her symptoms.Marland Kitchen

## 2020-12-28 NOTE — Addendum Note (Signed)
Addended by: Chari Manning on: 12/28/2020 08:12 AM   Modules accepted: Orders

## 2021-01-01 ENCOUNTER — Other Ambulatory Visit: Payer: Medicare HMO

## 2021-01-01 DIAGNOSIS — Z20822 Contact with and (suspected) exposure to covid-19: Secondary | ICD-10-CM

## 2021-01-02 NOTE — Progress Notes (Addendum)
Internal Medicine Clinic Attending  Case discussed with Dr. Coe  At the time of the visit.  We reviewed the resident's history and exam and pertinent patient test results.  I agree with the assessment, diagnosis, and plan of care documented in the resident's note.  

## 2021-01-03 ENCOUNTER — Other Ambulatory Visit: Payer: Self-pay

## 2021-01-03 DIAGNOSIS — R232 Flushing: Secondary | ICD-10-CM

## 2021-01-03 MED ORDER — GABAPENTIN 300 MG PO CAPS: 300.0000 mg | ORAL_CAPSULE | Freq: Three times a day (TID) | ORAL | 0 refills | Status: DC

## 2021-01-03 NOTE — Telephone Encounter (Signed)
Pt states Dr. Wylene Simmer will not prescribe  gabapentin (NEURONTIN) 300 MG capsule because it was written by Dr. Marianna Payment. Pt would like to know can she continue to get this medicine. Please call pt back.

## 2021-01-09 ENCOUNTER — Encounter: Payer: Medicare HMO | Admitting: Student

## 2021-01-09 ENCOUNTER — Telehealth: Payer: Self-pay | Admitting: *Deleted

## 2021-01-09 NOTE — Telephone Encounter (Signed)
Message left on recorder for pt to call back to reschedule today's missed appt with [facility] (possibly due to bad weather).Despina Hidden Cassady1/18/20224:01 PM

## 2021-01-11 LAB — NOVEL CORONAVIRUS, NAA

## 2021-01-12 ENCOUNTER — Encounter: Payer: Medicare HMO | Admitting: Student

## 2021-01-12 ENCOUNTER — Other Ambulatory Visit: Payer: Medicare HMO

## 2021-01-12 ENCOUNTER — Other Ambulatory Visit: Payer: Self-pay

## 2021-01-12 DIAGNOSIS — Z20822 Contact with and (suspected) exposure to covid-19: Secondary | ICD-10-CM

## 2021-01-14 LAB — NOVEL CORONAVIRUS, NAA

## 2021-01-15 ENCOUNTER — Other Ambulatory Visit: Payer: Self-pay | Admitting: Internal Medicine

## 2021-01-15 ENCOUNTER — Telehealth: Payer: Self-pay | Admitting: *Deleted

## 2021-01-15 DIAGNOSIS — Z1231 Encounter for screening mammogram for malignant neoplasm of breast: Secondary | ICD-10-CM

## 2021-01-15 NOTE — Telephone Encounter (Signed)
I called the patient back regarding her URI symptoms and recent test results. She states that she was advised by the testing facility to schedule another test a different facility. She has an appointment tomorrow to be retested. She denies any intranasal medications that would interfere with the test. She states that he symptoms started 3 weeks ago and have improved during this time period, but does admit to some lingering URI symptoms. She has been able to manage her symptoms fine with OTC medications. I told her to call us back if her symptoms worsen. I reassured her that if her symptoms started 3 weeks ago and has had improvement, then she is likely not contagious. Regardless, I counseled her to continue wearing a mask and social distance when possible.

## 2021-01-15 NOTE — Telephone Encounter (Signed)
Patient called in stating she had a tele appt recently and was told she had a URI. She was advised to get a covid test. Her last 2 covid tests (1/10 and 01/12/2021) have come back with following comment:  We are unable to reliably determine a result for the specimen due to  the presence of PCR inhibitor(s) in the specimen submitted. If  clinically indicated, please recollect an additional specimen for  testing.   She wants to know what she should do next. She lives with elderly grandparents and does not want to endanger them. She still has cough, productive of light brown sputum (smokes 10 cigs/day x 30 years), nasal, and chest congestion. She has had " a little relief with expectorant."

## 2021-01-16 ENCOUNTER — Other Ambulatory Visit: Payer: Medicare HMO

## 2021-01-16 DIAGNOSIS — Z20822 Contact with and (suspected) exposure to covid-19: Secondary | ICD-10-CM

## 2021-01-19 ENCOUNTER — Ambulatory Visit (HOSPITAL_COMMUNITY): Payer: Medicare HMO

## 2021-01-19 ENCOUNTER — Telehealth: Payer: Self-pay | Admitting: Internal Medicine

## 2021-01-19 LAB — NOVEL CORONAVIRUS, NAA

## 2021-01-19 NOTE — Telephone Encounter (Signed)
RTC, VM obtained, [employer] compliant message left that nurse was returning her call. °SChaplin, RN,BSN ° °

## 2021-01-19 NOTE — Telephone Encounter (Signed)
Pt requesting a call back about her COVID test Results.

## 2021-01-23 ENCOUNTER — Other Ambulatory Visit: Payer: Self-pay | Admitting: Internal Medicine

## 2021-01-23 ENCOUNTER — Other Ambulatory Visit: Payer: Self-pay

## 2021-01-23 ENCOUNTER — Ambulatory Visit (INDEPENDENT_AMBULATORY_CARE_PROVIDER_SITE_OTHER): Payer: Medicare HMO | Admitting: Student

## 2021-01-23 DIAGNOSIS — R059 Cough, unspecified: Secondary | ICD-10-CM | POA: Diagnosis not present

## 2021-01-23 NOTE — Addendum Note (Signed)
Addended by: Andrew Au on: 01/23/2021 11:20 AM   Modules accepted: Level of Service

## 2021-01-23 NOTE — Telephone Encounter (Signed)
Patient called in with questions about inconclusive covid tests. She is very concerned. Tele appt made today with [facility] Team as there are no openings on [place]. Hubbard Hartshorn, BSN, RN-BC

## 2021-01-23 NOTE — Assessment & Plan Note (Signed)
Cough started 3 weeks ago with associated sore throat, itching rash, and congestion.  Has had Covid vaccine x2.  Denies fever, chills, decreased appetite, vomiting, abdominal pain, loss of taste or smell.  She has had 3 Covid PCR tests which all have the same inconclusive result: "unable to reliably determine a result for the specimen due to the presence of PCR inhibitor."  She was very concerned about what this might mean, and notes that she lives with her elderly grandmother.  Discussed with patient that this likely does not have any implication on her health, and that further testing would not change our management as she is likely noninfectious at this time.  Offered antigen test, but patient was satisfied with this explanation and declined.  Instructed to call the clinic back if she has worsening symptoms, fever, chills.  -Continue Robitussin and lozenges for symptomatic management

## 2021-01-23 NOTE — Progress Notes (Signed)
  [facility] Residency Telephone Encounter Continuity Care Appointment  HPI:   This telephone encounter was created for [name] on 01/23/2021 for the following purpose/cc: follow up on COVID testing.   Past Medical History:  Past Medical History:  Diagnosis Date  . Anemia   . Anxiety   . Anxiety and depression   . Asthma   . Bipolar 1 disorder ([place])   . Depression   . Excessive daytime sleepiness 11/18/2017  . GERD (gastroesophageal reflux disease)   . Heart murmur   . Hemorrhoids   . Hiatal hernia   . Hypertension   . Internal hemorrhoids   . Toe injury 11/18/2017      ROS:  Review of Systems  Constitutional: Negative for chills, fever and malaise/fatigue.  HENT: Positive for congestion and sore throat.   Eyes: Negative for blurred vision and double vision.  Respiratory: Positive for cough and sputum production (white foamy). Negative for hemoptysis and shortness of breath.   Cardiovascular: Negative for chest pain and palpitations.  Gastrointestinal: Positive for nausea (chronic from GERD). Negative for abdominal pain and vomiting.  Musculoskeletal: Negative for myalgias.  Skin: Positive for itching and rash.  Neurological: Negative for dizziness and loss of consciousness.  All other systems reviewed and are negative.      Assessment / Plan / Recommendations:   Please see A&P under problem oriented charting for assessment of the patient's acute and chronic medical conditions.   As always, pt is advised that if symptoms worsen or new symptoms arise, they should go to an urgent care facility or to to ER for further evaluation.   Consent and Medical Decision Making:   Patient discussed with Dr. Evette Doffing  This is a telephone encounter between Jasmine Jackson and Andrew Au on 01/23/2021 for cough and sore throat. The visit was conducted with the patient located at home and Andrew Au at [facility]. The patient's identity was confirmed using  their DOB and current address. The patient has consented to being evaluated through a telephone encounter and understands the associated risks (an examination cannot be done and the patient may need to come in for an appointment) / benefits (allows the patient to remain at home, decreasing exposure to coronavirus). I personally spent 15 minutes on medical discussion.

## 2021-01-24 ENCOUNTER — Other Ambulatory Visit: Payer: Self-pay | Admitting: Internal Medicine

## 2021-01-24 NOTE — Progress Notes (Signed)
Internal Medicine Clinic Attending  Case discussed with Dr. Chen  At the time of the visit.  We reviewed the resident's history and exam and pertinent patient test results.  I agree with the assessment, diagnosis, and plan of care documented in the resident's note. 

## 2021-01-24 NOTE — Addendum Note (Signed)
Addended by: Lalla Brothers T on: 01/24/2021 02:56 PM   Modules accepted: Level of Service

## 2021-01-26 ENCOUNTER — Other Ambulatory Visit (HOSPITAL_COMMUNITY): Payer: Self-pay | Admitting: Psychiatry

## 2021-01-26 ENCOUNTER — Encounter (HOSPITAL_COMMUNITY): Payer: Self-pay | Admitting: Psychiatry

## 2021-01-26 ENCOUNTER — Other Ambulatory Visit: Payer: Self-pay

## 2021-01-26 ENCOUNTER — Telehealth (INDEPENDENT_AMBULATORY_CARE_PROVIDER_SITE_OTHER): Payer: Medicare HMO | Admitting: Psychiatry

## 2021-01-26 ENCOUNTER — Telehealth (HOSPITAL_COMMUNITY): Payer: Self-pay | Admitting: *Deleted

## 2021-01-26 DIAGNOSIS — F3170 Bipolar disorder, currently in remission, most recent episode unspecified: Secondary | ICD-10-CM

## 2021-01-26 DIAGNOSIS — F419 Anxiety disorder, unspecified: Secondary | ICD-10-CM | POA: Diagnosis not present

## 2021-01-26 DIAGNOSIS — F410 Panic disorder [episodic paroxysmal anxiety] without agoraphobia: Secondary | ICD-10-CM | POA: Diagnosis not present

## 2021-01-26 DIAGNOSIS — R232 Flushing: Secondary | ICD-10-CM

## 2021-01-26 DIAGNOSIS — F4310 Post-traumatic stress disorder, unspecified: Secondary | ICD-10-CM

## 2021-01-26 MED ORDER — HYDROXYZINE HCL 50 MG PO TABS: 50.0000 mg | ORAL_TABLET | Freq: Three times a day (TID) | ORAL | 2 refills | Status: DC | PRN

## 2021-01-26 MED ORDER — QUETIAPINE FUMARATE 400 MG PO TABS: 200.0000 mg | ORAL_TABLET | Freq: Every day | ORAL | 2 refills | Status: DC

## 2021-01-26 MED ORDER — GABAPENTIN 300 MG PO CAPS: 300.0000 mg | ORAL_CAPSULE | Freq: Three times a day (TID) | ORAL | 2 refills | Status: DC

## 2021-01-26 MED ORDER — MIRTAZAPINE 15 MG PO TBDP: ORAL_TABLET | ORAL | 2 refills | Status: DC

## 2021-01-26 MED ORDER — LAMOTRIGINE 100 MG PO TABS: 100.0000 mg | ORAL_TABLET | Freq: Every day | ORAL | 2 refills | Status: DC

## 2021-01-26 MED ORDER — QUETIAPINE FUMARATE ER 400 MG PO TB24: 400.0000 mg | ORAL_TABLET | Freq: Every day | ORAL | 2 refills | Status: DC

## 2021-01-26 NOTE — Progress Notes (Signed)
[facility] Progress Note  Virtual Visit via Telephone Note  I connected with Jasmine Jackson on 01/26/21 at 11:00 AM EST by telephone and verified that I am speaking with the correct person using two identifiers.  Location: Patient: home Provider: Clinic   I discussed the limitations, risks, security and privacy concerns of performing an evaluation and management service by telephone and the availability of in person appointments. I also discussed with the patient that there may be a patient responsible charge related to this service. The patient expressed understanding and agreed to proceed.   I provided 18 minutes of non-face-to-face time during this encounter.    Patient Identification: HINLEY BRIMAGE MRN:  [id number] Date of Evaluation:  01/26/2021    Chief Complaint:  " I have been on the edge lately."   Visit Diagnosis:    ICD-10-CM   1. Bipolar disorder in full remission, most recent episode unspecified type ([place])  F31.70   2. Anxiety  F41.9   3. Panic attacks  F41.0   4. PTSD (post-traumatic stress disorder)  F43.10     History of Present Illness: Patient reported that she has been on the edge lately.  She stated that she was having cough for the past few weeks.  She underwent Covid testing on 3 separate days in the month of January however interestingly all 3 of them came inconclusive. Patient stated that she has been really worried about that and has been feeling quite anxious due to that. She stated that she has an elderly grandmother who is 87 and also an older uncle who is 76 years old.  She stated that because of her cough a few weeks ago she was wearing a mask and she will when going near them. She reported that she saw her PCP a few days ago and since they prescribed her some medicines for cough has improved significantly. She stated that she is not feeling weak or experiencing any fatigue.  She denied any other symptoms suggestive of a URI.  Writer provided the  patient with reassurance that since she was not having any active symptoms that suggested any infection she should not worry about it excessively.  Writer informed her of other Covid testing sites available in [place] area. Patient stated that she was thankful for the reassurance and felt better after hearing this from the writer.  She informed that her son was 73 years old has autism spectrum disorder and he has been contemplating about signing up for records at [facility].  She stated that however due to his nature he likes to research things and he has told her recently that he does not believe he is ready to go to [facility].  He is also worried about the pandemic. Writer recommended to the patient that if her son does not believe he is ready then she should not push him excessively regarding going to [facility] for now.  Patient verbalized her understanding.  She requested refills for all her medications and also requested refills for gabapentin.  Past Psychiatric History: Bipolar disorder, anxiety, PTSD and panic attacks  Previous Psychotropic Medications: Yes   Substance Abuse History in the last 12 months:  No.  Consequences of Substance Abuse: NA  Past Medical History:  Past Medical History:  Diagnosis Date  . Anemia   . Anxiety   . Anxiety and depression   . Asthma   . Bipolar 1 disorder ([place])   . Depression   . Excessive daytime  sleepiness 11/18/2017  . GERD (gastroesophageal reflux disease)   . Heart murmur   . Hemorrhoids   . Hiatal hernia   . Hypertension   . Internal hemorrhoids   . Toe injury 11/18/2017    Past Surgical History:  Procedure Laterality Date  . ABDOMINAL HYSTERECTOMY     fibroids  . HEMORRHOID SURGERY    . INGUINAL HERNIA REPAIR    . TONSILLECTOMY    . TUBAL LIGATION      Family Psychiatric History: anxiety- mom, son has autism  Family History:  Family History  Problem Relation Age of Onset  . Breast cancer Maternal Aunt        x4  . Stomach  cancer Maternal Aunt   . Prostate cancer Maternal Uncle        x2  . Breast cancer Maternal Grandmother   . Celiac disease Mother   . Breast cancer Mother 65  . Breast cancer Paternal Grandmother   . Colon cancer Neg Hx   . Esophageal cancer Neg Hx   . Rectal cancer Neg Hx     Social History:   Social History   Socioeconomic History  . Marital status: Single    Spouse name: Not on file  . Number of children: 1  . Years of education: Not on file  . Highest education level: Not on file  Occupational History  . Occupation: Disability  Tobacco Use  . Smoking status: Current Every Day Smoker    Packs/day: 0.50    Types: Cigarettes  . Smokeless tobacco: Never Used  . Tobacco comment: 10  CIG A DAY  Vaping Use  . Vaping Use: Never used  Substance and Sexual Activity  . Alcohol use: No    Alcohol/week: 0.0 standard drinks  . Drug use: No  . Sexual activity: Yes    Birth control/protection: Condom    Comment: both  Other Topics Concern  . Not on file  Social History Narrative  . Not on file   Social Determinants of Health   Financial Resource Strain: Not on file  Food Insecurity: Not on file  Transportation Needs: Not on file  Physical Activity: Not on file  Stress: Not on file  Social Connections: Not on file    Additional Social History: Lives with 59 year old son, is on disability.  Allergies:   Allergies  Allergen Reactions  . Asa [Aspirin] Other (See Comments)    Heart flutters  . Banana Itching    Metabolic Disorder Labs: Lab Results  Component Value Date   HGBA1C 5.4 11/16/2019   No results found for: PROLACTIN Lab Results  Component Value Date   CHOL 159 04/24/2015   TRIG 149.0 04/24/2015   HDL 40.30 04/24/2015   CHOLHDL 4 04/24/2015   VLDL 29.8 04/24/2015   LDLCALC 89 04/24/2015   Lab Results  Component Value Date   TSH 0.771 07/31/2020    Therapeutic Level Labs: No results found for: LITHIUM No results found for: CBMZ No results  found for: VALPROATE  Current Medications: Current Outpatient Medications  Medication Sig Dispense Refill  . albuterol (VENTOLIN HFA) 108 (90 Base) MCG/ACT inhaler INHALE 2 PUFFS INTO THE LUNGS EVERY 6 (SIX) HOURS AS NEEDED FOR WHEEZING OR SHORTNESS OF BREATH. 3 each 0  . amLODipine (NORVASC) 5 MG tablet TAKE 1 TABLET(5 MG) BY MOUTH DAILY 90 tablet 1  . antiseptic oral rinse (BIOTENE) LIQD 15 mLs by Mouth Rinse route as needed for dry mouth. 237 mL 0  . diclofenac  Sodium (VOLTAREN) 1 % GEL APPLY 2 GRAMS TOPICALLY AS NEEDED 100 g 2  . dicyclomine (BENTYL) 20 MG tablet TAKE 1/2 TABLET (10 MG TOTAL) BY MOUTH 4 (FOUR) TIMES DAILY BEFORE MEALS AND AT BEDTIME. 180 tablet 1  . famotidine (PEPCID) 40 MG tablet Take 1 tablet (40 mg total) by mouth at bedtime. 30 tablet 11  . gabapentin (NEURONTIN) 300 MG capsule Take 1 capsule (300 mg total) by mouth 3 (three) times daily. 90 capsule 0  . guaiFENesin-dextromethorphan (ROBITUSSIN DM) 100-10 MG/5ML syrup Take 5 mLs by mouth every 4 (four) hours as needed for cough. 118 mL 0  . hydrochlorothiazide (HYDRODIURIL) 25 MG tablet TAKE 1 TABLET(25 MG) BY MOUTH DAILY 90 tablet 1  . hydrocortisone (ANUSOL-HC) 25 MG suppository Place 1 suppository (25 mg total) rectally daily. 30 suppository 0  . hydrOXYzine (ATARAX/VISTARIL) 50 MG tablet TAKE 1 TABLET(50 MG) BY MOUTH THREE TIMES DAILY AS NEEDED FOR ANXIETY 90 tablet 2  . hydrOXYzine (VISTARIL) 50 MG capsule Take 1 capsule (50 mg total) by mouth 3 (three) times daily as needed. 90 capsule 0  . lamoTRIgine (LAMICTAL) 100 MG tablet TAKE 1 TABLET(100 MG) BY MOUTH DAILY 30 tablet 2  . linaclotide (LINZESS) 290 MCG CAPS capsule Take 1 capsule (290 mcg total) by mouth daily before breakfast. 90 capsule 3  . meloxicam (MOBIC) 15 MG tablet Take 1 tablet (15 mg total) by mouth daily. 90 tablet 3  . metoCLOPramide (REGLAN) 10 MG tablet Take 1 tablet (10 mg total) by mouth every 12 (twelve) hours as needed for nausea. 14 tablet  0  . mirtazapine (REMERON SOL-TAB) 15 MG disintegrating tablet DISSOLVE 1 TABLET(15 MG) ON THE TONGUE IN THE MORNING AND AT BEDTIME 60 tablet 2  . omeprazole (PRILOSEC) 40 MG capsule TAKE 1 CAPSULE TWICE DAILY 180 capsule 0  . ondansetron (ZOFRAN-ODT) 8 MG disintegrating tablet DISSOLVE 1 TABLET ON THE TONGUE EVERY 4 HOURS AS NEEDED FOR NAUSEA 8 tablet 0  . promethazine (PHENERGAN) 25 MG suppository Place 1 suppository (25 mg total) rectally every 6 (six) hours as needed for nausea or vomiting. 12 each 0  . QUEtiapine (SEROQUEL XR) 400 MG 24 hr tablet TAKE 3 TABLETS(1200 MG) BY MOUTH AT BEDTIME 90 tablet 2  . QUEtiapine (SEROQUEL) 400 MG tablet Take 0.5 tablets (200 mg total) by mouth daily. At 8 pm 30 tablet 2  . tiZANidine (ZANAFLEX) 4 MG tablet TAKE 1 TABLET EVERY 8 HOURS AS NEEDED FOR MUSCLE SPASMS. 90 tablet 0   No current facility-administered medications for this visit.      Psychiatric Specialty Exam: Review of Systems  There were no vitals taken for this visit.There is no height or weight on file to calculate BMI.  General Appearance: Unable to assess due to phone visit  Eye Contact:  Unable to assess due to phone visit  Speech:  Clear and Coherent and Normal Rate  Volume:  Normal  Mood:  Anxious  Affect:  Congruent  Thought Process:  Goal Directed and Descriptions of Associations: Intact  Orientation:  Full (Time, Place, and Person)  Thought Content:  Logical  Suicidal Thoughts:  No  Homicidal Thoughts:  No  Memory:  Immediate;   Good Recent;   Good  Judgement:  Good  Insight:  Good  Psychomotor Activity:  Normal  Concentration:  Concentration: Good and Attention Span: Good  Recall:  Good  Fund of Knowledge:Good  Language: Good  Akathisia:  Negative  Handed:  Right  AIMS (if indicated):  Done; 0  Assets:  Communication Skills Desire for Improvement Financial Resources/Insurance Housing  ADL's:  Intact  Cognition: WNL  Sleep:  Fair   Screenings: PHQ2-9    [facility] Office Visit from 12/26/2020 in [facility] Office Visit from 09/20/2020 in [facility] Office Visit from 07/31/2020 in [facility] Office Visit from 03/16/2020 in [facility] Office Visit from 02/08/2020 in [facility]  PHQ-2 Total Score 0 0 0 1 2  PHQ-9 Total Score 0 0 6 5 10       Assessment and Plan: Patient complained of feeling anxious due to inconclusive Covid tests in the month of January.  She stated that she was worrying if she had Covid but could not get any conclusive results.  Patient was provided with reassurance, patient reported that she was feeling better after talking to the writer.   1. Bipolar disorder in full remission, most recent episode unspecified type ([place])  - gabapentin (NEURONTIN) 300 MG capsule; Take 1 capsule (300 mg total) by mouth 3 (three) times daily.  Dispense: 90 capsule; Refill: 2 - mirtazapine (REMERON SOL-TAB) 15 MG disintegrating tablet; DISSOLVE 1 TABLET(15 MG) ON THE TONGUE IN THE MORNING AND AT BEDTIME  Dispense: 60 tablet; Refill: 2 - lamoTRIgine (LAMICTAL) 100 MG tablet; Take 1 tablet (100 mg total) by mouth daily.  Dispense: 30 tablet; Refill: 2 - QUEtiapine (SEROQUEL) 400 MG tablet; Take 0.5 tablets (200 mg total) by mouth daily. At 8 pm  Dispense: 30 tablet; Refill: 2 - QUEtiapine (SEROQUEL XR) 400 MG 24 hr tablet; Take 1 tablet (400 mg total) by mouth at bedtime.  Dispense: 30 tablet; Refill: 2  2. Anxiety  - hydrOXYzine (ATARAX/VISTARIL) 50 MG tablet; Take 1 tablet (50 mg total) by mouth 3 (three) times daily as needed.  Dispense: 90 tablet; Refill: 2 - gabapentin (NEURONTIN) 300 MG capsule; Take 1 capsule (300 mg total) by mouth 3 (three) times daily.  Dispense: 90 capsule; Refill: 2  3. Panic attacks  - hydrOXYzine (ATARAX/VISTARIL) 50 MG tablet; Take 1 tablet (50 mg total) by mouth 3 (three) times daily as needed.   Dispense: 90 tablet; Refill: 2 - gabapentin (NEURONTIN) 300 MG capsule; Take 1 capsule (300 mg total) by mouth 3 (three) times daily.  Dispense: 90 capsule; Refill: 2 - mirtazapine (REMERON SOL-TAB) 15 MG disintegrating tablet; DISSOLVE 1 TABLET(15 MG) ON THE TONGUE IN THE MORNING AND AT BEDTIME  Dispense: 60 tablet; Refill: 2  4. PTSD (post-traumatic stress disorder)  - mirtazapine (REMERON SOL-TAB) 15 MG disintegrating tablet; DISSOLVE 1 TABLET(15 MG) ON THE TONGUE IN THE MORNING AND AT BEDTIME  Dispense: 60 tablet; Refill: 2  Continue same medication regimen. Follow up in 3 months.   [place] Crane, MD 2/4/202211:32 AM

## 2021-01-26 NOTE — Telephone Encounter (Signed)
Obtained prior authorization for patients Quetiapine ER 400 mg and it is good until 12/22/21.

## 2021-01-29 ENCOUNTER — Telehealth (HOSPITAL_COMMUNITY): Payer: Self-pay | Admitting: *Deleted

## 2021-01-29 NOTE — Telephone Encounter (Signed)
PATIENT CALLED STATED THAT HER RECENT Rx SENT BY DR Toy Care IS FOR   gabapentin (NEURONTIN) 300 MG capsule 90 capsule 2 01/26/2021    Sig - Route: Take 1 capsule (300 mg total) by mouth 3 (three) times daily. - Oral    AND PREVIOUSLY DR SHARPE HAD HER TAKING  3 CAPSULES  3 X DAILY. PATIENT THOUGHT SHE WAS TO CONTINUE WHAT SHE HAS BEEN PREVIOUSLY TAKING.

## 2021-01-30 ENCOUNTER — Telehealth (HOSPITAL_COMMUNITY): Payer: Self-pay | Admitting: *Deleted

## 2021-01-30 NOTE — Telephone Encounter (Signed)
3 capsules of 300 mg TID is equal to 2700 mg/day which is higher than recommended highest dose. She had confirmed during the visit that she takes 300 mg TID. My advice is we should continue that current dose for now and if we need to increase the dose we can discuss this in the future.

## 2021-01-30 NOTE — Telephone Encounter (Signed)
[facility] REVIEW  APPROVED  mirtazapine (REMERON SOL-TAB) 15 MG disintegrating tablet # 60 tablet/30 DAYS   AUTHORIZATION GOOD UNTIL 12/22/2021

## 2021-02-27 ENCOUNTER — Other Ambulatory Visit: Payer: Self-pay | Admitting: Internal Medicine

## 2021-02-28 ENCOUNTER — Ambulatory Visit: Payer: Medicare HMO

## 2021-02-28 ENCOUNTER — Ambulatory Visit
Admission: RE | Admit: 2021-02-28 | Discharge: 2021-02-28 | Disposition: A | Discharge status: Home / Self Care | Payer: Medicare HMO | Source: Ambulatory Visit | Attending: Internal Medicine | Admitting: Internal Medicine

## 2021-02-28 ENCOUNTER — Other Ambulatory Visit: Payer: Self-pay

## 2021-02-28 DIAGNOSIS — Z1231 Encounter for screening mammogram for malignant neoplasm of breast: Secondary | ICD-10-CM

## 2021-03-01 ENCOUNTER — Ambulatory Visit (INDEPENDENT_AMBULATORY_CARE_PROVIDER_SITE_OTHER): Payer: Medicare HMO | Admitting: Internal Medicine

## 2021-03-01 ENCOUNTER — Telehealth (HOSPITAL_COMMUNITY): Payer: Self-pay | Admitting: *Deleted

## 2021-03-01 ENCOUNTER — Other Ambulatory Visit: Payer: Self-pay | Admitting: Internal Medicine

## 2021-03-01 DIAGNOSIS — F17200 Nicotine dependence, unspecified, uncomplicated: Secondary | ICD-10-CM | POA: Diagnosis not present

## 2021-03-01 DIAGNOSIS — F3170 Bipolar disorder, currently in remission, most recent episode unspecified: Secondary | ICD-10-CM

## 2021-03-01 DIAGNOSIS — R232 Flushing: Secondary | ICD-10-CM

## 2021-03-01 MED ORDER — BUPROPION HCL ER (SR) 150 MG PO TB12
ORAL_TABLET | ORAL | 2 refills | Status: DC
Start: 2021-03-01 — End: 2021-06-19

## 2021-03-01 MED ORDER — QUETIAPINE FUMARATE ER 400 MG PO TB24: ORAL_TABLET | ORAL | 2 refills | Status: DC

## 2021-03-01 MED ORDER — NICOTINE 10 MG IN INHA: 1.0000 | RESPIRATORY_TRACT | 0 refills | Status: DC | PRN

## 2021-03-01 NOTE — Addendum Note (Signed)
Addended by: [place] Crane on: 03/01/2021 10:28 AM   Modules accepted: Orders

## 2021-03-01 NOTE — Telephone Encounter (Signed)
Rx for Seroquel XR 400 mg 3 tablets at bedtime sent to her pharmacy

## 2021-03-01 NOTE — Assessment & Plan Note (Addendum)
She has been having hot flashes for several years but has avoided estrogen medications due to family history of breath cancer. She also is a current smoker. But the hot flashes have gotten worse recently and she will have to make a plan because she will get so hot when they occur. The hot flashes last about five minutes. She will have sweating with them. No chest pain, racing heart, shortness of breath or dizziness. She is taking mirtazapine 15 mg bid, seroquel xr 1200 qhs and seroquel 200 mg at 8pm, lamotrigine 100 mg qd, and gabapentin 300 mg tid.  She takes these for anxiety, depression, PTSD, and bipolar disorder. She has no history of falls with her current medications. Her gabapentin was recently decreased from 900 mg tid to 300 mg tid.   - discussed possibly increasing gabapentin back to 600 as it sounds like her hot flashes worsened after this was decreased. Also discussed that I worry about increasing this with all the centrally acting medications she is on. She will discuss with Dr. Toy Care as she has been prescribing this medication and recently began weaning down.

## 2021-03-01 NOTE — Assessment & Plan Note (Signed)
She also is wanting to stop smoking cigarettes. She has stopped in the past with the help of Mannie Stabile. They thought Chantix would not be a good idea with her psychiatric history and tried wellbutrin which worked well for her and she stopped smoking for a while. She also used the nicotrol inhaler which helped. Discussed with her psychiatrist, and they do not think this will interfere with her current medications.   - wellbutrin 150 mg qd for three days then 150 mg bid  - start nicotrol inhaler

## 2021-03-01 NOTE — Progress Notes (Signed)
   CC: smoking cessation  This is a telephone encounter between [name] and Marty Heck on 03/01/2021 for smoking cessation. The visit was conducted with the patient located at home and Marty Heck at [facility]. The patient's identity was confirmed using their DOB and current address. The patient has consented to being evaluated through a telephone encounter and understands the associated risks (an examination cannot be done and the patient may need to come in for an appointment) / benefits (allows the patient to remain at home, decreasing exposure to coronavirus). I personally spent 19 minutes on medical discussion.   HPI:  Ms.Rori R Culliton is a 50 y.o. with PMH as below.   Please see A&P for assessment of the patient's acute and chronic medical conditions.   She has been having hot flashes for several years but has avoided estrogen medications due to family history of breath cancer. She also is a current smoker. But the hot flashes have gotten worse recently and she will have to make a plan because she will get so hot when they occur. The hot flashes last about five minutes. She will have sweating with them. No chest pain, racing heart, shortness of breath or dizziness. She is taking mirtazapine 15 mg bid, seroquel xr 1200 qhs and seroquel 200 mg at 8pm, lamotrigine 100 mg qd, and gabapentin 300 mg tid.  She takes these for anxiety, depression, PTSD, and bipolar disorder. She has no history of falls with her current medications. Her gabapentin was recently decreased from 900 mg tid to 300 mg tid.   She also is wanting to stop smoking cigarettes. She has stopped in the past with the help of Mannie Stabile. They thought Chantix would not be a good idea with her psychiatric history and tried wellbutrin which worked well for her and she stopped smoking for a while. She also used the nicotrol inhaler which helped. Discussed with her psychiatrist, and they do not think this will interfere with her  current medications.   Past Medical History:  Diagnosis Date  . Anemia   . Anxiety   . Anxiety and depression   . Asthma   . Bipolar 1 disorder ([place])   . Depression   . Excessive daytime sleepiness 11/18/2017  . GERD (gastroesophageal reflux disease)   . Heart murmur   . Hemorrhoids   . Hiatal hernia   . Hypertension   . Internal hemorrhoids   . Toe injury 11/18/2017   Review of Systems:   ROS negative except as noted in HPI    Assessment & Plan:   See Encounters Tab for problem based charting.  Patient discussed with Dr. Dareen Piano

## 2021-03-01 NOTE — Telephone Encounter (Signed)
Patient calling to clarify her Seroquel XR sig, states her bottle says 1200 mg at HS and our chart says 400 mg of XR. Note from last month which is when she was last seen hx indicates 1200 but the summary of action taken on appt date is 400 Q HS. Will ask Dr to please clarify. I also called the pharmacy and they filled it as 400, 30 pills

## 2021-03-01 NOTE — Telephone Encounter (Signed)
Called her back to clarify her Seroquel XR is to be 3 pills totaling 1200 mg at HS along with her immediate release of 200 mg. New rx called to her preferred pharmacy. She was tearful during phone call when I called her back to clarify and apologize for our error, states she is relieved.

## 2021-03-06 ENCOUNTER — Telehealth: Payer: Self-pay

## 2021-03-06 NOTE — Telephone Encounter (Signed)
Please call pt back about nicotine gum/patch.

## 2021-03-06 NOTE — Telephone Encounter (Signed)
Informed pt her insurance would not cover nicotrol; trying to get nasal spray approved per Dr Sharon Seller. Pt would like a call when done.

## 2021-03-06 NOTE — Telephone Encounter (Signed)
Return pt's call - stated the last time when she was trying to quit smoking, the doctor ordered Wellbutrin and something else. After looking at pt's current med list, Nicotine inh was also ordered; pt stated when she went to the pharmacy it was not there. Stated she will call the pharmacy and if there's a problem, she will call us back.

## 2021-03-06 NOTE — Telephone Encounter (Signed)
Her insurance would not cover the nicotrol so Regino Schultze and I are trying to get the nicotine nasal spray approved.

## 2021-03-07 ENCOUNTER — Telehealth: Payer: Self-pay | Admitting: *Deleted

## 2021-03-07 NOTE — Telephone Encounter (Signed)
Prescription was faxed and called to [facility] on [address].

## 2021-03-07 NOTE — Telephone Encounter (Signed)
Information was called and faxed to [facility] for Nicotrol Inhaler.  Sander Nephew, RN 03/06/2021 9:15 AM.

## 2021-03-08 ENCOUNTER — Telehealth: Payer: Self-pay

## 2021-03-08 NOTE — Telephone Encounter (Signed)
Per patient request, mailed disability placard.

## 2021-03-15 ENCOUNTER — Telehealth: Payer: Self-pay

## 2021-03-15 NOTE — Telephone Encounter (Signed)
Return pt's call - she wants know if Dr Sharon Seller was able to get in touch with Dr Toy Care, Behavior health; if so, she would like to know what was decided.

## 2021-03-15 NOTE — Telephone Encounter (Signed)
Pt is requesting a call back. 

## 2021-03-16 ENCOUNTER — Other Ambulatory Visit: Payer: Self-pay | Admitting: Internal Medicine

## 2021-03-16 ENCOUNTER — Other Ambulatory Visit: Payer: Self-pay | Admitting: Gastroenterology

## 2021-03-16 DIAGNOSIS — R112 Nausea with vomiting, unspecified: Secondary | ICD-10-CM

## 2021-03-16 NOTE — Telephone Encounter (Signed)
Attempted to call back and left message for her to return call. I sent in the wellbutrin for her. As for her gabapentin, this was decreased by Dr. Toy Care, she will need to talk with her about increasing again.

## 2021-03-19 ENCOUNTER — Telehealth: Payer: Self-pay

## 2021-03-19 NOTE — Progress Notes (Signed)
Internal Medicine Clinic Attending  Case discussed with Dr. Seawell  At the time of the visit.  We reviewed the resident's history and exam and pertinent patient test results.  I agree with the assessment, diagnosis, and plan of care documented in the resident's note.  

## 2021-03-19 NOTE — Telephone Encounter (Signed)
Per patient nicotine (NICOTROL) 10 MG inhaler is not working. Please call pt back.

## 2021-03-20 NOTE — Telephone Encounter (Signed)
If she would like an alternative medication, can we schedule her a telehealth appointment to discuss the various options?

## 2021-03-27 ENCOUNTER — Ambulatory Visit: Payer: Medicare HMO

## 2021-04-03 ENCOUNTER — Other Ambulatory Visit (HOSPITAL_COMMUNITY): Payer: Self-pay | Admitting: Psychiatry

## 2021-04-03 DIAGNOSIS — F419 Anxiety disorder, unspecified: Secondary | ICD-10-CM

## 2021-04-03 DIAGNOSIS — F410 Panic disorder [episodic paroxysmal anxiety] without agoraphobia: Secondary | ICD-10-CM

## 2021-04-10 ENCOUNTER — Encounter: Payer: Self-pay | Admitting: *Deleted

## 2021-04-10 NOTE — Progress Notes (Unsigned)

## 2021-04-11 ENCOUNTER — Other Ambulatory Visit: Payer: Self-pay

## 2021-04-11 ENCOUNTER — Ambulatory Visit (INDEPENDENT_AMBULATORY_CARE_PROVIDER_SITE_OTHER): Payer: Medicare HMO | Admitting: Internal Medicine

## 2021-04-11 DIAGNOSIS — R634 Abnormal weight loss: Secondary | ICD-10-CM

## 2021-04-11 NOTE — Progress Notes (Addendum)
I connected with  Jasmine Jackson on 04/11/21 by telephone and verified that I am speaking with the correct person using two identifiers.   I discussed the limitations of evaluation and management by telemedicine. The patient expressed understanding and agreed to proceed.      CC: weight loss  This is a telephone encounter between Jasmine Jackson and Jasmine Jackson on 04/11/2021 for weight loss. The visit was conducted with the patient located at home and Jasmine Jackson at [facility]. The patient's identity was confirmed using their DOB and current address. The patient has consented to being evaluated through a telephone encounter and understands the associated risks (an examination cannot be done and the patient may need to come in for an appointment) / benefits (allows the patient to remain at home, decreasing exposure to coronavirus). I personally spent 9 minutes on medical discussion.   HPI:  Jasmine Jackson is a 50 y.o. with PMH as below.   Please see A&P for assessment of the patient's acute and chronic medical conditions.   Past Medical History:  Diagnosis Date  . Anemia   . Anxiety   . Anxiety and depression   . Asthma   . Bipolar 1 disorder ([place])   . Depression   . Excessive daytime sleepiness 11/18/2017  . GERD (gastroesophageal reflux disease)   . Heart murmur   . Hemorrhoids   . Hiatal hernia   . Hypertension   . Internal hemorrhoids   . Toe injury 11/18/2017   Review of Systems: See problem based assessment and plan    Assessment & Plan:   See Encounters Tab for problem based charting.  Patient discussed with Dr. Jimmye Norman

## 2021-04-13 ENCOUNTER — Encounter: Payer: Self-pay | Admitting: Internal Medicine

## 2021-04-13 NOTE — Assessment & Plan Note (Signed)
Patient admits to a 10 lb weight loss in roughly the last month. She states that she has had a decreased in appetite during this time. When asked if any big changes has happened in her life, she states that 3 young children started living with her starting a month ago. She states that her second cousin had 10 children and all but 3 of them were taken by child protective services.  The remaining 3 now living at her house.  She was unprepared for this and has caused a significant amount of stress in her life.  I counseled her regarding effects of this new change in her life and how this could affect her weight loss.  I discussed the importance of finding safe housing for the children in order to prioritize the children safety and patient's health.  I recommended that the patient call to protective services to start this process.  Patient admits understanding and agrees.  She denies any red flag signs for weight loss. I counseled her regarding warning signs of weight loss and told her that we will need to follow-up with her in the near future to watch her weight closely.  She states that she will be years of daily and make an immediate follow-up if she loses any additional weight.  Plan: -Monitor closely

## 2021-04-16 ENCOUNTER — Encounter: Payer: Self-pay | Admitting: Student

## 2021-04-16 NOTE — Progress Notes (Signed)
Internal Medicine Clinic Attending  Case discussed with Dr. Coe  At the time of the visit.  We reviewed the resident's history and pertinent patient test results.  I agree with the assessment, diagnosis, and plan of care documented in the resident's note.  

## 2021-04-16 NOTE — Progress Notes (Unsigned)
Things That May Be Affecting Your Health:  Alcohol  Hearing loss  Pain   X Depression  Home Safety  Sexual Health   Diabetes  Lack of physical activity  Stress   Difficulty with daily activities  Loneliness  Tiredness   Drug use  Medicines X Tobacco use   Falls  Motor Vehicle Safety X Weight   Food choices  Oral Health  Other    YOUR PERSONALIZED HEALTH PLAN : 1. Schedule your next subsequent Medicare Wellness visit in one year 2. Attend all of your regular appointments to address your medical issues 3. Complete the preventative screenings and services   Annual Wellness Visit   Medicare Covered Preventative Screenings and [place] Men and Women Who How Often Need? Date of Last Service Action  Abdominal Aortic Aneurysm Adults with AAA risk factors Once      Alcohol Misuse and Counseling All Adults Screening once a year if no alcohol misuse. Counseling up to 4 face to face sessions.     Bone Density Measurement  Adults at risk for osteoporosis Once every 2 yrs      Lipid Panel Z13.6 All adults without CV disease Once every 5 yrs YES  04/2015 and it was normal at that time    Colorectal Cancer   Stool sample or  Colonoscopy All adults 13 and older   Once every year  Every 10 years        Depression All Adults Once a year YES Today   Diabetes Screening Blood glucose, post glucose load, or GTT Z13.1  All adults at risk  Pre-diabetics  Once per year  Twice per year      Diabetes  Self-Management Training All adults Diabetics 10 hrs first year; 2 hours subsequent years. Requires Copay     Glaucoma  Diabetics  Family history of glaucoma  African Americans 31 yrs +  Hispanic Americans 65 yrs + Annually - requires coppay      Hepatitis C Z72.89 or F19.20  High Risk for HCV  Born between 1945 and 1965  Annually  Once YES Never done    HIV Z11.4 All adults based on risk  Annually btw ages 25 & 35 regardless of risk  Annually > 65  yrs if at increased risk      Lung Cancer Screening Asymptomatic adults aged 69-77 with 30 pack yr history and current smoker OR quit within the last 15 yrs Annually Must have counseling and shared decision making documentation before first screen      Medical Nutrition Therapy Adults with   Diabetes  Renal disease  Kidney transplant within past 3 yrs 3 hours first year; 2 hours subsequent years     Obesity and Counseling All adults Screening once a year Counseling if BMI 30 or higher YES Today   Tobacco Use Counseling Adults who use tobacco  Up to 8 visits in one year YES 2017 per chart   Vaccines Z23  Hepatitis B  Influenza   Pneumonia  Adults   Once  Once every flu season  Two different vaccines separated by one year     Next Annual Wellness Visit People with Medicare Every year  Today     Services & Screenings Women Who How Often Need  Date of Last Service Action  Mammogram  Z12.31 Women over 102 One baseline ages 39-39. Annually ager 40 yrs+      Pap tests All women Annually if high risk. Every 2  yrs for normal risk women      Screening for cervical cancer with   Pap (Z01.419 nl or Z01.411abnl) &  HPV Z11.51 Women aged 22 to 42 Once every 5 yrs     Screening pelvic and breast exams All women Annually if high risk. Every 2 yrs for normal risk women     Sexually Transmitted Diseases  Chlamydia  Gonorrhea  Syphilis All at risk adults Annually for non pregnant females at increased risk         [place] Men Who How Ofter Need  Date of Last Service Action  Prostate Cancer - DRE & PSA Men over 50 Annually.  DRE might require a copay.        Sexually Transmitted Diseases  Syphilis All at risk adults Annually for men at increased risk      Health Maintenance List Health Maintenance  Topic Date Due  . Hepatitis C Screening  Never done  . COVID-19 Vaccine (3 - Booster for Pfizer series) 10/27/2020  . INFLUENZA VACCINE  07/23/2021  .  MAMMOGRAM  02/28/2022  . TETANUS/TDAP  01/10/2026  . COLONOSCOPY (Pts 45-81yrs Insurance coverage will need to be confirmed)  08/11/2030  . HIV Screening  Completed  . HPV VACCINES  Aged Out  . PAP SMEAR-Modifier  Discontinued

## 2021-04-17 ENCOUNTER — Encounter: Payer: Medicare HMO | Admitting: Gastroenterology

## 2021-04-18 ENCOUNTER — Telehealth (INDEPENDENT_AMBULATORY_CARE_PROVIDER_SITE_OTHER): Payer: Medicare HMO | Admitting: Psychiatry

## 2021-04-18 ENCOUNTER — Encounter (HOSPITAL_COMMUNITY): Payer: Self-pay | Admitting: Psychiatry

## 2021-04-18 ENCOUNTER — Other Ambulatory Visit: Payer: Self-pay

## 2021-04-18 DIAGNOSIS — F4310 Post-traumatic stress disorder, unspecified: Secondary | ICD-10-CM | POA: Diagnosis not present

## 2021-04-18 DIAGNOSIS — F3170 Bipolar disorder, currently in remission, most recent episode unspecified: Secondary | ICD-10-CM | POA: Insufficient documentation

## 2021-04-18 DIAGNOSIS — F419 Anxiety disorder, unspecified: Secondary | ICD-10-CM

## 2021-04-18 DIAGNOSIS — F410 Panic disorder [episodic paroxysmal anxiety] without agoraphobia: Secondary | ICD-10-CM

## 2021-04-18 MED ORDER — QUETIAPINE FUMARATE 400 MG PO TABS: 400.0000 mg | ORAL_TABLET | Freq: Every day | ORAL | 2 refills | Status: DC

## 2021-04-18 MED ORDER — LAMOTRIGINE 100 MG PO TABS: 100.0000 mg | ORAL_TABLET | Freq: Every day | ORAL | 2 refills | Status: DC

## 2021-04-18 MED ORDER — QUETIAPINE FUMARATE ER 400 MG PO TB24
ORAL_TABLET | ORAL | 2 refills | Status: DC
Start: 2021-04-18 — End: 2021-07-16

## 2021-04-18 MED ORDER — MIRTAZAPINE 15 MG PO TBDP: ORAL_TABLET | ORAL | 2 refills | Status: DC

## 2021-04-18 MED ORDER — HYDROXYZINE HCL 50 MG PO TABS: 50.0000 mg | ORAL_TABLET | Freq: Three times a day (TID) | ORAL | 2 refills | Status: DC | PRN

## 2021-04-18 MED ORDER — GABAPENTIN 400 MG PO CAPS: 400.0000 mg | ORAL_CAPSULE | Freq: Three times a day (TID) | ORAL | 2 refills | Status: DC

## 2021-04-18 NOTE — Progress Notes (Signed)
[facility] Progress Note  Virtual Visit via Telephone Note  I connected with Jasmine Jackson on 04/18/21 at  3:40 PM EDT by telephone and verified that I am speaking with the correct person using two identifiers.  Location: Patient: home Provider: Clinic   I discussed the limitations, risks, security and privacy concerns of performing an evaluation and management service by telephone and the availability of in person appointments. I also discussed with the patient that there may be a patient responsible charge related to this service. The patient expressed understanding and agreed to proceed.   I provided 15 minutes of non-face-to-face time during this encounter.      Patient Identification: Jasmine Jackson MRN:  [id number] Date of Evaluation:  04/18/2021    Chief Complaint:  " I am doing good."  Visit Diagnosis:    ICD-10-CM   1. Bipolar disorder in full remission, most recent episode unspecified type ([place])  F31.70 gabapentin (NEURONTIN) 400 MG capsule    lamoTRIgine (LAMICTAL) 100 MG tablet    mirtazapine (REMERON SOL-TAB) 15 MG disintegrating tablet    QUEtiapine (SEROQUEL) 400 MG tablet    QUEtiapine (SEROQUEL XR) 400 MG 24 hr tablet  2. PTSD (post-traumatic stress disorder)  F43.10 mirtazapine (REMERON SOL-TAB) 15 MG disintegrating tablet  3. Panic attacks  F41.0 hydrOXYzine (ATARAX/VISTARIL) 50 MG tablet    mirtazapine (REMERON SOL-TAB) 15 MG disintegrating tablet  4. Anxiety  F41.9 gabapentin (NEURONTIN) 400 MG capsule    hydrOXYzine (ATARAX/VISTARIL) 50 MG tablet    History of Present Illness: Patient reported she is doing well.  She felt that her mood has been pretty stable.  She stated that she had spoken with her PCP regarding her hot flashes and her PCP had recommended going up on the dose of gabapentin.  She stated that she will discuss this with the writer first.  She asked the writer if her dose of gabapentin can be adjusted to help with hot flashes. Writer informed  her that her dose can be increased to 400 mg 3 times daily to see if that would help. She denied any other issues or concerns today. Regarding her son, she said that her son is not enrolling for [facility] [employer] now because he is worried about the COVID cases going back up again.    Past Psychiatric History: Bipolar disorder, anxiety, PTSD and panic attacks  Previous Psychotropic Medications: Yes   Substance Abuse History in the last 12 months:  No.  Consequences of Substance Abuse: NA  Past Medical History:  Past Medical History:  Diagnosis Date  . Anemia   . Anxiety   . Anxiety and depression   . Asthma   . Bipolar 1 disorder ([place])   . Depression   . Excessive daytime sleepiness 11/18/2017  . GERD (gastroesophageal reflux disease)   . Heart murmur   . Hemorrhoids   . Hiatal hernia   . Hypertension   . Internal hemorrhoids   . Toe injury 11/18/2017    Past Surgical History:  Procedure Laterality Date  . ABDOMINAL HYSTERECTOMY     fibroids  . HEMORRHOID SURGERY    . INGUINAL HERNIA REPAIR    . TONSILLECTOMY    . TUBAL LIGATION      Family Psychiatric History: anxiety- mom, son has autism  Family History:  Family History  Problem Relation Age of Onset  . Breast cancer Maternal Aunt        x4  . Stomach cancer Maternal Aunt   .  Prostate cancer Maternal Uncle        x2  . Breast cancer Maternal Grandmother   . Celiac disease Mother   . Breast cancer Mother 79  . Breast cancer Paternal Grandmother   . Colon cancer Neg Hx   . Esophageal cancer Neg Hx   . Rectal cancer Neg Hx     Social History:   Social History   Socioeconomic History  . Marital status: Single    Spouse name: Not on file  . Number of children: 1  . Years of education: Not on file  . Highest education level: Not on file  Occupational History  . Occupation: Disability  Tobacco Use  . Smoking status: Current Every Day Smoker    Packs/day: 0.50    Types: Cigarettes  . Smokeless tobacco: Never  Used  . Tobacco comment: 10  CIG A DAY  Vaping Use  . Vaping Use: Never used  Substance and Sexual Activity  . Alcohol use: No    Alcohol/week: 0.0 standard drinks  . Drug use: No  . Sexual activity: Yes    Birth control/protection: Condom    Comment: both  Other Topics Concern  . Not on file  Social History Narrative  . Not on file   Social Determinants of Health   Financial Resource Strain: Not on file  Food Insecurity: Not on file  Transportation Needs: Not on file  Physical Activity: Not on file  Stress: Not on file  Social Connections: Not on file    Additional Social History: Lives with 30 year old son, is on disability.  Allergies:   Allergies  Allergen Reactions  . Asa [Aspirin] Other (See Comments)    Heart flutters  . Banana Itching    Metabolic Disorder Labs: Lab Results  Component Value Date   HGBA1C 5.4 11/16/2019   No results found for: PROLACTIN Lab Results  Component Value Date   CHOL 159 04/24/2015   TRIG 149.0 04/24/2015   HDL 40.30 04/24/2015   CHOLHDL 4 04/24/2015   VLDL 29.8 04/24/2015   LDLCALC 89 04/24/2015   Lab Results  Component Value Date   TSH 0.771 07/31/2020    Therapeutic Level Labs: No results found for: LITHIUM No results found for: CBMZ No results found for: VALPROATE  Current Medications: Current Outpatient Medications  Medication Sig Dispense Refill  . gabapentin (NEURONTIN) 400 MG capsule Take 1 capsule (400 mg total) by mouth 3 (three) times daily. 90 capsule 2  . albuterol (VENTOLIN HFA) 108 (90 Base) MCG/ACT inhaler INHALE 2 PUFFS INTO THE LUNGS EVERY 6 (SIX) HOURS AS NEEDED FOR WHEEZING OR SHORTNESS OF BREATH. 3 each 0  . amLODipine (NORVASC) 5 MG tablet TAKE 1 TABLET(5 MG) BY MOUTH DAILY 90 tablet 1  . antiseptic oral rinse (BIOTENE) LIQD 15 mLs by Mouth Rinse route as needed for dry mouth. 237 mL 0  . buPROPion (WELLBUTRIN SR) 150 MG 12 hr tablet Take 1 tablet (150 mg total) by mouth daily for 3 days, THEN  1 tablet (150 mg total) 2 (two) times daily for 3 days. 60 tablet 2  . diclofenac Sodium (VOLTAREN) 1 % GEL APPLY 2 GRAMS TOPICALLY AS NEEDED 100 g 2  . dicyclomine (BENTYL) 20 MG tablet TAKE 1/2 TABLET (10 MG TOTAL) BY MOUTH 4 (FOUR) TIMES DAILY BEFORE MEALS AND AT BEDTIME. 180 tablet 1  . famotidine (PEPCID) 40 MG tablet TAKE 1 TABLET(40 MG) BY MOUTH AT BEDTIME 30 tablet 3  . guaiFENesin-dextromethorphan (ROBITUSSIN DM) 100-10 MG/5ML  syrup Take 5 mLs by mouth every 4 (four) hours as needed for cough. 118 mL 0  . hydrochlorothiazide (HYDRODIURIL) 25 MG tablet TAKE 1 TABLET(25 MG) BY MOUTH DAILY 90 tablet 1  . hydrocortisone (ANUSOL-HC) 25 MG suppository Place 1 suppository (25 mg total) rectally daily. 30 suppository 0  . hydrOXYzine (ATARAX/VISTARIL) 50 MG tablet Take 1 tablet (50 mg total) by mouth 3 (three) times daily as needed. 90 tablet 2  . hydrOXYzine (VISTARIL) 50 MG capsule Take 1 capsule (50 mg total) by mouth 3 (three) times daily as needed. 90 capsule 0  . lamoTRIgine (LAMICTAL) 100 MG tablet Take 1 tablet (100 mg total) by mouth daily. 30 tablet 2  . linaclotide (LINZESS) 290 MCG CAPS capsule Take 1 capsule (290 mcg total) by mouth daily before breakfast. 90 capsule 3  . meloxicam (MOBIC) 15 MG tablet Take 1 tablet (15 mg total) by mouth daily. 90 tablet 3  . metoCLOPramide (REGLAN) 10 MG tablet Take 1 tablet (10 mg total) by mouth every 12 (twelve) hours as needed for nausea. 14 tablet 0  . mirtazapine (REMERON SOL-TAB) 15 MG disintegrating tablet DISSOLVE 1 TABLET(15 MG) ON THE TONGUE IN THE MORNING AND AT BEDTIME 60 tablet 2  . nicotine (NICOTROL) 10 MG inhaler Inhale 1 Cartridge (1 continuous puffing total) into the lungs as needed for smoking cessation. 42 each 0  . omeprazole (PRILOSEC) 40 MG capsule TAKE 1 CAPSULE TWICE DAILY 180 capsule 0  . ondansetron (ZOFRAN-ODT) 8 MG disintegrating tablet DISSOLVE 1 TABLET ON THE TONGUE EVERY 4 HOURS AS NEEDED FOR NAUSEA 8 tablet 0  .  QUEtiapine (SEROQUEL XR) 400 MG 24 hr tablet Take 3 tablets at bedtime (1200 mg) 90 tablet 2  . QUEtiapine (SEROQUEL) 400 MG tablet Take 1 tablet (400 mg total) by mouth at bedtime. 30 tablet 2  . tiZANidine (ZANAFLEX) 4 MG tablet TAKE 1 TABLET EVERY 8 HOURS AS NEEDED FOR MUSCLE SPASMS. 90 tablet 0   No current facility-administered medications for this visit.      Psychiatric Specialty Exam: Review of Systems  There were no vitals taken for this visit.There is no height or weight on file to calculate BMI.  General Appearance: Unable to assess due to phone visit  Eye Contact:  Unable to assess due to phone visit  Speech:  Clear and Coherent and Normal Rate  Volume:  Normal  Mood:  Anxious  Affect:  Congruent  Thought Process:  Goal Directed and Descriptions of Associations: Intact  Orientation:  Full (Time, Place, and Person)  Thought Content:  Logical  Suicidal Thoughts:  No  Homicidal Thoughts:  No  Memory:  Immediate;   Good Recent;   Good  Judgement:  Good  Insight:  Good  Psychomotor Activity:  Normal  Concentration:  Concentration: Good and Attention Span: Good  Recall:  Good  Fund of Knowledge:Good  Language: Good  Akathisia:  Negative  Handed:  Right  AIMS (if indicated):  Done; 0  Assets:  Communication Skills Desire for Improvement Financial Resources/Insurance Housing  ADL's:  Intact  Cognition: WNL  Sleep:  Fair   Screenings: PHQ2-9   [facility] Office Visit from 04/11/2021 in [facility] Office Visit from 12/26/2020 in [facility] Office Visit from 09/20/2020 in [facility] Office Visit from 07/31/2020 in [facility] Office Visit from 03/16/2020 in [facility]  PHQ-2 Total Score 0 0 0 0 1  PHQ-9 Total Score -- 0 0 6 5      Assessment and Plan: Patient seems to be doing fairly well overall.  She requested increasing her dose of gabapentin  to help with her hot flashes as recommended by her PCP.  1. Bipolar disorder in full remission, most recent episode unspecified type ([place])  -Increase gabapentin (NEURONTIN) 400 MG capsule; Take 1 capsule (400 mg total) by mouth 3 (three) times daily.  Dispense: 90 capsule; Refill: 2 - lamoTRIgine (LAMICTAL) 100 MG tablet; Take 1 tablet (100 mg total) by mouth daily.  Dispense: 30 tablet; Refill: 2 - mirtazapine (REMERON SOL-TAB) 15 MG disintegrating tablet; DISSOLVE 1 TABLET(15 MG) ON THE TONGUE IN THE MORNING AND AT BEDTIME  Dispense: 60 tablet; Refill: 2 - QUEtiapine (SEROQUEL) 400 MG tablet; Take 1 tablet (400 mg total) by mouth at bedtime.  Dispense: 30 tablet; Refill: 2 - QUEtiapine (SEROQUEL XR) 400 MG 24 hr tablet; Take 3 tablets at bedtime (1200 mg)  Dispense: 90 tablet; Refill: 2  2. PTSD (post-traumatic stress disorder)  - mirtazapine (REMERON SOL-TAB) 15 MG disintegrating tablet; DISSOLVE 1 TABLET(15 MG) ON THE TONGUE IN THE MORNING AND AT BEDTIME  Dispense: 60 tablet; Refill: 2  3. Panic attacks  - hydrOXYzine (ATARAX/VISTARIL) 50 MG tablet; Take 1 tablet (50 mg total) by mouth 3 (three) times daily as needed.  Dispense: 90 tablet; Refill: 2 - mirtazapine (REMERON SOL-TAB) 15 MG disintegrating tablet; DISSOLVE 1 TABLET(15 MG) ON THE TONGUE IN THE MORNING AND AT BEDTIME  Dispense: 60 tablet; Refill: 2  4. Anxiety  - gabapentin (NEURONTIN) 400 MG capsule; Take 1 capsule (400 mg total) by mouth 3 (three) times daily.  Dispense: 90 capsule; Refill: 2 - hydrOXYzine (ATARAX/VISTARIL) 50 MG tablet; Take 1 tablet (50 mg total) by mouth 3 (three) times daily as needed.  Dispense: 90 tablet; Refill: 2   Continue same regimen except for increase gabapentin to 400 mg 3 times daily. Follow up in 3 months. Patient was informed that her care is being transferred to a different provider as the writer is leaving this clinic.  Patient verbalized her understanding.  [place] Crane,  MD 4/27/20223:58 PM

## 2021-04-20 ENCOUNTER — Telehealth (HOSPITAL_COMMUNITY): Payer: Medicare HMO | Admitting: Psychiatry

## 2021-05-04 ENCOUNTER — Telehealth (HOSPITAL_COMMUNITY): Payer: Self-pay

## 2021-05-04 ENCOUNTER — Telehealth (HOSPITAL_COMMUNITY): Payer: Self-pay | Admitting: Psychiatry

## 2021-05-04 NOTE — Telephone Encounter (Signed)
Can you please call her and ask her what is bothering her? If it is urgent, then give her information of our walk-in hours and she can be seen urgently.

## 2021-05-04 NOTE — Telephone Encounter (Signed)
Patient called and stated that she's sad. She stated that [facility] [place] [facility] [employer] [facility]. She stated that when graduation came, she felt let down and they didn't get to have a party. Her words were "It took 12 years to get to graduation and 12 minutes for the ceremony." She stated that she's not over all of it and it makes her cry. Please review and advise. Thank you

## 2021-05-04 NOTE — Telephone Encounter (Signed)
CALLER:  Patient   REASON:  Discuss with provider "something has been bothering me"   RETURN PHONE#  [phone] 

## 2021-05-04 NOTE — Telephone Encounter (Signed)
Please convey to her that I can understand what she feels as a mother. Suggest to her that may be she can plan a small party for her son at home with close family members.  Recommend continuing same regimen for now. She has been offered to be connected to a therapist but she declined.  Ask her if she would like to speak to a therapist. If yes, connect her to front desk for appointment scheduling.

## 2021-05-04 NOTE — Telephone Encounter (Signed)
CALLER:  Patient   REASON:  Discuss with provider "something has been bothering me"   RETURN PHONE#  [phone]

## 2021-05-08 ENCOUNTER — Other Ambulatory Visit: Payer: Self-pay | Admitting: Internal Medicine

## 2021-05-08 DIAGNOSIS — R112 Nausea with vomiting, unspecified: Secondary | ICD-10-CM

## 2021-05-09 NOTE — Telephone Encounter (Signed)
Pt is calling regarding medicine pls contact [phone]

## 2021-05-18 ENCOUNTER — Other Ambulatory Visit: Payer: Self-pay | Admitting: Internal Medicine

## 2021-05-24 ENCOUNTER — Encounter: Payer: Self-pay | Admitting: Internal Medicine

## 2021-05-29 ENCOUNTER — Ambulatory Visit (INDEPENDENT_AMBULATORY_CARE_PROVIDER_SITE_OTHER): Payer: Medicare HMO | Admitting: Physician Assistant

## 2021-05-29 ENCOUNTER — Encounter: Payer: Self-pay | Admitting: Physician Assistant

## 2021-05-29 ENCOUNTER — Other Ambulatory Visit (INDEPENDENT_AMBULATORY_CARE_PROVIDER_SITE_OTHER): Payer: Medicare HMO

## 2021-05-29 VITALS — BP 122/80 | HR 88 | Ht 66.0 in | Wt 185.2 lb

## 2021-05-29 DIAGNOSIS — R634 Abnormal weight loss: Secondary | ICD-10-CM

## 2021-05-29 DIAGNOSIS — R112 Nausea with vomiting, unspecified: Secondary | ICD-10-CM

## 2021-05-29 DIAGNOSIS — R1084 Generalized abdominal pain: Secondary | ICD-10-CM | POA: Diagnosis not present

## 2021-05-29 DIAGNOSIS — R140 Abdominal distension (gaseous): Secondary | ICD-10-CM

## 2021-05-29 LAB — CBC WITH DIFFERENTIAL/PLATELET
Basophils Absolute: 0.1 10*3/uL (ref 0.0–0.1)
Basophils Relative: 1.5 % (ref 0.0–3.0)
Eosinophils Absolute: 0.1 10*3/uL (ref 0.0–0.7)
Eosinophils Relative: 0.9 % (ref 0.0–5.0)
HCT: 40.4 % (ref 36.0–46.0)
Hemoglobin: 13.8 g/dL (ref 12.0–15.0)
Lymphocytes Relative: 56.1 % — ABNORMAL HIGH (ref 12.0–46.0)
Lymphs Abs: 3.1 10*3/uL (ref 0.7–4.0)
MCHC: 34 g/dL (ref 30.0–36.0)
MCV: 83.5 fl (ref 78.0–100.0)
Monocytes Absolute: 0.3 10*3/uL (ref 0.1–1.0)
Monocytes Relative: 6.1 % (ref 3.0–12.0)
Neutro Abs: 2 10*3/uL (ref 1.4–7.7)
Neutrophils Relative %: 35.4 % — ABNORMAL LOW (ref 43.0–77.0)
Platelets: 338 10*3/uL (ref 150.0–400.0)
RBC: 4.84 Mil/uL (ref 3.87–5.11)
RDW: 14.1 % (ref 11.5–15.5)
WBC: 5.6 10*3/uL (ref 4.0–10.5)

## 2021-05-29 LAB — TSH: TSH: 1.06 u[IU]/mL (ref 0.35–4.50)

## 2021-05-29 LAB — SEDIMENTATION RATE: Sed Rate: 47 mm/hr — ABNORMAL HIGH (ref 0–30)

## 2021-05-29 MED ORDER — FAMOTIDINE 40 MG PO TABS: 40.0000 mg | ORAL_TABLET | Freq: Two times a day (BID) | ORAL | 2 refills | Status: DC

## 2021-05-29 MED ORDER — PROMETHAZINE HCL 25 MG RE SUPP: 25.0000 mg | Freq: Four times a day (QID) | RECTAL | 2 refills | Status: DC | PRN

## 2021-05-29 NOTE — Progress Notes (Signed)
Reviewed and agree with management plan.  [place] T. Rockwell Zentz, MD FACG [phone]  

## 2021-05-29 NOTE — Progress Notes (Signed)
Subjective:    Patient ID: Jasmine Jackson, female    DOB: [birth year], 50 y.o.   MRN: [id number]  HPI Jasmine Jackson is a pleasant 50 year old African-American female, established with Dr. Fuller Plan who was last seen here in August 2021 when she underwent colonoscopy.  She was found to have 5 sessile polyps all measuring 6 to 7 mm, 1 small AVM was noted in the ascending colon, few diverticuli in the left colon and grade 3 internal hemorrhoids.  Path on the polyps consistent with hyperplastic polyps. She does have history of IBS-C, GERD, bipolar disorder, PTSD and gout.  She says that her current symptoms started about 4 months ago when she developed intermittent episodes of fairly diffuse abdominal pain, swelling and nausea and vomiting.  She says she will have vomiting for 4 to 5 days in a row and at times she vomits so much that she gets sore from vomiting.  She says the emesis has been very malodorous and smells like stool at times.  Symptoms will gradually subside for for 5 days and then recur. She has not had any hematemesis, bowel movements have been fairly normal except when she is not eating and she has not noticed any melena or hematochezia.  She does take Linzess and feels that this helps. No associated fever or chills. She has lost over 20 pounds since onset of symptoms.  Appetite has been decreased. She had had some increased stress in her life over the past several months as she has 3 small children living with her, in addition to her son.  She says she has reached out to family and is getting a lot of help with care of these children and does not feel that stress is what is precipitating her current symptoms. 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 He has not had any recent imaging or labs. She is status post hysterectomy, and inguinal hernia repair.  Review of Systems  Pertinent positive and negative review of systems were noted in the above HPI section.  All other review of systems was otherwise negative.  Outpatient Encounter Medications as of 05/29/2021  Medication Sig  . albuterol (VENTOLIN HFA) 108 (90 Base) MCG/ACT inhaler INHALE 2 PUFFS INTO THE LUNGS EVERY 6 (SIX) HOURS AS NEEDED FOR WHEEZING OR SHORTNESS OF BREATH.  Marland Kitchen amLODipine (NORVASC) 5 MG tablet TAKE 1 TABLET(5 MG) BY MOUTH DAILY  . antiseptic oral rinse (BIOTENE) LIQD 15 mLs by Mouth Rinse route as needed for dry mouth.  Marland Kitchen buPROPion (WELLBUTRIN SR) 150 MG 12 hr tablet Take 1 tablet (150 mg total) by mouth daily for 3 days, THEN 1 tablet (150 mg total) 2 (two) times daily for 3 days.  . diclofenac Sodium (VOLTAREN) 1 % GEL APPLY 2 GRAMS TOPICALLY AS NEEDED  . dicyclomine (BENTYL) 20 MG tablet TAKE 1/2 TABLET (10 MG TOTAL) BY MOUTH 4 (FOUR) TIMES DAILY BEFORE MEALS AND AT BEDTIME.  . famotidine (PEPCID) 40 MG tablet TAKE 1 TABLET(40 MG) BY MOUTH AT BEDTIME  . gabapentin (NEURONTIN) 400 MG capsule Take 1 capsule (400 mg total) by mouth 3 (three) times daily.  . hydrochlorothiazide (HYDRODIURIL) 25 MG tablet TAKE 1 TABLET(25 MG) BY MOUTH DAILY  . hydrOXYzine (ATARAX/VISTARIL) 50 MG tablet Take 1 tablet (50 mg total) by mouth 3 (three) times daily as needed.  . hydrOXYzine (VISTARIL) 50 MG  capsule Take 1 capsule (50 mg total) by mouth 3 (three) times daily as needed.  . lamoTRIgine (LAMICTAL) 100 MG tablet Take 1 tablet (100 mg total) by mouth daily.  Marland Kitchen linaclotide (LINZESS) 290 MCG CAPS capsule Take 1 capsule (290 mcg total) by mouth daily before breakfast.  . meloxicam (MOBIC) 15 MG tablet Take 1 tablet (15 mg total) by mouth daily.  . metoCLOPramide (REGLAN) 10 MG tablet Take 1 tablet (10 mg total) by mouth every 12 (twelve) hours as needed for nausea.  . mirtazapine (REMERON SOL-TAB) 15 MG disintegrating tablet DISSOLVE 1 TABLET(15 MG) ON THE TONGUE IN THE MORNING AND AT BEDTIME  . nicotine  (NICOTROL) 10 MG inhaler Inhale 1 Cartridge (1 continuous puffing total) into the lungs as needed for smoking cessation.  Marland Kitchen NICOTROL NS 10 MG/ML SOLN USE 1 SPRAY BY NASAL ROUTE AS NEEDED FOR NICOTINE CRAVINGS  . omeprazole (PRILOSEC) 40 MG capsule TAKE 1 CAPSULE TWICE DAILY  . ondansetron (ZOFRAN-ODT) 8 MG disintegrating tablet DISSOLVE 1 TABLET ON THE TONGUE EVERY 4 HOURS AS NEEDED FOR NAUSEA  . QUEtiapine (SEROQUEL XR) 400 MG 24 hr tablet Take 3 tablets at bedtime (1200 mg)  . QUEtiapine (SEROQUEL) 400 MG tablet Take 1 tablet (400 mg total) by mouth at bedtime.  Marland Kitchen tiZANidine (ZANAFLEX) 4 MG tablet TAKE 1 TABLET EVERY 8 HOURS AS NEEDED FOR MUSCLE SPASMS.  . [DISCONTINUED] guaiFENesin-dextromethorphan (ROBITUSSIN DM) 100-10 MG/5ML syrup Take 5 mLs by mouth every 4 (four) hours as needed for cough.  . [DISCONTINUED] hydrocortisone (ANUSOL-HC) 25 MG suppository Place 1 suppository (25 mg total) rectally daily.   No facility-administered encounter medications on file as of 05/29/2021.   Allergies  Allergen Reactions  . Asa [Aspirin] Other (See Comments)    Heart flutters  . Banana Itching   Patient Active Problem List   Diagnosis Date Noted  . Bipolar disorder in full remission ([place]) 04/18/2021  . Cough 01/23/2021  . Anxiety 09/21/2020  . PTSD (post-traumatic stress disorder) 09/21/2020  . Weight loss 07/31/2020  . Acanthosis nigricans 11/16/2019  . Nocturnal leg cramps 10/18/2018  . Migraine 11/18/2017  . Hot flashes 11/27/2016  . Tobacco use  10/18/2016  . Panic attacks 09/09/2016  . Health care maintenance 01/11/2016  . Low back pain 12/02/2015  . Hypertension 04/24/2015  . Muscle spasm 04/24/2015  . Bipolar disorder ([place]) 04/16/2014  . History of gout 04/01/2014  . Grade III internal hemorrhoids 12/08/2013  . Irritable bowel syndrome 12/08/2013  . Dyspepsia 12/08/2013   Social History   Socioeconomic History  . Marital status: Single    Spouse name: Not on file  . Number  of children: 1  . Years of education: Not on file  . Highest education level: Not on file  Occupational History  . Occupation: Disability  Tobacco Use  . Smoking status: Current Every Day Smoker    Packs/day: 0.50    Types: Cigarettes  . Smokeless tobacco: Never Used  . Tobacco comment: 10  CIG A DAY  Vaping Use  . Vaping Use: Never used  Substance and Sexual Activity  . Alcohol use: No    Alcohol/week: 0.0 standard drinks  . Drug use: No  . Sexual activity: Yes    Birth control/protection: Condom    Comment: both  Other Topics Concern  . Not on file  Social History Narrative  . Not on file   Social Determinants of Health   Financial Resource Strain: Not on file  Food Insecurity: Not on  file  Transportation Needs: Not on file  Physical Activity: Not on file  Stress: Not on file  Social Connections: Not on file  Intimate Partner Violence: Not on file    Jasmine Jackson's family history includes Breast cancer in her maternal aunt, maternal grandmother, and paternal grandmother; Breast cancer (age of onset: 74) in her mother; Celiac disease in her mother; Prostate cancer in her maternal uncle; Stomach cancer in her maternal aunt.      Objective:      Physical Exam Well-developed well-nourished female/female in no acute distress.  Height, OCAREQ,148 BMI29  HEENT; nontraumatic normocephalic, EOMI, PE R LA, sclera anicteric. Oropharynx; not examined today Neck; supple, no JVD Cardiovascular; regular rate and rhythm with S1-S2, no murmur rub or gallop Pulmonary; Clear bilaterally Abdomen; soft, bowel sounds are active, no succussion splash, there is some tenderness in the right mid and periumbilical area and left mid abdomen, no palpable mass or hepatosplenomegaly, bowel sounds are active Rectal; not done Skin; benign exam, no jaundice rash or appreciable lesions Extremities; no clubbing cyanosis or edema skin warm and dry Neuro/Psych; alert and oriented x4, grossly nonfocal  mood and affect appropriate       Assessment & Plan:   #41 50 year old African-American female with 56-monthhistory of intermittent episodes of abdominal pain, distention nausea and vomiting which may last for 4 to 5 days, subside for several days and then recur.  She has had repeated episodes of emesis and complains of foul emesis. Associated 20 pound plus weight loss  Etiology of current symptoms is not clear, will need to rule out intermittent partial obstruction, occult neoplasm, gallbladder disease, versus other intra-abdominal inflammatory process  #2 GERD #3 colon cancer surveillance-up-to-date with colonoscopy done August 2021 with removal of 5 hyperplastic polyps #4 diverticulosis #5 large internal hemorrhoids #6 history of IBS-C  Plan; CBC with differential, c-Met, sed rate, TSH Patient will be scheduled for CT scan of the abdomen pelvis with IV contrast. Start Phenergan suppositories 25 mg every 6 hours as needed for nausea vomiting. Increase famotidine to 40 mg p.o. twice daily, new prescription sent We discussed pushing fluids, and clear to full liquid diet during the days that she has been having problems with nausea and vomiting.  Suggested that she add some small cyst next on the days that she is able to eat and continue trying to eat 3 meals per day. We discussed addition of Ensure or boost 1 or 2 daily. Further recommendations pending findings of labs and CT.  If unrevealing will need EGD with Dr. SFuller Plan  Matyas Baisley SGenia HaroldPA-C 05/29/2021   Cc: CMarianna Payment MD

## 2021-05-29 NOTE — Patient Instructions (Addendum)
If you are age 51 or younger, your body mass index should be between 19-25. Your Body mass index is 29.89 kg/m. If this is out of the aformentioned range listed, please consider follow up with your Primary Care Provider.  __________________________________________________________  The [place] [facility] providers would like to encourage you to use [facility] to communicate with providers for non-urgent requests or questions.  Due to long hold times on the telephone, sending your provider a message by [facility] may be a faster and more efficient way to get a response.  Please allow 48 business hours for a response.  Please remember that this is for non-urgent requests.   CT SCAN:  . You have been scheduled for a CT of the abdomen and pelvis at [facility] on 06/12/2021 at 3:20 pm. Please arrive @ 3:00 pm for registration.   PREP:   . Do not eat anything after 10:20 am (4 hours prior to your test)  . Drink 1 bottle of contrast @ 1:20 pm (2 hours prior to your exam)  . Drink 1 bottle of contrast @ 2:20 pm (1 hour prior to your exam)   CONTRAST:  . You will need to stop by [facility] to pick up 2 bottles of contrast 3 DAYS BEFORE YOUR EXAM. . The solution may taste better if refrigerated. . DO NOT add ice or dilute the contrast.  . Shake well before drinking.   MEDICATIONS:  . You may take your medications as prescribed with a small amount of water, if necessary.  . The following medications MAY need to be held 48 hours before your exam: METFORMIN, GLUCOPHAGE, [place], AVANDAMET, RIOMET, FORTAMET, [place] MET, JANUMET, GLUMETZA or METAGLIP.  Marland Kitchen  NEED TO RESCHEDULE?   Marland Kitchen Please call radiology at [phone].  WHY ARE YOU HAVING THIS EXAM?  . The purpose of you drinking the oral contrast is to aid in the visualization of your intestinal tract. The contrast solution may cause some diarrhea. Depending on your individual set of symptoms, you may also receive an intravenous injection of  x-ray contrast/dye. Plan on being at [place] for 45 minutes or longer, depending on the type of exam you are having performed.   Your provider has requested that you go to the basement level for lab work before leaving today. Press "B" on the elevator. The lab is located at the first door on the left as you exit the elevator.  Increase your Famotidine 40 mg to 1 tablet twice daily START Promethazine 25 mg suppositories, 1 suppository rectally every 6 hours as need for nausea.  Follow up pending the results of your labs and CT  Thank you for entrusting me with your care and choosing [facility].  Amy Esterwood, PA-C

## 2021-05-30 LAB — COMPREHENSIVE METABOLIC PANEL
ALT: 18 U/L (ref 0–35)
AST: 23 U/L (ref 0–37)
Albumin: 4.9 g/dL (ref 3.5–5.2)
Alkaline Phosphatase: 82 U/L (ref 39–117)
BUN: 13 mg/dL (ref 6–23)
CO2: 26 mEq/L (ref 19–32)
Calcium: 10.2 mg/dL (ref 8.4–10.5)
Chloride: 99 mEq/L (ref 96–112)
Creatinine, Ser: 0.77 mg/dL (ref 0.40–1.20)
GFR: 89.64 mL/min (ref >60.00)
Glucose, Bld: 86 mg/dL (ref 70–99)
Potassium: 3.4 mEq/L — ABNORMAL LOW (ref 3.5–5.1)
Sodium: 141 mEq/L (ref 135–145)
Total Bilirubin: 0.3 mg/dL (ref 0.2–1.2)
Total Protein: 8.4 g/dL — ABNORMAL HIGH (ref 6.0–8.3)

## 2021-05-31 ENCOUNTER — Other Ambulatory Visit: Payer: Self-pay | Admitting: Internal Medicine

## 2021-06-07 ENCOUNTER — Telehealth: Payer: Self-pay

## 2021-06-07 NOTE — Telephone Encounter (Signed)
Called pt to notified her disability parking parking placard is ready to be pick up at the front desk. Pt did not answer the phone, LVM for pt to call back.

## 2021-06-08 ENCOUNTER — Telehealth: Payer: Self-pay | Admitting: Physician Assistant

## 2021-06-08 NOTE — Telephone Encounter (Signed)
I contacted the pharmacy as it does appear we sent medication to pharmacy on 05/29/21. Pharmacy states that phenergan needed PA. I attempted PA via [email or web address], however, we are currently awaiting insurance reponse questions to go further with PA. I waited for over 20 minutes for response questions but nothing has come through as of 5:36 pm. I have instructed the patient that we are still awaiting insurance response and have advised what the problem was originally with not being able to get the script. I also advised that when she gets the medication, she would wait until she was done vomiting and then insert the suppository. She would not insert suppository while vomiting. Patient verbalizes understanding of this information.

## 2021-06-08 NOTE — Telephone Encounter (Signed)
Inbound call from patient. States the pharmacy does not have rhe suppository phenergan prescription. [place].  Patient also asks when she is vomiting, does she take the medication right away or does she get the vomit out first.   Best contact number [phone]

## 2021-06-12 ENCOUNTER — Ambulatory Visit
Admission: RE | Admit: 2021-06-12 | Discharge: 2021-06-12 | Disposition: A | Discharge status: Home / Self Care | Payer: Medicare HMO | Source: Ambulatory Visit | Attending: Physician Assistant | Admitting: Physician Assistant

## 2021-06-12 DIAGNOSIS — R1084 Generalized abdominal pain: Secondary | ICD-10-CM

## 2021-06-12 DIAGNOSIS — R634 Abnormal weight loss: Secondary | ICD-10-CM

## 2021-06-12 DIAGNOSIS — R109 Unspecified abdominal pain: Secondary | ICD-10-CM | POA: Diagnosis not present

## 2021-06-12 DIAGNOSIS — R112 Nausea with vomiting, unspecified: Secondary | ICD-10-CM | POA: Diagnosis not present

## 2021-06-12 DIAGNOSIS — R140 Abdominal distension (gaseous): Secondary | ICD-10-CM | POA: Diagnosis not present

## 2021-06-12 MED ORDER — IOPAMIDOL (ISOVUE-300) INJECTION 61%
100.0000 mL | Freq: Once | INTRAVENOUS | Status: AC | PRN
  Administered 2021-06-12: 100 mL via INTRAVENOUS

## 2021-06-14 ENCOUNTER — Other Ambulatory Visit: Payer: Self-pay | Admitting: Internal Medicine

## 2021-06-14 DIAGNOSIS — F17200 Nicotine dependence, unspecified, uncomplicated: Secondary | ICD-10-CM

## 2021-06-15 ENCOUNTER — Telehealth: Payer: Self-pay

## 2021-06-15 NOTE — Telephone Encounter (Signed)
Spoke with the patient. She tells me she has a strong fear of the "stuff that goes in my mouth." However she is anxious to feel better. She agrees to this plan.  First available that she can go to is 06/22/21. Dr Fuller Plan does not have an opening next week.  Patient is okay with Dr Lyndel Safe taking care of her. Is this okay with the providers involved?

## 2021-06-15 NOTE — Telephone Encounter (Signed)
-----   Message from Alfredia Ferguson, PA-C sent at 06/14/2021  5:03 PM EDT ----- Please call patient and let her know that the CT scan is negative. I think she needs to be scheduled for upper endoscopy with Dr. Fuller Plan for weight loss nausea and vomiting. If Dr. Lynne Leader schedule cannot accommodate in the next weeks, would be okay to set up with another provider if patient is okay with that, and we should include Dr. Fuller Plan on that communication- thank you

## 2021-06-15 NOTE — Telephone Encounter (Signed)
Fine with Dr. Lyndel Safe proceeding with her EGD to expedite her care.

## 2021-06-19 ENCOUNTER — Ambulatory Visit (AMBULATORY_SURGERY_CENTER): Payer: Medicare HMO

## 2021-06-19 VITALS — Ht 66.0 in | Wt 186.0 lb

## 2021-06-19 DIAGNOSIS — R634 Abnormal weight loss: Secondary | ICD-10-CM

## 2021-06-19 DIAGNOSIS — R112 Nausea with vomiting, unspecified: Secondary | ICD-10-CM

## 2021-06-19 NOTE — Progress Notes (Signed)
No egg or soy allergy known to patient  No issues with past sedation with any surgeries or procedures Patient denies ever being told they had issues or difficulty with intubation  No FH of Malignant Hyperthermia No diet pills per patient No home 02 use per patient  No blood thinners per patient  Pt denies issues with constipation  No A fib or A flutter  EMMI video to pt or via [place] 19 guidelines implemented in [place] today with Pt and RN  Pt is fully vaccinated  for [employer].  Pt of Dr Fuller Plan, however, Dr Lyndel Safe is going to do EGD since he had an opening in his schedule earlier that Dr Fuller Plan.  Due to the COVID-19 pandemic we are asking patients to follow certain guidelines.  Pt aware of COVID protocols and [facility] guidelines

## 2021-06-21 ENCOUNTER — Telehealth: Payer: Self-pay | Admitting: Physician Assistant

## 2021-06-21 ENCOUNTER — Other Ambulatory Visit: Payer: Self-pay

## 2021-06-21 NOTE — Telephone Encounter (Signed)
Patient called said the Phenergan medication requires prior auth.

## 2021-06-21 NOTE — Telephone Encounter (Signed)
Pt is requesting her albuterol (VENTOLIN HFA) 108 (90 Base) MCG/ACT inhaler sent to  [facility] [place] AT [address] Phone:  [phone]  Fax:  [phone]

## 2021-06-22 ENCOUNTER — Encounter: Payer: Self-pay | Admitting: Gastroenterology

## 2021-06-22 ENCOUNTER — Other Ambulatory Visit: Payer: Self-pay

## 2021-06-22 ENCOUNTER — Ambulatory Visit (AMBULATORY_SURGERY_CENTER): Payer: Medicare HMO | Admitting: Gastroenterology

## 2021-06-22 VITALS — BP 132/86 | HR 78 | Temp 97.7°F | Resp 22 | Ht 66.0 in | Wt 186.0 lb

## 2021-06-22 DIAGNOSIS — K222 Esophageal obstruction: Secondary | ICD-10-CM | POA: Diagnosis not present

## 2021-06-22 DIAGNOSIS — F319 Bipolar disorder, unspecified: Secondary | ICD-10-CM | POA: Diagnosis not present

## 2021-06-22 DIAGNOSIS — I10 Essential (primary) hypertension: Secondary | ICD-10-CM | POA: Diagnosis not present

## 2021-06-22 DIAGNOSIS — R634 Abnormal weight loss: Secondary | ICD-10-CM | POA: Diagnosis not present

## 2021-06-22 DIAGNOSIS — K219 Gastro-esophageal reflux disease without esophagitis: Secondary | ICD-10-CM | POA: Diagnosis not present

## 2021-06-22 DIAGNOSIS — J45909 Unspecified asthma, uncomplicated: Secondary | ICD-10-CM | POA: Diagnosis not present

## 2021-06-22 DIAGNOSIS — K449 Diaphragmatic hernia without obstruction or gangrene: Secondary | ICD-10-CM

## 2021-06-22 DIAGNOSIS — K2970 Gastritis, unspecified, without bleeding: Secondary | ICD-10-CM

## 2021-06-22 DIAGNOSIS — R112 Nausea with vomiting, unspecified: Secondary | ICD-10-CM | POA: Diagnosis not present

## 2021-06-22 DIAGNOSIS — K2950 Unspecified chronic gastritis without bleeding: Secondary | ICD-10-CM | POA: Diagnosis not present

## 2021-06-22 MED ORDER — SODIUM CHLORIDE 0.9 % IV SOLN: 500.0000 mL | Freq: Once | INTRAVENOUS | Status: DC

## 2021-06-22 MED ORDER — ALBUTEROL SULFATE HFA 108 (90 BASE) MCG/ACT IN AERS: 2.0000 | INHALATION_SPRAY | Freq: Four times a day (QID) | RESPIRATORY_TRACT | 0 refills | Status: DC | PRN

## 2021-06-22 MED ORDER — PANTOPRAZOLE SODIUM 40 MG PO TBEC: 40.0000 mg | DELAYED_RELEASE_TABLET | Freq: Every day | ORAL | 11 refills | Status: DC

## 2021-06-22 NOTE — Progress Notes (Signed)
Upon arrival to recovery noticed small abrasions to forehead and bridge of nose,when asked pt about it she stated "I ran into my ceiling  just before I came here to have my procedure."

## 2021-06-22 NOTE — Op Note (Signed)
[facility] Patient Name: Jasmine Jackson Procedure Date: 06/22/2021 3:21 PM MRN: [id number] Endoscopist: Jackquline Denmark , MD Age: 50 Referring MD:  Date of Birth: September 03, 1971 Gender: Female Account #: [id number] Procedure:                Upper GI endoscopy Indications:              N/V with weight loss. neg CT AP Medicines:                Monitored Anesthesia Care Procedure:                Pre-Anesthesia Assessment:                           - Prior to the procedure, a History and Physical                            was performed, and patient medications and                            allergies were reviewed. The patient's tolerance of                            previous anesthesia was also reviewed. The risks                            and benefits of the procedure and the sedation                            options and risks were discussed with the patient.                            All questions were answered, and informed consent                            was obtained. Prior Anticoagulants: The patient has                            taken no previous anticoagulant or antiplatelet                            agents. ASA Grade Assessment: II - A patient with                            mild systemic disease. After reviewing the risks                            and benefits, the patient was deemed in                            satisfactory condition to undergo the procedure.                           After obtaining informed consent, the endoscope was  passed under direct vision. Throughout the                            procedure, the patient's blood pressure, pulse, and                            oxygen saturations were monitored continuously. The                            GIF [id number] #[id number] was introduced through the                            mouth, and advanced to the second part of duodenum.                            The upper GI endoscopy was  accomplished without                            difficulty. The patient tolerated the procedure                            well. Scope In: Scope Out: Findings:                 The examined esophagus was normal with a wide open                            Schatzki's ring at GE junction, 35 cm from incisors.                           A 2 cm hiatal hernia was present.                           Scattered moderate inflammation characterized by                            erosions and erythema was found in the gastric body                            and in the gastric antrum. Biopsies were taken with                            a cold forceps for histology.                           The examined duodenum was normal. Biopsies for                            histology were taken with a cold forceps for                            evaluation of celiac disease. Complications:            No immediate complications. Estimated Blood Loss:     Estimated blood loss: none. Impression:               -  Small hiatal hernia.                           - Moderate gastritis. Recommendation:           - Patient has a contact number available for                            emergencies. The signs and symptoms of potential                            delayed complications were discussed with the                            patient. Return to normal activities tomorrow.                            Written discharge instructions were provided to the                            patient.                           - Resume previous diet.                           - No aspirin, ibuprofen, naproxen, or other                            non-steroidal anti-inflammatory drugs.                           - Use Protonix (pantoprazole) 40 mg PO daily #30,                            11 refills                           - The findings and recommendations were discussed                            with the patient's family.                            - If still with problems, follow-up with Amy                            Esterwood. Consider gastric emptying scan and                            review all medications. Jackquline Denmark, MD 06/22/2021 3:43:03 PM This report has been signed electronically.

## 2021-06-22 NOTE — Progress Notes (Signed)
Called to room to assist during endoscopic procedure.  Patient ID and intended procedure confirmed with present staff. Received instructions for my participation in the procedure from the performing physician.  

## 2021-06-22 NOTE — Telephone Encounter (Signed)
I have called and spoke with the patient about what is going on with her prior authorization. She is aware that we should find out about if it is authorized in the next 24 hours.

## 2021-06-22 NOTE — Telephone Encounter (Signed)
On the phone with a representative with [employer], who is looking up the patient's prior-authorization. She stated that it appears that it is still in the review process. She reached out to the pharmacy prior authorization department and connected me with a representative. A new prior authorization ended up being submitted over the phone, and has been marked urgent.

## 2021-06-22 NOTE — Progress Notes (Signed)
Report to PACU, RN, vss, BBS= Clear.  

## 2021-06-22 NOTE — Telephone Encounter (Signed)
Received notifiaction that patient's Promethazine suppositories have been denied. She needs to try an alternative. They will cover prochloperazine hcl suppositories, do you want to change?

## 2021-06-22 NOTE — Patient Instructions (Signed)
YOU HAD AN ENDOSCOPIC PROCEDURE TODAY AT [facility]:   Refer to the procedure report that was given to you for any specific questions about what was found during the examination.  If the procedure report does not answer your questions, please call your gastroenterologist to clarify.  If you requested that your care partner not be given the details of your procedure findings, then the procedure report has been included in a sealed envelope for you to review at your convenience later.  YOU SHOULD EXPECT: Some feelings of bloating in the abdomen. Passage of more gas than usual.  Walking can help get rid of the air that was put into your GI tract during the procedure and reduce the bloating. If you had a lower endoscopy (such as a colonoscopy or flexible sigmoidoscopy) you may notice spotting of blood in your stool or on the toilet paper. If you underwent a bowel prep for your procedure, you may not have a normal bowel movement for a few days.  Please Note:  You might notice some irritation and congestion in your nose or some drainage.  This is from the oxygen used during your procedure.  There is no need for concern and it should clear up in a day or so.  SYMPTOMS TO REPORT IMMEDIATELY:   Following upper endoscopy (EGD)  Vomiting of blood or coffee ground material  New chest pain or pain under the shoulder blades  Painful or persistently difficult swallowing  New shortness of breath  Fever of 100F or higher  Black, tarry-looking stools  For urgent or emergent issues, a gastroenterologist can be reached at any hour by calling [phone]. Do not use MyChart messaging for urgent concerns.    DIET:  We do recommend a small meal at first, but then you may proceed to your regular diet.  Drink plenty of fluids but you should avoid alcoholic beverages for 24 hours.  ACTIVITY:  You should plan to take it easy for the rest of today and you should NOT DRIVE or use heavy machinery  until tomorrow (because of the sedation medicines used during the test).    FOLLOW UP: Our staff will call the number listed on your records 48-72 hours following your procedure to check on you and address any questions or concerns that you may have regarding the information given to you following your procedure. If we do not reach you, we will leave a message.  We will attempt to reach you two times.  During this call, we will ask if you have developed any symptoms of COVID 19. If you develop any symptoms (ie: fever, flu-like symptoms, shortness of breath, cough etc.) before then, please call [phone].  If you test positive for Covid 19 in the 2 weeks post procedure, please call and report this information to [place].    If any biopsies were taken you will be contacted by phone or by letter within the next 1-3 weeks.  Please call us at [phone] if you have not heard about the biopsies in 3 weeks.    SIGNATURES/CONFIDENTIALITY: You and/or your care partner have signed paperwork which will be entered into your electronic medical record.  These signatures attest to the fact that that the information above on your After Visit Summary has been reviewed and is understood.  Full responsibility of the confidentiality of this discharge information lies with you and/or your care-partner.    NO ASPIRIN,IBUPROFEN,NAPROXEN,OR OTHER NON-STEROIDAL ANTI-INFLAMMATORY DRUGS,RESUME REMAINDER OF MEDICATIONS.REFER TO REPORT.

## 2021-06-22 NOTE — Progress Notes (Signed)
Medical history reviewed with no changes noted. VS assessed by C.W 

## 2021-06-26 ENCOUNTER — Telehealth: Payer: Self-pay | Admitting: Gastroenterology

## 2021-06-26 ENCOUNTER — Encounter: Payer: Self-pay | Admitting: *Deleted

## 2021-06-26 NOTE — Telephone Encounter (Signed)
Pharmacy said that they will fill her protonix medication. It went through. They also asked regarding her promethazine suppositories. Do you know anything about this?

## 2021-06-26 NOTE — Telephone Encounter (Signed)
Her prior authorization has been denied, so I am currently waiting on Amy Esterwood to let me know what to send in instead.

## 2021-06-26 NOTE — Telephone Encounter (Signed)
Inbound call from pt requesting a call back stating the medication protonix isn't covered under her insurance. Please advise. Thanks

## 2021-06-26 NOTE — Telephone Encounter (Signed)
Inbound call from pt requesting a call back stating the medication protonix isn't covered under her insurance. Please advise. Thanks.

## 2021-06-27 ENCOUNTER — Telehealth: Payer: Self-pay

## 2021-06-27 NOTE — Telephone Encounter (Signed)
  Follow up Call-  Call back number 06/22/2021 08/11/2020  Post procedure Call Back phone  # [phone]  Permission to leave phone message Yes No  Some recent data might be hidden     Patient questions:  Do you have a fever, pain , or abdominal swelling? No. Pain Score  0 *  Have you tolerated food without any problems? Yes.    Have you been able to return to your normal activities? Yes.    Do you have any questions about your discharge instructions: Diet   No. Medications  No. Follow up visit  No.  Do you have questions or concerns about your Care? No.  Actions: * If pain score is 4 or above: No action needed, pain <4.

## 2021-06-27 NOTE — Telephone Encounter (Signed)
No answer, left message to call back later today, B.Per Beagley RN. 

## 2021-06-28 MED ORDER — ONDANSETRON 8 MG PO TBDP: 8.0000 mg | ORAL_TABLET | Freq: Three times a day (TID) | ORAL | 0 refills | Status: DC | PRN

## 2021-06-28 NOTE — Telephone Encounter (Signed)
She actually already had the Zofran in her chart so I called her to ask if it worked for her. She let me know it helps with the nausea, not so much with the vomiting, but was will to use it again. I sent in a refill of what she already had in her chart.  Her insurance denial came back saying what they would cover and phenergan was not one of the medications.

## 2021-06-28 NOTE — Telephone Encounter (Signed)
Is there anything else covered by her insurance? I don't give prochlorperazine much, it has a lot of potential interactions. Has she already failed Zofran or would she prefer to pay for phenergan? I don't think phenergan would be too expensive we could ask her pharmacy how much this would be. I would prefer Zofran 4mg  ODT - 1-2 tabs every 8 hours, unless she has failed that already.

## 2021-06-28 NOTE — Telephone Encounter (Signed)
Patient is wanting to know if she can be given any nausea medication. She is waking up vomiting and doesn't have any medication.

## 2021-06-28 NOTE — Telephone Encounter (Signed)
Dr. Havery Moros you are the DOD,  can I send in the suppositories listed below?  Received notifiaction that patient's Promethazine suppositories have been denied. She needs to try an alternative. They will cover prochloperazine hcl suppositories, do you want to change?

## 2021-06-28 NOTE — Telephone Encounter (Signed)
Inbound call from patient asking if prochlorperazine is able to be approved.

## 2021-07-02 NOTE — Telephone Encounter (Signed)
Dr Havery Moros had me send in [place] for her. Do you want me to still send this in. I'm only able to pull up the 25 mg suppository.

## 2021-07-04 ENCOUNTER — Telehealth: Payer: Self-pay

## 2021-07-04 ENCOUNTER — Ambulatory Visit (INDEPENDENT_AMBULATORY_CARE_PROVIDER_SITE_OTHER): Payer: Medicare HMO | Admitting: Student

## 2021-07-04 ENCOUNTER — Other Ambulatory Visit: Payer: Self-pay

## 2021-07-04 DIAGNOSIS — Z79899 Other long term (current) drug therapy: Secondary | ICD-10-CM | POA: Diagnosis not present

## 2021-07-04 DIAGNOSIS — Z7189 Other specified counseling: Secondary | ICD-10-CM | POA: Diagnosis not present

## 2021-07-04 DIAGNOSIS — K30 Functional dyspepsia: Secondary | ICD-10-CM

## 2021-07-04 NOTE — Telephone Encounter (Signed)
PCP had visit with the patient. It was discovered that she is taking Omeprazole, Pantoprazole and Famotidine. Per Nicoletta Ba, PA the patient will be instructed to stop Omeprazole and Famotidine. Continue the Pantoprazole. She will need follow up as well. If she does not do well, she will need a gastric emptying study.  Called the patient's cell phone. No answer. Left her a message with information and asked she return my call to discuss. Also okay to send a My Chart message if that form of communication is better for her.

## 2021-07-04 NOTE — Progress Notes (Signed)
  [facility] Internal Medicine Residency Telephone Encounter Continuity Care Appointment  HPI:  This telephone encounter was created for [name] on 07/04/2021 for the following purpose/cc discuss medications.   Past Medical History:  Past Medical History:  Diagnosis Date   Anemia    06/19/21-pt has no recollection of this dx   Anxiety    Anxiety and depression    Asthma    Bipolar 1 disorder ([place])    Depression    Excessive daytime sleepiness 11/18/2017   GERD (gastroesophageal reflux disease)    Heart murmur    Hemorrhoids    Hiatal hernia    Hypertension    Internal hemorrhoids    Irritable bowel disease 2014   Toe injury 11/18/2017     ROS:  As per HPI   Assessment / Plan / Recommendations:  Please see A&P under problem oriented charting for assessment of the patient's acute and chronic medical conditions.  As always, pt is advised that if symptoms worsen or new symptoms arise, they should go to an urgent care facility or to to ER for further evaluation.   Consent and Medical Decision Making:  Patient discussed with Dr.  Jimmye Norman This is a telephone encounter between Jasmine Jackson and Sanjuan Dame on 07/04/2021 for medication management. The visit was conducted with the patient located at home and Sanjuan Dame at [facility]. The patient's identity was confirmed using their DOB and current address. The patient has consented to being evaluated through a telephone encounter and understands the associated risks (an examination cannot be done and the patient may need to come in for an appointment) / benefits (allows the patient to remain at home, decreasing exposure to coronavirus). I personally spent 9 minutes on medical discussion.

## 2021-07-05 DIAGNOSIS — Z79899 Other long term (current) drug therapy: Secondary | ICD-10-CM | POA: Insufficient documentation

## 2021-07-05 NOTE — Assessment & Plan Note (Addendum)
Jasmine Jackson is presenting via telephone today to discuss her medications. Mentions that she is currently prescribed three antacids for her chronic dyspepsia but is not sure if she should be taking them. States she has prescriptions for famotidine, omeprazole, and pantoprazole. She was most recently prescribed pantoprazole after EGD earlier this month. She is also inquiring about the biopsies that were taken during that EGD. Unfortunately, I was unable to see those results and have instructed her to reach out to her gastroenterologist's office for this.  Per chart review, appears these medications were all added at different times by different providers. I have reached out to her gastroenterologist for further recommendations. Graciously they responded with recommendations to stop omeprazole and famotidine and continue pantoprazole 40mg  qAM. Both [name] gastroenterology office and myself reached out to her to discuss this. She verbalized understanding that she should only be taking the pantoprazole. She is to follow-up with GI in a few weeks for gastric emptying scan.  - Pantoprazole 40mg  daily - Follow-up with GI

## 2021-07-05 NOTE — Telephone Encounter (Signed)
Discussed with the patient. She will call me in a couple of weeks with an update of her progress.

## 2021-07-06 ENCOUNTER — Other Ambulatory Visit: Payer: Self-pay | Admitting: Internal Medicine

## 2021-07-06 DIAGNOSIS — M62838 Other muscle spasm: Secondary | ICD-10-CM

## 2021-07-07 ENCOUNTER — Encounter: Payer: Self-pay | Admitting: Gastroenterology

## 2021-07-10 NOTE — Progress Notes (Signed)
Internal Medicine Clinic Attending ? ?Case discussed with Dr. Braswell  At the time of the visit.  We reviewed the resident?s history and exam and pertinent patient test results.  I agree with the assessment, diagnosis, and plan of care documented in the resident?s note.  ?

## 2021-07-16 ENCOUNTER — Encounter (HOSPITAL_COMMUNITY): Payer: Self-pay | Admitting: Psychiatry

## 2021-07-16 ENCOUNTER — Telehealth (INDEPENDENT_AMBULATORY_CARE_PROVIDER_SITE_OTHER): Payer: Medicare HMO | Admitting: Psychiatry

## 2021-07-16 ENCOUNTER — Other Ambulatory Visit: Payer: Self-pay

## 2021-07-16 DIAGNOSIS — F410 Panic disorder [episodic paroxysmal anxiety] without agoraphobia: Secondary | ICD-10-CM

## 2021-07-16 DIAGNOSIS — F3170 Bipolar disorder, currently in remission, most recent episode unspecified: Secondary | ICD-10-CM

## 2021-07-16 DIAGNOSIS — F17200 Nicotine dependence, unspecified, uncomplicated: Secondary | ICD-10-CM

## 2021-07-16 DIAGNOSIS — F4310 Post-traumatic stress disorder, unspecified: Secondary | ICD-10-CM

## 2021-07-16 DIAGNOSIS — F419 Anxiety disorder, unspecified: Secondary | ICD-10-CM

## 2021-07-16 MED ORDER — BUPROPION HCL ER (SR) 150 MG PO TB12: 150.0000 mg | ORAL_TABLET | Freq: Two times a day (BID) | ORAL | 3 refills | Status: DC

## 2021-07-16 MED ORDER — LAMOTRIGINE 100 MG PO TABS: 100.0000 mg | ORAL_TABLET | Freq: Every day | ORAL | 3 refills | Status: DC

## 2021-07-16 MED ORDER — MIRTAZAPINE 15 MG PO TBDP: ORAL_TABLET | ORAL | 3 refills | Status: DC

## 2021-07-16 MED ORDER — QUETIAPINE FUMARATE 400 MG PO TABS: 400.0000 mg | ORAL_TABLET | Freq: Every day | ORAL | 3 refills | Status: DC

## 2021-07-16 MED ORDER — QUETIAPINE FUMARATE ER 400 MG PO TB24: ORAL_TABLET | ORAL | 3 refills | Status: DC

## 2021-07-16 MED ORDER — GABAPENTIN 400 MG PO CAPS: 400.0000 mg | ORAL_CAPSULE | Freq: Three times a day (TID) | ORAL | 3 refills | Status: DC

## 2021-07-16 MED ORDER — HYDROXYZINE PAMOATE 50 MG PO CAPS: 50.0000 mg | ORAL_CAPSULE | Freq: Three times a day (TID) | ORAL | 3 refills | Status: DC | PRN

## 2021-07-16 NOTE — Progress Notes (Signed)
[facility] MD/PA/NP OP Progress Note Virtual Visit via Telephone Note  I connected with Jasmine Jackson on 07/16/21 at  2:30 PM EDT by telephone and verified that I am speaking with the correct person using two identifiers.  Location: Patient: home Provider: Clinic   I discussed the limitations, risks, security and privacy concerns of performing an evaluation and management service by telephone and the availability of in person appointments. I also discussed with the patient that there may be a patient responsible charge related to this service. The patient expressed understanding and agreed to proceed.   I provided 30 minutes of non-face-to-face time during this encounter.  07/16/2021 2:51 PM Jasmine Jackson  MRN:  [id number]  Chief Complaint: "I would like to know what medication treat each of my diagnosis"  HPI: 50 year old female seen for follow up psychiatric evaluation. She is a former patient of Dr. M.Kaur who is being transferred to Probation officer for medication management. She has a psychiatric history of Bipolar disorder, PTSD, anxiety, panic attacks, and depression. Currently she is managed on Seroquel XR 400 mg nightly, Seroquel 400 mg nightly, Mirtazapine 15 mg nightly, Lamictal 100 mg daily, gabapentin 400 mg three times daily, and Wellbutrin SR 150 mg BID. She notes her medications are somewhat effective in managing her psychiatric conditions.   Today she was unable to login virtually so her exam was done over the phone. During exam she was tearful at times, pleasant, cooperative, and engaged in conversation. She informed Probation officer that she would like to discuss which medicine treats her diagnosis. Provider discussed patients medicines and their uses. Patient notes that she is anxious most days. She notes that she worries about her 79 year old son who has autism and other children she cares for (ages 35, 58, and 55). Provider conducted a GAD 7 and patient scored an 18. Provider also conducted a PHQ 9  and patient scored a 9. She notes that she eats one big meal a day and notes she has lost 15 pounds over the last 3 months. Patient informed Probation officer that she follows up with her PCP regarding her weight loss soon. Today she denies SI/HI/VAH or paranoia. Patient does endorse symptoms of hypomania such as racing thoughts, irritability, impulsive spending (on things she thinks she needs), and fluctuations in mood.  Patient notes that she has abdominal pain often She notes that Protonix has been somewhat helpful in managing her pain.  At this time she request that her medications not be adjusted. No medication changes made today. Patient agreeable to continue medications as prescribed. She  was referred to outpatient counseling for therapy. No other concerns noted at this time. Visit Diagnosis:    ICD-10-CM   1. Bipolar disorder in full remission, most recent episode unspecified type ([place])  F31.70 QUEtiapine (SEROQUEL XR) 400 MG 24 hr tablet    QUEtiapine (SEROQUEL) 400 MG tablet    mirtazapine (REMERON SOL-TAB) 15 MG disintegrating tablet    lamoTRIgine (LAMICTAL) 100 MG tablet    gabapentin (NEURONTIN) 400 MG capsule    2. PTSD (post-traumatic stress disorder)  F43.10 mirtazapine (REMERON SOL-TAB) 15 MG disintegrating tablet    3. Panic attacks  F41.0 mirtazapine (REMERON SOL-TAB) 15 MG disintegrating tablet    4. Tobacco use disorder  F17.200 buPROPion (WELLBUTRIN SR) 150 MG 12 hr tablet    5. Anxiety  F41.9 gabapentin (NEURONTIN) 400 MG capsule      Past Psychiatric History: Bipolar disorder, PTSD, Anxiety, Tobacco use  Past Medical  History:  Past Medical History:  Diagnosis Date   Anemia    06/19/21-pt has no recollection of this dx   Anxiety    Anxiety and depression    Asthma    Bipolar 1 disorder ([place])    Depression    Excessive daytime sleepiness 11/18/2017   GERD (gastroesophageal reflux disease)    Heart murmur    Hemorrhoids    Hiatal hernia    Hypertension    Internal  hemorrhoids    Irritable bowel disease 2014   Toe injury 11/18/2017    Past Surgical History:  Procedure Laterality Date   COLONOSCOPY  2021   HEMORRHOID SURGERY     INGUINAL HERNIA REPAIR     PARTIAL HYSTERECTOMY     fibroids   TONSILLECTOMY     UPPER GASTROINTESTINAL ENDOSCOPY      Family Psychiatric History: Son autism  Family History:  Family History  Problem Relation Age of Onset   Breast cancer Maternal Aunt        x4   Stomach cancer Maternal Aunt    Prostate cancer Maternal Uncle        x2   Breast cancer Maternal Grandmother    Breast cancer Mother 8   Colon polyps Mother 107   Breast cancer Paternal Grandmother    Colon cancer Neg Hx    Esophageal cancer Neg Hx    Rectal cancer Neg Hx     Social History:  Social History   Socioeconomic History   Marital status: Single    Spouse name: Not on file   Number of children: 1   Years of education: Not on file   Highest education level: Not on file  Occupational History   Occupation: Disability  Tobacco Use   Smoking status: Every Day    Packs/day: 0.50    Years: 37.00    Pack years: 18.50    Types: Cigarettes   Smokeless tobacco: Never   Tobacco comments:    10  CIG A DAY  Vaping Use   Vaping Use: Never used  Substance and Sexual Activity   Alcohol use: No    Alcohol/week: 0.0 standard drinks   Drug use: No   Sexual activity: Yes    Birth control/protection: Condom    Comment: both  Other Topics Concern   Not on file  Social History Narrative   Not on file   Social Determinants of Health   Financial Resource Strain: Not on file  Food Insecurity: Not on file  Transportation Needs: Not on file  Physical Activity: Not on file  Stress: Not on file  Social Connections: Not on file    Allergies:  Allergies  Allergen Reactions   Asa [Aspirin] Other (See Comments)    Heart flutters   Banana Itching    Metabolic Disorder Labs: Lab Results  Component Value Date   HGBA1C 5.4 11/16/2019    No results found for: PROLACTIN Lab Results  Component Value Date   CHOL 159 04/24/2015   TRIG 149.0 04/24/2015   HDL 40.30 04/24/2015   CHOLHDL 4 04/24/2015   VLDL 29.8 04/24/2015   LDLCALC 89 04/24/2015   Lab Results  Component Value Date   TSH 1.06 05/29/2021   TSH 0.771 07/31/2020    Therapeutic Level Labs: No results found for: LITHIUM No results found for: VALPROATE No components found for:  CBMZ  Current Medications: Current Outpatient Medications  Medication Sig Dispense Refill   albuterol (VENTOLIN HFA) 108 (90 Base) MCG/ACT  inhaler Inhale 2 puffs into the lungs every 6 (six) hours as needed for wheezing or shortness of breath. 3 each 0   amLODipine (NORVASC) 5 MG tablet TAKE 1 TABLET(5 MG) BY MOUTH DAILY 90 tablet 1   buPROPion (WELLBUTRIN SR) 150 MG 12 hr tablet Take 1 tablet (150 mg total) by mouth 2 (two) times daily. 60 tablet 3   diclofenac Sodium (VOLTAREN) 1 % GEL APPLY 2 GRAMS TOPICALLY AS NEEDED (Patient not taking: Reported on 06/22/2021) 100 g 2   dicyclomine (BENTYL) 20 MG tablet TAKE 1/2 TABLET (10 MG TOTAL) BY MOUTH 4 (FOUR) TIMES DAILY BEFORE MEALS AND AT BEDTIME. 180 tablet 1   famotidine (PEPCID) 40 MG tablet Take 1 tablet (40 mg total) by mouth 2 (two) times daily. 60 tablet 2   gabapentin (NEURONTIN) 400 MG capsule Take 1 capsule (400 mg total) by mouth 3 (three) times daily. 90 capsule 3   hydrochlorothiazide (HYDRODIURIL) 25 MG tablet TAKE 1 TABLET EVERY DAY 90 tablet 1   hydrOXYzine (ATARAX/VISTARIL) 50 MG tablet Take 1 tablet (50 mg total) by mouth 3 (three) times daily as needed. 90 tablet 2   hydrOXYzine (VISTARIL) 50 MG capsule Take 1 capsule (50 mg total) by mouth 3 (three) times daily as needed. 90 capsule 3   lamoTRIgine (LAMICTAL) 100 MG tablet Take 1 tablet (100 mg total) by mouth daily. 30 tablet 3   linaclotide (LINZESS) 290 MCG CAPS capsule Take 1 capsule (290 mcg total) by mouth daily before breakfast. 90 capsule 3   metoCLOPramide  (REGLAN) 10 MG tablet Take 1 tablet (10 mg total) by mouth every 12 (twelve) hours as needed for nausea. (Patient not taking: No sig reported) 14 tablet 0   mirtazapine (REMERON SOL-TAB) 15 MG disintegrating tablet DISSOLVE 1 TABLET(15 MG) ON THE TONGUE IN THE MORNING AND AT BEDTIME 60 tablet 3   NICOTROL NS 10 MG/ML SOLN USE 1 SPRAY BY NASAL ROUTE AS NEEDED FOR NICOTINE CRAVINGS 40 mL 0   omeprazole (PRILOSEC) 40 MG capsule TAKE 1 CAPSULE TWICE DAILY 180 capsule 0   ondansetron (ZOFRAN-ODT) 8 MG disintegrating tablet Take 1 tablet (8 mg total) by mouth every 8 (eight) hours as needed for nausea or vomiting. 30 tablet 0   pantoprazole (PROTONIX) 40 MG tablet Take 1 tablet (40 mg total) by mouth daily. 30 tablet 11   promethazine (PHENERGAN) 25 MG suppository Place 1 suppository (25 mg total) rectally every 6 (six) hours as needed for nausea or vomiting. (Patient not taking: No sig reported) 12 each 2   QUEtiapine (SEROQUEL XR) 400 MG 24 hr tablet Take 3 tablets at bedtime (1200 mg) 90 tablet 3   QUEtiapine (SEROQUEL) 400 MG tablet Take 1 tablet (400 mg total) by mouth at bedtime. 30 tablet 3   tiZANidine (ZANAFLEX) 4 MG tablet TAKE 1 TABLET EVERY 8 HOURS AS NEEDED FOR MUSCLE SPASMS. 90 tablet 0   No current facility-administered medications for this visit.     Musculoskeletal: Strength & Muscle Tone:  Unable to assess due to telephone visit Gait & Station:  Unable to assess due to telephone visit Patient leans:  Unable to assess due to telephone visit  Psychiatric Specialty Exam: Review of Systems  There were no vitals taken for this visit.There is no height or weight on file to calculate BMI.  General Appearance:  Unable to assess due to telephone visit  Eye Contact:   Unable to assess due to telephone visit  Speech:  Clear and Coherent  and Normal Rate  Volume:  Normal  Mood:  Anxious and Depressed  Affect:   Unable to assess due to telephone visit  Thought Process:  Coherent, Goal  Directed, and NA  Orientation:  Full (Time, Place, and Person)  Thought Content: WDL and Logical   Suicidal Thoughts:  No  Homicidal Thoughts:  No  Memory:  Immediate;   Good Recent;   Good Remote;   Good  Judgement:  Good  Insight:  Good  Psychomotor Activity:  Normal  Concentration:  Concentration: Good and Attention Span: Good  Recall:  Good  Fund of Knowledge: Good  Language: Good  Akathisia:  No  Handed:  Right  AIMS (if indicated): not done  Assets:  Communication Skills Desire for Improvement Financial Resources/Insurance Housing Physical Health Social Support  ADL's:  Intact  Cognition: WNL  Sleep:  Good   Screenings: GAD-7    Flowsheet Row Video Visit from 07/16/2021 in [facility]  Total GAD-7 Score 18      PHQ2-9    Flowsheet Row Video Visit from 07/16/2021 in [facility] Office Visit from 04/11/2021 in [facility] Office Visit from 12/26/2020 in [facility] Office Visit from 09/20/2020 in [facility] Office Visit from 07/31/2020 in [facility]  PHQ-2 Total Score 4 0 0 0 0  PHQ-9 Total Score 9 -- 0 0 6        Assessment and Plan: Patient endorses symptoms of anxiety, depression, and hypomania. She however notes she  is able to cope with theses symptoms and request that her medications not be adjusted. No medication changes made today. Patient agreeable to continue medications as prescribed.   1. Bipolar disorder in full remission, most recent episode unspecified type ([place])  Continue- QUEtiapine (SEROQUEL XR) 400 MG 24 hr tablet; Take 3 tablets at bedtime (1200 mg)  Dispense: 90 tablet; Refill: 3 Continue- QUEtiapine (SEROQUEL) 400 MG tablet; Take 1 tablet (400 mg total) by mouth at bedtime.  Dispense: 30 tablet; Refill: 3 Continue- mirtazapine (REMERON SOL-TAB) 15 MG disintegrating tablet; DISSOLVE 1 TABLET(15  MG) ON THE TONGUE IN THE MORNING AND AT BEDTIME  Dispense: 60 tablet; Refill: 3 Continue- lamoTRIgine (LAMICTAL) 100 MG tablet; Take 1 tablet (100 mg total) by mouth daily.  Dispense: 30 tablet; Refill: 3 Continue- gabapentin (NEURONTIN) 400 MG capsule; Take 1 capsule (400 mg total) by mouth 3 (three) times daily.  Dispense: 90 capsule; Refill: 3 - Ambulatory referral to Social Work  2. PTSD (post-traumatic stress disorder)  Continue- mirtazapine (REMERON SOL-TAB) 15 MG disintegrating tablet; DISSOLVE 1 TABLET(15 MG) ON THE TONGUE IN THE MORNING AND AT BEDTIME  Dispense: 60 tablet; Refill: 3 - Ambulatory referral to Social Work  3. Panic attacks  Continue- mirtazapine (REMERON SOL-TAB) 15 MG disintegrating tablet; DISSOLVE 1 TABLET(15 MG) ON THE TONGUE IN THE MORNING AND AT BEDTIME  Dispense: 60 tablet; Refill: 3 - Ambulatory referral to Social Work  4. Tobacco use disorder  Continue- buPROPion (WELLBUTRIN SR) 150 MG 12 hr tablet; Take 1 tablet (150 mg total) by mouth 2 (two) times daily.  Dispense: 60 tablet; Refill: 3 - Ambulatory referral to Social Work  5. Anxiety  Continue- gabapentin (NEURONTIN) 400 MG capsule; Take 1 capsule (400 mg total) by mouth 3 (three) times daily.  Dispense: 90 capsule; Refill: 3  fill: 3 - Ambulatory referral to Social Work  Follow up in 3 months Follow  up with therapy  Salley Slaughter, NP 07/16/2021, 2:51 PM

## 2021-07-20 ENCOUNTER — Other Ambulatory Visit: Payer: Self-pay | Admitting: Student

## 2021-07-23 ENCOUNTER — Encounter: Payer: Medicare HMO | Admitting: Internal Medicine

## 2021-07-23 ENCOUNTER — Telehealth: Payer: Self-pay | Admitting: *Deleted

## 2021-07-23 NOTE — Telephone Encounter (Signed)
Call to patient concerning missed appointment for today at 1:15 PM.  Message was left for patient to call to reschedule the missed appointment.  Sander Nephew, RN 07/23/2021 1:57 PM.

## 2021-07-26 ENCOUNTER — Telehealth: Payer: Self-pay | Admitting: Physician Assistant

## 2021-07-26 NOTE — Telephone Encounter (Signed)
Pt called stating that she takes Famotidine in the evening and wakes up with a lot of gas and sharp pain in the morning. She also reports that Ondansetron is not working for her. she wants to know what she can do to relief her sxs.

## 2021-07-26 NOTE — Telephone Encounter (Signed)
Spoke with patient, patient states she last vomited this Monday. Reports she has tried to take zofran but is unable to because the zofran makes her sick trying to take it down. Patient remains on protonix and bentyl. I advised patient of Gastric Emptying Study, patient seemed apprehensive and said she would think about. In the meantime patient would like to know what she can do to settle her continued symptoms. Please advise, thank you  Last Telephone Encounter: 7/13 PCP had visit with the patient. It was discovered that she is taking Omeprazole, Pantoprazole and Famotidine. Per Nicoletta Ba, PA the patient will be instructed to stop Omeprazole and Famotidine. Continue the Pantoprazole. She will need follow up as well. If she does not do well, she will need a gastric emptying study.

## 2021-07-30 ENCOUNTER — Other Ambulatory Visit: Payer: Self-pay

## 2021-07-30 DIAGNOSIS — R112 Nausea with vomiting, unspecified: Secondary | ICD-10-CM

## 2021-07-30 DIAGNOSIS — R634 Abnormal weight loss: Secondary | ICD-10-CM

## 2021-07-30 MED ORDER — AMBULATORY NON FORMULARY MEDICATION: 0 refills | Status: DC

## 2021-07-30 MED ORDER — PANTOPRAZOLE SODIUM 40 MG PO TBEC
40.0000 mg | DELAYED_RELEASE_TABLET | Freq: Two times a day (BID) | ORAL | 3 refills | Status: DC
Start: 2021-07-30 — End: 2021-08-20

## 2021-07-30 NOTE — Telephone Encounter (Signed)
Spoke with the patient. She does not have Phenergan suppositories. This was denied by [employer]. Zofran has not been effective for her. She will stop Omeprazole and Famotidine. She is taking Protonix ac breakfast. Agrees to add ac dinner dose as well. GI Cocktail prescription faxed to [place] with patient's insurance information. She is considering the GES and will expect a call from the schedulers for radiology for scheduling. Understands this is done in the hospitals and is a 4 hour test.

## 2021-07-30 NOTE — Telephone Encounter (Signed)
Inbound call from patient. States insurance does not cover GI cocktail.

## 2021-07-30 NOTE — Telephone Encounter (Signed)
Spoke with [employer]. They do not fill insurance. This prescription costs $103.13 out of pocket which the patient cannot do. Tried to call [facility]. Phone line will not ring through. Faxed the Rx to [facility].

## 2021-07-30 NOTE — Telephone Encounter (Signed)
Jasmine Bamberg Do you have the denial letter from her insurance for the Promethazine?

## 2021-07-30 NOTE — Telephone Encounter (Signed)
The promethazine suppositories were denied a month ago by her insurance.

## 2021-07-31 ENCOUNTER — Other Ambulatory Visit: Payer: Self-pay

## 2021-07-31 NOTE — Telephone Encounter (Signed)
Attempting an appeal for the promethazine suppositories. I do not have the reason for denial. Gave the insurance company her office note.

## 2021-08-02 ENCOUNTER — Telehealth: Payer: Self-pay | Admitting: Gastroenterology

## 2021-08-02 NOTE — Telephone Encounter (Signed)
Current Outpatient Medications on File Prior to Visit  Medication Sig Dispense Refill   albuterol (VENTOLIN HFA) 108 (90 Base) MCG/ACT inhaler Inhale 2 puffs into the lungs every 6 (six) hours as needed for wheezing or shortness of breath. 3 each 0   AMBULATORY NON FORMULARY MEDICATION Medication Name: GI Cocktail 236m 2% viscous lidocaine 2738mbentyl '10mg'$ /25m61m44m59mlanta SIG 30 ml QID prn gas and stomach pain-do not use more than 3 days in a row 1350 mL 0   amLODipine (NORVASC) 5 MG tablet TAKE 1 TABLET(5 MG) BY MOUTH DAILY 90 tablet 1   buPROPion (WELLBUTRIN SR) 150 MG 12 hr tablet Take 1 tablet (150 mg total) by mouth 2 (two) times daily. 60 tablet 3   diclofenac Sodium (VOLTAREN) 1 % GEL APPLY 2 GRAMS TOPICALLY AS NEEDED (Patient not taking: Reported on 06/22/2021) 100 g 2   dicyclomine (BENTYL) 20 MG tablet TAKE 1/2 TABLET (10 MG TOTAL) BY MOUTH 4 (FOUR) TIMES DAILY BEFORE MEALS AND AT BEDTIME. 180 tablet 1   gabapentin (NEURONTIN) 400 MG capsule Take 1 capsule (400 mg total) by mouth 3 (three) times daily. 90 capsule 3   hydrochlorothiazide (HYDRODIURIL) 25 MG tablet TAKE 1 TABLET EVERY DAY 90 tablet 1   hydrOXYzine (ATARAX/VISTARIL) 50 MG tablet Take 1 tablet (50 mg total) by mouth 3 (three) times daily as needed. 90 tablet 2   hydrOXYzine (VISTARIL) 50 MG capsule Take 1 capsule (50 mg total) by mouth 3 (three) times daily as needed. 90 capsule 3   lamoTRIgine (LAMICTAL) 100 MG tablet Take 1 tablet (100 mg total) by mouth daily. 30 tablet 3   linaclotide (LINZESS) 290 MCG CAPS capsule Take 1 capsule (290 mcg total) by mouth daily before breakfast. 90 capsule 3   metoCLOPramide (REGLAN) 10 MG tablet Take 1 tablet (10 mg total) by mouth every 12 (twelve) hours as needed for nausea. (Patient not taking: No sig reported) 14 tablet 0   mirtazapine (REMERON SOL-TAB) 15 MG disintegrating tablet DISSOLVE 1 TABLET(15 MG) ON THE TONGUE IN THE MORNING AND AT BEDTIME 60 tablet 3   NICOTROL NS 10 MG/ML  SOLN USE 1 SPRAY BY NASAL ROUTE AS NEEDED FOR NICOTINE CRAVINGS 40 mL 0   ondansetron (ZOFRAN-ODT) 8 MG disintegrating tablet Take 1 tablet (8 mg total) by mouth every 8 (eight) hours as needed for nausea or vomiting. 30 tablet 0   pantoprazole (PROTONIX) 40 MG tablet Take 1 tablet (40 mg total) by mouth 2 (two) times daily before a meal. 60 tablet 3   promethazine (PHENERGAN) 25 MG suppository Place 1 suppository (25 mg total) rectally every 6 (six) hours as needed for nausea or vomiting. (Patient not taking: No sig reported) 12 each 2   QUEtiapine (SEROQUEL XR) 400 MG 24 hr tablet Take 3 tablets at bedtime (1200 mg) 90 tablet 3   QUEtiapine (SEROQUEL) 400 MG tablet Take 1 tablet (400 mg total) by mouth at bedtime. 30 tablet 3   tiZANidine (ZANAFLEX) 4 MG tablet TAKE 1 TABLET EVERY 8 HOURS AS NEEDED FOR MUSCLE SPASMS. 90 tablet 0   No current facility-administered medications on file prior to visit.   Last OV 07/04/21: Assessment / Plan / Recommendations:  Please see A&P under problem oriented charting for assessment of the patient's acute and chronic medical conditions.  As always, pt is advised that if symptoms worsen or new symptoms arise, they should go to an urgent care facility or to to ER for further evaluation.

## 2021-08-02 NOTE — Telephone Encounter (Signed)
Spoke with patient would like to know if her medication is causing her to loose weight. Patient reports she has lost "25 pounds in the last 3 months" Patient states she has no appetite, taking fiber pills daily, continues on Linzess and drinking Ensure. She has on and off constipation and diarrhea. Please advise,thank you

## 2021-08-02 NOTE — Telephone Encounter (Signed)
Patient appeal has came back and they are still denying it.

## 2021-08-02 NOTE — Telephone Encounter (Signed)
Inbound call from patient have questions about if a bleeding hemorrhoid causes weight loss.

## 2021-08-02 NOTE — Telephone Encounter (Signed)
Insurance wins again. I will let the patient know.

## 2021-08-02 NOTE — Telephone Encounter (Signed)
Spoke with patient, made her aware of rec's per provider. Patient advised to speak with PCP before proceeding with anything. Patient verbalized understanding and nothing further needed at this time.

## 2021-08-06 ENCOUNTER — Other Ambulatory Visit: Payer: Self-pay

## 2021-08-06 ENCOUNTER — Telehealth: Payer: Self-pay | Admitting: Physician Assistant

## 2021-08-06 NOTE — Telephone Encounter (Signed)
Pls call pt. She called inquiring about the date of his GES at [place] but I do not see that it was scheduled. Pt insisted that it was.

## 2021-08-06 NOTE — Telephone Encounter (Signed)
Patient agreed to have the gastric emptying study. She is waiting to be contacted by scheduling.  Called scheduling. Appointment is 08/14/21 at [facility]. Arrive at 10:45 am. No stomach medication for 8 hours prior. NPO after midnight.  Test takes 4 hours to complete. I was explaining this to the patient. She began vomiting and was unable to finish getting the information.

## 2021-08-07 NOTE — Telephone Encounter (Signed)
Patient instructed for her gastric emptying scan.

## 2021-08-13 ENCOUNTER — Telehealth: Payer: Self-pay | Admitting: Internal Medicine

## 2021-08-13 NOTE — Telephone Encounter (Signed)
Pt has questions about some of her medications she has been taken off of by her specialists. Pt states she was taken off of all of her stomach medicines.

## 2021-08-14 ENCOUNTER — Encounter (HOSPITAL_COMMUNITY)
Admission: RE | Admit: 2021-08-14 | Discharge: 2021-08-14 | Disposition: A | Discharge status: Home / Self Care | Payer: Medicare HMO | Source: Ambulatory Visit | Attending: Physician Assistant | Admitting: Physician Assistant

## 2021-08-14 ENCOUNTER — Other Ambulatory Visit: Payer: Self-pay

## 2021-08-14 DIAGNOSIS — K30 Functional dyspepsia: Secondary | ICD-10-CM | POA: Diagnosis not present

## 2021-08-14 DIAGNOSIS — R112 Nausea with vomiting, unspecified: Secondary | ICD-10-CM | POA: Insufficient documentation

## 2021-08-14 DIAGNOSIS — K3189 Other diseases of stomach and duodenum: Secondary | ICD-10-CM | POA: Diagnosis not present

## 2021-08-14 MED ORDER — TECHNETIUM TC 99M SULFUR COLLOID
2.2000 | Freq: Once | INTRAVENOUS | Status: AC | PRN
  Administered 2021-08-14: 2.2 via INTRAVENOUS

## 2021-08-14 NOTE — Telephone Encounter (Signed)
Pls call pt. She has questions about GES scheduled this morning. Specifically, she wants to know if she needs to take a medication that she was given.

## 2021-08-14 NOTE — Telephone Encounter (Signed)
Spoke with the patient. Confirmed she is not to take any stomach medications including the GI cocktail.

## 2021-08-15 DIAGNOSIS — L609 Nail disorder, unspecified: Secondary | ICD-10-CM | POA: Diagnosis not present

## 2021-08-15 DIAGNOSIS — M79644 Pain in right finger(s): Secondary | ICD-10-CM | POA: Diagnosis not present

## 2021-08-16 ENCOUNTER — Ambulatory Visit (INDEPENDENT_AMBULATORY_CARE_PROVIDER_SITE_OTHER): Payer: Medicare HMO | Admitting: Podiatry

## 2021-08-16 ENCOUNTER — Encounter: Payer: Self-pay | Admitting: Podiatry

## 2021-08-16 ENCOUNTER — Ambulatory Visit (INDEPENDENT_AMBULATORY_CARE_PROVIDER_SITE_OTHER): Payer: Medicare HMO

## 2021-08-16 ENCOUNTER — Other Ambulatory Visit: Payer: Self-pay

## 2021-08-16 DIAGNOSIS — M779 Enthesopathy, unspecified: Secondary | ICD-10-CM

## 2021-08-16 DIAGNOSIS — M7751 Other enthesopathy of right foot: Secondary | ICD-10-CM

## 2021-08-16 DIAGNOSIS — L03011 Cellulitis of right finger: Secondary | ICD-10-CM | POA: Diagnosis not present

## 2021-08-16 MED ORDER — MELOXICAM 15 MG PO TABS: 15.0000 mg | ORAL_TABLET | Freq: Every day | ORAL | 2 refills | Status: DC

## 2021-08-16 MED ORDER — TRIAMCINOLONE ACETONIDE 10 MG/ML IJ SUSP
10.0000 mg | Freq: Once | INTRAMUSCULAR | Status: AC
  Administered 2021-08-16: 10 mg

## 2021-08-16 NOTE — Progress Notes (Signed)
Subjective:   Patient ID: Jasmine Jackson, female   DOB: 50 y.o.   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

## 2021-08-17 NOTE — Telephone Encounter (Signed)
Patient called states she spoke with [facility] and they are recommending she try Prochlorperazine medication so she is wanting your opinion.

## 2021-08-20 ENCOUNTER — Encounter: Payer: Self-pay | Admitting: Physician Assistant

## 2021-08-20 ENCOUNTER — Ambulatory Visit (INDEPENDENT_AMBULATORY_CARE_PROVIDER_SITE_OTHER): Payer: Medicare HMO | Admitting: Physician Assistant

## 2021-08-20 VITALS — BP 130/78 | HR 88 | Ht 66.0 in | Wt 183.0 lb

## 2021-08-20 DIAGNOSIS — K581 Irritable bowel syndrome with constipation: Secondary | ICD-10-CM | POA: Diagnosis not present

## 2021-08-20 DIAGNOSIS — K219 Gastro-esophageal reflux disease without esophagitis: Secondary | ICD-10-CM

## 2021-08-20 DIAGNOSIS — R112 Nausea with vomiting, unspecified: Secondary | ICD-10-CM

## 2021-08-20 DIAGNOSIS — K2970 Gastritis, unspecified, without bleeding: Secondary | ICD-10-CM

## 2021-08-20 MED ORDER — PANTOPRAZOLE SODIUM 40 MG PO TBEC: 40.0000 mg | DELAYED_RELEASE_TABLET | Freq: Every day | ORAL | 3 refills | Status: DC

## 2021-08-20 MED ORDER — PROCHLORPERAZINE 25 MG RE SUPP: RECTAL | 3 refills | Status: DC

## 2021-08-20 MED ORDER — DICYCLOMINE HCL 20 MG PO TABS: 10.0000 mg | ORAL_TABLET | Freq: Three times a day (TID) | ORAL | 0 refills | Status: DC | PRN

## 2021-08-20 NOTE — Patient Instructions (Addendum)
If you are age 51 or younger, your body mass index should be between 19-25. Your Body mass index is 29.54 kg/m. If this is out of the aformentioned range listed, please consider follow up with your Primary Care Provider.  __________________________________________________________  The [facility] providers would like to encourage you to use [facility] to communicate with providers for non-urgent requests or questions.  Due to long hold times on the telephone, sending your provider a message by [facility] may be a faster and more efficient way to get a response.  Please allow 48 business hours for a response.  Please remember that this is for non-urgent requests.   Decrease your Pantoprazole 40 mg to 1 tablet in the morning before breakfast  Use Dicyclomine 20 mg 1 half tablet three times daily as needed for abdominal pain, cramping, and/or bloating  Continue Linzess 290 mcg 1 capsule daily  START Compazine suppositories 25 mg inserted rectally every 6-8 hours as needed for nausea/vomiting  You have been scheduled to see Dr. Fuller Plan on 08/30/2021 at 11:10 am for a hemorrhoid banding.  Thank you for entrusting me with your care and choosing [facility].  Amy Esterwood, PA-C

## 2021-08-21 ENCOUNTER — Telehealth: Payer: Self-pay

## 2021-08-21 ENCOUNTER — Encounter: Payer: Self-pay | Admitting: Physician Assistant

## 2021-08-21 NOTE — Telephone Encounter (Signed)
-----   Message from Ladene Artist, MD sent at 08/19/2021  2:45 PM EDT ----- Regarding: FW: Please check on her bleeding. Offer hemorrhoid banding if her bleeding has slowed, stopped. If bleeding is persistently, heavy let me know. ----- Message ----- From: Carol Ada, MD Sent: 08/19/2021  12:01 PM EDT To: Ladene Artist, MD  Norberto Sorenson,   The patient called about hematochezia.  Your last colonoscopy for this issue was 08/11/2020 and the colonoscopy was unremarkable.  The images of the hemorrhoids were on the larger side.    Her bleeding started on Thursday and she feels that it worsened in that now she had clots.  She is not in any severe distress, but she wanted to know if anything can be done for her hemorrhoids.  I instructed her to place an ice bag on the area as she already tried over-the-counter remedies without success.  Hopefully this will help to vasoconstrict the vessels to slow or arrest the bleeding.  Saralyn Pilar

## 2021-08-21 NOTE — Progress Notes (Signed)
Subjective:    Patient ID: Jasmine Jackson, female    DOB: 05-30-1971, 50 y.o.   MRN: [id number]  HPI [name] is a pleasant 50 year old African-American female, established with Dr. Fuller Plan, and new to myself.  She comes in today for follow-up after being seen in the office on 05/29/2021 with 3 to 64-monthhistory of episodic abdominal pain distention nausea and occasional vomiting.  At that time she was having episodes that would last for a few days, subside and then recur.  There had been an associated 20 pound weight loss over the prior 3 to 4 months. She has since undergone CT scan of the abdomen and pelvis with contrast which was negative, then EGD on 06/22/2021 per Dr. GLyndel Safewhich showed a wide open Schatzki's ring, 2 cm hiatal hernia and moderate gastritis.  Biopsies of the duodenum were negative, gastric biopsy showed slight chronic inflammation no H. pylori. She also had gastric emptying scan which showed somewhat rapid gastric emptying with less than 30% retention at 1 hour. She has also since stopped Wellbutrin as suggested after she had called regarding whether any of her medications may be associated with weight loss. Overall she says she is feeling somewhat better.  She is still having some symptoms but says that these episodes are not lasting as long and are not as bad.  She said that previously she had been having some foul odor to her emesis and that has completely resolved.  She continues to have some intermittent nausea and bloating.  We were trying to get Phenergan approved for her but her insurance will not cover and now apparently they have suggested Compazine suppositories as an alternative.  She has been taking Linzess 290 mcg daily, remains on Protonix 40 mg p.o. twice daily, and has generally been taking dicyclomine 3 times daily. Her weight loss has leveled off.  She was 185 in June 2022 and 183 today.  Also has concerns today about bleeding from her internal hemorrhoids and says  that she was told by the on-call doctor to get set up for a banding appointment.  She has been having intermittent bleeding from the hemorrhoids fairly long-term but has had an increase in the amount of blood in frequency recently.  Last colonoscopy August 2021 with finding of 5 polyps, all 6 to 7 mm in size resected, there is an AVM in the ascending colon nonbleeding and noted to have large grade 3 internal hemorrhoids.  Path on the polyps consistent with hyperplastic polyps   Review of Systems. Pertinent positive and negative review of systems were noted in the above HPI section.  All other review of systems was otherwise negative.   Outpatient Encounter Medications as of 08/20/2021  Medication Sig   albuterol (VENTOLIN HFA) 108 (90 Base) MCG/ACT inhaler Inhale 2 puffs into the lungs every 6 (six) hours as needed for wheezing or shortness of breath.   AMBULATORY NON FORMULARY MEDICATION Medication Name: GI Cocktail 2715m2% viscous lidocaine 27030mentyl '10mg'$ /5ml61m0ml41manta SIG 30 ml QID prn gas and stomach pain-do not use more than 3 days in a row   amLODipine (NORVASC) 5 MG tablet TAKE 1 TABLET(5 MG) BY MOUTH DAILY   diclofenac Sodium (VOLTAREN) 1 % GEL APPLY 2 GRAMS TOPICALLY AS NEEDED   gabapentin (NEURONTIN) 400 MG capsule Take 1 capsule (400 mg total) by mouth 3 (three) times daily.   hydrochlorothiazide (HYDRODIURIL) 25 MG tablet TAKE 1 TABLET EVERY DAY   hydrOXYzine (ATARAX/VISTARIL) 50 MG tablet  Take 1 tablet (50 mg total) by mouth 3 (three) times daily as needed.   lamoTRIgine (LAMICTAL) 100 MG tablet Take 1 tablet (100 mg total) by mouth daily.   linaclotide (LINZESS) 290 MCG CAPS capsule Take 1 capsule (290 mcg total) by mouth daily before breakfast.   meloxicam (MOBIC) 15 MG tablet Take 1 tablet (15 mg total) by mouth daily.   NICOTROL NS 10 MG/ML SOLN USE 1 SPRAY BY NASAL ROUTE AS NEEDED FOR NICOTINE CRAVINGS   ondansetron (ZOFRAN-ODT) 8 MG disintegrating tablet Take 1 tablet  (8 mg total) by mouth every 8 (eight) hours as needed for nausea or vomiting.   prochlorperazine (COMPAZINE) 25 MG suppository Use 1 suppository rectally every 6-8 hours as needed nausea/vomiting   QUEtiapine (SEROQUEL XR) 400 MG 24 hr tablet Take 3 tablets at bedtime (1200 mg)   QUEtiapine (SEROQUEL) 400 MG tablet Take 1 tablet (400 mg total) by mouth at bedtime.   sulfamethoxazole-trimethoprim (BACTRIM DS) 800-160 MG tablet Take 1 tablet by mouth 2 (two) times daily.   tiZANidine (ZANAFLEX) 4 MG tablet TAKE 1 TABLET EVERY 8 HOURS AS NEEDED FOR MUSCLE SPASMS.   [DISCONTINUED] buPROPion (WELLBUTRIN SR) 150 MG 12 hr tablet Take 1 tablet (150 mg total) by mouth 2 (two) times daily.   [DISCONTINUED] dicyclomine (BENTYL) 20 MG tablet TAKE 1/2 TABLET (10 MG TOTAL) BY MOUTH 4 (FOUR) TIMES DAILY BEFORE MEALS AND AT BEDTIME.   [DISCONTINUED] mirtazapine (REMERON SOL-TAB) 15 MG disintegrating tablet DISSOLVE 1 TABLET(15 MG) ON THE TONGUE IN THE MORNING AND AT BEDTIME   [DISCONTINUED] pantoprazole (PROTONIX) 40 MG tablet Take 1 tablet (40 mg total) by mouth 2 (two) times daily before a meal.   [DISCONTINUED] promethazine (PHENERGAN) 25 MG suppository Place 1 suppository (25 mg total) rectally every 6 (six) hours as needed for nausea or vomiting.   dicyclomine (BENTYL) 20 MG tablet Take 0.5 tablets (10 mg total) by mouth 3 (three) times daily as needed for spasms (abdominal pain, cramping, bloating).   metoCLOPramide (REGLAN) 10 MG tablet Take 1 tablet (10 mg total) by mouth every 12 (twelve) hours as needed for nausea. (Patient not taking: No sig reported)   pantoprazole (PROTONIX) 40 MG tablet Take 1 tablet (40 mg total) by mouth daily.   [DISCONTINUED] hydrOXYzine (VISTARIL) 50 MG capsule Take 1 capsule (50 mg total) by mouth 3 (three) times daily as needed.   No facility-administered encounter medications on file as of 08/20/2021.   Allergies  Allergen Reactions   Asa [Aspirin] Other (See Comments)     Heart flutters   Banana Itching   Patient Active Problem List   Diagnosis Date Noted   Encounter for medication management 07/05/2021   Bipolar disorder in full remission ([place]) 04/18/2021   Anxiety 09/21/2020   PTSD (post-traumatic stress disorder) 09/21/2020   Weight loss 07/31/2020   Acanthosis nigricans 11/16/2019   Nocturnal leg cramps 10/18/2018   Hot flashes 11/27/2016   Tobacco use  10/18/2016   Panic attacks 09/09/2016   Health care maintenance 01/11/2016   Hypertension 04/24/2015   Bipolar disorder ([place]) 04/16/2014   History of gout 04/01/2014   Grade III internal hemorrhoids 12/08/2013   Irritable bowel syndrome 12/08/2013   Dyspepsia 12/08/2013   Social History   Socioeconomic History   Marital status: Single    Spouse name: Not on file   Number of children: 1   Years of education: Not on file   Highest education level: Not on file  Occupational History  Occupation: Disability  Tobacco Use   Smoking status: Every Day    Packs/day: 0.50    Years: 37.00    Pack years: 18.50    Types: Cigarettes   Smokeless tobacco: Never   Tobacco comments:    10  CIG A DAY  Vaping Use   Vaping Use: Never used  Substance and Sexual Activity   Alcohol use: No    Alcohol/week: 0.0 standard drinks   Drug use: No   Sexual activity: Yes    Birth control/protection: Condom    Comment: both  Other Topics Concern   Not on file  Social History Narrative   Not on file   Social Determinants of Health   Financial Resource Strain: Not on file  Food Insecurity: Not on file  Transportation Needs: Not on file  Physical Activity: Not on file  Stress: Not on file  Social Connections: Not on file  Intimate Partner Violence: Not on file    Ms. Moisan's family history includes Breast cancer in her maternal aunt, maternal grandmother, and paternal grandmother; Breast cancer (age of onset: 87) in her mother; Colon polyps (age of onset: 32) in her mother; Prostate cancer in her  maternal uncle; Stomach cancer in her maternal aunt.      Objective:    Vitals:   08/20/21 1426  BP: 130/78  Pulse: 88    Physical Exam Well-developed well-nourished African-American female in no acute distress.  Height, Weight, 183 BMI 29.5  HEENT; nontraumatic normocephalic, EOMI, PE R LA, sclera anicteric. Oropharynx; not examined today Neck; supple, no JVD Cardiovascular; regular rate and rhythm with S1-S2, no murmur rub or gallop Pulmonary; Clear bilaterally Abdomen; soft, mild rather generalized abdominal tenderness, nonfocal, nondistended, no palpable mass or hepatosplenomegaly, bowel sounds are active Rectal; not done today Skin; benign exam, no jaundice rash or appreciable lesions Extremities; no clubbing cyanosis or edema skin warm and dry Neuro/Psych; alert and oriented x4, grossly nonfocal mood and affect appropriate        Assessment & Plan:   #21 50 year old African-American female with chronic GERD, and recent history of episodes of abdominal discomfort, nausea and vomiting. Patient had also had a weight loss over the prior 3 to 4 months. Work-up to date has been negative other than finding of moderate gastritis. I think her symptoms are/were multifactoral with weight loss secondary to underlying stress, and use of Wellbutrin which has been discontinued. She may have a component of functional abdominal pain/dyspepsia/IBS accounting for the abdominal discomfort and nausea.  Work-up has been reassuring and symptoms are significantly improved at this point  #2 internal hemorrhoids, with increasing frequency of episodes of bleeding Previously documented grade 3 internal hemorrhoids a colonoscopy August 2021  #3 history of colon polyps-5 polyps all hyperplastic August 2021 #4 history of anxiety/PTSD/bipolar disorder #5 hypertension  Plan continue pantoprazole but decreased to 40 mg p.o. every morning Have sent prescription for Compazine suppositories 25 mg every 6  hours as needed for severe nausea or vomiting only Decrease dicyclomine to 10 mg 3 times daily as needed Remain off Wellbutrin Continue Linzess 290 mcg daily. Office follow-up with Dr. Fuller Plan or myself as needed.    Alfredia Ferguson PA-C 08/21/2021   Cc: Marianna Payment, MD

## 2021-08-21 NOTE — Progress Notes (Signed)
Reviewed and agree with management plan.  Krist Rosenboom T. Takeshia Wenk, MD FACG 

## 2021-08-21 NOTE — Telephone Encounter (Signed)
Patient reports that the bleeding yesterday had resolved, but returned this am.  She woke up ;this am with her underclothes and her gown bloody.  She reports that the bleeding is bright red and has some small clots.  She is having some mild lower abdominal cramping.  She is scheduled to come in tomorrow to see Alonza Bogus, PA at 9:30

## 2021-08-22 ENCOUNTER — Ambulatory Visit (INDEPENDENT_AMBULATORY_CARE_PROVIDER_SITE_OTHER): Payer: Medicare HMO | Admitting: Gastroenterology

## 2021-08-22 ENCOUNTER — Encounter: Payer: Self-pay | Admitting: Gastroenterology

## 2021-08-22 VITALS — BP 110/70 | Ht 66.0 in | Wt 182.6 lb

## 2021-08-22 DIAGNOSIS — K642 Third degree hemorrhoids: Secondary | ICD-10-CM | POA: Diagnosis not present

## 2021-08-22 DIAGNOSIS — K625 Hemorrhage of anus and rectum: Secondary | ICD-10-CM

## 2021-08-22 MED ORDER — HYDROCORTISONE ACETATE 25 MG RE SUPP: 25.0000 mg | Freq: Every day | RECTAL | 1 refills | Status: AC

## 2021-08-22 NOTE — Progress Notes (Signed)
Reviewed and agree with management plan.  Lorri Fukuhara T. Ailey Wessling, MD FACG 

## 2021-08-22 NOTE — Progress Notes (Signed)
08/22/2021 Jasmine Jackson [id number] May 05, 1971   HISTORY OF PRESENT ILLNESS: This is a 50 year old female is a patient Dr. Lynne Leader.  She was just seen 2 days ago by Nicoletta Ba, PA-C, for rectal bleeding among several other complaints.  Bleeding was deemed to be secondary to her hemorrhoids, which were seen on her colonoscopy last year and has been an issue for her in the past.  She was scheduled for hemorrhoid banding with Dr. Fuller Plan in September.  She called back then yesterday again with complaints of rectal bleeding so was put on to see me again today.  Now this morning she has had no rectal bleeding.  It is intermittent bright red blood.   Past Medical History:  Diagnosis Date   Anemia    06/19/21-pt has no recollection of this dx   Anxiety    Anxiety and depression    Asthma    Bipolar 1 disorder ([place])    Depression    Excessive daytime sleepiness 11/18/2017   GERD (gastroesophageal reflux disease)    Heart murmur    Hemorrhoids    Hiatal hernia    Hypertension    Internal hemorrhoids    Irritable bowel disease 2014   Toe injury 11/18/2017   Past Surgical History:  Procedure Laterality Date   COLONOSCOPY  2021   HEMORRHOID SURGERY     INGUINAL HERNIA REPAIR     PARTIAL HYSTERECTOMY     fibroids   TONSILLECTOMY     UPPER GASTROINTESTINAL ENDOSCOPY      reports that she has been smoking cigarettes. She has a 18.50 pack-year smoking history. She has never used smokeless tobacco. She reports that she does not drink alcohol and does not use drugs. family history includes Breast cancer in her maternal aunt, maternal grandmother, and paternal grandmother; Breast cancer (age of onset: 1) in her mother; Colon polyps (age of onset: 65) in her mother; Prostate cancer in her maternal uncle; Stomach cancer in her maternal aunt. Allergies  Allergen Reactions   Asa [Aspirin] Other (See Comments)    Heart flutters   Banana Itching      Outpatient Encounter Medications as  of 08/22/2021  Medication Sig   albuterol (VENTOLIN HFA) 108 (90 Base) MCG/ACT inhaler Inhale 2 puffs into the lungs every 6 (six) hours as needed for wheezing or shortness of breath.   AMBULATORY NON FORMULARY MEDICATION Medication Name: GI Cocktail 278m 2% viscous lidocaine 2780mbentyl '10mg'$ /75m26m61m70mlanta SIG 30 ml QID prn gas and stomach pain-do not use more than 3 days in a row   amLODipine (NORVASC) 5 MG tablet TAKE 1 TABLET(5 MG) BY MOUTH DAILY   diclofenac Sodium (VOLTAREN) 1 % GEL APPLY 2 GRAMS TOPICALLY AS NEEDED   dicyclomine (BENTYL) 20 MG tablet Take 0.5 tablets (10 mg total) by mouth 3 (three) times daily as needed for spasms (abdominal pain, cramping, bloating).   gabapentin (NEURONTIN) 400 MG capsule Take 1 capsule (400 mg total) by mouth 3 (three) times daily.   hydrochlorothiazide (HYDRODIURIL) 25 MG tablet TAKE 1 TABLET EVERY DAY   hydrOXYzine (ATARAX/VISTARIL) 50 MG tablet Take 1 tablet (50 mg total) by mouth 3 (three) times daily as needed.   lamoTRIgine (LAMICTAL) 100 MG tablet Take 1 tablet (100 mg total) by mouth daily.   linaclotide (LINZESS) 290 MCG CAPS capsule Take 1 capsule (290 mcg total) by mouth daily before breakfast.   meloxicam (MOBIC) 15 MG tablet Take 1 tablet (15 mg total) by mouth  daily.   NICOTROL NS 10 MG/ML SOLN USE 1 SPRAY BY NASAL ROUTE AS NEEDED FOR NICOTINE CRAVINGS   ondansetron (ZOFRAN-ODT) 8 MG disintegrating tablet Take 1 tablet (8 mg total) by mouth every 8 (eight) hours as needed for nausea or vomiting.   pantoprazole (PROTONIX) 40 MG tablet Take 1 tablet (40 mg total) by mouth daily.   prochlorperazine (COMPAZINE) 25 MG suppository Use 1 suppository rectally every 6-8 hours as needed nausea/vomiting   QUEtiapine (SEROQUEL XR) 400 MG 24 hr tablet Take 3 tablets at bedtime (1200 mg)   QUEtiapine (SEROQUEL) 400 MG tablet Take 1 tablet (400 mg total) by mouth at bedtime.   sulfamethoxazole-trimethoprim (BACTRIM DS) 800-160 MG tablet Take 1  tablet by mouth 2 (two) times daily.   tiZANidine (ZANAFLEX) 4 MG tablet TAKE 1 TABLET EVERY 8 HOURS AS NEEDED FOR MUSCLE SPASMS.   metoCLOPramide (REGLAN) 10 MG tablet Take 1 tablet (10 mg total) by mouth every 12 (twelve) hours as needed for nausea. (Patient not taking: No sig reported)   No facility-administered encounter medications on file as of 08/22/2021.     REVIEW OF SYSTEMS  : All other systems reviewed and negative except where noted in the History of Present Illness.   PHYSICAL EXAM: BP 110/70   Ht '5\' 6"'$  (1.676 m)   Wt 182 lb 9.6 oz (82.8 kg)   BMI 29.47 kg/m  General: Well developed AA female in no acute distress Head: Normocephalic and atraumatic Eyes:  Sclerae anicteric, conjunctiva pink. Ears: Normal auditory acuity Lungs: Clear throughout to auscultation; no W/R/R. Heart: Regular rate and rhythm; no M/R/G. Abdomen: Soft, non-distended.  BS present.  Minimal LLQ TTP. Musculoskeletal: Symmetrical with no gross deformities  Skin: No lesions on visible extremities Extremities: No edema  Neurological: Alert oriented x 4, grossly non-focal Psychological:  Alert and cooperative. Normal mood and affect  ASSESSMENT AND PLAN: *Rectal bleeding secondary to internal hemorrhoids: She was just seen here 2 days ago for that along with some other complaints and scheduled for hemorrhoid banding with Dr. Fuller Plan in September.  She did not have any bleeding this morning.  I have sent a prescription for hydrocortisone suppositories for her to use at bedtime intermittently in the meantime until she comes in for her hemorrhoid banding procedure.  She was given literature on the procedure and this was discussed again with the patient.  CC:  Marianna Payment, MD

## 2021-08-22 NOTE — Patient Instructions (Addendum)
If you are age 51 or younger, your body mass index should be between 19-25. Your Body mass index is 29.47 kg/m. If this is out of the aformentioned range listed, please consider follow up with your Primary Care Provider.   __________________________________________________________  The [facility] providers would like to encourage you to use [facility] to communicate with providers for non-urgent requests or questions.  Due to long hold times on the telephone, sending your provider a message by [facility] may be a faster and more efficient way to get a response.  Please allow 48 business hours for a response.  Please remember that this is for non-urgent requests.   We have sent the following medications to your pharmacy for you to pick up at your convenience:  START: Hydrocortisone (Anusol-HC) suppository: Place 1 suppository rectally at bedtime each night for 7 days.  Please call [facility] to see if you need a referral for an appointment.  Thank you for entrusting me with your care and choosing [facility].  Alonza Bogus, PA-C

## 2021-08-27 ENCOUNTER — Other Ambulatory Visit: Payer: Self-pay | Admitting: Physician Assistant

## 2021-08-28 ENCOUNTER — Telehealth (HOSPITAL_COMMUNITY): Payer: Self-pay | Admitting: *Deleted

## 2021-08-28 ENCOUNTER — Other Ambulatory Visit (HOSPITAL_COMMUNITY): Payer: Self-pay | Admitting: Psychiatry

## 2021-08-28 DIAGNOSIS — F3170 Bipolar disorder, currently in remission, most recent episode unspecified: Secondary | ICD-10-CM

## 2021-08-28 DIAGNOSIS — F410 Panic disorder [episodic paroxysmal anxiety] without agoraphobia: Secondary | ICD-10-CM

## 2021-08-28 DIAGNOSIS — F419 Anxiety disorder, unspecified: Secondary | ICD-10-CM

## 2021-08-28 MED ORDER — QUETIAPINE FUMARATE 400 MG PO TABS: 400.0000 mg | ORAL_TABLET | Freq: Every day | ORAL | 3 refills | Status: DC

## 2021-08-28 MED ORDER — GABAPENTIN 400 MG PO CAPS: 400.0000 mg | ORAL_CAPSULE | Freq: Three times a day (TID) | ORAL | 2 refills | Status: DC

## 2021-08-28 MED ORDER — HYDROXYZINE HCL 50 MG PO TABS: 50.0000 mg | ORAL_TABLET | Freq: Three times a day (TID) | ORAL | 3 refills | Status: DC | PRN

## 2021-08-28 MED ORDER — LAMOTRIGINE 100 MG PO TABS: 100.0000 mg | ORAL_TABLET | Freq: Every day | ORAL | 3 refills | Status: DC

## 2021-08-28 NOTE — Telephone Encounter (Signed)
Medications refilled and sent to preferred pharmacy.  Provider discussed with patient that her Seroquel dose is high as recommended dosage max dosage is [phone] mg and patient is currently taking 1600 mg.  Patient notes that she likes her Seroquel and reports that she has not had any adverse effects.  Provider discussed concerns with attending MD Dr. Eduard Clos who recommended keeping patient at current dose.  No medication changes made today.  Patient agreeable to continue medication as prescribed.

## 2021-08-28 NOTE — Telephone Encounter (Signed)
Notification from [place] that she is requesting her medications be moved to [place] [facility]. Will make provider aware. Per chart she has medicines till Oct.

## 2021-08-29 ENCOUNTER — Telehealth: Payer: Self-pay | Admitting: Gastroenterology

## 2021-08-29 NOTE — Telephone Encounter (Signed)
The pt has been advised that she needs to call her PCP for a referral to GYN per her insurance.  The pt has been advised of the information and verbalized understanding.

## 2021-08-30 ENCOUNTER — Encounter: Payer: Medicare HMO | Admitting: Gastroenterology

## 2021-08-31 ENCOUNTER — Other Ambulatory Visit: Payer: Self-pay | Admitting: Student

## 2021-08-31 DIAGNOSIS — M62838 Other muscle spasm: Secondary | ICD-10-CM

## 2021-09-01 DIAGNOSIS — L03011 Cellulitis of right finger: Secondary | ICD-10-CM | POA: Diagnosis not present

## 2021-09-06 ENCOUNTER — Other Ambulatory Visit (HOSPITAL_COMMUNITY): Payer: Self-pay | Admitting: Psychiatry

## 2021-09-06 ENCOUNTER — Other Ambulatory Visit (HOSPITAL_COMMUNITY): Payer: Self-pay | Admitting: *Deleted

## 2021-09-06 ENCOUNTER — Other Ambulatory Visit: Payer: Self-pay

## 2021-09-06 DIAGNOSIS — F410 Panic disorder [episodic paroxysmal anxiety] without agoraphobia: Secondary | ICD-10-CM

## 2021-09-06 DIAGNOSIS — F419 Anxiety disorder, unspecified: Secondary | ICD-10-CM

## 2021-09-06 DIAGNOSIS — F3170 Bipolar disorder, currently in remission, most recent episode unspecified: Secondary | ICD-10-CM

## 2021-09-06 MED ORDER — MIRTAZAPINE 15 MG PO TABS: 15.0000 mg | ORAL_TABLET | Freq: Every day | ORAL | 3 refills | Status: DC

## 2021-09-07 MED ORDER — ALBUTEROL SULFATE HFA 108 (90 BASE) MCG/ACT IN AERS: 2.0000 | INHALATION_SPRAY | Freq: Four times a day (QID) | RESPIRATORY_TRACT | 0 refills | Status: AC | PRN

## 2021-09-13 ENCOUNTER — Ambulatory Visit (HOSPITAL_COMMUNITY): Payer: Medicare HMO | Admitting: Clinical

## 2021-09-14 ENCOUNTER — Encounter: Payer: Self-pay | Admitting: Gastroenterology

## 2021-09-14 ENCOUNTER — Ambulatory Visit (INDEPENDENT_AMBULATORY_CARE_PROVIDER_SITE_OTHER): Payer: Medicare HMO | Admitting: Gastroenterology

## 2021-09-14 VITALS — BP 124/82 | HR 94 | Ht 66.0 in | Wt 179.5 lb

## 2021-09-14 DIAGNOSIS — K648 Other hemorrhoids: Secondary | ICD-10-CM | POA: Diagnosis not present

## 2021-09-14 DIAGNOSIS — K642 Third degree hemorrhoids: Secondary | ICD-10-CM | POA: Diagnosis not present

## 2021-09-14 NOTE — Patient Instructions (Signed)

## 2021-09-14 NOTE — Progress Notes (Signed)
PROCEDURE NOTE: The patient presents with symptomatic bleeding grade 3 hemorrhoids, requesting rubber band ligation of their hemorrhoidal disease.  All risks, benefits and alternative forms of therapy were described and informed consent was obtained.  The anorectum was pre-medicated during digital exam with 0.125% NTG and 5% lidocaine. In the Left Lateral Decubitus position anoscopic examination revealed grade 3 hemorrhoids in the RP > RA > LL positions.  The decision was made to band the RP internal hemorrhoid, and the [place] was used to perform band ligation without complication. The first band fell off and the second band was in good position.  Digital anorectal examination was then performed to assure proper positioning of the band and to adjust the banded tissue as required. No adjustment was needed.  No complications were encountered and the patient tolerated the procedure well.  Dietary and behavioral recommendations were given and along with follow-up instructions.   Return for possible additional hemorrhoid banding in 2 - 3 weeks as required.  High fiber diet, Benefiber daily and at least 8 glasses of water daily. The patient was discharged home without pain or other post procedure problems.

## 2021-09-20 ENCOUNTER — Ambulatory Visit (INDEPENDENT_AMBULATORY_CARE_PROVIDER_SITE_OTHER): Payer: Medicare HMO | Admitting: Internal Medicine

## 2021-09-20 DIAGNOSIS — J029 Acute pharyngitis, unspecified: Secondary | ICD-10-CM

## 2021-09-20 NOTE — Progress Notes (Signed)
  [facility] Internal Medicine Residency Telephone Encounter Continuity Care Appointment  HPI:  This telephone encounter was created for [name] on 09/20/2021 for the following purpose/cc cold like.   Past Medical History:  Past Medical History:  Diagnosis Date   Anemia    06/19/21-pt has no recollection of this dx   Anxiety    Anxiety and depression    Asthma    Bipolar 1 disorder ([place])    Depression    Excessive daytime sleepiness 11/18/2017   GERD (gastroesophageal reflux disease)    Heart murmur    Hemorrhoids    Hiatal hernia    Hypertension    Internal hemorrhoids    Irritable bowel disease 2014   Toe injury 11/18/2017     ROS:  Review of system negative unless stated in the problem list or HPI.     Assessment / Plan / Recommendations:  Please see A&P under problem oriented charting for assessment of the patient's acute and chronic medical conditions.  As always, pt is advised that if symptoms worsen or new symptoms arise, they should go to an urgent care facility or to to ER for further evaluation.   Consent and Medical Decision Making:  Patient seen with Dr. Philipp Ovens This is a telephone encounter between Jasmine Jackson and Idamae Schuller on 09/20/2021 for cold like symptoms. The visit was conducted with the patient located at home and Idamae Schuller at [facility]. The patient's identity was confirmed using their DOB and current address. The patient has consented to being evaluated through a telephone encounter and understands the associated risks (an examination cannot be done and the patient may need to come in for an appointment) / benefits (allows the patient to remain at home, decreasing exposure to coronavirus). I personally spent not applicable minutes on medical discussion.

## 2021-09-28 ENCOUNTER — Ambulatory Visit (INDEPENDENT_AMBULATORY_CARE_PROVIDER_SITE_OTHER): Payer: Medicare HMO | Admitting: Student

## 2021-09-28 ENCOUNTER — Other Ambulatory Visit: Payer: Self-pay

## 2021-09-28 ENCOUNTER — Encounter: Payer: Self-pay | Admitting: Student

## 2021-09-28 ENCOUNTER — Telehealth: Payer: Self-pay | Admitting: Gastroenterology

## 2021-09-28 VITALS — BP 117/80 | HR 97 | Temp 98.9°F | Ht 65.0 in | Wt 180.6 lb

## 2021-09-28 DIAGNOSIS — R197 Diarrhea, unspecified: Secondary | ICD-10-CM

## 2021-09-28 DIAGNOSIS — I10 Essential (primary) hypertension: Secondary | ICD-10-CM | POA: Diagnosis not present

## 2021-09-28 DIAGNOSIS — Z1159 Encounter for screening for other viral diseases: Secondary | ICD-10-CM

## 2021-09-28 DIAGNOSIS — Z23 Encounter for immunization: Secondary | ICD-10-CM

## 2021-09-28 DIAGNOSIS — R630 Anorexia: Secondary | ICD-10-CM | POA: Diagnosis not present

## 2021-09-28 DIAGNOSIS — Z114 Encounter for screening for human immunodeficiency virus [HIV]: Secondary | ICD-10-CM

## 2021-09-28 DIAGNOSIS — Z0000 Encounter for general adult medical examination without abnormal findings: Secondary | ICD-10-CM

## 2021-09-28 DIAGNOSIS — R634 Abnormal weight loss: Secondary | ICD-10-CM

## 2021-09-28 DIAGNOSIS — K5900 Constipation, unspecified: Secondary | ICD-10-CM

## 2021-09-28 MED ORDER — ZOSTER VAC RECOMB ADJUVANTED 50 MCG/0.5ML IM SUSR: 0.5000 mL | Freq: Once | INTRAMUSCULAR | 0 refills | Status: AC

## 2021-09-28 NOTE — Patient Instructions (Signed)
Thank you, Ms.[name] for allowing [place] to provide your care today. Today we discussed your weight loss and your preventative health screenings.    I have ordered the following labs for you:   Lab Orders         Hepatitis C antibody         BMP8+Anion Gap         HIV antibody (with reflex)      I will call if any are abnormal. All of your labs can be accessed through "My Chart".   My Chart Access: [email or web address]  Please follow-up in 6 months or as needed  Please make sure to arrive 15 minutes prior to your next appointment. If you arrive late, you may be asked to reschedule.    We look forward to seeing you next time. Please call our clinic at [phone] if you have any questions or concerns. The best time to call is Monday-Friday from 9am-4pm, but there is someone available 24/7. If after hours or the weekend, call the main hospital number and ask for the Internal Medicine Resident On-Call. If you need medication refills, please notify your pharmacy one week in advance and they will send [place] a request.   Thank you for letting us take part in your care. Wishing you the best!  Lacinda Axon, MD 09/28/2021, 11:33 AM IM Resident, PGY-2 [facility] 41:10

## 2021-09-28 NOTE — Assessment & Plan Note (Addendum)
Patient continues to complain of weight loss. She continues to have symptoms associated with IBS such as diarrhea, bloating and constipation. She endorse loss of appetite but denies any night sweats or fevers. She has had a very comprehensive work-up for her weight loss. Laboratory studies such as CBC, CMP, TSH has been unrevealing. Recent EGD showed mild chronic inflammation of gastric mucosa but no other abnormalities.  Mammogram in March 2022 was normal. Colonoscopy August 2021 showed five sessile polyps that were removed, a single medium-sized localized angiodysplastic lesion, scattered medium-mouthed diverticula and grade III internal hemorrhoids. 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  This is likely the reason for patient's weight loss. Patient has actually gained 1 lb in the last 2 weeks.  Plan: -- Patient advised to eat multiple small meals throughout the day, drink plenty of water and increase her fiber intake. -- Continue mirtazapine 50 mg daily, which increases appetite and will help with weight gain. -- Continue Linzess 290 mcg daily -- Can consider octreotide injection if patient continues to lose weight

## 2021-09-28 NOTE — Telephone Encounter (Signed)
Inbound call from patient requesting a letter of documentation of what she is being treated for her banding and stomach issues so she can have it for court 10/11.

## 2021-09-28 NOTE — Progress Notes (Signed)
   CC: Follow-up   HPI:  [name] is a 50 y.o. female with PMH as below who presents to clinic for follow-up on her chronic medical problems. Please see problem based charting for evaluation, assessment and plan.  Past Medical History:  Diagnosis Date   Anemia    06/19/21-pt has no recollection of this dx   Anxiety    Anxiety and depression    Asthma    Bipolar 1 disorder ([place])    Depression    Excessive daytime sleepiness 11/18/2017   GERD (gastroesophageal reflux disease)    Heart murmur    Hemorrhoids    Hiatal hernia    Hypertension    Internal hemorrhoids    Irritable bowel disease 2014   Toe injury 11/18/2017    Review of Systems:  Constitutional: Negative for fever or fatigue.  Positive for weight loss Eyes: Negative for visual changes Respiratory: Negative for shortness of breath Cardiac: Negative for chest pain MSK: Negative for back pain Abdomen: Negative for abdominal pain, nausea and vomiting. Positive for constipation and diarrhea. Neuro: Negative for headache or weakness  Physical Exam: General: Pleasant, well-appearing middle-age woman.  No acute distress. Cardiac: RRR. No murmurs, rubs or gallops. No LE edema Respiratory: Lungs CTAB. No wheezing or crackles. Abdominal: Soft, symmetric and non tender. Normal BS. Skin: Warm, dry and intact without rashes or lesions Extremities: Atraumatic. Full ROM. Palpable radial and DP pulses. Neuro: A&O x 3. Moves all extremities Psych: Appropriate mood and affect.  Vitals:   09/28/21 1041  BP: 117/80  Pulse: 97  Temp: 98.9 F (37.2 C)  TempSrc: Oral  SpO2: 100%  Weight: 180 lb 9.6 oz (81.9 kg)  Height: 5\' 5"  (1.651 m)     Assessment & Plan:   See Encounters Tab for problem based charting.  Patient discussed with Dr. Emelia Salisbury, MD, MPH

## 2021-09-28 NOTE — Telephone Encounter (Signed)
Paged after hours for 2 blisters on her right forearm.  Patient seen in clinic today.  reports getting her influenza vaccine today.  Noticed some itching of her right arm a couple hours ago and noticed 2 small blisters on her arm.  Notes that the blisters popped when she hit her arm on the door.  She notes some redness and itching this with blisters.  No other rashes.  Denies dyspnea, weakness, nausea, vomiting, lip swelling, tongue swelling, throat swelling, dizziness, syncope.  She does note that she has had similar reactions after she got her COVID-vaccine.  Was seen at urgent care twice for this and notes that this resolved without intervention.  Denies history of anaphylaxis. She does not recall any other new medications, exposures, bug bites.  May be having urticaria.  Do not think she has anaphylaxis.  She does have hydroxyzine 50 mg at home.  Discussed she can try using half a tablet to help with her itching.  She will follow-up in clinic next week for follow-up.  Discussed that if she were to shortness of breath dizziness or other signs of anaphylaxis she will go to the emergency room for evaluation.

## 2021-09-28 NOTE — Assessment & Plan Note (Signed)
Received flu vaccine today Shingles prescription printed for patient to take to pharmacy.

## 2021-09-29 LAB — HEPATITIS C ANTIBODY: Hep C Virus Ab: <0.1 s/co ratio (ref 0.0–0.9)

## 2021-09-29 LAB — BMP8+ANION GAP
Anion Gap: 23 mmol/L — ABNORMAL HIGH (ref 10.0–18.0)
BUN/Creatinine Ratio: 12 (ref 9–23)
BUN: 10 mg/dL (ref 6–24)
CO2: 19 mmol/L — ABNORMAL LOW (ref 20–29)
Calcium: 10.7 mg/dL — ABNORMAL HIGH (ref 8.7–10.2)
Chloride: 98 mmol/L (ref 96–106)
Creatinine, Ser: 0.86 mg/dL (ref 0.57–1.00)
Glucose: 102 mg/dL — ABNORMAL HIGH (ref 70–99)
Potassium: 4.3 mmol/L (ref 3.5–5.2)
Sodium: 140 mmol/L (ref 134–144)
eGFR: 82 mL/min/{1.73_m2} (ref >59)

## 2021-09-29 LAB — HIV ANTIBODY (ROUTINE TESTING W REFLEX): HIV Screen 4th Generation wRfx: NONREACTIVE

## 2021-10-01 NOTE — Telephone Encounter (Signed)
I explained that Dr. Fuller Plan was not here to create a letter for her court date tomorrow.  She was provided copies of her recent OV and procedure visits.  She will keep her appointment tomorrow with Dr. Fuller Plan

## 2021-10-01 NOTE — Progress Notes (Signed)
Internal Medicine Clinic Attending  Case discussed with Dr. A[place]onsah  At the time of the visit.  We reviewed the resident's history and exam and pertinent patient test results.  I agree with the assessment, diagnosis, and plan of care documented in the resident's note.  

## 2021-10-01 NOTE — Progress Notes (Signed)
Internal Medicine Clinic Attending  Case discussed with Dr. Khan  At the time of the visit.  We reviewed the resident's history and exam and pertinent patient test results.  I agree with the assessment, diagnosis, and plan of care documented in the resident's note.  

## 2021-10-02 ENCOUNTER — Encounter: Payer: Self-pay | Admitting: Internal Medicine

## 2021-10-02 ENCOUNTER — Encounter: Payer: Self-pay | Admitting: Gastroenterology

## 2021-10-02 ENCOUNTER — Ambulatory Visit (INDEPENDENT_AMBULATORY_CARE_PROVIDER_SITE_OTHER): Payer: Medicare HMO | Admitting: Gastroenterology

## 2021-10-02 VITALS — BP 130/88 | HR 85 | Ht 65.0 in | Wt 179.1 lb

## 2021-10-02 DIAGNOSIS — K642 Third degree hemorrhoids: Secondary | ICD-10-CM

## 2021-10-02 DIAGNOSIS — K648 Other hemorrhoids: Secondary | ICD-10-CM | POA: Diagnosis not present

## 2021-10-02 NOTE — Progress Notes (Signed)
PROCEDURE NOTE: The patient presents with symptomatic bleeding grade 3 hemorrhoids, requesting rubber band ligation of their hemorrhoidal disease.  All risks, benefits and alternative forms of therapy were described and informed consent was obtained.   RP internal hemorrhoid was banded on 9/23 with substantially decreased bleeding and decreased prolapse since.   The anorectum was pre-medicated during digital exam with 0.125% NTG and 5% lidocaine.  The decision was made to band the RA internal hemorrhoid, and the [place] was used to perform band ligation without complication.  Digital anorectal examination was then performed to assure proper positioning of the band and to adjust the banded tissue as required. No adjustment was needed.  No complications were encountered and the patient tolerated the procedure well.  Dietary and behavioral recommendations were given and along with follow-up instructions.   Return for possible additional hemorrhoid banding in 2 - 3 weeks as required.  High fiber diet, Benefiber daily and at least 8 glasses of water daily. The patient was discharged home without pain or other post procedure problems.

## 2021-10-02 NOTE — Patient Instructions (Signed)

## 2021-10-16 DIAGNOSIS — R634 Abnormal weight loss: Secondary | ICD-10-CM | POA: Diagnosis not present

## 2021-10-16 DIAGNOSIS — K219 Gastro-esophageal reflux disease without esophagitis: Secondary | ICD-10-CM | POA: Diagnosis not present

## 2021-10-16 DIAGNOSIS — R140 Abdominal distension (gaseous): Secondary | ICD-10-CM | POA: Diagnosis not present

## 2021-10-16 DIAGNOSIS — R112 Nausea with vomiting, unspecified: Secondary | ICD-10-CM | POA: Diagnosis not present

## 2021-10-17 ENCOUNTER — Encounter: Payer: Medicare HMO | Admitting: Gastroenterology

## 2021-10-17 DIAGNOSIS — R634 Abnormal weight loss: Secondary | ICD-10-CM | POA: Diagnosis not present

## 2021-10-17 DIAGNOSIS — R112 Nausea with vomiting, unspecified: Secondary | ICD-10-CM | POA: Diagnosis not present

## 2021-10-22 ENCOUNTER — Telehealth: Payer: Self-pay | Admitting: Gastroenterology

## 2021-10-22 ENCOUNTER — Encounter (HOSPITAL_COMMUNITY): Payer: Self-pay | Admitting: Psychiatry

## 2021-10-22 ENCOUNTER — Telehealth (INDEPENDENT_AMBULATORY_CARE_PROVIDER_SITE_OTHER): Payer: Medicare HMO | Admitting: Psychiatry

## 2021-10-22 DIAGNOSIS — F419 Anxiety disorder, unspecified: Secondary | ICD-10-CM

## 2021-10-22 DIAGNOSIS — F3170 Bipolar disorder, currently in remission, most recent episode unspecified: Secondary | ICD-10-CM

## 2021-10-22 MED ORDER — GABAPENTIN 400 MG PO CAPS: 400.0000 mg | ORAL_CAPSULE | Freq: Three times a day (TID) | ORAL | 2 refills | Status: DC

## 2021-10-22 MED ORDER — LAMOTRIGINE 100 MG PO TABS: 100.0000 mg | ORAL_TABLET | Freq: Every day | ORAL | 3 refills | Status: DC

## 2021-10-22 MED ORDER — HYDROXYZINE HCL 50 MG PO TABS: 50.0000 mg | ORAL_TABLET | Freq: Three times a day (TID) | ORAL | 3 refills | Status: DC | PRN

## 2021-10-22 MED ORDER — QUETIAPINE FUMARATE 400 MG PO TABS: 400.0000 mg | ORAL_TABLET | Freq: Every day | ORAL | 3 refills | Status: DC

## 2021-10-22 MED ORDER — QUETIAPINE FUMARATE ER 400 MG PO TB24: ORAL_TABLET | ORAL | 3 refills | Status: DC

## 2021-10-22 MED ORDER — MIRTAZAPINE 15 MG PO TABS: 15.0000 mg | ORAL_TABLET | Freq: Every day | ORAL | 3 refills | Status: DC

## 2021-10-22 NOTE — Telephone Encounter (Signed)
Inbound call from patient requesting documentation about what is being treated for, diagnosis, and how it is being treated. She also went to digestive health on 10/25 for a second opinion.

## 2021-10-22 NOTE — Telephone Encounter (Signed)
Jasmine Jackson, Patient needs a letter for court stating her diagnosis of GERD, rapid stomach emptying, IBS, and hemorrhoidal bleeding. How she is being treated and the response to tx.  She is doing well.  Last OV was in August for rectal bleeding and has completed 3 hem banding appts. She is petitioning the court for custody of her daughters children.  There is another petitioner that states that Ms. Sherfield is unable to care for them due to her health issues.

## 2021-10-22 NOTE — Telephone Encounter (Signed)
This schedule has been bumped.  He is out for a funeral

## 2021-10-22 NOTE — Progress Notes (Signed)
[place] MD/PA/NP OP Progress Note Virtual Visit via Telephone Note  I connected with Jasmine Jackson on 10/22/21 at 11:30 AM EDT by telephone and verified that I am speaking with the correct person using two identifiers.  Location: Patient: home Provider: Clinic   I discussed the limitations, risks, security and privacy concerns of performing an evaluation and management service by telephone and the availability of in person appointments. I also discussed with the patient that there may be a patient responsible charge related to this service. The patient expressed understanding and agreed to proceed.   I provided 30 minutes of non-face-to-face time during this encounter.  10/22/2021 11:45 AM Jasmine Jackson  MRN:  [id number]  Chief Complaint: "I don't have many complaint"  HPI: 50 year old female seen for follow up psychiatric evaluation. She has a psychiatric history of Bipolar disorder, PTSD, anxiety, panic attacks, and depression. Currently she is managed on Seroquel XR 400 mg nightly, Seroquel 1200 mg nightly (increased dose of Seroquel discussed with attending physician as well as patient.  Patient denies side effects and it is agreed upon to continue Seroquel at higher dose), Mirtazapine 15 mg nightly, Lamictal 100 mg daily, gabapentin 400 mg three times daily, and Wellbutrin SR 150 mg BID. She notes her medications are effective in managing her psychiatric conditions.   Today she was unable to login virtually so her exam was done over the phone. During exam she was  pleasant, cooperative, and engaged in conversation. She informed Probation officer that she was taking care of of 3 young children ages 38, 22, and 65.  She notes that they were released in the custody of their mother and then picked up by social services.  She informed Probation officer that at this time social services does not feel the children staying with her is appropriate.  She notes that bothers her as she loves them and feels that she can properly  care for them.  She notes that she was told that because of her physical and mental health placement in her home was not a good fit.  Patient notes that she argued that she was mentally stable however this was not taken into consideration.  At this visit patient does appear mentally stable.  She has very minimal anxiety and depression.  Provider conducted a GAD-7 and patient scored a 4, at her last visit she scored a 65.  Provider also conducted a PHQ-9 and patient scored a 4, at her last visit she scored a 9.  Today she denies SI/HI/VAH, mania, or paranoia.  She does note that at times she impulsively spends money at [employer] or [place].  Patient endorses adequate sleep and appetite.  She informed Probation officer however that she has been losing weight.  She informed Probation officer that she went to see her PCP and was told that she had increased gastric emptying.  She notes that she does not hold onto food and therefore does not receive proper nutrients.    At this time no medication changes made.  Patient agreeable to continue medications as prescribed.  She will follow-up with outpatient counseling for therapy.  No other concerns at this time. Visit Diagnosis:    ICD-10-CM   1. Bipolar disorder in full remission, most recent episode unspecified type ([place])  F31.70 gabapentin (NEURONTIN) 400 MG capsule    lamoTRIgine (LAMICTAL) 100 MG tablet    mirtazapine (REMERON) 15 MG tablet    QUEtiapine (SEROQUEL XR) 400 MG 24 hr tablet    QUEtiapine (  SEROQUEL) 400 MG tablet    2. Anxiety  F41.9 gabapentin (NEURONTIN) 400 MG capsule    hydrOXYzine (ATARAX/VISTARIL) 50 MG tablet    3. Panic attacks  F41.0 hydrOXYzine (ATARAX/VISTARIL) 50 MG tablet      Past Psychiatric History: Bipolar disorder, PTSD, Anxiety, Tobacco use  Past Medical History:  Past Medical History:  Diagnosis Date   Anemia    06/19/21-pt has no recollection of this dx   Anxiety    Anxiety and depression    Asthma    Bipolar 1 disorder ([place])     Depression    Excessive daytime sleepiness 11/18/2017   GERD (gastroesophageal reflux disease)    Heart murmur    Hemorrhoids    Hiatal hernia    Hypertension    Internal hemorrhoids    Irritable bowel disease 2014   Toe injury 11/18/2017    Past Surgical History:  Procedure Laterality Date   COLONOSCOPY  2021   HEMORRHOID SURGERY     INGUINAL HERNIA REPAIR     PARTIAL HYSTERECTOMY     fibroids   TONSILLECTOMY     UPPER GASTROINTESTINAL ENDOSCOPY      Family Psychiatric History: Son autism  Family History:  Family History  Problem Relation Age of Onset   Breast cancer Maternal Aunt        x4   Stomach cancer Maternal Aunt    Prostate cancer Maternal Uncle        x2   Breast cancer Maternal Grandmother    Breast cancer Mother 23   Colon polyps Mother 61   Breast cancer Paternal Grandmother    Colon cancer Neg Hx    Esophageal cancer Neg Hx    Rectal cancer Neg Hx     Social History:  Social History   Socioeconomic History   Marital status: Single    Spouse name: Not on file   Number of children: 1   Years of education: Not on file   Highest education level: Not on file  Occupational History   Occupation: Disability  Tobacco Use   Smoking status: Every Day    Packs/day: 0.50    Years: 37.00    Pack years: 18.50    Types: Cigarettes   Smokeless tobacco: Never   Tobacco comments:    10  CIG A DAY  Vaping Use   Vaping Use: Never used  Substance and Sexual Activity   Alcohol use: No    Alcohol/week: 0.0 standard drinks   Drug use: No   Sexual activity: Yes    Birth control/protection: Condom    Comment: both  Other Topics Concern   Not on file  Social History Narrative   Not on file   Social Determinants of Health   Financial Resource Strain: Not on file  Food Insecurity: Not on file  Transportation Needs: Not on file  Physical Activity: Not on file  Stress: Not on file  Social Connections: Not on file    Allergies:  Allergies  Allergen  Reactions   Asa [Aspirin] Other (See Comments)    Heart flutters   Banana Itching    Metabolic Disorder Labs: Lab Results  Component Value Date   HGBA1C 5.4 11/16/2019   No results found for: PROLACTIN Lab Results  Component Value Date   CHOL 159 04/24/2015   TRIG 149.0 04/24/2015   HDL 40.30 04/24/2015   CHOLHDL 4 04/24/2015   VLDL 29.8 04/24/2015   LDLCALC 89 04/24/2015   Lab Results  Component  Value Date   TSH 1.06 05/29/2021   TSH 0.771 07/31/2020    Therapeutic Level Labs: No results found for: LITHIUM No results found for: VALPROATE No components found for:  CBMZ  Current Medications: Current Outpatient Medications  Medication Sig Dispense Refill   albuterol (VENTOLIN HFA) 108 (90 Base) MCG/ACT inhaler Inhale 2 puffs into the lungs every 6 (six) hours as needed for wheezing or shortness of breath. 3 each 0   AMBULATORY NON FORMULARY MEDICATION Medication Name: GI Cocktail 234ml 2% viscous lidocaine 256ml bentyl 10mg /18ml 814ml Mylanta SIG 30 ml QID prn gas and stomach pain-do not use more than 3 days in a row 1350 mL 0   amLODipine (NORVASC) 5 MG tablet TAKE 1 TABLET(5 MG) BY MOUTH DAILY 90 tablet 1   diclofenac Sodium (VOLTAREN) 1 % GEL APPLY 2 GRAMS TOPICALLY AS NEEDED 100 g 2   gabapentin (NEURONTIN) 400 MG capsule Take 1 capsule (400 mg total) by mouth 3 (three) times daily. 90 capsule 2   hydrochlorothiazide (HYDRODIURIL) 25 MG tablet TAKE 1 TABLET EVERY DAY 90 tablet 1   hydrOXYzine (ATARAX/VISTARIL) 50 MG tablet Take 1 tablet (50 mg total) by mouth 3 (three) times daily as needed. 90 tablet 3   lamoTRIgine (LAMICTAL) 100 MG tablet Take 1 tablet (100 mg total) by mouth daily. 30 tablet 3   linaclotide (LINZESS) 290 MCG CAPS capsule Take 1 capsule (290 mcg total) by mouth daily before breakfast. 90 capsule 3   meloxicam (MOBIC) 15 MG tablet Take 1 tablet (15 mg total) by mouth daily. 30 tablet 2   mirtazapine (REMERON) 15 MG tablet Take 1 tablet (15 mg total)  by mouth at bedtime. 30 tablet 3   NICOTROL NS 10 MG/ML SOLN USE 1 SPRAY BY NASAL ROUTE AS NEEDED FOR NICOTINE CRAVINGS 40 mL 0   ondansetron (ZOFRAN-ODT) 8 MG disintegrating tablet Take 1 tablet (8 mg total) by mouth every 8 (eight) hours as needed for nausea or vomiting. 30 tablet 0   pantoprazole (PROTONIX) 40 MG tablet Take 1 tablet (40 mg total) by mouth daily. 30 tablet 3   prochlorperazine (COMPAZINE) 25 MG suppository Use 1 suppository rectally every 6-8 hours as needed nausea/vomiting 12 suppository 3   QUEtiapine (SEROQUEL XR) 400 MG 24 hr tablet Take 3 tablets at bedtime (1200 mg) 90 tablet 3   QUEtiapine (SEROQUEL) 400 MG tablet Take 1 tablet (400 mg total) by mouth at bedtime. 30 tablet 3   tiZANidine (ZANAFLEX) 4 MG tablet TAKE 1 TABLET EVERY 8 HOURS AS NEEDED FOR MUSCLE SPASMS. 90 tablet 0   No current facility-administered medications for this visit.     Musculoskeletal: Strength & Muscle Tone:  Unable to assess due to telephone visit Gait & Station:  Unable to assess due to telephone visit Patient leans:  Unable to assess due to telephone visit  Psychiatric Specialty Exam: Review of Systems  There were no vitals taken for this visit.There is no height or weight on file to calculate BMI.  General Appearance:  Unable to assess due to telephone visit  Eye Contact:   Unable to assess due to telephone visit  Speech:  Clear and Coherent and Normal Rate  Volume:  Normal  Mood:  Euthymic  Affect:   Unable to assess due to telephone visit  Thought Process:  Coherent, Goal Directed, and NA  Orientation:  Full (Time, Place, and Person)  Thought Content: WDL and Logical   Suicidal Thoughts:  No  Homicidal Thoughts:  No  Memory:  Immediate;   Good Recent;   Good Remote;   Good  Judgement:  Good  Insight:  Good  Psychomotor Activity:   Unable to assess due to telephone visit  Concentration:  Concentration: Good and Attention Span: Good  Recall:  Good  Fund of Knowledge:  Good  Language: Good  Akathisia:  No  Handed:  Right  AIMS (if indicated): not done  Assets:  Communication Skills Desire for Improvement Financial Resources/Insurance Housing Physical Health Social Support  ADL's:  Intact  Cognition: WNL  Sleep:  Good   Screenings: GAD-7    Flowsheet Row Video Visit from 10/22/2021 in [facility] Video Visit from 07/16/2021 in [facility]  Total GAD-7 Score 4 18      PHQ2-9    Flowsheet Row Video Visit from 10/22/2021 in [facility] Office Visit from 09/28/2021 in [place] Video Visit from 07/16/2021 in [facility] Office Visit from 04/11/2021 in [place] Office Visit from 12/26/2020 in [facility]  PHQ-2 Total Score 1 0 4 0 0  PHQ-9 Total Score 5 8 9  -- 0        Assessment and Plan: Patient notes that her anxiety and depression has improved since her last visit.  Today she denies symptoms of mania.  No medication changes made today.  Patient agreeable to continue medication as prescribed  1. Bipolar disorder in full remission, most recent episode unspecified type ([place])  Continue- QUEtiapine (SEROQUEL XR) 400 MG 24 hr tablet; Take 3 tablets at bedtime (1200 mg)  Dispense: 90 tablet; Refill: 3 Continue- QUEtiapine (SEROQUEL) 400 MG tablet; Take 1 tablet (400 mg total) by mouth at bedtime.  Dispense: 30 tablet; Refill: 3 Continue- mirtazapine (REMERON SOL-TAB) 15 MG disintegrating tablet; DISSOLVE 1 TABLET(15 MG) ON THE TONGUE IN THE MORNING AND AT BEDTIME  Dispense: 60 tablet; Refill: 3 Continue- lamoTRIgine (LAMICTAL) 100 MG tablet; Take 1 tablet (100 mg total) by mouth daily.  Dispense: 30 tablet; Refill: 3 Continue- gabapentin (NEURONTIN) 400 MG capsule; Take 1 capsule (400 mg total) by mouth 3 (three) times daily.  Dispense: 90 capsule; Refill: 3    Anxiety  Continue- gabapentin (NEURONTIN) 400 MG capsule; Take 1 capsule (400 mg total) by mouth 3 (three) times daily.  Dispense: 90 capsule; Refill: 3 ill: 3 Continue- mirtazapine (REMERON SOL-TAB) 15 MG disintegrating tablet; DISSOLVE 1 TABLET(15 MG) ON THE TONGUE IN THE MORNING AND AT BEDTIME  Dispense: 60 tablet; Refill: 3 Continue- hydrOXYzine (ATARAX/VISTARIL) 50 MG tablet; Take 1 tablet (50 mg total) by mouth 3 (three) times daily as needed.  Dispense: 90 tablet; Refill: 3  Follow up in 3 months Follow up with therapy  Salley Slaughter, NP 10/22/2021, 11:45 AM

## 2021-10-24 ENCOUNTER — Telehealth: Payer: Self-pay

## 2021-10-24 ENCOUNTER — Encounter: Payer: Medicare HMO | Admitting: Gastroenterology

## 2021-10-24 NOTE — Telephone Encounter (Signed)
[place], [place], PA-C  [place], [place], CMA [place], please copy this into a letter format, and called patient to let her know that she can come pick up the letter at the front desk.  Apparently she is petitioning for custody of grandchildren and needed letters from her physicians.   To whom it may concern,   Jasmine Jackson is a patient of [facility].  She is followed for chronic GERD/acid reflux, dyspepsia with intermittent nausea vomiting and abdominal pain.  She has had prior colonoscopies and has undergone hemorrhoidal banding sessions x3 this summer for symptomatic internal hemorrhoids..   The above GI issues are stable and managed with medication.   Thank you,  Amy Esterwood PA-C/Dr. Lucio Edward, MD

## 2021-10-24 NOTE — Telephone Encounter (Signed)
Patient advised that letter is at front desk.

## 2021-10-24 NOTE — Telephone Encounter (Signed)
See previous phone note.  

## 2021-10-31 ENCOUNTER — Encounter: Payer: Medicare HMO | Admitting: Gastroenterology

## 2021-11-02 ENCOUNTER — Telehealth: Payer: Self-pay

## 2021-11-02 NOTE — Telephone Encounter (Signed)
Questions about taking medications. Please call pt back.

## 2021-11-02 NOTE — Telephone Encounter (Signed)
RTC, VM obtained and message left that triage nurse was returning call and to call back and ask for a nurse in triage. SChaplin, RN,BSN

## 2021-11-02 NOTE — Telephone Encounter (Signed)
Patient returned call. States an outside provider took her off bupropion as she was concerned it was leading to weight loss. Since then they have found a different reason for the weight loss. She would like to go back on bupropion for help with smoking cessation.

## 2021-11-06 ENCOUNTER — Other Ambulatory Visit: Payer: Self-pay | Admitting: Student

## 2021-11-07 ENCOUNTER — Encounter: Payer: Self-pay | Admitting: Internal Medicine

## 2021-11-08 ENCOUNTER — Encounter: Payer: Self-pay | Admitting: Gastroenterology

## 2021-11-08 ENCOUNTER — Ambulatory Visit (INDEPENDENT_AMBULATORY_CARE_PROVIDER_SITE_OTHER): Payer: Medicare HMO | Admitting: Gastroenterology

## 2021-11-08 VITALS — BP 122/88 | HR 104 | Ht 66.0 in | Wt 183.0 lb

## 2021-11-08 DIAGNOSIS — K648 Other hemorrhoids: Secondary | ICD-10-CM | POA: Diagnosis not present

## 2021-11-08 DIAGNOSIS — K642 Third degree hemorrhoids: Secondary | ICD-10-CM | POA: Diagnosis not present

## 2021-11-08 NOTE — Patient Instructions (Signed)
Please call our office in 2 weeks if you want to consider another banding.   HEMORRHOID BANDING PROCEDURE    FOLLOW-UP CARE   The procedure you have had should have been relatively painless since the banding of the area involved does not have nerve endings and there is no pain sensation.  The rubber band cuts off the blood supply to the hemorrhoid and the band may fall off as soon as 48 hours after the banding (the band may occasionally be seen in the toilet bowl following a bowel movement). You may notice a temporary feeling of fullness in the rectum which should respond adequately to plain Tylenol or Motrin.  Following the banding, avoid strenuous exercise that evening and resume full activity the next day.  A sitz bath (soaking in a warm tub) or bidet is soothing, and can be useful for cleansing the area after bowel movements.     To avoid constipation, take two tablespoons of natural wheat bran, natural oat bran, flax, Benefiber or any over the counter fiber supplement and increase your water intake to 7-8 glasses daily.    Unless you have been prescribed anorectal medication, do not put anything inside your rectum for two weeks: No suppositories, enemas, fingers, etc.  Occasionally, you may have more bleeding than usual after the banding procedure.  This is often from the untreated hemorrhoids rather than the treated one.  Don't be concerned if there is a tablespoon or so of blood.  If there is more blood than this, lie flat with your bottom higher than your head and apply an ice pack to the area. If the bleeding does not stop within a half an hour or if you feel faint, call our office at [phone] or go to the emergency room.  Problems are not common; however, if there is a substantial amount of bleeding, severe pain, chills, fever or difficulty passing urine (very rare) or other problems, you should call us at [phone] or report to the nearest emergency room.  Do not stay  seated continuously for more than 2-3 hours for a day or two after the procedure.  Tighten your buttock muscles 10-15 times every two hours and take 10-15 deep breaths every 1-2 hours.  Do not spend more than a few minutes on the toilet if you cannot empty your bowel; instead re-visit the toilet at a later time.   Thank you for choosing me and [facility].  Pricilla Riffle. Dagoberto Ligas., MD., [place] Regal

## 2021-11-08 NOTE — Progress Notes (Signed)
PROCEDURE NOTE: The patient presents with symptomatic bleeding grade 3 hemorrhoids, requesting rubber band ligation of their hemorrhoidal disease.  All risks, benefits and alternative forms of therapy were described and informed consent was obtained. She strained to help lift her uncle into his wheelchair and unfortunately mild prolapse and bleeding returned.   RP and RA hemorrhoids were previously banded  The anorectum was pre-medicated during digital exam with 0.125% NTG and 5% lidocaine.  The decision was made to band the LL internal hemorrhoid, and the [place] was used to perform band ligation without complication.  Digital anorectal examination was then performed to assure proper positioning of the band and to adjust the banded tissue as required. No adjustment was needed.  No complications were encountered and the patient tolerated the procedure well.  Dietary and behavioral recommendations were given and along with follow-up instructions.   If hemorrhoid symptoms persist she is advised to call to schedule anoscopy and possibly an additional hemorrhoid banding in 2 - 3 weeks.  High fiber diet, Benefiber daily and at least 8 glasses of water daily. The patient was discharged home without pain or other post procedure problems.

## 2021-11-11 ENCOUNTER — Other Ambulatory Visit: Payer: Self-pay | Admitting: Physician Assistant

## 2021-11-11 ENCOUNTER — Other Ambulatory Visit (HOSPITAL_COMMUNITY): Payer: Self-pay | Admitting: Psychiatry

## 2021-11-12 ENCOUNTER — Ambulatory Visit (HOSPITAL_COMMUNITY): Payer: Medicare HMO | Admitting: Clinical

## 2021-11-13 ENCOUNTER — Telehealth: Payer: Medicare HMO | Admitting: Internal Medicine

## 2021-11-14 ENCOUNTER — Other Ambulatory Visit (HOSPITAL_COMMUNITY): Payer: Self-pay | Admitting: Psychiatry

## 2021-11-14 DIAGNOSIS — F3170 Bipolar disorder, currently in remission, most recent episode unspecified: Secondary | ICD-10-CM

## 2021-11-20 ENCOUNTER — Other Ambulatory Visit: Payer: Self-pay

## 2021-11-20 MED ORDER — ONDANSETRON 8 MG PO TBDP: 8.0000 mg | ORAL_TABLET | Freq: Three times a day (TID) | ORAL | 0 refills | Status: DC | PRN

## 2021-11-20 MED ORDER — NICOTROL NS 10 MG/ML NA SOLN: NASAL | 0 refills | Status: DC

## 2021-11-20 NOTE — Telephone Encounter (Signed)
Nicotine rx was refilled as "Print";  re-send  rx electronically (Normal). Thanks

## 2021-11-20 NOTE — Addendum Note (Signed)
Addended by: Ebbie Latus on: 11/20/2021 01:53 PM   Modules accepted: Orders

## 2021-11-22 ENCOUNTER — Ambulatory Visit (INDEPENDENT_AMBULATORY_CARE_PROVIDER_SITE_OTHER): Payer: Medicare HMO | Admitting: Internal Medicine

## 2021-11-22 DIAGNOSIS — I10 Essential (primary) hypertension: Secondary | ICD-10-CM

## 2021-11-22 MED ORDER — NICOTROL NS 10 MG/ML NA SOLN: NASAL | 0 refills | Status: DC

## 2021-11-22 NOTE — Progress Notes (Signed)
I attempted to reach Assunta Curtis for her telehealth visit but was unable to get through. I left a HIPPA compliant VM.  Lawerance Cruel, D.O.  Internal Medicine Resident, PGY-3 [facility]

## 2021-11-23 ENCOUNTER — Encounter: Payer: Self-pay | Admitting: Internal Medicine

## 2021-11-26 ENCOUNTER — Telehealth: Payer: Self-pay

## 2021-11-26 DIAGNOSIS — F17200 Nicotine dependence, unspecified, uncomplicated: Secondary | ICD-10-CM

## 2021-11-26 MED ORDER — NICOTROL NS 10 MG/ML NA SOLN: NASAL | 0 refills | Status: DC

## 2021-11-29 ENCOUNTER — Other Ambulatory Visit: Payer: Self-pay | Admitting: Internal Medicine

## 2021-11-29 NOTE — Telephone Encounter (Signed)
Last visit was 09/28/2021.  No further visits scheduled.

## 2021-11-29 NOTE — Telephone Encounter (Signed)
I called [facility] to give VO for Nicotrol NS. They stated insurance  require the max # of sprays per day - I will ask Dr Marianna Payment to re-send rx with this information.

## 2021-12-18 ENCOUNTER — Other Ambulatory Visit: Payer: Self-pay | Admitting: Podiatry

## 2021-12-18 NOTE — Telephone Encounter (Signed)
Please advise 

## 2021-12-19 NOTE — Telephone Encounter (Signed)
I'll refill but should be seen by family doc for future prescripton

## 2021-12-24 ENCOUNTER — Other Ambulatory Visit: Payer: Self-pay | Admitting: Gastroenterology

## 2021-12-26 ENCOUNTER — Ambulatory Visit: Payer: Medicare HMO | Admitting: Podiatry

## 2021-12-31 ENCOUNTER — Telehealth: Payer: Self-pay | Admitting: Gastroenterology

## 2021-12-31 MED ORDER — LINACLOTIDE 290 MCG PO CAPS: 290.0000 ug | ORAL_CAPSULE | Freq: Every day | ORAL | 1 refills | Status: DC

## 2021-12-31 NOTE — Telephone Encounter (Signed)
Prescription sent to patient's pharmacy.

## 2021-12-31 NOTE — Telephone Encounter (Signed)
Inbound call from patient states she need refill for Linzess

## 2022-01-14 ENCOUNTER — Telehealth: Payer: Self-pay | Admitting: Gastroenterology

## 2022-01-14 NOTE — Telephone Encounter (Signed)
Patient called states she has had four BM's today and every time she had bright red blood in her stool seeking advise.

## 2022-01-15 NOTE — Telephone Encounter (Signed)
Patient calling to schedule a hemorrhoid banding.  She will come in on 1/26 at 11:10

## 2022-01-16 ENCOUNTER — Telehealth (INDEPENDENT_AMBULATORY_CARE_PROVIDER_SITE_OTHER): Payer: Medicare HMO | Admitting: Psychiatry

## 2022-01-16 ENCOUNTER — Encounter (HOSPITAL_COMMUNITY): Payer: Self-pay | Admitting: Psychiatry

## 2022-01-16 DIAGNOSIS — F419 Anxiety disorder, unspecified: Secondary | ICD-10-CM

## 2022-01-16 DIAGNOSIS — F3170 Bipolar disorder, currently in remission, most recent episode unspecified: Secondary | ICD-10-CM

## 2022-01-16 MED ORDER — LAMOTRIGINE 100 MG PO TABS: 100.0000 mg | ORAL_TABLET | Freq: Every day | ORAL | 3 refills | Status: DC

## 2022-01-16 MED ORDER — BUPROPION HCL ER (SR) 150 MG PO TB12: 150.0000 mg | ORAL_TABLET | Freq: Two times a day (BID) | ORAL | 3 refills | Status: DC

## 2022-01-16 MED ORDER — QUETIAPINE FUMARATE 400 MG PO TABS: 400.0000 mg | ORAL_TABLET | Freq: Every day | ORAL | 3 refills | Status: DC

## 2022-01-16 MED ORDER — MIRTAZAPINE 15 MG PO TABS: 15.0000 mg | ORAL_TABLET | Freq: Every day | ORAL | 3 refills | Status: DC

## 2022-01-16 MED ORDER — HYDROXYZINE HCL 50 MG PO TABS: 50.0000 mg | ORAL_TABLET | Freq: Three times a day (TID) | ORAL | 3 refills | Status: DC | PRN

## 2022-01-16 MED ORDER — QUETIAPINE FUMARATE ER 400 MG PO TB24: ORAL_TABLET | ORAL | 3 refills | Status: DC

## 2022-01-16 MED ORDER — GABAPENTIN 400 MG PO CAPS: 400.0000 mg | ORAL_CAPSULE | Freq: Three times a day (TID) | ORAL | 2 refills | Status: DC

## 2022-01-16 NOTE — Progress Notes (Signed)
[facility] MD/PA/NP OP Progress Note Virtual Visit via Telephone Note  I connected with Jasmine Jackson on 01/16/22 at 11:00 AM EST by telephone and verified that I am speaking with the correct person using two identifiers.  Location: Patient: home Provider: Clinic   I discussed the limitations, risks, security and privacy concerns of performing an evaluation and management service by telephone and the availability of in person appointments. I also discussed with the patient that there may be a patient responsible charge related to this service. The patient expressed understanding and agreed to proceed.   I provided 30 minutes of non-face-to-face time during this encounter.  01/16/2022 12:10 PM Jasmine Jackson  MRN:  [id number]  Chief Complaint: "Things have been pretty good but my mother bothers me"  HPI: 51 year old female seen for follow up psychiatric evaluation. She has a psychiatric history of Bipolar disorder, PTSD, anxiety, panic attacks, and depression. Currently she is managed on Seroquel XR 400 mg nightly, Seroquel 1200 mg nightly (increased dose of Seroquel discussed with attending physician as well as patient.  Patient denies side effects and it is agreed upon to continue Seroquel at higher dose), Mirtazapine 15 mg nightly, Lamictal 100 mg daily, gabapentin 400 mg three times daily, and Wellbutrin SR 150 mg BID. She notes her medications are effective in managing her psychiatric conditions.   Today she was unable to login virtually so her exam was done over the phone. During exam she was  pleasant, cooperative, and engaged in conversation. She informed Probation officer that she has been feeling pretty good but notes that her mother has been bothering her. She notes that she feels her mother has dementia. She reports that her mothers doctor  informed her that she has memory loss and requires further assistance but her mother denies it. She reports that her mother calls her frequently to say she messed  up her medications.  She also notes that at times her mom presents with tactile hallucinations of things crawling on her or in her bed.  She reports that her mother's health is becoming overwhelming.  Provider recommended patient seek assistance from a home health agency or her mother's doctor.  She endorsed understanding and agreed.    Patient notes that despite the stressors she continues to have minimal anxiety and depression and reports that her mood is stable.  Provider conducted a GAD-7 and patient scored a 2, at her last visit she scored a 4.  Provider also conducted a PHQ-9 and patient scored a 1, at her last visit she scored a 4.  She endorses adequate sleep and appetite.  Patient notes recently she has been taking Protonix and reports that she no longer has acid reflux or vomiting.  She notes that she is able to enjoy her food.  Since her last visit she has gained approximately 8 pounds.  Today she denies SI/HI/VAH, mania, or paranoia.  Patient reports that she will be seeking custody of children she wants cared for.  She requested a letter from provider of her mental health stability.  Provider was agreeable to this request.  No medication changes made today.  Patient agreeable to continue medications as prescribed.  She will follow with outpatient counseling for therapy.  No other concerns at this time   Visit Diagnosis:    ICD-10-CM   1. Bipolar disorder in full remission, most recent episode unspecified type ([place])  F31.70 gabapentin (NEURONTIN) 400 MG capsule    lamoTRIgine (LAMICTAL) 100 MG tablet  mirtazapine (REMERON) 15 MG tablet    QUEtiapine (SEROQUEL XR) 400 MG 24 hr tablet    QUEtiapine (SEROQUEL) 400 MG tablet    2. Anxiety  F41.9 gabapentin (NEURONTIN) 400 MG capsule    hydrOXYzine (ATARAX) 50 MG tablet    mirtazapine (REMERON) 15 MG tablet      Past Psychiatric History: Bipolar disorder, PTSD, Anxiety, Tobacco use  Past Medical History:  Past Medical History:   Diagnosis Date   Anemia    06/19/21-pt has no recollection of this dx   Anxiety    Anxiety and depression    Asthma    Bipolar 1 disorder ([place])    Depression    Excessive daytime sleepiness 11/18/2017   GERD (gastroesophageal reflux disease)    Heart murmur    Hemorrhoids    Hiatal hernia    Hypertension    Internal hemorrhoids    Irritable bowel disease 2014   Toe injury 11/18/2017    Past Surgical History:  Procedure Laterality Date   COLONOSCOPY  2021   HEMORRHOID SURGERY     INGUINAL HERNIA REPAIR     PARTIAL HYSTERECTOMY     fibroids   TONSILLECTOMY     UPPER GASTROINTESTINAL ENDOSCOPY      Family Psychiatric History: Son autism  Family History:  Family History  Problem Relation Age of Onset   Breast cancer Maternal Aunt        x4   Stomach cancer Maternal Aunt    Prostate cancer Maternal Uncle        x2   Breast cancer Maternal Grandmother    Breast cancer Mother 75   Colon polyps Mother 42   Breast cancer Paternal Grandmother    Colon cancer Neg Hx    Esophageal cancer Neg Hx    Rectal cancer Neg Hx     Social History:  Social History   Socioeconomic History   Marital status: Single    Spouse name: Not on file   Number of children: 1   Years of education: Not on file   Highest education level: Not on file  Occupational History   Occupation: Disability  Tobacco Use   Smoking status: Every Day    Packs/day: 0.50    Years: 37.00    Pack years: 18.50    Types: Cigarettes   Smokeless tobacco: Never   Tobacco comments:    10  CIG A DAY  Vaping Use   Vaping Use: Never used  Substance and Sexual Activity   Alcohol use: No    Alcohol/week: 0.0 standard drinks   Drug use: No   Sexual activity: Yes    Birth control/protection: Condom    Comment: both  Other Topics Concern   Not on file  Social History Narrative   Not on file   Social Determinants of Health   Financial Resource Strain: Not on file  Food Insecurity: Not on file   Transportation Needs: Not on file  Physical Activity: Not on file  Stress: Not on file  Social Connections: Not on file    Allergies:  Allergies  Allergen Reactions   Asa [Aspirin] Other (See Comments)    Heart flutters   Banana Itching    Metabolic Disorder Labs: Lab Results  Component Value Date   HGBA1C 5.4 11/16/2019   No results found for: PROLACTIN Lab Results  Component Value Date   CHOL 159 04/24/2015   TRIG 149.0 04/24/2015   HDL 40.30 04/24/2015   CHOLHDL 4 04/24/2015  VLDL 29.8 04/24/2015   LDLCALC 89 04/24/2015   Lab Results  Component Value Date   TSH 1.06 05/29/2021   TSH 0.771 07/31/2020    Therapeutic Level Labs: No results found for: LITHIUM No results found for: VALPROATE No components found for:  CBMZ  Current Medications: Current Outpatient Medications  Medication Sig Dispense Refill   albuterol (VENTOLIN HFA) 108 (90 Base) MCG/ACT inhaler Inhale 2 puffs into the lungs every 6 (six) hours as needed for wheezing or shortness of breath. 3 each 0   AMBULATORY NON FORMULARY MEDICATION Medication Name: GI Cocktail 275ml 2% viscous lidocaine 290ml bentyl 10mg /3ml 861ml Mylanta SIG 30 ml QID prn gas and stomach pain-do not use more than 3 days in a row 1350 mL 0   amLODipine (NORVASC) 5 MG tablet TAKE 1 TABLET EVERY DAY 90 tablet 1   buPROPion (WELLBUTRIN SR) 150 MG 12 hr tablet Take 1 tablet (150 mg total) by mouth 2 (two) times daily. 180 tablet 3   diclofenac Sodium (VOLTAREN) 1 % GEL APPLY 2 GRAMS TOPICALLY AS NEEDED 100 g 2   gabapentin (NEURONTIN) 400 MG capsule Take 1 capsule (400 mg total) by mouth 3 (three) times daily. 90 capsule 2   hydrochlorothiazide (HYDRODIURIL) 25 MG tablet TAKE 1 TABLET EVERY DAY 90 tablet 1   hydrOXYzine (ATARAX) 50 MG tablet Take 1 tablet (50 mg total) by mouth 3 (three) times daily as needed. 90 tablet 3   lamoTRIgine (LAMICTAL) 100 MG tablet Take 1 tablet (100 mg total) by mouth daily. 90 tablet 3   linaclotide  (LINZESS) 290 MCG CAPS capsule Take 1 capsule (290 mcg total) by mouth daily before breakfast. 90 capsule 1   meloxicam (MOBIC) 15 MG tablet TAKE 1 TABLET(15 MG) BY MOUTH DAILY 30 tablet 2   mirtazapine (REMERON) 15 MG tablet Take 1 tablet (15 mg total) by mouth at bedtime. 30 tablet 3   Nicotine (NICOTROL NS) 10 MG/ML SOLN USE 1 SPRAY BY NASAL ROUTE AS NEEDED FOR NICOTINE CRAVINGS Strength: 10 mg/mL 40 mL 0   ondansetron (ZOFRAN-ODT) 8 MG disintegrating tablet Take 1 tablet (8 mg total) by mouth every 8 (eight) hours as needed for nausea or vomiting. 30 tablet 0   pantoprazole (PROTONIX) 40 MG tablet Take 1 tablet (40 mg total) by mouth daily. 30 tablet 3   prochlorperazine (COMPAZINE) 25 MG suppository Use 1 suppository rectally every 6-8 hours as needed nausea/vomiting 12 suppository 3   QUEtiapine (SEROQUEL XR) 400 MG 24 hr tablet Take 3 tablets at bedtime (1200 mg) 90 tablet 3   QUEtiapine (SEROQUEL) 400 MG tablet Take 1 tablet (400 mg total) by mouth at bedtime. 90 tablet 3   tiZANidine (ZANAFLEX) 4 MG tablet TAKE 1 TABLET EVERY 8 HOURS AS NEEDED FOR MUSCLE SPASMS. 90 tablet 0   No current facility-administered medications for this visit.     Musculoskeletal: Strength & Muscle Tone:  Unable to assess due to telephone visit Gait & Station:  Unable to assess due to telephone visit Patient leans:  Unable to assess due to telephone visit  Psychiatric Specialty Exam: Review of Systems  There were no vitals taken for this visit.There is no height or weight on file to calculate BMI.  General Appearance:  Unable to assess due to telephone visit  Eye Contact:   Unable to assess due to telephone visit  Speech:  Clear and Coherent and Normal Rate  Volume:  Normal  Mood:  Euthymic  Affect:   Unable to  assess due to telephone visit  Thought Process:  Coherent, Goal Directed, and NA  Orientation:  Full (Time, Place, and Person)  Thought Content: WDL and Logical   Suicidal Thoughts:  No   Homicidal Thoughts:  No  Memory:  Immediate;   Good Recent;   Good Remote;   Good  Judgement:  Good  Insight:  Good  Psychomotor Activity:   Unable to assess due to telephone visit  Concentration:  Concentration: Good and Attention Span: Good  Recall:  Good  Fund of Knowledge: Good  Language: Good  Akathisia:  No  Handed:  Right  AIMS (if indicated): not done  Assets:  Communication Skills Desire for Improvement Financial Resources/Insurance Housing Physical Health Social Support  ADL's:  Intact  Cognition: WNL  Sleep:  Good   Screenings: GAD-7    Flowsheet Row Video Visit from 01/16/2022 in [facility] Video Visit from 10/22/2021 in [facility] Video Visit from 07/16/2021 in [facility]  Total GAD-7 Score 2 4 18       PHQ2-9    Flowsheet Row Video Visit from 01/16/2022 in [facility] Video Visit from 10/22/2021 in [facility] Office Visit from 09/28/2021 in [place] Video Visit from 07/16/2021 in [facility] Office Visit from 04/11/2021 in [place]  PHQ-2 Total Score 0 1 0 4 0  PHQ-9 Total Score 1 5 8 9  --        Assessment and Plan: Patient notes that her anxiety, depression, and mood continues to be stable. She reports that her mothers health is overwhelming at times.  No medication changes made today.  Patient agreeable to continue medication as prescribed.   1. Bipolar disorder in full remission, most recent episode unspecified type ([place])  Continue- QUEtiapine (SEROQUEL XR) 400 MG 24 hr tablet; Take 3 tablets at bedtime (1200 mg)  Dispense: 90 tablet; Refill: 3 Continue- QUEtiapine (SEROQUEL) 400 MG tablet; Take 1 tablet (400 mg total) by mouth at bedtime.  Dispense: 30 tablet; Refill: 3 Continue- mirtazapine (REMERON SOL-TAB) 15 MG  disintegrating tablet; DISSOLVE 1 TABLET(15 MG) ON THE TONGUE IN THE MORNING AND AT BEDTIME  Dispense: 60 tablet; Refill: 3 Continue- lamoTRIgine (LAMICTAL) 100 MG tablet; Take 1 tablet (100 mg total) by mouth daily.  Dispense: 30 tablet; Refill: 3 Continue- gabapentin (NEURONTIN) 400 MG capsule; Take 1 capsule (400 mg total) by mouth 3 (three) times daily.  Dispense: 90 capsule; Refill: 3   Anxiety  Continue- gabapentin (NEURONTIN) 400 MG capsule; Take 1 capsule (400 mg total) by mouth 3 (three) times daily.  Dispense: 90 capsule; Refill: 3 ill: 3 Continue- mirtazapine (REMERON SOL-TAB) 15 MG disintegrating tablet; DISSOLVE 1 TABLET(15 MG) ON THE TONGUE IN THE MORNING AND AT BEDTIME  Dispense: 60 tablet; Refill: 3 Continue- hydrOXYzine (ATARAX/VISTARIL) 50 MG tablet; Take 1 tablet (50 mg total) by mouth 3 (three) times daily as needed.  Dispense: 90 tablet; Refill:3  .diagme  Follow up in 3 months Follow up with therapy  Salley Slaughter, NP 01/16/2022, 12:10 PM

## 2022-01-17 ENCOUNTER — Ambulatory Visit (INDEPENDENT_AMBULATORY_CARE_PROVIDER_SITE_OTHER): Payer: Medicare HMO | Admitting: Gastroenterology

## 2022-01-17 ENCOUNTER — Encounter: Payer: Self-pay | Admitting: Gastroenterology

## 2022-01-17 VITALS — BP 122/74 | HR 97 | Ht 66.0 in | Wt 204.5 lb

## 2022-01-17 DIAGNOSIS — K642 Third degree hemorrhoids: Secondary | ICD-10-CM | POA: Diagnosis not present

## 2022-01-17 DIAGNOSIS — K581 Irritable bowel syndrome with constipation: Secondary | ICD-10-CM

## 2022-01-17 NOTE — Patient Instructions (Addendum)
If you are age 52 or younger, your body mass index should be between 19-25. Your Body mass index is 33.01 kg/m. If this is out of the aformentioned range listed, please consider follow up with your Primary Care Provider.   ________________________________________________________  The [place] providers would like to encourage you to use [facility] to communicate with providers for non-urgent requests or questions.  Due to long hold times on the telephone, sending your provider a message by [facility] may be a faster and more efficient way to get a response.  Please allow 48 business hours for a response.  Please remember that this is for non-urgent requests.  _______________________________________________________  Please purchase the following medications over the counter and take as directed:  Use Recitcare as directed on box Preparation H Suppository twice daily as needed Preparation H Cream use externally to area twice daily as needed.   Please make a decision on whether you will continue your care here at [facility] or with [facility].  Thank you for entrusting me with your care and choosing [facility].  Dr Fuller Plan

## 2022-01-17 NOTE — Progress Notes (Signed)
° ° °  History of Present Illness: This is a 51 year old female complaining of small-volume rectal bleeding and hemorrhoid prolapse.  She underwent hemorrhoidal banding for grade 3 internal hemorrhoids in the fall 2022.  She had no prolapse or bleeding up until several days ago when she noticed a very small amount of bright red blood with a bowel movement and this persisted for the next 4 bowel movements.  She also noted slight prolapse which was easily reducible.  It has now come to our attention that she established care with Jamey Reas, MD with [place] [facility] in October 2022 as a second opinion to evaluate weight loss, nausea, vomiting, abdominal bloating.  Current Medications, Allergies, Past Medical History, Past Surgical History, Family History and Social History were reviewed in [employer] record.   Physical Exam: General: Well developed, well nourished, no acute distress Head: Normocephalic and atraumatic Eyes: Sclerae anicteric, EOMI Ears: Normal auditory acuity Mouth: Not examined, mask on during Covid-19 pandemic Lungs: Clear throughout to auscultation Heart: Regular rate and rhythm; no murmurs, rubs or bruits Abdomen: Soft, non tender and non distended. No masses, hepatosplenomegaly or hernias noted. Normal Bowel sounds Rectal: Small prolapsed internal hemorrhoid which was easily reduced, no other lesions, no tenderness, heme negative brown stool in the vault Musculoskeletal: Symmetrical with no gross deformities  Pulses:  Normal pulses noted Extremities: No clubbing, cyanosis, edema or deformities noted Neurological: Alert oriented x 4, grossly nonfocal Psychological:  Alert and cooperative. Normal mood and affect   Assessment and Recommendations:  Grade 3 internal hemorrhoids with minor recurrent bleeding and minor recurrent prolapse.  Unfortunately mild symptoms have recurred shortly after completion of banding.   Standard rectal care instructions.  Begin Preparation H suppositories twice daily as needed and Preparation H cream twice daily as needed.  If her hemorrhoid prolapse or bleeding persists consider repeat banding or surgical referral as her hemorrhoid symptoms recurred shortly after the completion of hemorrhoid banding.  See #4. IBS-C, symptoms under good control on Linzess 290 mcg daily.  See #4. GERD, nonobstructive Schatzki ring, small hiatal hernia.  Follow antireflux measures and continue pantoprazole 40 mg p.o. twice daily.  See #4. Rapid gastric emptying, possible SIBO, work-up in progress by Dr. Oren Beckmann. We discussed ongoing GI care with 1 local gastroenterologist, not 2.  She will decide whether she wants ongoing her GI care with me or Dr. Oren Beckmann.

## 2022-01-22 ENCOUNTER — Telehealth (HOSPITAL_COMMUNITY): Payer: Self-pay | Admitting: Psychiatry

## 2022-01-22 ENCOUNTER — Other Ambulatory Visit (HOSPITAL_COMMUNITY): Payer: Self-pay | Admitting: Psychiatry

## 2022-01-22 DIAGNOSIS — F3170 Bipolar disorder, currently in remission, most recent episode unspecified: Secondary | ICD-10-CM

## 2022-01-22 DIAGNOSIS — F419 Anxiety disorder, unspecified: Secondary | ICD-10-CM

## 2022-01-22 MED ORDER — MIRTAZAPINE 15 MG PO TBDP: 15.0000 mg | ORAL_TABLET | Freq: Every day | ORAL | 3 refills | Status: DC

## 2022-01-22 NOTE — Telephone Encounter (Signed)
Medication refilled and sent to preferred pharmacy.  Provider also wrote patient's letter at last visit.  She can come into the clinic and pick it up at her convenience.  Provider attempted to call patient twice without success however left a voicemail detailing the above.  Writer instructed patient to come and or call clinic if further assistance is needed.

## 2022-01-22 NOTE — Telephone Encounter (Signed)
Patient contacted office regarding medications. Remeron 15mg  pills need to be changed to the dissolving tablets. She is also requesting a letter from the provider stating how long she has been under her care and that she is currently stable on her medication regimen. Patient would like letter printed for pick up if possible.

## 2022-02-01 ENCOUNTER — Encounter: Payer: Self-pay | Admitting: Internal Medicine

## 2022-02-03 ENCOUNTER — Other Ambulatory Visit: Payer: Self-pay | Admitting: Student

## 2022-02-04 ENCOUNTER — Telehealth (HOSPITAL_COMMUNITY): Payer: Self-pay | Admitting: Psychiatry

## 2022-02-04 ENCOUNTER — Encounter (HOSPITAL_COMMUNITY): Payer: Self-pay | Admitting: Psychiatry

## 2022-02-04 NOTE — Telephone Encounter (Signed)
Provider called patient and was informed that she will be going to court soon.  She asked provider if she could take her mental health diagnoses of the letter.  Provider was agreeable to this.  No other concerns noted at this time.

## 2022-02-04 NOTE — Telephone Encounter (Signed)
Pt called requesting total of 2 letters. The one written by provider on 01/16/22 she plans to use as back up if necessary in court.  In addition, pt requesting additional letter that does not have her diagnoses in the letter, but states she keeps appts and takes meds as prescribed  and is stable. Please contact pt [phone].

## 2022-02-05 ENCOUNTER — Telehealth: Payer: Self-pay | Admitting: Gastroenterology

## 2022-02-05 NOTE — Telephone Encounter (Signed)
Patient is calling to ask for another script for the GI cocktail please call her to advise.

## 2022-02-05 NOTE — Telephone Encounter (Signed)
Patient is requesting refill of GI cocktail. Please advise if you want to refill.

## 2022-02-06 MED ORDER — AMBULATORY NON FORMULARY MEDICATION: 2 refills | Status: DC

## 2022-02-06 MED ORDER — ONDANSETRON 8 MG PO TBDP: 8.0000 mg | ORAL_TABLET | Freq: Three times a day (TID) | ORAL | 0 refills | Status: DC | PRN

## 2022-02-06 NOTE — Telephone Encounter (Signed)
Prescription printed and faxed to [place] where the prescription has previously been sent.

## 2022-02-13 ENCOUNTER — Encounter: Payer: Medicare HMO | Admitting: Gastroenterology

## 2022-02-14 ENCOUNTER — Encounter: Payer: Medicare HMO | Admitting: Gastroenterology

## 2022-02-14 DIAGNOSIS — R634 Abnormal weight loss: Secondary | ICD-10-CM | POA: Diagnosis not present

## 2022-02-14 DIAGNOSIS — R142 Eructation: Secondary | ICD-10-CM | POA: Diagnosis not present

## 2022-02-14 DIAGNOSIS — R112 Nausea with vomiting, unspecified: Secondary | ICD-10-CM | POA: Diagnosis not present

## 2022-02-14 DIAGNOSIS — K5900 Constipation, unspecified: Secondary | ICD-10-CM | POA: Diagnosis not present

## 2022-02-14 DIAGNOSIS — K6389 Other specified diseases of intestine: Secondary | ICD-10-CM | POA: Diagnosis not present

## 2022-02-14 DIAGNOSIS — R140 Abdominal distension (gaseous): Secondary | ICD-10-CM | POA: Diagnosis not present

## 2022-02-14 DIAGNOSIS — R1084 Generalized abdominal pain: Secondary | ICD-10-CM | POA: Diagnosis not present

## 2022-02-14 DIAGNOSIS — R195 Other fecal abnormalities: Secondary | ICD-10-CM | POA: Diagnosis not present

## 2022-03-01 ENCOUNTER — Telehealth (HOSPITAL_COMMUNITY): Payer: Self-pay | Admitting: Psychiatry

## 2022-03-01 NOTE — Telephone Encounter (Signed)
Pt calling to speak with provider about the Letter provider wrote to the judge. Please call pt [phone]

## 2022-03-06 ENCOUNTER — Encounter: Payer: Self-pay | Admitting: Internal Medicine

## 2022-03-06 ENCOUNTER — Telehealth: Payer: Self-pay | Admitting: Gastroenterology

## 2022-03-06 MED ORDER — PANTOPRAZOLE SODIUM 40 MG PO TBEC: 40.0000 mg | DELAYED_RELEASE_TABLET | Freq: Two times a day (BID) | ORAL | 3 refills | Status: DC

## 2022-03-06 NOTE — Telephone Encounter (Signed)
Patient called states she went to [facility] for a second opinion and is inquiring about still being able to get the Protonix medication please advise. ?

## 2022-03-06 NOTE — Telephone Encounter (Signed)
OK to refill pantoprazole for 1 year.  ?

## 2022-03-06 NOTE — Telephone Encounter (Signed)
Prescription for pantoprazole sent to [facility] 90 day supply per patient's request.  ?

## 2022-03-06 NOTE — Telephone Encounter (Signed)
Patient states she went to [facility] for a second opinion prior to her last hemorrhoid banding with Dr. Fuller Plan. Patient states she was told by Dr. Fuller Plan to pick one local gastroenterologist and not two and that she could continue to have the breath test that was already scheduled by [facility]. Patient states she does want to continue her GI care with our office and was wondering if we can refill her Protonix. Informed patient I will send this message to Dr. Fuller Plan to confirm that I can go ahead and refill prescription even though she had had the breath test by [facility]. Patient understood. Please advise Dr. Fuller Plan.  ?

## 2022-03-27 ENCOUNTER — Encounter: Payer: Self-pay | Admitting: Internal Medicine

## 2022-03-27 ENCOUNTER — Ambulatory Visit (INDEPENDENT_AMBULATORY_CARE_PROVIDER_SITE_OTHER): Payer: Medicare HMO | Admitting: Internal Medicine

## 2022-03-27 ENCOUNTER — Other Ambulatory Visit: Payer: Self-pay

## 2022-03-27 DIAGNOSIS — F17210 Nicotine dependence, cigarettes, uncomplicated: Secondary | ICD-10-CM

## 2022-03-27 DIAGNOSIS — Z720 Tobacco use: Secondary | ICD-10-CM | POA: Diagnosis not present

## 2022-03-27 DIAGNOSIS — I10 Essential (primary) hypertension: Secondary | ICD-10-CM

## 2022-03-27 DIAGNOSIS — Z79899 Other long term (current) drug therapy: Secondary | ICD-10-CM | POA: Diagnosis not present

## 2022-03-27 DIAGNOSIS — F17200 Nicotine dependence, unspecified, uncomplicated: Secondary | ICD-10-CM

## 2022-03-27 MED ORDER — BUPROPION HCL ER (XL) 150 MG PO TB24: 150.0000 mg | ORAL_TABLET | Freq: Every day | ORAL | 2 refills | Status: DC

## 2022-03-27 NOTE — Progress Notes (Signed)
? ? ?Subjective:  ?CC: HTN ? ?HPI: ? ?Jasmine Jackson is a 51 y.o. female with a past medical history stated below and presents today for HTN. Please see problem based assessment and plan for additional details. ? ?Past Medical History:  ?Diagnosis Date  ? Anemia   ? 06/19/21-pt has no recollection of this dx  ? Anxiety   ? Anxiety and depression   ? Asthma   ? Bipolar 1 disorder ([place])   ? Depression   ? Excessive daytime sleepiness 11/18/2017  ? GERD (gastroesophageal reflux disease)   ? Heart murmur   ? Hemorrhoids   ? Hiatal hernia   ? Hypertension   ? Internal hemorrhoids   ? Irritable bowel disease 2014  ? Toe injury 11/18/2017  ? ? ?Current Outpatient Medications on File Prior to Visit  ?Medication Sig Dispense Refill  ? albuterol (VENTOLIN HFA) 108 (90 Base) MCG/ACT inhaler Inhale 2 puffs into the lungs every 6 (six) hours as needed for wheezing or shortness of breath. 3 each 0  ? AMBULATORY NON FORMULARY MEDICATION Medication Name: GI Cocktail 259m 2% viscous lidocaine 2759mbentyl '10mg'$ /8m72m49m44mlanta ?SIG 30 ml QID prn gas and stomach pain-do not use more than 3 days in a row 1350 mL 2  ? amLODipine (NORVASC) 5 MG tablet TAKE 1 TABLET EVERY DAY 90 tablet 1  ? diclofenac Sodium (VOLTAREN) 1 % GEL APPLY 2 GRAMS TOPICALLY AS NEEDED 100 g 2  ? gabapentin (NEURONTIN) 400 MG capsule Take 1 capsule (400 mg total) by mouth 3 (three) times daily. 90 capsule 2  ? hydrochlorothiazide (HYDRODIURIL) 25 MG tablet TAKE 1 TABLET(25 MG) BY MOUTH DAILY 90 tablet 1  ? hydrOXYzine (ATARAX) 50 MG tablet Take 1 tablet (50 mg total) by mouth 3 (three) times daily as needed. 90 tablet 3  ? lamoTRIgine (LAMICTAL) 100 MG tablet Take 1 tablet (100 mg total) by mouth daily. 90 tablet 3  ? linaclotide (LINZESS) 290 MCG CAPS capsule Take 1 capsule (290 mcg total) by mouth daily before breakfast. 90 capsule 1  ? meloxicam (MOBIC) 15 MG tablet TAKE 1 TABLET(15 MG) BY MOUTH DAILY 30 tablet 2  ? mirtazapine (REMERON SOL-TAB) 15 MG  disintegrating tablet Take 1 tablet (15 mg total) by mouth at bedtime. 30 tablet 3  ? ondansetron (ZOFRAN-ODT) 8 MG disintegrating tablet Take 1 tablet (8 mg total) by mouth every 8 (eight) hours as needed for nausea or vomiting. 30 tablet 0  ? pantoprazole (PROTONIX) 40 MG tablet Take 1 tablet (40 mg total) by mouth 2 (two) times daily. 180 tablet 3  ? prochlorperazine (COMPAZINE) 25 MG suppository Use 1 suppository rectally every 6-8 hours as needed nausea/vomiting 12 suppository 3  ? QUEtiapine (SEROQUEL XR) 400 MG 24 hr tablet Take 3 tablets at bedtime (1200 mg) 90 tablet 3  ? QUEtiapine (SEROQUEL) 400 MG tablet Take 1 tablet (400 mg total) by mouth at bedtime. 90 tablet 3  ? tiZANidine (ZANAFLEX) 4 MG tablet TAKE 1 TABLET EVERY 8 HOURS AS NEEDED FOR MUSCLE SPASMS. 90 tablet 0  ? ?No current facility-administered medications on file prior to visit.  ? ? ?Family History  ?Problem Relation Age of Onset  ? Breast cancer Maternal Aunt   ?     x4  ? Stomach cancer Maternal Aunt   ? Prostate cancer Maternal Uncle   ?     x2  ? Breast cancer Maternal Grandmother   ? Breast cancer Mother 63  49Colon polyps  Mother 31  ? Breast cancer Paternal Grandmother   ? Colon cancer Neg Hx   ? Esophageal cancer Neg Hx   ? Rectal cancer Neg Hx   ? ? ?Social History  ? ?Socioeconomic History  ? Marital status: Single  ?  Spouse name: Not on file  ? Number of children: 1  ? Years of education: Not on file  ? Highest education level: Not on file  ?Occupational History  ? Occupation: Disability  ?Tobacco Use  ? Smoking status: Every Day  ?  Packs/day: 0.50  ?  Years: 37.00  ?  Pack years: 18.50  ?  Types: Cigarettes  ? Smokeless tobacco: Never  ? Tobacco comments:  ?  10  CIG A DAY  ?Vaping Use  ? Vaping Use: Never used  ?Substance and Sexual Activity  ? Alcohol use: No  ?  Alcohol/week: 0.0 standard drinks  ? Drug use: No  ? Sexual activity: Yes  ?  Birth control/protection: Condom  ?  Comment: both  ?Other Topics Concern  ? Not on file   ?Social History Narrative  ? Not on file  ? ?Social Determinants of Health  ? ?Financial Resource Strain: Not on file  ?Food Insecurity: Not on file  ?Transportation Needs: Not on file  ?Physical Activity: Not on file  ?Stress: Not on file  ?Social Connections: Not on file  ?Intimate Partner Violence: Not on file  ? ? ?Review of Systems: ?ROS negative except for what is noted on the assessment and plan. ? ?Objective:  ? ?Vitals:  ? 03/27/22 1420  ?BP: 108/69  ?Pulse: 91  ?Resp: (!) 28  ?Temp: 98.4 ?F (36.9 ?C)  ?TempSrc: Oral  ?SpO2: 100%  ?Weight: 213 lb 6.4 oz (96.8 kg)  ?Height: '5\' 5"'$  (1.651 m)  ? ? ?Physical Exam: ?Gen: A&O x3 and in no apparent distress, well appearing and nourished. ?HEENT:  ?  Head - normocephalic, atraumatic.  ?  Eye - visual acuity grossly intact, conjunctiva clear, sclera non-icteric, EOM intact.  ?  Mouth - No obvious caries or periodontal disease. ?Neck: no masses or nodules, AROM intact. ?CV: RRR, no murmurs, S1/S2 presents  ?Resp: Clear to ascultation bilaterally  ?Abd: BS (+) x4, soft, non-tender abdomen, without hepatosplenomegaly or masses ?MSK: Grossly normal AROM and strength x4 extremities. ?Skin: good skin turgor, no rashes, unusual bruising, or prominent lesions.  ?Neuro: No focal deficits, grossly normal sensation and coordination.  ?Psych: Oriented x3 and responding appropriately. Intact memory, normal mood, judgement, affect, and insight.  ? ? ?Assessment & Plan:  ?See Encounters Tab for problem based charting. ? ?Patient discussed with Dr.  Cain Sieve ? ? ?Marianna Payment, D.O. ?[facility]  PGY-3 ?Pager: [phone]  Phone: [phone] ?Date 03/31/2022  Time 9:33 AM ? ?

## 2022-03-27 NOTE — Patient Instructions (Addendum)
Thank you, [name] for allowing [place] to provide your care today. Today we discussed blood pressure, smoking cessation.   ? ?Labs/Tests Ordered: ?Lab Orders    ?     BMP8+Anion Gap    ?     Albumin     ? ?Referrals Ordered:  ?Referral Orders  ?No referral(s) requested today  ?  ? ?Medication Changes:  ? ?Meds ordered this encounter  ?Medications  ? buPROPion (WELLBUTRIN XL) 150 MG 24 hr tablet  ?  Sig: Take 1 tablet (150 mg total) by mouth daily.  ?  Dispense:  30 tablet  ?  Refill:  2  ?  ? ?Health Maintenance Screening: ?There are no preventive care reminders to display for this patient.  ? ?Instructions:  ? ?Follow up:  1 month   ? ?Remember: If you have any questions or concerns, call our clinic at [phone] or after hours call [phone] and ask for the internal medicine resident on call. ? ?Marianna Payment, D.O. ?[facility] ? ? ? ?

## 2022-03-29 LAB — BMP8+ANION GAP
Anion Gap: 17 mmol/L (ref 10.0–18.0)
BUN/Creatinine Ratio: 15 (ref 9–23)
BUN: 12 mg/dL (ref 6–24)
CO2: 22 mmol/L (ref 20–29)
Calcium: 10.3 mg/dL — ABNORMAL HIGH (ref 8.7–10.2)
Chloride: 101 mmol/L (ref 96–106)
Creatinine, Ser: 0.78 mg/dL (ref 0.57–1.00)
Glucose: 81 mg/dL (ref 70–99)
Potassium: 4.1 mmol/L (ref 3.5–5.2)
Sodium: 140 mmol/L (ref 134–144)
eGFR: 92 mL/min/{1.73_m2} (ref >59)

## 2022-03-29 LAB — ALBUMIN: Albumin: 4.9 g/dL (ref 3.8–4.9)

## 2022-03-30 ENCOUNTER — Other Ambulatory Visit: Payer: Self-pay | Admitting: Gastroenterology

## 2022-03-31 MED ORDER — NICOTROL NS 10 MG/ML NA SOLN: NASAL | 0 refills | Status: DC

## 2022-03-31 NOTE — Assessment & Plan Note (Signed)
Patient presents for counseling regarding smoking cessation. She states that she has tried bupropion and Chantix in the past and states that she had better symptoms control with bupropion.  ? ?Plan: ?- Restart bupropion ?- Restart nicotine replacement therapy.  ?- Discussed counseling for tobacco use disorder.  ?

## 2022-03-31 NOTE — Assessment & Plan Note (Addendum)
HTN well controlled. On HCTZ and tolerating it well. Patient had a mild hypercalcemia on her last BMP. Could be due to her HCTZ. Will get BMP and albumin today for kidney function and electrolytes. Will follow up in 2-4 weeks to discuss medications changes if need be.  ?

## 2022-03-31 NOTE — Assessment & Plan Note (Signed)
Wrote note for patient outlining her lack physical limitations.  ?

## 2022-04-01 ENCOUNTER — Telehealth (HOSPITAL_COMMUNITY): Payer: Self-pay | Admitting: *Deleted

## 2022-04-01 ENCOUNTER — Telehealth (HOSPITAL_COMMUNITY): Payer: Medicare HMO | Admitting: Psychiatry

## 2022-04-01 NOTE — Telephone Encounter (Signed)
Note from 04/01/22 at 1131 is on the wrong chart, disregard. ?

## 2022-04-01 NOTE — Telephone Encounter (Signed)
Call from mom requesting new rx be sent in for patients Vyvanse. Reviewed chart, his next appt is 05/01/22. He should be out of Vyvanse  ?

## 2022-04-03 ENCOUNTER — Other Ambulatory Visit: Payer: Self-pay | Admitting: Internal Medicine

## 2022-04-03 DIAGNOSIS — F17200 Nicotine dependence, unspecified, uncomplicated: Secondary | ICD-10-CM

## 2022-04-04 ENCOUNTER — Telehealth (HOSPITAL_COMMUNITY): Payer: Medicare HMO | Admitting: Psychiatry

## 2022-04-04 ENCOUNTER — Encounter (HOSPITAL_COMMUNITY): Payer: Self-pay

## 2022-04-05 ENCOUNTER — Encounter: Payer: Self-pay | Admitting: Internal Medicine

## 2022-04-09 ENCOUNTER — Other Ambulatory Visit: Payer: Self-pay

## 2022-04-11 MED ORDER — DICLOFENAC SODIUM 1 % EX GEL: CUTANEOUS | 2 refills | Status: DC

## 2022-04-12 ENCOUNTER — Other Ambulatory Visit: Payer: Self-pay | Admitting: Internal Medicine

## 2022-04-12 DIAGNOSIS — Z1231 Encounter for screening mammogram for malignant neoplasm of breast: Secondary | ICD-10-CM

## 2022-04-15 ENCOUNTER — Other Ambulatory Visit (HOSPITAL_COMMUNITY): Payer: Self-pay | Admitting: Psychiatry

## 2022-04-15 NOTE — Telephone Encounter (Signed)
Letter for court done on patient's request ?

## 2022-04-15 NOTE — Progress Notes (Signed)
Internal Medicine Clinic Attending  Case discussed with Dr. Coe  At the time of the visit.  We reviewed the resident's history and exam and pertinent patient test results.  I agree with the assessment, diagnosis, and plan of care documented in the resident's note.  

## 2022-04-17 ENCOUNTER — Encounter: Payer: Self-pay | Admitting: Podiatry

## 2022-04-17 ENCOUNTER — Ambulatory Visit (INDEPENDENT_AMBULATORY_CARE_PROVIDER_SITE_OTHER): Payer: Medicare HMO | Admitting: Podiatry

## 2022-04-17 DIAGNOSIS — M7751 Other enthesopathy of right foot: Secondary | ICD-10-CM

## 2022-04-17 DIAGNOSIS — T148XXA Other injury of unspecified body region, initial encounter: Secondary | ICD-10-CM

## 2022-04-18 ENCOUNTER — Ambulatory Visit: Payer: Medicare HMO

## 2022-04-18 NOTE — Progress Notes (Signed)
Subjective:  ? ?Patient ID: Jasmine Jackson, female   DOB: 51 y.o.   MRN: [id number]  ? ?HPI ?Patient presents stating her ankle continues to hurt but she is also having a lot of pain behind her ankle and it really did not get significantly better after last visit.  States that both areas are very sore and they get swollen ? ? ?ROS ? ? ?   ?Objective:  ?Physical Exam  ?Neurovascular status intact with patient found to have exquisite discomfort in the perineal tendon group behind the lateral malleolus right that has been persistent now for over a year and discomfort into the sinus tarsi with inflammation fluid that did not respond completely to injection treatment with patient wearing a brace also currently ? ?   ?Assessment:  ?Cannot rule out an interstitial tear of the peroneal tendon and I am concerned about the year-long issue with this and I am ordering an MRI to try to understand better.  Also has acute inflammation of the sinus tarsi ? ?   ?Plan:  ?Reviewed MRI and the possibility for surgery if symptoms indicate.  I did go ahead today and I did sterile prep and I injected the sinus tarsi right 3 mg dexamethasone Kenalog 5 mg Xylocaine and I advised on anti-inflammatories and wearing the brace which was put on again today reappoint to recheck ? ?X-rays do not seem to show significant arthritis of the joint appears to be more of a inflammatory condition ?   ? ? ?

## 2022-04-24 ENCOUNTER — Ambulatory Visit (INDEPENDENT_AMBULATORY_CARE_PROVIDER_SITE_OTHER): Payer: Medicare HMO | Admitting: Psychiatry

## 2022-04-24 ENCOUNTER — Ambulatory Visit: Payer: Medicare HMO

## 2022-04-24 DIAGNOSIS — F419 Anxiety disorder, unspecified: Secondary | ICD-10-CM | POA: Diagnosis not present

## 2022-04-24 DIAGNOSIS — F3170 Bipolar disorder, currently in remission, most recent episode unspecified: Secondary | ICD-10-CM

## 2022-04-24 MED ORDER — GABAPENTIN 400 MG PO CAPS: 400.0000 mg | ORAL_CAPSULE | Freq: Three times a day (TID) | ORAL | 0 refills | Status: DC

## 2022-04-24 MED ORDER — HYDROXYZINE HCL 50 MG PO TABS: 50.0000 mg | ORAL_TABLET | Freq: Three times a day (TID) | ORAL | 0 refills | Status: DC | PRN

## 2022-04-24 MED ORDER — MIRTAZAPINE 15 MG PO TBDP: 15.0000 mg | ORAL_TABLET | Freq: Every day | ORAL | 0 refills | Status: DC

## 2022-04-24 NOTE — Progress Notes (Signed)
BH MD/PA/NP OP Progress Note ? ?04/24/2022 6:04 PM ?Jasmine Jackson  ?MRN:  [id number] ? ?Virtual Visit via Telephone Note ? ?I connected with Jasmine Jackson on 04/24/22 at  1:30 PM EDT by telephone and verified that I am speaking with the correct person using two identifiers. ? ?Location: ?Patient: home ?Provider: offsite ?  ?I discussed the limitations, risks, security and privacy concerns of performing an evaluation and management service by telephone and the availability of in person appointments. I also discussed with the patient that there may be a patient responsible charge related to this service. The patient expressed understanding and agreed to proceed. ? ? ? ?  ?I discussed the assessment and treatment plan with the patient. The patient was provided an opportunity to ask questions and all were answered. The patient agreed with the plan and demonstrated an understanding of the instructions. ?  ?The patient was advised to call back or seek an in-person evaluation if the symptoms worsen or if the condition fails to improve as anticipated. ? ?I provided 5 minutes of non-face-to-face time during this encounter. ? ? ?Franne Grip, NP  ? ?Chief Complaint: Medication management ? ?HPI: Jasmine Jackson is a 51 year old female presenting to [facility] outpatient for follow-up psychiatric evaluation.  She has a psychiatric history of bipolar disorder, PTSD, anxiety, panic attacks and depression.  Patient symptoms are managed with Seroquel 400 mg, gabapentin 400 mg 3 times daily, Lamictal 100 mg daily, Remeron 15 mg at twice daily and hydroxyzine 50 mg 3 times daily as needed for anxiety.  Patient reports that medications are effective and denies adverse reactions.  Patient denies need for dosage adjustment today.  No medication changes today. ? ? ? ?Visit Diagnosis:  ?  ICD-10-CM   ?1. Bipolar disorder in full remission, most recent episode unspecified type ([place])  F31.70 gabapentin (NEURONTIN) 400  MG capsule  ?  ?2. Anxiety  F41.9 gabapentin (NEURONTIN) 400 MG capsule  ?  hydrOXYzine (ATARAX) 50 MG tablet  ?  mirtazapine (REMERON SOL-TAB) 15 MG disintegrating tablet  ?  ? ? ?Past Psychiatric History:  ? ?Past Medical History:  ?Past Medical History:  ?Diagnosis Date  ? Anemia   ? 06/19/21-pt has no recollection of this dx  ? Anxiety   ? Anxiety and depression   ? Asthma   ? Bipolar 1 disorder ([place])   ? Depression   ? Excessive daytime sleepiness 11/18/2017  ? GERD (gastroesophageal reflux disease)   ? Heart murmur   ? Hemorrhoids   ? Hiatal hernia   ? Hypertension   ? Internal hemorrhoids   ? Irritable bowel disease 2014  ? Toe injury 11/18/2017  ?  ?Past Surgical History:  ?Procedure Laterality Date  ? COLONOSCOPY  2021  ? HEMORRHOID SURGERY    ? INGUINAL HERNIA REPAIR    ? PARTIAL HYSTERECTOMY    ? fibroids  ? TONSILLECTOMY    ? UPPER GASTROINTESTINAL ENDOSCOPY    ? ? ?Family Psychiatric History:  ? ?Family History:  ?Family History  ?Problem Relation Age of Onset  ? Breast cancer Maternal Aunt   ?     x4  ? Stomach cancer Maternal Aunt   ? Prostate cancer Maternal Uncle   ?     x2  ? Breast cancer Maternal Grandmother   ? Breast cancer Mother 18  ? Colon polyps Mother 36  ? Breast cancer Paternal Grandmother   ? Colon cancer Neg Hx   ? Esophageal cancer  Neg Hx   ? Rectal cancer Neg Hx   ? ? ?Social History:  ?Social History  ? ?Socioeconomic History  ? Marital status: Single  ?  Spouse name: Not on file  ? Number of children: 1  ? Years of education: Not on file  ? Highest education level: Not on file  ?Occupational History  ? Occupation: Disability  ?Tobacco Use  ? Smoking status: Every Day  ?  Packs/day: 0.50  ?  Years: 37.00  ?  Pack years: 18.50  ?  Types: Cigarettes  ? Smokeless tobacco: Never  ? Tobacco comments:  ?  10  CIG A DAY  ?Vaping Use  ? Vaping Use: Never used  ?Substance and Sexual Activity  ? Alcohol use: No  ?  Alcohol/week: 0.0 standard drinks  ? Drug use: No  ? Sexual activity: Yes  ?   Birth control/protection: Condom  ?  Comment: both  ?Other Topics Concern  ? Not on file  ?Social History Narrative  ? Not on file  ? ?Social Determinants of Health  ? ?Financial Resource Strain: Not on file  ?Food Insecurity: Not on file  ?Transportation Needs: Not on file  ?Physical Activity: Not on file  ?Stress: Not on file  ?Social Connections: Not on file  ? ? ?Allergies:  ?Allergies  ?Allergen Reactions  ? Asa [Aspirin] Other (See Comments)  ?  Heart flutters  ? Banana Itching  ? ? ?Metabolic Disorder Labs: ?Lab Results  ?Component Value Date  ? HGBA1C 5.4 11/16/2019  ? ?No results found for: PROLACTIN ?Lab Results  ?Component Value Date  ? CHOL 159 04/24/2015  ? TRIG 149.0 04/24/2015  ? HDL 40.30 04/24/2015  ? CHOLHDL 4 04/24/2015  ? VLDL 29.8 04/24/2015  ? [place] 89 04/24/2015  ? ?Lab Results  ?Component Value Date  ? TSH 1.06 05/29/2021  ? TSH 0.771 07/31/2020  ? ? ?Therapeutic Level Labs: ?No results found for: LITHIUM ?No results found for: VALPROATE ?No components found for:  CBMZ ? ?Current Medications: ?Current Outpatient Medications  ?Medication Sig Dispense Refill  ? albuterol (VENTOLIN HFA) 108 (90 Base) MCG/ACT inhaler Inhale 2 puffs into the lungs every 6 (six) hours as needed for wheezing or shortness of breath. 3 each 0  ? AMBULATORY NON FORMULARY MEDICATION Medication Name: GI Cocktail 276m 2% viscous lidocaine 27616mbentyl '10mg'$ /16m73m16m61mlanta ?SIG 30 ml QID prn gas and stomach pain-do not use more than 3 days in a row 1350 mL 2  ? amLODipine (NORVASC) 5 MG tablet TAKE 1 TABLET EVERY DAY 90 tablet 1  ? buPROPion (WELLBUTRIN XL) 150 MG 24 hr tablet Take 1 tablet (150 mg total) by mouth daily. 30 tablet 2  ? diclofenac Sodium (VOLTAREN) 1 % GEL APPLY 2 GRAMS TOPICALLY AS NEEDED 100 g 2  ? gabapentin (NEURONTIN) 400 MG capsule Take 1 capsule (400 mg total) by mouth 3 (three) times daily. 90 capsule 0  ? hydrochlorothiazide (HYDRODIURIL) 25 MG tablet TAKE 1 TABLET(25 MG) BY MOUTH DAILY 90  tablet 1  ? hydrOXYzine (ATARAX) 50 MG tablet Take 1 tablet (50 mg total) by mouth 3 (three) times daily as needed. 90 tablet 0  ? lamoTRIgine (LAMICTAL) 100 MG tablet Take 1 tablet (100 mg total) by mouth daily. 90 tablet 3  ? LINZESS 290 MCG CAPS capsule TAKE 1 CAPSULE(290 MCG) BY MOUTH DAILY BEFORE BREAKFAST 90 capsule 1  ? meloxicam (MOBIC) 15 MG tablet TAKE 1 TABLET(15 MG) BY MOUTH DAILY 30 tablet 2  ?  mirtazapine (REMERON SOL-TAB) 15 MG disintegrating tablet Take 1 tablet (15 mg total) by mouth at bedtime. 30 tablet 0  ? Nicotine (NICOTROL NS) 10 MG/ML SOLN USE 1 SPRAY BY NASAL ROUTE AS NEEDED FOR NICOTINE CRAVINGS ?Strength: 10 mg/mL 40 mL 0  ? ondansetron (ZOFRAN-ODT) 8 MG disintegrating tablet Take 1 tablet (8 mg total) by mouth every 8 (eight) hours as needed for nausea or vomiting. 30 tablet 0  ? pantoprazole (PROTONIX) 40 MG tablet Take 1 tablet (40 mg total) by mouth 2 (two) times daily. 180 tablet 3  ? prochlorperazine (COMPAZINE) 25 MG suppository Use 1 suppository rectally every 6-8 hours as needed nausea/vomiting 12 suppository 3  ? QUEtiapine (SEROQUEL XR) 400 MG 24 hr tablet Take 3 tablets at bedtime (1200 mg) 90 tablet 3  ? QUEtiapine (SEROQUEL) 400 MG tablet Take 1 tablet (400 mg total) by mouth at bedtime. 90 tablet 3  ? tiZANidine (ZANAFLEX) 4 MG tablet TAKE 1 TABLET EVERY 8 HOURS AS NEEDED FOR MUSCLE SPASMS. 90 tablet 0  ? ?No current facility-administered medications for this visit.  ? ? ? ?Musculoskeletal: ?Strength & Muscle Tone: N/A virtual visit ?Gait & Station: N/A virtual visit ?Patient leans: N/A ? ?Psychiatric Specialty Exam: ?Review of Systems  ?Psychiatric/Behavioral:  Negative for hallucinations, self-injury and suicidal ideas.   ?All other systems reviewed and are negative.  ?There were no vitals taken for this visit.There is no height or weight on file to calculate BMI.  ?General Appearance: N/A  ?Eye Contact: N/A  ?Speech: Clear coherent  ?Volume: Normal  ?Mood: Euthymic   ?Affect: N/A  ?Thought Process:  Goal Directed  ?Orientation:  Full (Time, Place, and Person)  ?Thought Content: Logical   ?Suicidal Thoughts:  No  ?Homicidal Thoughts:  No  ?Memory: Good  ?Judgement: Good  ?I

## 2022-04-29 ENCOUNTER — Ambulatory Visit
Admission: RE | Admit: 2022-04-29 | Discharge: 2022-04-29 | Disposition: A | Discharge status: Home / Self Care | Payer: Medicare HMO | Source: Ambulatory Visit | Attending: Podiatry | Admitting: Podiatry

## 2022-04-29 DIAGNOSIS — M19071 Primary osteoarthritis, right ankle and foot: Secondary | ICD-10-CM | POA: Diagnosis not present

## 2022-04-29 DIAGNOSIS — M7751 Other enthesopathy of right foot: Secondary | ICD-10-CM

## 2022-04-29 DIAGNOSIS — T148XXA Other injury of unspecified body region, initial encounter: Secondary | ICD-10-CM

## 2022-04-29 DIAGNOSIS — S86311A Strain of muscle(s) and tendon(s) of peroneal muscle group at lower leg level, right leg, initial encounter: Secondary | ICD-10-CM | POA: Diagnosis not present

## 2022-05-01 NOTE — Progress Notes (Signed)
You may want to take this patient for peroneal repair. You can put on your schedule

## 2022-05-06 ENCOUNTER — Other Ambulatory Visit: Payer: Self-pay

## 2022-05-10 MED ORDER — HYDROCHLOROTHIAZIDE 25 MG PO TABS: ORAL_TABLET | ORAL | 1 refills | Status: DC

## 2022-05-13 ENCOUNTER — Ambulatory Visit: Payer: Medicare HMO | Admitting: Podiatry

## 2022-05-14 ENCOUNTER — Encounter: Payer: Self-pay | Admitting: Internal Medicine

## 2022-05-28 ENCOUNTER — Other Ambulatory Visit (HOSPITAL_COMMUNITY): Payer: Self-pay | Admitting: Psychiatry

## 2022-05-28 ENCOUNTER — Telehealth (HOSPITAL_COMMUNITY): Payer: Self-pay | Admitting: *Deleted

## 2022-05-28 DIAGNOSIS — F419 Anxiety disorder, unspecified: Secondary | ICD-10-CM

## 2022-05-28 MED ORDER — MIRTAZAPINE 15 MG PO TBDP: 15.0000 mg | ORAL_TABLET | Freq: Every day | ORAL | 0 refills | Status: DC

## 2022-05-28 NOTE — Telephone Encounter (Signed)
Pharmacy fax request for patients Mirtazapine, she should be out and her next appt is not until 07/25/22. She was last seen in May. Will forward this request to Eulis Canner NP for consider rx to be sent in to her pharmacy which is [facility] on [address].

## 2022-05-28 NOTE — Telephone Encounter (Signed)
Medication refilled and sent to preferred pharmacy

## 2022-06-10 ENCOUNTER — Ambulatory Visit (INDEPENDENT_AMBULATORY_CARE_PROVIDER_SITE_OTHER): Payer: 59 | Admitting: Podiatry

## 2022-06-10 DIAGNOSIS — Z91199 Patient's noncompliance with other medical treatment and regimen due to unspecified reason: Secondary | ICD-10-CM

## 2022-06-15 ENCOUNTER — Encounter: Payer: Self-pay | Admitting: *Deleted

## 2022-06-18 ENCOUNTER — Telehealth (HOSPITAL_COMMUNITY): Payer: Self-pay | Admitting: *Deleted

## 2022-06-19 ENCOUNTER — Other Ambulatory Visit (HOSPITAL_COMMUNITY): Payer: Self-pay | Admitting: Psychiatry

## 2022-06-20 ENCOUNTER — Telehealth (HOSPITAL_COMMUNITY): Payer: Self-pay | Admitting: *Deleted

## 2022-06-20 ENCOUNTER — Other Ambulatory Visit (HOSPITAL_COMMUNITY): Payer: Self-pay | Admitting: Psychiatry

## 2022-06-20 DIAGNOSIS — F3170 Bipolar disorder, currently in remission, most recent episode unspecified: Secondary | ICD-10-CM

## 2022-06-20 MED ORDER — QUETIAPINE FUMARATE ER 400 MG PO TB24: 800.0000 mg | ORAL_TABLET | Freq: Every day | ORAL | 3 refills | Status: DC

## 2022-06-20 NOTE — Telephone Encounter (Signed)
Prior authorization denied for patients quetiapine ER. Dose exceeded what they would allow. Will notify the provider.

## 2022-06-20 NOTE — Telephone Encounter (Signed)
Patient informed Probation officer that she takes Seroquel ER 1200 mg nightly is well as Seroquel(regular) 400 nightly for total of 1600.  Provider discussed tapering medications down at previous visit however patient notes that she did not want to reduce her dose.  Provider discussed with attending physician the higher dose and was informed by attending physician that her dose could be continued.  Patient's insurance will not cover patient's current dose as it is above the recommended dosage.  At this time patient agreeable to reduce Seroquel '1600mg'$  to 800 mg.  Provider informed patient to taper off her medications over the next few weeks.  Provider recommended tapering dose every 5 days until she reaches 800 mg.  She endorsed understanding and agreed.  Provider informed patient that if side effects occur she can walk into the clinic for further evaluation.  She endorsed understanding and agreed.  Provider also informed patient that other medication adjustments could be done to help manage symptoms if needed.  She endorsed understanding and agreed.  No other concerns at this time.

## 2022-06-20 NOTE — Telephone Encounter (Signed)
Call from [place] [facility] to ask that we call into her pharmacy the regular Quetiapine, the XR has been called in and filled but the regular has not been. Will notify Dr Ronne Binning re this concern.

## 2022-06-20 NOTE — Telephone Encounter (Signed)
Patient is now prescribed Seroquel XR 800 mg (she will take two 400 mg pills). She was instructed to discontinue her Regular Seroquel.

## 2022-07-01 ENCOUNTER — Telehealth (HOSPITAL_COMMUNITY): Payer: Self-pay | Admitting: *Deleted

## 2022-07-01 ENCOUNTER — Other Ambulatory Visit (HOSPITAL_COMMUNITY): Payer: Self-pay | Admitting: Psychiatry

## 2022-07-01 DIAGNOSIS — F419 Anxiety disorder, unspecified: Secondary | ICD-10-CM

## 2022-07-01 MED ORDER — HYDROXYZINE HCL 50 MG PO TABS: 50.0000 mg | ORAL_TABLET | Freq: Three times a day (TID) | ORAL | 3 refills | Status: DC | PRN

## 2022-07-01 NOTE — Telephone Encounter (Signed)
Medication refilled and sent to preferred pharmacy

## 2022-07-01 NOTE — Telephone Encounter (Signed)
Patient of Franne Grip Sending Requests to Dr Zigmund Gottron   Rx Refill Request  hydrOXYzine (ATARAX) 50 MG tablet Take 1 tablet (50 mg total) by mouth  3 (three) times daily as needed

## 2022-07-03 ENCOUNTER — Other Ambulatory Visit: Payer: Self-pay

## 2022-07-03 ENCOUNTER — Other Ambulatory Visit (HOSPITAL_COMMUNITY): Payer: Self-pay | Admitting: Psychiatry

## 2022-07-03 DIAGNOSIS — F419 Anxiety disorder, unspecified: Secondary | ICD-10-CM

## 2022-07-03 DIAGNOSIS — Z720 Tobacco use: Secondary | ICD-10-CM

## 2022-07-03 MED ORDER — BUPROPION HCL ER (XL) 150 MG PO TB24: 150.0000 mg | ORAL_TABLET | Freq: Every day | ORAL | 2 refills | Status: DC

## 2022-07-03 NOTE — Telephone Encounter (Signed)
Please have patient follow-up for HTN and depression.

## 2022-07-04 ENCOUNTER — Telehealth: Payer: Self-pay | Admitting: Gastroenterology

## 2022-07-05 NOTE — Telephone Encounter (Signed)
Left message for patient to return my call.

## 2022-07-08 NOTE — Telephone Encounter (Signed)
Left message for patient to return my call.

## 2022-07-10 NOTE — Telephone Encounter (Signed)
Left message for patient to return my call. Will await patient's return call.

## 2022-07-11 MED ORDER — AMBULATORY NON FORMULARY MEDICATION: 2 refills | Status: DC

## 2022-07-11 NOTE — Telephone Encounter (Signed)
Sorry sir, It's cheaper and easier for patient to have all the ingredients mixed together and sent to [facility]. Is it ok to refill?

## 2022-07-11 NOTE — Telephone Encounter (Signed)
Informed patient prescription was sent to [facility].

## 2022-07-11 NOTE — Telephone Encounter (Signed)
OK refill the same GI cocktail at another pharmacy.

## 2022-07-11 NOTE — Telephone Encounter (Signed)
Patient returned my call and states she would like another refill of her GI cocktail sent to another compounding pharmacy. [facility] does not have lidocaine. Patient reports she is still having intermittent epigastric abd pain with N/V twice a month. Patient states the GI cocktail relieves her pain and settles her stomach. Patient states she uses it so infrequent that it goes bad before she uses all of it. Patient wonders if we can send a smaller amount to the pharmacy. Please advise Dr. Fuller Plan.  Also patient reports more straining with constipation this last week. Patient currently takes Linzess 290 mcg daily. Informed patient to pick up OTC Miralax to add with her Linzess regimen taking 1/2-1 scoop daily but can titrate up or down for an adequate bowel movement.

## 2022-07-11 NOTE — Addendum Note (Signed)
Addended by: Dorisann Frames L on: 07/11/2022 04:47 PM   Modules accepted: Orders

## 2022-07-11 NOTE — Telephone Encounter (Signed)
Send a separate prescription for viscous lidocaine 47m po tid prn, 200 mL, 1 refills possibly at another pharmacy

## 2022-07-18 ENCOUNTER — Other Ambulatory Visit: Payer: Self-pay

## 2022-07-18 ENCOUNTER — Other Ambulatory Visit: Payer: Self-pay | Admitting: Podiatry

## 2022-07-18 ENCOUNTER — Ambulatory Visit (INDEPENDENT_AMBULATORY_CARE_PROVIDER_SITE_OTHER): Payer: 59

## 2022-07-18 VITALS — BP 132/90 | HR 88 | Temp 98.2°F | Ht 66.0 in | Wt 230.0 lb

## 2022-07-18 DIAGNOSIS — M62838 Other muscle spasm: Secondary | ICD-10-CM

## 2022-07-18 DIAGNOSIS — F17210 Nicotine dependence, cigarettes, uncomplicated: Secondary | ICD-10-CM

## 2022-07-18 DIAGNOSIS — M5431 Sciatica, right side: Secondary | ICD-10-CM | POA: Diagnosis not present

## 2022-07-18 DIAGNOSIS — I10 Essential (primary) hypertension: Secondary | ICD-10-CM

## 2022-07-18 DIAGNOSIS — M62830 Muscle spasm of back: Secondary | ICD-10-CM | POA: Diagnosis not present

## 2022-07-18 MED ORDER — HYDROCHLOROTHIAZIDE 25 MG PO TABS: ORAL_TABLET | ORAL | 1 refills | Status: DC

## 2022-07-18 MED ORDER — AMLODIPINE BESYLATE 5 MG PO TABS: ORAL_TABLET | ORAL | 1 refills | Status: DC

## 2022-07-18 MED ORDER — GABAPENTIN 100 MG PO CAPS: 200.0000 mg | ORAL_CAPSULE | Freq: Three times a day (TID) | ORAL | 2 refills | Status: DC

## 2022-07-18 MED ORDER — CYCLOBENZAPRINE HCL 7.5 MG PO TABS: 7.5000 mg | ORAL_TABLET | Freq: Three times a day (TID) | ORAL | 3 refills | Status: DC | PRN

## 2022-07-18 NOTE — Patient Instructions (Addendum)
Thank you, Jasmine Jackson for allowing [place] to provide your care today. Today we discussed :  Muscle spasms- We switched you from tizanidine to flexeril given that you were not having benefit from tizanidine anymore.  Leg pain- You do not have evidence of arthritis and steroid injections will not help the type of pain you are having. You are having nerve pain that is likely from compression of your sciatic nerve as it passes down the back of your leg. Instead of adding new medications right now, we will increase your gabapentin to '600mg'$  three times a day. This is one of the best medications for nerve pain. Please follow up if this pain continues.   I have ordered the following medication/changed the following medications:   Stop the following medications: Medications Discontinued During This Encounter  Medication Reason   amLODipine (NORVASC) 5 MG tablet Reorder   hydrochlorothiazide (HYDRODIURIL) 25 MG tablet Reorder   tiZANidine (ZANAFLEX) 4 MG tablet Discontinued by provider     Start the following medications: Meds ordered this encounter  Medications   amLODipine (NORVASC) 5 MG tablet    Sig: TAKE 1 TABLET EVERY DAY    Dispense:  90 tablet    Refill:  1   hydrochlorothiazide (HYDRODIURIL) 25 MG tablet    Sig: TAKE 1 TABLET(25 MG) BY MOUTH DAILY    Dispense:  90 tablet    Refill:  1   cyclobenzaprine (FEXMID) 7.5 MG tablet    Sig: Take 1 tablet (7.5 mg total) by mouth 3 (three) times daily as needed for muscle spasms.    Dispense:  30 tablet    Refill:  3   gabapentin (NEURONTIN) 100 MG capsule    Sig: Take 2 capsules (200 mg total) by mouth 3 (three) times daily.    Dispense:  180 capsule    Refill:  2     Follow up: As needed   We look forward to seeing you next time. Please call our clinic at [phone] if you have any questions or concerns. The best time to call is Monday-Friday from 9am-4pm, but there is someone available 24/7. If after hours or the weekend, call  the main hospital number and ask for the Internal Medicine Resident On-Call. If you need medication refills, please notify your pharmacy one week in advance and they will send [place] a request.   Thank you for trusting me with your care. Wishing you the best!   Iona Coach, MD [place]

## 2022-07-18 NOTE — Progress Notes (Deleted)
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

## 2022-07-19 DIAGNOSIS — M5430 Sciatica, unspecified side: Secondary | ICD-10-CM | POA: Insufficient documentation

## 2022-07-19 NOTE — Assessment & Plan Note (Signed)
Patient presented for steroid shot of the knee which she says she has had in the past. Most recent xrays in 2020 showed no bony or joint changes of the knee. On further questioning the patient is having burning pain starting in the back of her leg near the gluteus maximus and traveling down to the posterior knee area. She denies back pain but does endorse weakness of the leg. She denies red flag symptoms such as bowel/bladder incontinence, saddle anesthesia. On exam there is no ptp of the lumbar spine or paraspinal muscles, straight leg test produced worsening posterior leg burning but no pain in the back, sensation of the R LE was fully intact, muscle strength about the hips/knee/ankle were 5/5. Counseled patient that she does not have OA and that injections do not fix sciatic nerve pain. Also told her that imaging is not appropriate as she is not having overt radiculopathy signs. -Increase gabapentin(used for anxiety per patient) from '400mg'$  TID to '600mg'$  TID -F/U if not improving

## 2022-07-19 NOTE — Progress Notes (Signed)
Established Patient Office Visit  Subjective   Patient ID: Jasmine Jackson, female    DOB: October 18, 1971  Age: 51 y.o. MRN: [id number]  Chief Complaint  Patient presents with   Follow-up    TO  DISCUSS ABOUT THE MUSCLE SPASAM  / RIGHT KNEE INJECTION  / MEDICATION REFILL / RIGHT KNEE PAIN # 5.    Ms. Gugliotta is a 51 y/o female with a pmh outlined below who presents for sciatica and worsening muscle spasms. See A/P for HPI.      Review of Systems  All other systems reviewed and are negative.     Objective:     BP 132/90 (BP Location: Left Arm, Patient Position: Sitting, Cuff Size: Normal)   Pulse 88   Temp 98.2 F (36.8 C) (Oral)   Ht '5\' 6"'$  (1.676 m)   Wt 230 lb (104.3 kg)   SpO2 100%   BMI 37.12 kg/m    Physical Exam Constitutional:      General: She is not in acute distress.    Appearance: Normal appearance. She is obese.  Cardiovascular:     Rate and Rhythm: Normal rate and regular rhythm.     Pulses: Normal pulses.     Heart sounds: Normal heart sounds. No murmur heard.    No gallop.  Pulmonary:     Effort: Pulmonary effort is normal. No respiratory distress.     Breath sounds: Normal breath sounds. No wheezing or rales.  Musculoskeletal:     Right lower leg: No edema.     Left lower leg: No edema.     Comments: No ptp of the right patella or lateral/medial knee joint lines, no swelling of the right knee, no crepitus or pain on flexion/extension/mcmurray testing of the right knee. Sensation about the RLE fully intact, 5/5 strength about all joints of the RLE. No ptp of the lumbar spine or paraspinal muscles. Straight leg test on the right with worsening posterior leg burning but no pain into the back.  Skin:    General: Skin is warm and dry.     Capillary Refill: Capillary refill takes less than 2 seconds.  Neurological:     Mental Status: She is alert.      No results found for any visits on 07/18/22.    The ASCVD Risk score (Arnett DK, et al., 2019)  failed to calculate for the following reasons:   Cannot find a previous HDL lab   Cannot find a previous total cholesterol lab    Assessment & Plan:   Problem List Items Addressed This Visit       Cardiovascular and Mediastinum   Hypertension - Primary   Relevant Medications   amLODipine (NORVASC) 5 MG tablet   hydrochlorothiazide (HYDRODIURIL) 25 MG tablet     Nervous and Auditory   Sciatica without back pain    Patient presented for steroid shot of the knee which she says she has had in the past. Most recent xrays in 2020 showed no bony or joint changes of the knee. On further questioning the patient is having burning pain starting in the back of her leg near the gluteus maximus and traveling down to the posterior knee area. She denies back pain but does endorse weakness of the leg. She denies red flag symptoms such as bowel/bladder incontinence, saddle anesthesia. On exam there is no ptp of the lumbar spine or paraspinal muscles, straight leg test produced worsening posterior leg burning but no pain in the back,  sensation of the R LE was fully intact, muscle strength about the hips/knee/ankle were 5/5. Counseled patient that she does not have OA and that injections do not fix sciatic nerve pain. Also told her that imaging is not appropriate as she is not having overt radiculopathy signs. -Increase gabapentin(used for anxiety per patient) from '400mg'$  TID to '600mg'$  TID -F/U if not improving      Relevant Medications   cyclobenzaprine (FEXMID) 7.5 MG tablet   gabapentin (NEURONTIN) 100 MG capsule     Other   Back muscle spasm    Patient continues to have leg, back, arm and chest muscle spasms 2 times per month. Says tizanidine is not working for her, although it seems per chart review this has been the case for years. It appears she tried baclofen in the past as well per past notes but the patient denied trying other meds. Will try flexeril as a different treatment option for relief given  that she is not using this frequently. There was no ptp of muscle groups on exam. -Stop tizanidine '4mg'$  -Start cyclobenzaprine 7.'5mg'$  TID PRN      Relevant Medications   cyclobenzaprine (FEXMID) 7.5 MG tablet   Other Visit Diagnoses     Muscle spasm       Relevant Medications   cyclobenzaprine (FEXMID) 7.5 MG tablet   Sciatica of right side       Relevant Medications   cyclobenzaprine (FEXMID) 7.5 MG tablet   gabapentin (NEURONTIN) 100 MG capsule       No follow-ups on file.    Iona Coach, MD

## 2022-07-19 NOTE — Assessment & Plan Note (Signed)
Patient continues to have leg, back, arm and chest muscle spasms 2 times per month. Says tizanidine is not working for her, although it seems per chart review this has been the case for years. It appears she tried baclofen in the past as well per past notes but the patient denied trying other meds. Will try flexeril as a different treatment option for relief given that she is not using this frequently. There was no ptp of muscle groups on exam. -Stop tizanidine '4mg'$  -Start cyclobenzaprine 7.'5mg'$  TID PRN

## 2022-07-22 NOTE — Progress Notes (Signed)
Internal Medicine Clinic Attending  Case discussed with Dr. Rogers  at the time of the visit.  We reviewed the resident's history and exam and pertinent patient test results.  I agree with the assessment, diagnosis, and plan of care documented in the resident's note.  

## 2022-07-25 ENCOUNTER — Ambulatory Visit (INDEPENDENT_AMBULATORY_CARE_PROVIDER_SITE_OTHER): Payer: 59 | Admitting: Psychiatry

## 2022-07-25 ENCOUNTER — Encounter (HOSPITAL_COMMUNITY): Payer: Self-pay | Admitting: Psychiatry

## 2022-07-25 DIAGNOSIS — F17200 Nicotine dependence, unspecified, uncomplicated: Secondary | ICD-10-CM | POA: Diagnosis not present

## 2022-07-25 DIAGNOSIS — F419 Anxiety disorder, unspecified: Secondary | ICD-10-CM

## 2022-07-25 DIAGNOSIS — F3170 Bipolar disorder, currently in remission, most recent episode unspecified: Secondary | ICD-10-CM | POA: Diagnosis not present

## 2022-07-25 MED ORDER — LAMOTRIGINE 100 MG PO TABS: 100.0000 mg | ORAL_TABLET | Freq: Every day | ORAL | 3 refills | Status: DC

## 2022-07-25 MED ORDER — QUETIAPINE FUMARATE ER 400 MG PO TB24: 800.0000 mg | ORAL_TABLET | Freq: Every day | ORAL | 3 refills | Status: DC

## 2022-07-25 MED ORDER — GABAPENTIN 400 MG PO CAPS: 400.0000 mg | ORAL_CAPSULE | Freq: Three times a day (TID) | ORAL | 3 refills | Status: DC

## 2022-07-25 MED ORDER — NICOTROL NS 10 MG/ML NA SOLN: NASAL | 3 refills | Status: DC

## 2022-07-25 MED ORDER — MIRTAZAPINE 30 MG PO TBDP: ORAL_TABLET | ORAL | 3 refills | Status: DC

## 2022-07-25 NOTE — Progress Notes (Signed)
[facility] MD/PA/NP OP Progress Note Virtual Visit via Telephone Note  I connected with Jasmine Jackson on 07/25/22 at  1:00 PM EDT by telephone and verified that I am speaking with the correct person using two identifiers.  Location: Patient: home Provider: Clinic   I discussed the limitations, risks, security and privacy concerns of performing an evaluation and management service by telephone and the availability of in person appointments. I also discussed with the patient that there may be a patient responsible charge related to this service. The patient expressed understanding and agreed to proceed.   I provided 30 minutes of non-face-to-face time during this encounter.  07/25/2022 1:09 PM Jasmine Jackson  MRN:  [id number]  Chief Complaint: "I have been restless since reducing Seroquel"  HPI: 51 year old female seen for follow up psychiatric evaluation. She has a psychiatric history of Bipolar disorder, PTSD, anxiety, panic attacks, and depression. Currently she is managed on Seroquel XR 800 mg nightly, Mirtazapine 15 mg nightly, Lamictal 100 mg daily, gabapentin 400 mg three times daily, and Wellbutrin SR 150 mg BID.  She informed Probation officer that she no longer takes Wellbutrin but instead Nicotrol NS 10 mg/ml  solution. She was recently prescribed gabapentin 200 mg 3 times daily by her PCP.  She notes her medications are somewhat effective in managing her psychiatric conditions.   Today she was unable to login virtually so her exam was done over the phone. During exam she was  pleasant, cooperative, and engaged in conversation. She informed Probation officer that since discontinuing Seroquel 1200 mg nightly she has been feeling restless.  She informed Probation officer that she has only been sleeping 4 to 6 hours nightly and reports that she feels tired in the daytime.  She did Artist that her anxiety and depression are well managed.  Provider conducted GAD-7 and patient scored a 4.  Provider also conducted PHQ-9 and  patient scored a 6.  She endorses increased appetite but denies increased weight.  Today she denies SI/HI/VAH, mania, paranoia.  She is agreeable to increase from mirtazapine 15 mg to 30 mg to help manage sleep.  At this time she does not wish to restart Wellbutrin.  She will continue all other medications as prescribed.  No other concerns noted at this time.  Visit Diagnosis:    ICD-10-CM   1. Tobacco use  Z72.0     2. Bipolar disorder in full remission, most recent episode unspecified type ([place])  F31.70 gabapentin (NEURONTIN) 400 MG capsule    lamoTRIgine (LAMICTAL) 100 MG tablet    QUEtiapine (SEROQUEL XR) 400 MG 24 hr tablet    3. Anxiety  F41.9 gabapentin (NEURONTIN) 400 MG capsule    mirtazapine (REMERON SOL-TAB) 30 MG disintegrating tablet    4. Primary hypertension  I10     5. Tobacco use disorder  F17.200 Nicotine (NICOTROL NS) 10 MG/ML SOLN      Past Psychiatric History: Bipolar disorder, PTSD, Anxiety, Tobacco use  Past Medical History:  Past Medical History:  Diagnosis Date   Anemia    06/19/21-pt has no recollection of this dx   Anxiety    Anxiety and depression    Asthma    Bipolar 1 disorder ([place])    Depression    Excessive daytime sleepiness 11/18/2017   GERD (gastroesophageal reflux disease)    Heart murmur    Hemorrhoids    Hiatal hernia    Hypertension    Internal hemorrhoids    Irritable bowel disease 2014  Toe injury 11/18/2017    Past Surgical History:  Procedure Laterality Date   COLONOSCOPY  2021   HEMORRHOID SURGERY     INGUINAL HERNIA REPAIR     PARTIAL HYSTERECTOMY     fibroids   TONSILLECTOMY     UPPER GASTROINTESTINAL ENDOSCOPY      Family Psychiatric History: Son autism  Family History:  Family History  Problem Relation Age of Onset   Breast cancer Maternal Aunt        x4   Stomach cancer Maternal Aunt    Prostate cancer Maternal Uncle        x2   Breast cancer Maternal Grandmother    Breast cancer Mother 72   Colon  polyps Mother 62   Breast cancer Paternal Grandmother    Colon cancer Neg Hx    Esophageal cancer Neg Hx    Rectal cancer Neg Hx     Social History:  Social History   Socioeconomic History   Marital status: Single    Spouse name: Not on file   Number of children: 1   Years of education: Not on file   Highest education level: Not on file  Occupational History   Occupation: Disability  Tobacco Use   Smoking status: Every Day    Packs/day: 0.50    Years: 37.00    Total pack years: 18.50    Types: Cigarettes   Smokeless tobacco: Never   Tobacco comments:    10  CIG A DAY  Vaping Use   Vaping Use: Never used  Substance and Sexual Activity   Alcohol use: No    Alcohol/week: 0.0 standard drinks of alcohol   Drug use: No   Sexual activity: Yes    Birth control/protection: Condom    Comment: both  Other Topics Concern   Not on file  Social History Narrative   Not on file   Social Determinants of Health   Financial Resource Strain: Not on file  Food Insecurity: Not on file  Transportation Needs: Not on file  Physical Activity: Not on file  Stress: Not on file  Social Connections: Not on file    Allergies:  Allergies  Allergen Reactions   Asa [Aspirin] Other (See Comments)    Heart flutters   Banana Itching    Metabolic Disorder Labs: Lab Results  Component Value Date   HGBA1C 5.4 11/16/2019   No results found for: "PROLACTIN" Lab Results  Component Value Date   CHOL 159 04/24/2015   TRIG 149.0 04/24/2015   HDL 40.30 04/24/2015   CHOLHDL 4 04/24/2015   VLDL 29.8 04/24/2015   LDLCALC 89 04/24/2015   Lab Results  Component Value Date   TSH 1.06 05/29/2021   TSH 0.771 07/31/2020    Therapeutic Level Labs: No results found for: "LITHIUM" No results found for: "VALPROATE" No results found for: "CBMZ"  Current Medications: Current Outpatient Medications  Medication Sig Dispense Refill   albuterol (VENTOLIN HFA) 108 (90 Base) MCG/ACT inhaler  Inhale 2 puffs into the lungs every 6 (six) hours as needed for wheezing or shortness of breath. 3 each 0   AMBULATORY NON FORMULARY MEDICATION Medication Name: GI Cocktail 90 ml 2% viscous lidocaine 90 ml bentyl '10mg'$ /53m 270 ml Mylanta SIG 10 ml bid prn gas and stomach pain-do not use more than 3 days in a row 600 mL 2   amLODipine (NORVASC) 5 MG tablet TAKE 1 TABLET EVERY DAY 90 tablet 1   cyclobenzaprine (FEXMID) 7.5 MG tablet Take  1 tablet (7.5 mg total) by mouth 3 (three) times daily as needed for muscle spasms. 30 tablet 3   diclofenac Sodium (VOLTAREN) 1 % GEL APPLY 2 GRAMS TOPICALLY AS NEEDED 100 g 2   gabapentin (NEURONTIN) 100 MG capsule Take 2 capsules (200 mg total) by mouth 3 (three) times daily. 180 capsule 2   gabapentin (NEURONTIN) 400 MG capsule Take 1 capsule (400 mg total) by mouth 3 (three) times daily. 90 capsule 3   hydrochlorothiazide (HYDRODIURIL) 25 MG tablet TAKE 1 TABLET(25 MG) BY MOUTH DAILY 90 tablet 1   hydrOXYzine (ATARAX) 50 MG tablet Take 1 tablet (50 mg total) by mouth 3 (three) times daily as needed. 90 tablet 3   lamoTRIgine (LAMICTAL) 100 MG tablet Take 1 tablet (100 mg total) by mouth daily. 90 tablet 3   LINZESS 290 MCG CAPS capsule TAKE 1 CAPSULE(290 MCG) BY MOUTH DAILY BEFORE BREAKFAST 90 capsule 1   meloxicam (MOBIC) 15 MG tablet TAKE 1 TABLET(15 MG) BY MOUTH DAILY 30 tablet 2   mirtazapine (REMERON SOL-TAB) 30 MG disintegrating tablet DISSOLVE 1 TABLET(15 MG) ON THE TONGUE AT BEDTIME 30 tablet 3   Nicotine (NICOTROL NS) 10 MG/ML SOLN USE 1 SPRAY BY NASAL ROUTE AS NEEDED FOR NICOTINE CRAVINGS Strength: 10 mg/mL 40 mL 3   ondansetron (ZOFRAN-ODT) 8 MG disintegrating tablet Take 1 tablet (8 mg total) by mouth every 8 (eight) hours as needed for nausea or vomiting. 30 tablet 0   pantoprazole (PROTONIX) 40 MG tablet Take 1 tablet (40 mg total) by mouth 2 (two) times daily. 180 tablet 3   prochlorperazine (COMPAZINE) 25 MG suppository Use 1 suppository rectally  every 6-8 hours as needed nausea/vomiting 12 suppository 3   QUEtiapine (SEROQUEL XR) 400 MG 24 hr tablet Take 2 tablets (800 mg total) by mouth at bedtime. Take 2 tablets at bedtime (800) 60 tablet 3   No current facility-administered medications for this visit.     Musculoskeletal: Strength & Muscle Tone:  Unable to assess due to telephone visit Gait & Station:  Unable to assess due to telephone visit Patient leans:  Unable to assess due to telephone visit  Psychiatric Specialty Exam: Review of Systems  There were no vitals taken for this visit.There is no height or weight on file to calculate BMI.  General Appearance:  Unable to assess due to telephone visit  Eye Contact:   Unable to assess due to telephone visit  Speech:  Clear and Coherent and Normal Rate  Volume:  Normal  Mood:  Euthymic  Affect:   Unable to assess due to telephone visit  Thought Process:  Coherent, Goal Directed, and Linear  Orientation:  Full (Time, Place, and Person)  Thought Content: WDL and Logical   Suicidal Thoughts:  No  Homicidal Thoughts:  No  Memory:  Immediate;   Good Recent;   Good Remote;   Good  Judgement:  Good  Insight:  Good  Psychomotor Activity:   Unable to assess due to telephone visit  Concentration:  Concentration: Good and Attention Span: Good  Recall:  Good  Fund of Knowledge: Good  Language: Good  Akathisia:  No  Handed:  Right  AIMS (if indicated): not done  Assets:  Communication Skills Desire for Improvement Financial Resources/Insurance Housing Physical Health Social Support  ADL's:  Intact  Cognition: WNL  Sleep:  Fair   Screenings: GAD-7    [place] Office Visit from 07/25/2022 in [facility] Video Visit from 01/16/2022  in [facility] Video Visit from 10/22/2021 in [facility] Video Visit from 07/16/2021 in [facility]  Total GAD-7 Score '4 2 4  18      '$ PHQ2-9    [place] Office Visit from 07/25/2022 in [facility] Office Visit from 07/18/2022 in [place] Office Visit from 03/27/2022 in [place] Video Visit from 01/16/2022 in [facility] Video Visit from 10/22/2021 in [facility]  PHQ-2 Total Score 0 0 0 0 1  PHQ-9 Total Score 6 -- -- 1 5        Assessment and Plan: Patient Dors is poor sleep however notes her anxiety, mood, and depression are well managed. Today she is agreeable to increase from mirtazapine 15 mg to 30 mg to help manage sleep.  At this time she does not wish to restart Wellbutrin.  She will continue all other medications as prescribed.  1. Bipolar disorder in full remission, most recent episode unspecified type ([place])  Continue- gabapentin (NEURONTIN) 400 MG capsule; Take 1 capsule (400 mg total) by mouth 3 (three) times daily.  Dispense: 90 capsule; Refill: 3 Continue- lamoTRIgine (LAMICTAL) 100 MG tablet; Take 1 tablet (100 mg total) by mouth daily.  Dispense: 90 tablet; Refill: 3 Continue- QUEtiapine (SEROQUEL XR) 400 MG 24 hr tablet; Take 2 tablets (800 mg total) by mouth at bedtime. Take 2 tablets at bedtime (800)  Dispense: 60 tablet; Refill: 3  2. Anxiety  Continue- gabapentin (NEURONTIN) 400 MG capsule; Take 1 capsule (400 mg total) by mouth 3 (three) times daily.  Dispense: 90 capsule; Refill: 3 Increased- mirtazapine (REMERON SOL-TAB) 30 MG disintegrating tablet; DISSOLVE 1 TABLET(15 MG) ON THE TONGUE AT BEDTIME  Dispense: 30 tablet; Refill: 3  3. Tobacco use disorder  Continue- Nicotine (NICOTROL NS) 10 MG/ML SOLN; USE 1 SPRAY BY NASAL ROUTE AS NEEDED FOR NICOTINE CRAVINGS Strength: 10 mg/mL  Dispense: 40 mL; Refill: 3    Follow up in 3 months Follow up with therapy  Salley Slaughter, NP 07/25/2022, 1:09 PM

## 2022-08-12 ENCOUNTER — Encounter: Payer: Medicare HMO | Admitting: Student

## 2022-08-12 ENCOUNTER — Ambulatory Visit: Payer: 59

## 2022-09-07 ENCOUNTER — Other Ambulatory Visit: Payer: Self-pay

## 2022-09-07 DIAGNOSIS — M62838 Other muscle spasm: Secondary | ICD-10-CM

## 2022-09-09 ENCOUNTER — Emergency Department (HOSPITAL_BASED_OUTPATIENT_CLINIC_OR_DEPARTMENT_OTHER)
Admission: EM | Admit: 2022-09-09 | Discharge: 2022-09-09 | Disposition: A | Discharge status: Home / Self Care | Payer: 59 | Attending: Emergency Medicine | Admitting: Emergency Medicine

## 2022-09-09 ENCOUNTER — Other Ambulatory Visit: Payer: Self-pay

## 2022-09-09 ENCOUNTER — Encounter (HOSPITAL_BASED_OUTPATIENT_CLINIC_OR_DEPARTMENT_OTHER): Payer: Self-pay

## 2022-09-09 DIAGNOSIS — R1030 Lower abdominal pain, unspecified: Secondary | ICD-10-CM | POA: Insufficient documentation

## 2022-09-09 DIAGNOSIS — L292 Pruritus vulvae: Secondary | ICD-10-CM | POA: Diagnosis present

## 2022-09-09 DIAGNOSIS — B3731 Acute candidiasis of vulva and vagina: Secondary | ICD-10-CM | POA: Diagnosis not present

## 2022-09-09 LAB — URINALYSIS, ROUTINE W REFLEX MICROSCOPIC
Bilirubin Urine: NEGATIVE
Glucose, UA: NEGATIVE mg/dL
Ketones, ur: NEGATIVE mg/dL
Leukocytes,Ua: NEGATIVE
Nitrite: NEGATIVE
Specific Gravity, Urine: 1.022 (ref 1.005–1.030)
pH: 6 (ref 5.0–8.0)

## 2022-09-09 LAB — WET PREP, GENITAL
Clue Cells Wet Prep HPF POC: NONE SEEN
Sperm: NONE SEEN
Trich, Wet Prep: NONE SEEN
WBC, Wet Prep HPF POC: <10 (ref <10)

## 2022-09-09 MED ORDER — FLUCONAZOLE 150 MG PO TABS: 150.0000 mg | ORAL_TABLET | Freq: Once | ORAL | 0 refills | Status: DC

## 2022-09-09 MED ORDER — FLUCONAZOLE 150 MG PO TABS: 150.0000 mg | ORAL_TABLET | Freq: Once | ORAL | 0 refills | Status: AC

## 2022-09-09 NOTE — ED Notes (Signed)
Discharge paperwork given and verbally understood. 

## 2022-09-09 NOTE — ED Triage Notes (Signed)
Pt states that she has been having vaginal itching and burning. She is concerned for yeast infection. Pt states she tried to go to PCP, but could not get in until 9/27

## 2022-09-09 NOTE — ED Notes (Signed)
Came as Triage notes.

## 2022-09-09 NOTE — ED Provider Notes (Signed)
[facility] EMERGENCY DEPT Provider Note   CSN: [id number] Arrival date & time: 09/09/22  1707     History  Chief Complaint  Patient presents with   Vaginal Itching    Jasmine Jackson is a 52 y.o. female who presents to the ED complaining of vaginal itching onset today. Has associated burning and discharge to the vaginal region.  Denies history of diabetes.  No meds tried prior to arrival.  Has tried douching without relief of her symptoms.  Patient is currently not sexually active.  Has associated lower abdominal pain.  Patient notes that she would like to be tested for STDs at this time.  Denies fever, urinary symptoms, vaginal bleeding, nausea, vomiting, constipation.   The history is provided by the patient. No language interpreter was used.       Home Medications Prior to Admission medications   Medication Sig Start Date End Date Taking? Authorizing Provider  albuterol (VENTOLIN HFA) 108 (90 Base) MCG/ACT inhaler Inhale 2 puffs into the lungs every 6 (six) hours as needed for wheezing or shortness of breath. 09/07/21   Marianna Payment, MD  AMBULATORY NON FORMULARY MEDICATION Medication Name: GI Cocktail 90 ml 2% viscous lidocaine 90 ml bentyl '10mg'$ /69m 270 ml Mylanta SIG 10 ml bid prn gas and stomach pain-do not use more than 3 days in a row 07/11/22   SLadene Artist MD  amLODipine (NORVASC) 5 MG tablet TAKE 1 TABLET EVERY DAY 07/18/22   RIona Coach MD  cyclobenzaprine (FEXMID) 7.5 MG tablet TAKE 1 TABLET(7.5 MG) BY MOUTH THREE TIMES DAILY AS NEEDED FOR MUSCLE SPASMS 09/09/22   RIona Coach MD  diclofenac Sodium (VOLTAREN) 1 % GEL APPLY 2 GRAMS TOPICALLY AS NEEDED 04/11/22   CMarianna Payment MD  fluconazole (DIFLUCAN) 150 MG tablet Take 1 tablet (150 mg total) by mouth once for 1 dose. 09/09/22 09/09/22  Jakobie Henslee A, PA-C  gabapentin (NEURONTIN) 100 MG capsule Take 2 capsules (200 mg total) by mouth 3 (three) times daily. 07/18/22   RIona Coach MD  gabapentin  (NEURONTIN) 400 MG capsule Take 1 capsule (400 mg total) by mouth 3 (three) times daily. 07/25/22   PSalley Slaughter NP  hydrochlorothiazide (HYDRODIURIL) 25 MG tablet TAKE 1 TABLET(25 MG) BY MOUTH DAILY 07/18/22   RIona Coach MD  hydrOXYzine (ATARAX) 50 MG tablet Take 1 tablet (50 mg total) by mouth 3 (three) times daily as needed. 07/01/22   PSalley Slaughter NP  lamoTRIgine (LAMICTAL) 100 MG tablet Take 1 tablet (100 mg total) by mouth daily. 07/25/22   PSalley Slaughter NP  LINZESS 290 MCG CAPS capsule TAKE 1 CAPSULE(290 MCG) BY MOUTH DAILY BEFORE BREAKFAST 04/01/22   SLadene Artist MD  meloxicam (MOBIC) 15 MG tablet TAKE 1 TABLET(15 MG) BY MOUTH DAILY 12/19/21   RWallene Huh DPM  mirtazapine (REMERON SOL-TAB) 30 MG disintegrating tablet DISSOLVE 1 TABLET(15 MG) ON THE TONGUE AT BEDTIME 07/25/22   PEulis CannerE, NP  Nicotine (NICOTROL NS) 10 MG/ML SOLN USE 1 SPRAY BY NASAL ROUTE AS NEEDED FOR NICOTINE CRAVINGS Strength: 10 mg/mL 07/25/22   PEulis CannerE, NP  ondansetron (ZOFRAN-ODT) 8 MG disintegrating tablet Take 1 tablet (8 mg total) by mouth every 8 (eight) hours as needed for nausea or vomiting. 02/06/22   CMarianna Payment MD  pantoprazole (PROTONIX) 40 MG tablet Take 1 tablet (40 mg total) by mouth 2 (two) times daily. 03/06/22   SLadene Artist MD  prochlorperazine (COMPAZINE) 25 MG suppository  Use 1 suppository rectally every 6-8 hours as needed nausea/vomiting 08/20/21   Esterwood, Amy S, PA-C  QUEtiapine (SEROQUEL XR) 400 MG 24 hr tablet Take 2 tablets (800 mg total) by mouth at bedtime. Take 2 tablets at bedtime (800) 07/25/22   Salley Slaughter, NP      Allergies    Asa [aspirin] and Banana    Review of Systems   Review of Systems  Constitutional:  Negative for fever.  Gastrointestinal:  Positive for abdominal pain (lower). Negative for constipation, diarrhea, nausea and vomiting.  Genitourinary:  Positive for vaginal discharge. Negative for dysuria, hematuria  and vaginal bleeding.  All other systems reviewed and are negative.   Physical Exam Updated Vital Signs BP 124/77 (BP Location: Right Arm)   Pulse 82   Temp 98.6 F (37 C) (Oral)   Resp 16   Ht '5\' 6"'$  (1.676 m)   Wt 101.6 kg   SpO2 100%   BMI 36.15 kg/m  Physical Exam Vitals and nursing note reviewed. Exam conducted with a chaperone present.  Constitutional:      General: She is not in acute distress.    Appearance: Normal appearance. She is not ill-appearing, toxic-appearing or diaphoretic.  HENT:     Head: Normocephalic and atraumatic.     Right Ear: External ear normal.     Left Ear: External ear normal.  Eyes:     General: No scleral icterus.    Extraocular Movements: Extraocular movements intact.  Cardiovascular:     Rate and Rhythm: Normal rate and regular rhythm.     Pulses: Normal pulses.     Heart sounds: Normal heart sounds.  Pulmonary:     Effort: Pulmonary effort is normal. No respiratory distress.     Breath sounds: Normal breath sounds.  Abdominal:     General: Abdomen is flat. Bowel sounds are normal. There is no distension.     Palpations: Abdomen is soft. There is no mass.     Tenderness: There is no abdominal tenderness.     Hernia: There is no hernia in the left inguinal area or right inguinal area.  Genitourinary:    Pubic Area: No rash.      Labia:        Right: No rash, tenderness, lesion or injury.        Left: No rash, tenderness, lesion or injury.      Vagina: No signs of injury and foreign body. Vaginal discharge present. No erythema, tenderness or bleeding.     Uterus: Not deviated, not enlarged, not fixed and not tender.      Adnexa: Right adnexa normal and left adnexa normal.     Comments: NT chaperone present for exam.  White vaginal discharge noted on exam. Musculoskeletal:        General: Normal range of motion.     Cervical back: Normal range of motion and neck supple.  Lymphadenopathy:     Lower Body: No right inguinal adenopathy.  No left inguinal adenopathy.  Skin:    General: Skin is warm and dry.  Neurological:     Mental Status: She is alert.     ED Results / Procedures / Treatments   Labs (all labs ordered are listed, but only abnormal results are displayed) Labs Reviewed  WET PREP, GENITAL - Abnormal; Notable for the following components:      Result Value   Yeast Wet Prep HPF POC PRESENT (*)    All other components within normal limits  URINALYSIS, ROUTINE W REFLEX MICROSCOPIC - Abnormal; Notable for the following components:   Hgb urine dipstick SMALL (*)    Protein, ur TRACE (*)    All other components within normal limits  RPR  HIV ANTIBODY (ROUTINE TESTING W REFLEX)  GC/CHLAMYDIA PROBE AMP ([place]) NOT AT [facility]    EKG None  Radiology No results found.  Procedures Procedures    Medications Ordered in ED Medications - No data to display  ED Course/ Medical Decision Making/ A&P Clinical Course as of 09/09/22 2212  Mon Sep 09, 2022  2059 Yeast Wet Prep HPF POC(!): PRESENT [SB]    Clinical Course User Index [SB] Winni Ehrhard A, PA-C                           Medical Decision Making Amount and/or Complexity of Data Reviewed Labs: ordered. Decision-making details documented in ED Course.  Risk Prescription drug management.   Pt presents with concerns for yeast infection onset today.  Denies history of diabetes.  Not sexually active at this time.  Patient afebrile.  On exam patient with white vaginal discharge noted on GU exam with NT chaperone present. Differential diagnosis includes yeast infection, bacterial vaginosis, trichomonas.    Labs:  I ordered, and personally interpreted labs.  The pertinent results include:   GC/chlamydia, HIV, RPR ordered with results pending at time of discharge. Yeast noted on wet prep. Urinalysis with small amount of hemoglobin otherwise unremarkable   Disposition: Presentation suspicious for vulvovaginitis candidiasis.  Doubt bacterial  vaginosis or trichomonas at this time.  Patient has pending results for STD work-up.  After consideration of the diagnostic results and the patients response to treatment, I feel that the patient would benefit from Discharge home.  Patient will be sent home with a prescription for Diflucan.  Patient aware that she has pending STI results.  Supportive care measures and strict return precautions discussed with patient at bedside. Pt acknowledges and verbalizes understanding. Pt appears safe for discharge. Follow up as indicated in discharge paperwork.    This chart was dictated using voice recognition software, Dragon. Despite the best efforts of this provider to proofread and correct errors, errors may still occur which can change documentation meaning.   Final Clinical Impression(s) / ED Diagnoses Final diagnoses:  Vulvovaginitis due to yeast    Rx / DC Orders ED Discharge Orders          Ordered    fluconazole (DIFLUCAN) 150 MG tablet   Once,   Status:  Discontinued        09/09/22 2103    fluconazole (DIFLUCAN) 150 MG tablet   Once        09/09/22 2105              Elva Mauro A, PA-C 09/09/22 2212    Tretha Sciara, MD 09/09/22 2329

## 2022-09-09 NOTE — Discharge Instructions (Addendum)
It was a pleasure taking care of you today!  Your swab showed concern for yeast infection today. You have pending lab results for STI work-up.  You may see the results of your labs on MyChart, you will be notified of any positive results.  You may follow up with your primary care provider or Health Department if you are experiencing continued symptoms.  If you have future STI concerns you may follow-up with the regional [facility] [phone]).  This location provides testing and treatment for STI and prep services.  This is free for individuals without insurance.  Appointments are required and typically you can get in on the same day.  Ensure to practice safe sex with condom use. Return to the Emergency Department if you are experiencing increasing/worsening symptoms.

## 2022-09-09 NOTE — Telephone Encounter (Signed)
Call to Pharmacy-patient has last refill awaiting pick up today.  Other refills will placed on file.

## 2022-09-10 LAB — GC/CHLAMYDIA PROBE AMP (~~LOC~~) NOT AT ARMC
Chlamydia: NEGATIVE
Comment: NEGATIVE
Comment: NORMAL
Neisseria Gonorrhea: NEGATIVE

## 2022-09-10 LAB — HIV ANTIBODY (ROUTINE TESTING W REFLEX): HIV Screen 4th Generation wRfx: NONREACTIVE

## 2022-09-10 LAB — RPR: RPR Ser Ql: NONREACTIVE

## 2022-09-18 ENCOUNTER — Other Ambulatory Visit: Payer: Self-pay | Admitting: Podiatry

## 2022-10-07 ENCOUNTER — Other Ambulatory Visit: Payer: Self-pay | Admitting: Internal Medicine

## 2022-10-07 DIAGNOSIS — Z720 Tobacco use: Secondary | ICD-10-CM

## 2022-10-16 ENCOUNTER — Telehealth (HOSPITAL_COMMUNITY): Payer: 59 | Admitting: Psychiatry

## 2022-10-16 ENCOUNTER — Ambulatory Visit (INDEPENDENT_AMBULATORY_CARE_PROVIDER_SITE_OTHER): Payer: 59 | Admitting: Student in an Organized Health Care Education/Training Program

## 2022-10-16 ENCOUNTER — Encounter (HOSPITAL_COMMUNITY): Payer: Self-pay | Admitting: Student in an Organized Health Care Education/Training Program

## 2022-10-16 DIAGNOSIS — F419 Anxiety disorder, unspecified: Secondary | ICD-10-CM

## 2022-10-16 DIAGNOSIS — F3170 Bipolar disorder, currently in remission, most recent episode unspecified: Secondary | ICD-10-CM | POA: Diagnosis not present

## 2022-10-16 MED ORDER — GABAPENTIN 400 MG PO CAPS: 400.0000 mg | ORAL_CAPSULE | Freq: Three times a day (TID) | ORAL | 3 refills | Status: DC

## 2022-10-16 MED ORDER — MIRTAZAPINE 30 MG PO TBDP: 30.0000 mg | ORAL_TABLET | Freq: Every day | ORAL | 3 refills | Status: DC

## 2022-10-16 MED ORDER — QUETIAPINE FUMARATE 400 MG PO TABS: 400.0000 mg | ORAL_TABLET | Freq: Every day | ORAL | 3 refills | Status: DC

## 2022-10-16 MED ORDER — LAMOTRIGINE 100 MG PO TABS: 100.0000 mg | ORAL_TABLET | Freq: Every day | ORAL | 3 refills | Status: DC

## 2022-10-16 MED ORDER — QUETIAPINE FUMARATE ER 400 MG PO TB24: 800.0000 mg | ORAL_TABLET | Freq: Every day | ORAL | 3 refills | Status: DC

## 2022-10-16 NOTE — Progress Notes (Signed)
Jasmine North MD/PA/NP OP Progress Note  10/16/2022 6:25 PM Jasmine Jackson  MRN:  [id number]  Chief Complaint:  Chief Complaint  Patient presents with   Follow-up   HPI: Virtual Visit via Telephone Note  I connected with Jasmine Jackson on 10/16/22 at  3:30 PM EDT by telephone and verified that I am speaking with the correct person using two identifiers.  Location: Patient: Home Provider: Office   I discussed the limitations, risks, security and privacy concerns of performing an evaluation and management service by telephone and the availability of in person appointments. I also discussed with the patient that there may be a patient responsible charge related to this service. The patient expressed understanding and agreed to proceed.   History of Present Illness:   Jasmine Jackson is a 51 yo with PPH bipolar disorder, PTSD, anxiety, panic attacks, and depression.  Patient reports she has been compliant with the following medication regimen:  Gabapentin 400 mg 3 times daily Lamictal 100 mg daily Seroquel XR 800 mg nightly Seroquel 400 mg nightly Remeron 30 mg nightly  Patient reports she is no longer using Nicotrol NS for tobacco cessation.  Patient reports that she has been doing okay in regards to her mood however she has been stressed by her mother's health decline and behaviors.  Patient reports that overall she has been sleeping well, denies anhedonia, denies poor energy, denies poor concentration and endorses a good appetite.  Patient reports she is at emptying to be more conscious about what she eats based on her PCP recommendations.  Patient reports she is also been attending doing better, walking daily and is looking forward to starting to bike ride again.  Patient denies any SI, HI or AVH.  Patient reports that she has been using her coping skills that she has learned in the past to deal with the stressors that her mother is causing.  Patient reports that she also has not had any  panic attacks in at least the last 2 weeks.  Patient reports that normally when she has a panic attack her thoughts begin to race, she has diaphoresis, tingling sensation and increased heart rate.  Patient reports she has been able to identify that her mother has been her trigger for most of her panic attacks.  Patient reports she is overall concerned about her mother's safety.  Patient reports that she has not had any manic episodes recently while her medication but can recall having racing thoughts, increased irritability, staying up for at least 2 days straight and becoming more isolative when she is not taking the Seroquel and Lamictal.  Patient endorses significant improvement on both these medications.  Patient reports that she recalls being diagnosed with OCD in the past.  Patient reports that in regards to her OCD, she has a history of counting.  Patient reports that she has to count the cars behind her when she is at a stoplight and but gets frustrated when they "multiply" as new cars pulled up.  Patient reports that she does not believe anything bad would happen if she cannot count them, but endorses that she knows she has to count them.  Patient reports that she also has a history of having significant difficulty choosing a glass to drink out of and has compensated by getting identical glasses.  Patient reports that prior to this modification, she was spending excessive periods of time debating what glass to drink out of.  Patient reports that her counting behavior has also extended  to, walking down the halls and counting pictures.  Patient reports that this feels better has "they do not multiply."  Patient reports that she is not too concerned about these behaviors because "they are not really that harmful."   I discussed the assessment and treatment plan with the patient. The patient was provided an opportunity to ask questions and all were answered. The patient agreed with the plan and  demonstrated an understanding of the instructions.   The patient was advised to call back or seek an in-person evaluation if the symptoms worsen or if the condition fails to improve as anticipated.  I provided 30 minutes of non-face-to-face time during this encounter.   Jasmine Busman, MD  Visit Diagnosis:    ICD-10-CM   1. Bipolar disorder in full remission, most recent episode unspecified type ([place])  F31.70 gabapentin (NEURONTIN) 400 MG capsule    lamoTRIgine (LAMICTAL) 100 MG tablet    QUEtiapine (SEROQUEL XR) 400 MG 24 hr tablet    QUEtiapine (SEROQUEL) 400 MG tablet    mirtazapine (REMERON SOL-TAB) 30 MG disintegrating tablet    2. Anxiety  F41.9 gabapentin (NEURONTIN) 400 MG capsule      Past Psychiatric History: Bipolar disorder, PTSD, Anxiety, Tobacco use, OCD-like behaviors  Past Medical History:  Past Medical History:  Diagnosis Date   Anemia    06/19/21-pt has no recollection of this dx   Anxiety    Anxiety and depression    Asthma    Bipolar 1 disorder ([place])    Depression    Excessive daytime sleepiness 11/18/2017   GERD (gastroesophageal reflux disease)    Heart murmur    Hemorrhoids    Hiatal hernia    Hypertension    Internal hemorrhoids    Irritable bowel disease 2014   Toe injury 11/18/2017    Past Surgical History:  Procedure Laterality Date   COLONOSCOPY  2021   HEMORRHOID SURGERY     INGUINAL HERNIA REPAIR     PARTIAL HYSTERECTOMY     fibroids   TONSILLECTOMY     UPPER GASTROINTESTINAL ENDOSCOPY      Family Psychiatric History: Son autism  Family History:  Family History  Problem Relation Age of Onset   Breast cancer Maternal Aunt        x4   Stomach cancer Maternal Aunt    Prostate cancer Maternal Uncle        x2   Breast cancer Maternal Grandmother    Breast cancer Mother 30   Colon polyps Mother 66   Breast cancer Paternal Grandmother    Colon cancer Neg Hx    Esophageal cancer Neg Hx    Rectal cancer Neg Hx     Social  History:  Social History   Socioeconomic History   Marital status: Single    Spouse name: Not on file   Number of children: 1   Years of education: Not on file   Highest education level: Not on file  Occupational History   Occupation: Disability  Tobacco Use   Smoking status: Every Day    Packs/day: 0.50    Years: 37.00    Total pack years: 18.50    Types: Cigarettes   Smokeless tobacco: Never   Tobacco comments:    10  CIG A DAY  Vaping Use   Vaping Use: Never used  Substance and Sexual Activity   Alcohol use: No    Alcohol/week: 0.0 standard drinks of alcohol   Drug use: No  Sexual activity: Yes    Birth control/protection: Condom    Comment: both  Other Topics Concern   Not on file  Social History Narrative   Not on file   Social Determinants of Health   Financial Resource Strain: Not on file  Food Insecurity: Not on file  Transportation Needs: Not on file  Physical Activity: Not on file  Stress: Not on file  Social Connections: Not on file    Allergies:  Allergies  Allergen Reactions   Asa [Aspirin] Other (See Comments)    Heart flutters   Banana Itching    Metabolic Disorder Labs: Lab Results  Component Value Date   HGBA1C 5.4 11/16/2019   No results found for: "PROLACTIN" Lab Results  Component Value Date   CHOL 159 04/24/2015   TRIG 149.0 04/24/2015   HDL 40.30 04/24/2015   CHOLHDL 4 04/24/2015   VLDL 29.8 04/24/2015   LDLCALC 89 04/24/2015   Lab Results  Component Value Date   TSH 1.06 05/29/2021   TSH 0.771 07/31/2020    Therapeutic Level Labs: No results found for: "LITHIUM" No results found for: "VALPROATE" No results found for: "CBMZ"  Current Medications: Current Outpatient Medications  Medication Sig Dispense Refill   mirtazapine (REMERON SOL-TAB) 30 MG disintegrating tablet Take 1 tablet (30 mg total) by mouth at bedtime. 30 tablet 3   QUEtiapine (SEROQUEL) 400 MG tablet Take 1 tablet (400 mg total) by mouth at bedtime.  30 tablet 3   albuterol (VENTOLIN HFA) 108 (90 Base) MCG/ACT inhaler Inhale 2 puffs into the lungs every 6 (six) hours as needed for wheezing or shortness of breath. 3 each 0   AMBULATORY NON FORMULARY MEDICATION Medication Name: GI Cocktail 90 ml 2% viscous lidocaine 90 ml bentyl '10mg'$ /66m 270 ml Mylanta SIG 10 ml bid prn gas and stomach pain-do not use more than 3 days in a row 600 mL 2   amLODipine (NORVASC) 5 MG tablet TAKE 1 TABLET EVERY DAY 90 tablet 1   buPROPion (WELLBUTRIN XL) 150 MG 24 hr tablet TAKE 1 TABLET(150 MG) BY MOUTH DAILY 30 tablet 2   cyclobenzaprine (FEXMID) 7.5 MG tablet TAKE 1 TABLET(7.5 MG) BY MOUTH THREE TIMES DAILY AS NEEDED FOR MUSCLE SPASMS 30 tablet 3   diclofenac Sodium (VOLTAREN) 1 % GEL APPLY 2 GRAMS TOPICALLY AS NEEDED 100 g 2   gabapentin (NEURONTIN) 100 MG capsule Take 2 capsules (200 mg total) by mouth 3 (three) times daily. 180 capsule 2   gabapentin (NEURONTIN) 400 MG capsule Take 1 capsule (400 mg total) by mouth 3 (three) times daily. 90 capsule 3   hydrochlorothiazide (HYDRODIURIL) 25 MG tablet TAKE 1 TABLET(25 MG) BY MOUTH DAILY 90 tablet 1   hydrOXYzine (ATARAX) 50 MG tablet Take 1 tablet (50 mg total) by mouth 3 (three) times daily as needed. 90 tablet 3   lamoTRIgine (LAMICTAL) 100 MG tablet Take 1 tablet (100 mg total) by mouth daily. 30 tablet 3   LINZESS 290 MCG CAPS capsule TAKE 1 CAPSULE(290 MCG) BY MOUTH DAILY BEFORE BREAKFAST 90 capsule 1   meloxicam (MOBIC) 15 MG tablet TAKE 1 TABLET(15 MG) BY MOUTH DAILY 30 tablet 2   mirtazapine (REMERON SOL-TAB) 30 MG disintegrating tablet DISSOLVE 1 TABLET(15 MG) ON THE TONGUE AT BEDTIME 30 tablet 3   Nicotine (NICOTROL NS) 10 MG/ML SOLN USE 1 SPRAY BY NASAL ROUTE AS NEEDED FOR NICOTINE CRAVINGS Strength: 10 mg/mL 40 mL 3   ondansetron (ZOFRAN-ODT) 8 MG disintegrating tablet Take 1 tablet (  8 mg total) by mouth every 8 (eight) hours as needed for nausea or vomiting. 30 tablet 0   pantoprazole (PROTONIX) 40 MG  tablet Take 1 tablet (40 mg total) by mouth 2 (two) times daily. 180 tablet 3   prochlorperazine (COMPAZINE) 25 MG suppository Use 1 suppository rectally every 6-8 hours as needed nausea/vomiting 12 suppository 3   QUEtiapine (SEROQUEL XR) 400 MG 24 hr tablet Take 2 tablets (800 mg total) by mouth at bedtime. Take 2 tablets at bedtime (800) 60 tablet 3   No current facility-administered medications for this visit.     Musculoskeletal: defer Psychiatric Specialty Exam: Review of Systems  Psychiatric/Behavioral:  Negative for hallucinations and suicidal ideas. The patient is nervous/anxious.     There were no vitals taken for this visit.There is no height or weight on file to calculate BMI.  General Appearance: NA  Eye Contact:  NA  Speech:  Negative and Clear and Coherent  Volume:  Normal  Mood:  Euthymic  Affect:  NA  Thought Process:  Coherent  Orientation:  Full (Time, Place, and Person)  Thought Content: Logical   Suicidal Thoughts:  No  Homicidal Thoughts:  No  Memory:  Immediate;   Good Recent;   Good  Judgement:  Fair  Insight:  Good  Psychomotor Activity:  NA  Concentration:  Concentration: Good  Recall:  NA  Fund of Knowledge: Good  Language: Good  Akathisia:  No  Handed:    AIMS (if indicated): not done  Assets:  Communication Skills Desire for Improvement Housing Resilience  ADL's:  Intact  Cognition: WNL  Sleep:  Good   Screenings: GAD-7    Flowsheet Row Office Visit from 07/25/2022 in [facility] Video Visit from 01/16/2022 in [facility] Video Visit from 10/22/2021 in [facility] Video Visit from 07/16/2021 in [facility]  Total GAD-7 Score '4 2 4 18      '$ PHQ2-9    [facility] Office Visit from 07/25/2022 in [facility] Office Visit from 07/18/2022 in [facility] Office Visit from  03/27/2022 in [facility] Video Visit from 01/16/2022 in [facility] Video Visit from 10/22/2021 in [facility]  PHQ-2 Total Score 0 0 0 0 1  PHQ-9 Total Score 6 -- -- 1 [facility] from 09/09/2022 in [facility]  C-SSRS RISK CATEGORY No Risk        Assessment and Plan:   Jasmine Jackson is a 51 yo with PPH bipolar disorder, PTSD, anxiety, panic attacks, and depression.  Regards to patient's reporting OCD diagnosis, it appears that patient does have some counting behaviors however it is not significantly detrimental to her life.  We will not add OCD as a diagnosis at this time.  Patient also does not appear to criteria for [place] PD as she does not endorse any perfectionistic behaviors obsession with symmetry.  Patient overall appears to be fairly stable on her current regimen despite having acute stressors, she continues to do well.  Bipolar disorder in full mission - Continue Lamictal 100 mg daily - Continue Seroquel XR 800 mg nightly and Seroquel 400 mg nightly - Continue Remeron 30 mg nightly  Tobacco use disorder, mild Hx anxiety - Continue gabapentin 400 mg 3 times daily - Continue mirtazapine per above  Follow-up in approximately 3  months  Collaboration of Care: Collaboration of Care:   Patient/Guardian was advised Release of Information must be obtained prior to any record release in order to collaborate their care with an outside provider. Patient/Guardian was advised if they have not already done so to contact the registration department to sign all necessary forms in order for [place] to release information regarding their care.   Consent: Patient/Guardian gives verbal consent for treatment and assignment of benefits for services provided during this visit. Patient/Guardian expressed understanding and agreed to proceed.   PGY-3 Jasmine Busman, MD 10/16/2022, 6:25  PM

## 2022-10-19 ENCOUNTER — Other Ambulatory Visit: Payer: Self-pay

## 2022-10-19 DIAGNOSIS — M5431 Sciatica, right side: Secondary | ICD-10-CM

## 2022-10-30 ENCOUNTER — Telehealth (HOSPITAL_COMMUNITY): Payer: Self-pay | Admitting: *Deleted

## 2022-10-30 NOTE — Telephone Encounter (Signed)
Patient LVM stated that she missed your call & requested you to  call her back

## 2022-10-31 ENCOUNTER — Telehealth (HOSPITAL_COMMUNITY): Payer: Self-pay | Admitting: Student in an Organized Health Care Education/Training Program

## 2022-10-31 NOTE — Telephone Encounter (Signed)
Called patient back to discuss decreasing her Seroquel. Patient agreed to decrease her Seroquel XR to '300mg'$  for 1 week then to '200mg'$  following that.    PGY-3 Damita Dunnings

## 2022-11-01 ENCOUNTER — Other Ambulatory Visit: Payer: Self-pay

## 2022-11-01 DIAGNOSIS — M62838 Other muscle spasm: Secondary | ICD-10-CM

## 2022-11-06 ENCOUNTER — Telehealth (HOSPITAL_COMMUNITY): Payer: Self-pay | Admitting: *Deleted

## 2022-11-06 NOTE — Telephone Encounter (Signed)
Patient called stated that changes were made to her Seroquel dosages & that she tales regular & XR. And that she has been taking less of the Regular not the XR.  She requested call back for clarification

## 2022-11-08 NOTE — Telephone Encounter (Signed)
Called back and clarified "Seroquel regular" to which patient confirmed this is the one she had already started cutting in half. Confirmed that this was appropriate.

## 2022-11-19 ENCOUNTER — Telehealth: Payer: Self-pay | Admitting: Podiatry

## 2022-11-19 NOTE — Telephone Encounter (Signed)
Pt calling for a refill of meloxicam.  [id number] - [place], [address] AT [address]   Please advise

## 2022-11-19 NOTE — Telephone Encounter (Signed)
Patient needs to be seen due to her have not been seen in 6 months

## 2022-11-20 NOTE — Telephone Encounter (Signed)
Noted, thanks!

## 2022-12-03 ENCOUNTER — Ambulatory Visit (INDEPENDENT_AMBULATORY_CARE_PROVIDER_SITE_OTHER): Payer: 59 | Admitting: Podiatry

## 2022-12-03 DIAGNOSIS — Z91199 Patient's noncompliance with other medical treatment and regimen due to unspecified reason: Secondary | ICD-10-CM

## 2022-12-04 NOTE — Progress Notes (Signed)
Patient was no-show for appointment today 

## 2022-12-06 ENCOUNTER — Telehealth (HOSPITAL_COMMUNITY): Payer: Self-pay | Admitting: Student in an Organized Health Care Education/Training Program

## 2022-12-06 DIAGNOSIS — G4700 Insomnia, unspecified: Secondary | ICD-10-CM

## 2022-12-06 MED ORDER — TRAZODONE HCL 50 MG PO TABS: 50.0000 mg | ORAL_TABLET | Freq: Every evening | ORAL | 2 refills | Status: DC | PRN

## 2022-12-06 NOTE — Telephone Encounter (Signed)
Patient called reporting that for the last 3 weeks she has been having problems falling and staying asleep.  Patient endorsed that it takes her approximately 3 hours to fall asleep and she is not staying asleep very long.  Patient reports that around noon of the next day she begins to have very little energy and is essentially "dragging."  Patient provider discussed trazodone.  Patient denied that she was feeling as though she had high energy and was not endorsing the symptoms of a manic episode.  Patient reports she never been on trazodone before, adverse side effects of trazodone were discussed, patient endorsed understanding.  Patient endorsed understanding that if she suddenly feels as though she has high energy with little sleep, or continues to have issues with sleep she should report to the behavioral health urgent care.  -Start trazodone 25-50 mg nightly as needed

## 2022-12-09 ENCOUNTER — Other Ambulatory Visit: Payer: Self-pay | Admitting: Gastroenterology

## 2022-12-24 ENCOUNTER — Ambulatory Visit (INDEPENDENT_AMBULATORY_CARE_PROVIDER_SITE_OTHER): Payer: 59 | Admitting: Gastroenterology

## 2022-12-24 ENCOUNTER — Encounter: Payer: Self-pay | Admitting: Gastroenterology

## 2022-12-24 VITALS — BP 124/78 | HR 67 | Ht 66.0 in | Wt 227.0 lb

## 2022-12-24 DIAGNOSIS — R1033 Periumbilical pain: Secondary | ICD-10-CM

## 2022-12-24 DIAGNOSIS — K760 Fatty (change of) liver, not elsewhere classified: Secondary | ICD-10-CM | POA: Diagnosis not present

## 2022-12-24 DIAGNOSIS — K8070 Calculus of gallbladder and bile duct without cholecystitis without obstruction: Secondary | ICD-10-CM

## 2022-12-24 NOTE — Patient Instructions (Signed)
_______________________________________________________  If you are age 53 or older, your body mass index should be between 23-30. Your Body mass index is 36.64 kg/m. If this is out of the aforementioned range listed, please consider follow up with your Primary Care Provider.  If you are age 98 or younger, your body mass index should be between 19-25. Your Body mass index is 36.64 kg/m. If this is out of the aformentioned range listed, please consider follow up with your Primary Care Provider.   ________________________________________________________  The  GI providers would like to encourage you to use [facility] to communicate with providers for non-urgent requests or questions.  Due to long hold times on the telephone, sending your provider a message by [facility] may be a faster and more efficient way to get a response.  Please allow 48 business hours for a response.  Please remember that this is for non-urgent requests.  _______________________________________________________  Please follow up in one year

## 2022-12-24 NOTE — Progress Notes (Signed)
    Assessment     Cholelithiasis, asymptomatic IBS-C GERD with small hiatal hernia and Schatzki ring Hepatic steatosis, hepatomegaly BMI=36.64 Hemorrhoid banding performed for grade 3 internal hemorrhoids in 2022   Recommendations    Continue Linzess 290 mcg daily, dicyclomine 10 mg 4 times daily as needed abdominal pain bloating Continue pantoprazole 40 mg bid, sucralfate 1 g tid AC, GI cocktail bid prn Long-term carb modified, fat modified, weight loss diet supervised by PCP REV in 1 year   HPI    This is a 52 year old female complaining of intermittent periumbilical pain, frequent abdominal bloating and constipation.  She was evaluated by her PCP and underwent an abdominal ultrasound, blood work and H pylori breath test in 10/2022 and 11/2022. H pylori breat test was negative. Abd [place] hepatomegaly, hepatic steatosis and cholelithiasis.  Her periumbilical pain and abdominal bloating respond to dicyclomine.  She does not describe typical biliary colic.    Labs / Imaging    Abd [place] 11/25/2022 IMPRESSION:  Hepatomegaly and hepatic steatosis.  Cholelithiasis without sonographic evidence of cholecystitis.        Latest Ref Rng & Units 03/27/2022    3:22 PM 05/29/2021    4:34 PM 07/31/2020    2:45 PM  Hepatic Function  Total Protein 6.0 - 8.3 g/dL  8.4  7.4   Albumin 3.8 - 4.9 g/dL 4.9  4.9  4.9   AST 0 - 37 U/L  23  18   ALT 0 - 35 U/L  18  18   Alk Phosphatase 39 - 117 U/L  82  100   Total Bilirubin 0.2 - 1.2 mg/dL  0.3  0.3        Latest Ref Rng & Units 05/29/2021    4:34 PM 07/31/2020    2:45 PM 11/11/2019    5:52 PM  CBC  WBC 4.0 - 10.5 K/uL 5.6  7.3  15.9   Hemoglobin 12.0 - 15.0 g/dL 13.8  13.4  15.2   Hematocrit 36.0 - 46.0 % 40.4  40.5  45.9   Platelets 150.0 - 400.0 K/uL 338.0  411  442    Current Medications, Allergies, Past Medical History, Past Surgical History, Family History and Social History were reviewed in [employer]  record.   Physical Exam: General: Well developed, well nourished, no acute distress Head: Normocephalic and atraumatic Eyes: Sclerae anicteric, EOMI Ears: Normal auditory acuity Mouth: No deformities or lesions noted Lungs: Clear throughout to auscultation Heart: Regular rate and rhythm; No murmurs, rubs or bruits Abdomen: Soft, non tender and non distended. No masses, hepatosplenomegaly or hernias noted. Normal Bowel sounds Rectal: Not done Musculoskeletal: Symmetrical with no gross deformities  Pulses:  Normal pulses noted Extremities: No edema or deformities noted Neurological: Alert oriented x 4, grossly nonfocal Psychological:  Alert and cooperative. Normal mood and affect   Minard Millirons T. Fuller Plan, MD 12/24/2022, 2:17 PM

## 2022-12-26 ENCOUNTER — Ambulatory Visit (INDEPENDENT_AMBULATORY_CARE_PROVIDER_SITE_OTHER): Payer: 59 | Admitting: Podiatry

## 2022-12-26 DIAGNOSIS — S86311A Strain of muscle(s) and tendon(s) of peroneal muscle group at lower leg level, right leg, initial encounter: Secondary | ICD-10-CM | POA: Diagnosis not present

## 2022-12-26 MED ORDER — METHYLPREDNISOLONE 4 MG PO TBPK: ORAL_TABLET | ORAL | 0 refills | Status: DC

## 2022-12-26 MED ORDER — DICLOFENAC SODIUM 75 MG PO TBEC: 75.0000 mg | DELAYED_RELEASE_TABLET | Freq: Two times a day (BID) | ORAL | 2 refills | Status: DC

## 2022-12-26 NOTE — Patient Instructions (Addendum)
Call to schedule physical therapy: [facility] at [place]  [phone]     Peroneal Tendinopathy Rehab Ask your health care provider which exercises are safe for you. Do exercises exactly as told by your health care provider and adjust them as directed. It is normal to feel mild stretching, pulling, tightness, or discomfort as you do these exercises. Stop right away if you feel sudden pain or your pain gets worse. Do not begin these exercises until told by your health care provider. Stretching and range-of-motion exercises These exercises warm up your muscles and joints and improve the movement and flexibility of your ankle. These exercises also help to relieve pain and stiffness. Gastroc and soleus stretch, standing  This is an exercise in which you stand on a step and use your body weight to stretch your calf muscles. To do this exercise: Stand on the edge of a step on the ball of your left / right foot. The ball of your foot is on the walking surface, right under your toes. Keep your other foot firmly on the same step. Hold on to the wall, a railing, or a chair for balance. Slowly lift your other foot, allowing your body weight to press your left / right heel down over the edge of the step. You should feel a stretch in your left / right calf (gastrocnemius and soleus). Hold this position for 15 seconds. Return both feet to the step. Repeat this exercise with a slight bend in your left / right knee. Repeat 5 times with your left / right knee straight and 5 times with your left / right knee bent. Complete this exercise 2 times a day. Strengthening exercises These exercises build strength and endurance in your foot and ankle. Endurance is the ability to use your muscles for a long time, even after they get tired. Ankle dorsiflexion with band   Secure a rubber exercise band or tube to an object, such as a table leg, that  will not move when the band is pulled. Secure the other end of the band around your left / right foot. Sit on the floor, facing the object with your left / right leg extended. The band or tube should be slightly tense when your foot is relaxed. Slowly flex your left / right ankle and toes to bring your foot toward you (dorsiflexion). Hold this position for 15 seconds. Let the band or tube slowly pull your foot back to the starting position. Repeat 5 times. Complete this exercise 2 times a day. Ankle eversion Sit on the floor with your legs straight out in front of you. Loop a rubber exercise band or tube around the ball of your left / right foot. The ball of your foot is on the walking surface, right under your toes. Hold the ends of the band in your hands, or secure the band to a stable object. The band or tube should be slightly tense when your foot is relaxed. Slowly push your foot outward, away from your other leg (eversion). Hold this position for 15 seconds. Slowly return your foot to the starting position. Repeat 5 times. Complete this exercise 2 times a day. Plantar flexion, standing  This exercise is sometimes called standing heel raise. Stand with your feet shoulder-width apart. Place your hands on a wall or table to steady yourself as needed, but try not to use it for support. Keep your weight spread evenly over the width  of your feet while you slowly rise up on your toes (plantar flexion). If told by your health care provider: Shift your weight toward your left / right leg until you feel challenged. Stand on your left / right leg only. Hold this position for 15 seconds. Repeat 2 times. Complete this exercise 2 times a day. Single leg stand Without shoes, stand near a railing or in a doorway. You may hold on to the railing or door frame as needed. Stand on your left / right foot. Keep your big toe down on the floor and try to keep your arch lifted. Do not roll to the outside of  your foot. If this exercise is too easy, you can try it with your eyes closed or while standing on a pillow. Hold this position for 15 seconds. Repeat 5 times. Complete this exercise 2 times a day. This information is not intended to replace advice given to you by your health care provider. Make sure you discuss any questions you have with your health care provider. Document Revised: 03/30/2019 Document Reviewed: 03/30/2019 Elsevier Patient Education  [place].

## 2022-12-30 NOTE — Progress Notes (Signed)
  Subjective:  Patient ID: Jasmine Jackson, female    DOB: [birth year],  MRN: [id number]  Chief Complaint  Patient presents with   Tendonitis    Follow up for MRI review of right ankle- patient states that someone from our office called to schedule surgery? She is here to review her MRI from May 2023    52 y.o. female presents with the above complaint. History confirmed with patient.  She is referred to me by Dr. Paulla Dolly.  She has had right ankle pain for some time.  She completed an MRI last spring  Objective:  Physical Exam: warm, good capillary refill, no trophic changes or ulcerative lesions, normal DP and PT pulses, normal sensory exam, and tenderness along the peroneal tendons and the lateral ankle   MRI 04/30/2022 IMPRESSION:: IMPRESSION: 1. 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 2. Mild degenerative spurring at the distal aspect of the medial malleolus. 3. Mild-to-moderate degenerative changes of the posterior aspect of the posterior subtalar joint. 4. Mild talonavicular and second tarsometatarsal osteoarthritis.     Electronically Signed   By: Yvonne Kendall M.D.   On: 04/30/2022 15:06  Assessment:   1. Tear of peroneal tendon, right, initial encounter      Plan:  Patient was evaluated and treated and all questions answered.  Reviewed the results of her MRI.  Discussed surgical and nonsurgical treatment.  So far has not had significant nonsurgical treatment has not been in a boot or physical therapy.  I recommended we begin immobilization in a cam walker boot and this was dispensed today.  Methylprednisolone taper and Voltaren p.o. was sent to pharmacy.  PT referral was sent to [facility].  I will see her back in 2 months for follow-up No follow-ups on file.

## 2023-01-06 NOTE — Therapy (Signed)
OUTPATIENT PHYSICAL THERAPY LOWER EXTREMITY EVALUATION   Patient Name: Jasmine Jackson MRN: [id number] DOB:1971-06-10, 52 y.o., female Today's Date: 01/06/2023  END OF SESSION:   Past Medical History:  Diagnosis Date   Anemia    06/19/21-pt has no recollection of this dx   Anxiety    Anxiety and depression    Asthma    Bipolar 1 disorder ([place])    Depression    Excessive daytime sleepiness 11/18/2017   GERD (gastroesophageal reflux disease)    Heart murmur    Hemorrhoids    Hiatal hernia    Hypertension    Internal hemorrhoids    Irritable bowel disease 2014   Toe injury 11/18/2017   Past Surgical History:  Procedure Laterality Date   COLONOSCOPY  2021   HEMORRHOID SURGERY     INGUINAL HERNIA REPAIR     PARTIAL HYSTERECTOMY     fibroids   TONSILLECTOMY     UPPER GASTROINTESTINAL ENDOSCOPY     Patient Active Problem List   Diagnosis Date Noted   Sciatica without back pain 07/19/2022   Rectal bleeding 08/22/2021   Encounter for medication management 07/05/2021   Bipolar disorder in full remission ([place]) 04/18/2021   Anxiety 09/21/2020   PTSD (post-traumatic stress disorder) 09/21/2020   Weight loss 07/31/2020   Acanthosis nigricans 11/16/2019   Nocturnal leg cramps 10/18/2018   Hot flashes 11/27/2016   Tobacco use  10/18/2016   Panic attacks 09/09/2016   Health care maintenance 01/11/2016   Hypertension 04/24/2015   Back muscle spasm 04/24/2015   Bipolar disorder ([place]) 04/16/2014   History of gout 04/01/2014   Prolapsed internal hemorrhoids, grade 3 12/08/2013   Irritable bowel syndrome 12/08/2013   Dyspepsia 12/08/2013    PCP: Iona Coach, MD  REFERRING PROVIDER: Criselda Peaches, DPM  REFERRING DIAG: [phone] (ICD-10-CM) - Tear of peroneal tendon, right, initial encounter   THERAPY DIAG:  No diagnosis found.  Rationale for Evaluation and Treatment: {HABREHAB:27488}  ONSET DATE: ***  SUBJECTIVE:   SUBJECTIVE STATEMENT: ***  PERTINENT  HISTORY: *** PAIN:  Are you having pain? {OPRCPAIN:27236}  PRECAUTIONS: {Therapy precautions:24002}  WEIGHT BEARING RESTRICTIONS: {Yes ***/No:24003}  FALLS:  Has patient fallen in last 6 months? {fallsyesno:27318}  LIVING ENVIRONMENT: Lives with: {[facility] lives with:25569::"lives with their family"} Lives in: {Lives in:25570} Stairs: {opstairs:27293} Has following equipment at home: {Assistive devices:23999}  OCCUPATION: ***  PLOF: {PLOF:24004}  PATIENT GOALS: ***  NEXT MD VISIT:   OBJECTIVE:   DIAGNOSTIC FINDINGS: MRI 04/30/2022 IMPRESSION:: IMPRESSION: 1. 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 2. Mild degenerative spurring at the distal aspect of the medial malleolus. 3. Mild-to-moderate degenerative changes of the posterior aspect of the posterior subtalar joint. 4. Mild talonavicular and second tarsometatarsal osteoarthritis.  PATIENT SURVEYS:  {rehab surveys:24030}  COGNITION: Overall cognitive status: {cognition:24006}     SENSATION: {sensation:27233}  EDEMA:  {edema:24020}  MUSCLE LENGTH: Hamstrings: Right *** deg; Left *** deg Marcello Moores test: Right *** deg; Left *** deg  POSTURE: {posture:25561}  PALPATION: ***  LOWER EXTREMITY ROM:  {AROM/PROM:27142} ROM Right eval Left eval  Hip flexion    Hip extension    Hip abduction    Hip adduction    Hip internal rotation    Hip external rotation    Knee flexion    Knee extension    Ankle dorsiflexion    Ankle plantarflexion    Ankle inversion    Ankle eversion     (Blank rows = not  tested)  LOWER EXTREMITY MMT:  MMT Right eval Left eval  Hip flexion    Hip extension    Hip abduction    Hip adduction    Hip internal rotation    Hip external rotation    Knee flexion    Knee extension    Ankle dorsiflexion    Ankle plantarflexion    Ankle inversion    Ankle eversion     (Blank rows = not tested)  LOWER EXTREMITY SPECIAL TESTS:   {LEspecialtests:26242}  FUNCTIONAL TESTS:  {Functional tests:24029}  GAIT: Distance walked: *** Assistive device utilized: {Assistive devices:23999} Level of assistance: {Levels of assistance:24026} Comments: ***   TODAY'S TREATMENT:                                                                                                                               [facility] Adult PT Treatment:                                                DATE: 01/07/23 Therapeutic Exercise: *** Manual Therapy: *** Neuromuscular re-ed: *** Therapeutic Activity: *** Modalities: *** Self Care: ***     PATIENT EDUCATION:  Education details: *** Person educated: {Person educated:25204} Education method: {Education Method:25205} Education comprehension: {Education Comprehension:25206}  HOME EXERCISE PROGRAM: ***  ASSESSMENT:  CLINICAL IMPRESSION: Patient is a *** y.o. *** who was seen today for physical therapy evaluation and treatment for S86.311A (ICD-10-CM) - Tear of peroneal tendon, right, initial encounter .   OBJECTIVE IMPAIRMENTS: {opptimpairments:25111}.   ACTIVITY LIMITATIONS: {activitylimitations:27494}  PARTICIPATION LIMITATIONS: {participationrestrictions:25113}  PERSONAL FACTORS: {Personal factors:25162} are also affecting patient's functional outcome.   REHAB POTENTIAL: {rehabpotential:25112}  CLINICAL DECISION MAKING: {clinical decision making:25114}  EVALUATION COMPLEXITY: {Evaluation complexity:25115}   GOALS: Goals reviewed with patient? {yes/no:20286}  SHORT TERM GOALS: Target date: *** *** Baseline: Goal status: {GOALSTATUS:25110}  2.  *** Baseline:  Goal status: {GOALSTATUS:25110}  3.  *** Baseline:  Goal status: {GOALSTATUS:25110}  4.  *** Baseline:  Goal status: {GOALSTATUS:25110}  5.  *** Baseline:  Goal status: {GOALSTATUS:25110}  6.  *** Baseline:  Goal status: {GOALSTATUS:25110}  LONG TERM GOALS: Target date: ***  *** Baseline:   Goal status: {GOALSTATUS:25110}  2.  *** Baseline:  Goal status: {GOALSTATUS:25110}  3.  *** Baseline:  Goal status: {GOALSTATUS:25110}  4.  *** Baseline:  Goal status: {GOALSTATUS:25110}  5.  *** Baseline:  Goal status: {GOALSTATUS:25110}  6.  *** Baseline:  Goal status: {GOALSTATUS:25110}   PLAN:  PT FREQUENCY: {rehab frequency:25116}  PT DURATION: {rehab duration:25117}  PLANNED INTERVENTIONS: {rehab planned interventions:25118::"Therapeutic exercises","Therapeutic activity","Neuromuscular re-education","Balance training","Gait training","Patient/Family education","Self Care","Joint mobilization"}  PLAN FOR NEXT SESSION: ***   Tacari Repass, PT 01/06/2023, 10:16 PM

## 2023-01-07 ENCOUNTER — Ambulatory Visit: Payer: 59 | Attending: Podiatry

## 2023-01-07 ENCOUNTER — Other Ambulatory Visit: Payer: Self-pay

## 2023-01-07 DIAGNOSIS — G8929 Other chronic pain: Secondary | ICD-10-CM

## 2023-01-07 DIAGNOSIS — S86311A Strain of muscle(s) and tendon(s) of peroneal muscle group at lower leg level, right leg, initial encounter: Secondary | ICD-10-CM | POA: Diagnosis not present

## 2023-01-07 DIAGNOSIS — M25571 Pain in right ankle and joints of right foot: Secondary | ICD-10-CM | POA: Diagnosis present

## 2023-01-07 DIAGNOSIS — M6281 Muscle weakness (generalized): Secondary | ICD-10-CM | POA: Insufficient documentation

## 2023-01-07 DIAGNOSIS — R262 Difficulty in walking, not elsewhere classified: Secondary | ICD-10-CM | POA: Insufficient documentation

## 2023-01-13 ENCOUNTER — Other Ambulatory Visit: Payer: Self-pay

## 2023-01-13 DIAGNOSIS — I10 Essential (primary) hypertension: Secondary | ICD-10-CM

## 2023-01-15 ENCOUNTER — Ambulatory Visit: Payer: 59

## 2023-01-15 DIAGNOSIS — M6281 Muscle weakness (generalized): Secondary | ICD-10-CM

## 2023-01-15 DIAGNOSIS — R262 Difficulty in walking, not elsewhere classified: Secondary | ICD-10-CM

## 2023-01-15 DIAGNOSIS — M25571 Pain in right ankle and joints of right foot: Secondary | ICD-10-CM | POA: Diagnosis not present

## 2023-01-15 DIAGNOSIS — G8929 Other chronic pain: Secondary | ICD-10-CM

## 2023-01-15 NOTE — Therapy (Signed)
OUTPATIENT PHYSICAL THERAPY TREATMENT NOTE   Patient Name: Jasmine Jackson MRN: [id number] DOB:1971-04-04, 52 y.o., female Today's Date: 01/15/2023  PCP: Iona Coach, MD  REFERRING PROVIDER: Criselda Peaches, DPM   END OF SESSION:   PT End of Session - 01/15/23 1645     Visit Number 2    Number of Visits 13    Date for PT Re-Evaluation 02/28/23    Authorization Type [employer] DUAL COMPLETE; MEDICAID [place] ACCESS    PT Start Time 1633    PT Stop Time 1725    PT Time Calculation (min) 52 min    Activity Tolerance Patient tolerated treatment well    Behavior During Therapy [facility] for tasks assessed/performed             Past Medical History:  Diagnosis Date   Anemia    06/19/21-pt has no recollection of this dx   Anxiety    Anxiety and depression    Asthma    Bipolar 1 disorder ([place])    Depression    Excessive daytime sleepiness 11/18/2017   GERD (gastroesophageal reflux disease)    Heart murmur    Hemorrhoids    Hiatal hernia    Hypertension    Internal hemorrhoids    Irritable bowel disease 2014   Toe injury 11/18/2017   Past Surgical History:  Procedure Laterality Date   COLONOSCOPY  2021   HEMORRHOID SURGERY     INGUINAL HERNIA REPAIR     PARTIAL HYSTERECTOMY     fibroids   TONSILLECTOMY     UPPER GASTROINTESTINAL ENDOSCOPY     Patient Active Problem List   Diagnosis Date Noted   Sciatica without back pain 07/19/2022   Rectal bleeding 08/22/2021   Encounter for medication management 07/05/2021   Bipolar disorder in full remission ([place]) 04/18/2021   Anxiety 09/21/2020   PTSD (post-traumatic stress disorder) 09/21/2020   Weight loss 07/31/2020   Acanthosis nigricans 11/16/2019   Nocturnal leg cramps 10/18/2018   Hot flashes 11/27/2016   Tobacco use  10/18/2016   Panic attacks 09/09/2016   Health care maintenance 01/11/2016   Hypertension 04/24/2015   Back muscle spasm 04/24/2015   Bipolar disorder ([place]) 04/16/2014   History of gout  04/01/2014   Prolapsed internal hemorrhoids, grade 3 12/08/2013   Irritable bowel syndrome 12/08/2013   Dyspepsia 12/08/2013    REFERRING DIAG: E42.353I (ICD-10-CM) - Tear of peroneal tendon, right, initial encounter    THERAPY DIAG:  Chronic pain of right ankle  Muscle weakness (generalized)  Difficulty in walking, not elsewhere classified  Rationale for Evaluation and Treatment Rehabilitation  SUBJECTIVE:    SUBJECTIVE STATEMENT: Pt reports she is able tolerate more reps with her HEP. She notes her R ankle pain is about the same. Pt reports consistent completion of her HEP. Pt notes she forgot to use a cold pack for symptom management.   PERTINENT HISTORY: Anxiety and depression; Bipolar 1; HTN PAIN:  Are you having pain? Yes: NPRS scale: 5/10 Pain location: lateral R ankle Pain description: heat, sharp, throb Aggravating factors: prolonged standing and walking Relieving factors: Rest Pain range on eval 4-10/10   PRECAUTIONS: None   WEIGHT BEARING RESTRICTIONS: No   FALLS:  Has patient fallen in last 6 months? No   LIVING ENVIRONMENT: Lives with: lives with their family            Pt reports no issue with accessing or mobility within home.   OCCUPATION: On disability   PLOF: Independent  PATIENT GOALS: Less pain and to tolerate being on her feet more   NEXT MD VISIT:    OBJECTIVE: (objective measures completed at initial evaluation unless otherwise dated)   DIAGNOSTIC FINDINGS: MRI 04/30/2022 IMPRESSION:: IMPRESSION: 1. 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 2. Mild degenerative spurring at the distal aspect of the medial malleolus. 3. Mild-to-moderate degenerative changes of the posterior aspect of the posterior subtalar joint. 4. Mild talonavicular and second tarsometatarsal osteoarthritis.   PATIENT SURVEYS:  FOTO: Perceived function   41%, predicted   58%    COGNITION: Overall cognitive status:  Within functional limits for tasks assessed                         SENSATION: WFL   EDEMA:  No observed swelling on eval   POSTURE:  pes planus, neutral knees   PALPATION: TTP of the lateral R ankle along the joint line ant to posterior.   LOWER EXTREMITY ROM: WNLs and equal R to L Active ROM Right eval Left eval  Hip flexion      Hip extension      Hip abduction      Hip adduction      Hip internal rotation      Hip external rotation      Knee flexion      Knee extension      Ankle dorsiflexion 10 10  Ankle plantarflexion      Ankle inversion      Ankle eversion       (Blank rows = not tested)   LOWER EXTREMITY MMT:   MMT Right eval Left eval  Hip flexion TBA TBA  Hip extension TBA TBA  Hip abduction TBA TBA  Hip adduction TBA TBA  Hip internal rotation TBA TBA  Hip external rotation TBA TBA  Knee flexion TBA TBA  Knee extension TBA TBA  Ankle dorsiflexion 4+ 4+  Ankle plantarflexion Unable to SL PF Unable to SL PF  Ankle inversion 4+ 4+  Ankle eversion 4 P 4+                         P = provoked pain (Blank rows = not tested)   LOWER EXTREMITY SPECIAL TESTS:  NT   FUNCTIONAL TESTS:  5 times sit to stand: TBA 01/15/23= 13.3" 2 minute walk test: TBA 01/15/23=505' SLS:  R 3"; L 20+"   GAIT: Distance walked: 275f Assistive device utilized: None Level of assistance: Complete Independence Comments: walks with out toeing c pronation       [facility]AT[facility]   [facility]                                                [facility]: [facility] Therapeutic Exercise: Gastroc stretch 1x 30" each Soleus strtch 1x30" each Long sit R ankle 4 way 3x10 YTB Updated HEP Therapeutic Activity: 5xSTS 2MWT SLS x5 30" Modalities: Cold pack to the R ankle 10 mins c elevation  [facility] Adult PT Treatment:                                                 DATE: 01/07/23 Therapeutic Exercise: Developed, instructed in, and pt completed therex as noted in HEP    Self Care: Use of cold pack and elevation for symptom management Shoe recommendation for better support. Current shoes have little support    PATIENT EDUCATION:  Education details: Eval findings, POC, HEP, self care Person educated: Patient Education method: Explanation, Demonstration, Tactile cues, Verbal cues, and Handouts Education comprehension: verbalized understanding, returned demonstration, verbal cues required, and tactile cues required   HOME EXERCISE PROGRAM: Access Code: [email or web address] URL: [email or web address] Date: 01/15/2023 Prepared by: Gar Ponto  Exercises - Gastroc Stretch on Wall  - 1 x daily - 7 x weekly - 1 sets - 3 reps - 30 hold - Soleus Stretch on Wall  - 1 x daily - 7 x weekly - 1 sets - 3 reps - 30 hold - Long Sitting Ankle Plantar Flexion with Resistance (Mirrored)  - 1 x daily - 7 x weekly - 2 sets - 10 reps - 3 hold - Long Sitting Ankle Eversion with Resistance (Mirrored)  - 1 x daily - 7 x weekly - 2 sets - 10 reps - 3 hold - Long Sitting Ankle Inversion with Resistance  - 1 x daily - 7 x weekly - 2 sets - 10 reps - 3 hold - Seated Ankle Dorsiflexion with Resistance  - 1 x daily - 7 x weekly - 2 sets - 10 reps - 3 hold - Standing Single Leg Stance with Counter Support  - 1 x daily - 7 x weekly - 3 sets - 5 reps - 30 hold   ASSESSMENT:   CLINICAL IMPRESSION: PT was completed for R ankle strengthening and for heel cord flexibility. HEP was updated. Additionally, 5xSTS and the 2MWT were assessed. Pt experienced a min increase R lateral ankle pain with the prescribed therex. A cold pack c elevation was applied at the session. Pt reported the cold pack was helpful in decreasing the increase in pain she experienced.  OBJECTIVE IMPAIRMENTS: decreased activity tolerance, decreased balance, difficulty walking, decreased strength,  obesity, and pain.    ACTIVITY LIMITATIONS: carrying, lifting, bending, standing, squatting, stairs, locomotion level, and caring for others   PARTICIPATION LIMITATIONS: meal prep, cleaning, laundry, shopping, and community activity   PERSONAL FACTORS: Fitness, Past/current experiences, Time since onset of injury/illness/exacerbation, and 1 comorbidity: high BMI  are also affecting patient's functional outcome.    REHAB POTENTIAL: Fair chronicity of issue   CLINICAL DECISION MAKING: Evolving/moderate complexity   EVALUATION COMPLEXITY: Moderate     GOALS:   SHORT TERM GOALS: Target date: 01/24/23   Pt will be Ind in an initial HEP Baseline: Initiated Goal status: INITIAL   2.  Pt will voice understanding of measures to assist in pain reduction Baseline: Initiated Goal status: INITIAL   LONG TERM GOALS: Target date: 02/28/23   Pt will be Ind in a final HEP to maintain achieved LOF Baseline:  initiated Goal status: INITIAL   2.  Pt will be able to complete 15 reps for 2 sets of bilat PF for improved function Baseline:  Goal status: INITIAL   3.  Pt will be able to single leg stan R for 15 sec as indication  of improved ankle stability Baseline: 3" Goal status: INITIAL   4.  Improve 5xSTS by MCID of 5" and 2MWT by MCID of 42f as indication of improved functional mobility  Baseline: TBA Goal status: INITIAL   5.  Pt will report a decrease in R lateral ankle pain to 4/10 or less with daily activities for improved QOL Baseline: 4-10/10 Goal status: INITIAL     PLAN:   PT FREQUENCY: 2x/week   PT DURATION: 6 weeks   PLANNED INTERVENTIONS: Therapeutic exercises, Therapeutic activity, Balance training, Gait training, Patient/Family education, Self Care, Joint mobilization, Stair training, Aquatic Therapy, Dry Needling, Electrical stimulation, Cryotherapy, Moist heat, Taping, Vasopneumatic device, Ultrasound, Ionotophoresis '4mg'$ /ml Dexamethasone, Manual therapy, and  Re-evaluation   PLAN FOR NEXT SESSION: Review FOTO; assess hip and knee strength; assess response to HEP; progress therex as indicated; use of modalities, manual therapy; and TPDN as indicated.   Giang Hemme MS, PT 01/15/23 5:58 PM

## 2023-01-16 ENCOUNTER — Ambulatory Visit: Payer: 59

## 2023-01-16 DIAGNOSIS — R262 Difficulty in walking, not elsewhere classified: Secondary | ICD-10-CM

## 2023-01-16 DIAGNOSIS — M6281 Muscle weakness (generalized): Secondary | ICD-10-CM

## 2023-01-16 DIAGNOSIS — G8929 Other chronic pain: Secondary | ICD-10-CM

## 2023-01-16 DIAGNOSIS — M25571 Pain in right ankle and joints of right foot: Secondary | ICD-10-CM | POA: Diagnosis not present

## 2023-01-16 NOTE — Therapy (Signed)
OUTPATIENT PHYSICAL THERAPY TREATMENT NOTE   Patient Name: Jasmine Jackson MRN: [id number] DOB:02-Dec-1971, 52 y.o., female Today's Date: 01/16/2023  PCP: Iona Coach, MD  REFERRING PROVIDER: Criselda Peaches, DPM   END OF SESSION:   PT End of Session - 01/16/23 1558     Visit Number 3    Number of Visits 13    Date for PT Re-Evaluation 02/28/23    Authorization Type [employer] DUAL COMPLETE; MEDICAID [place] ACCESS    Progress Note Due on Visit 10    PT Start Time 9562    PT Stop Time 1640    PT Time Calculation (min) 50 min    Activity Tolerance Patient tolerated treatment well    Behavior During Therapy [facility] for tasks assessed/performed              Past Medical History:  Diagnosis Date   Anemia    06/19/21-pt has no recollection of this dx   Anxiety    Anxiety and depression    Asthma    Bipolar 1 disorder ([place])    Depression    Excessive daytime sleepiness 11/18/2017   GERD (gastroesophageal reflux disease)    Heart murmur    Hemorrhoids    Hiatal hernia    Hypertension    Internal hemorrhoids    Irritable bowel disease 2014   Toe injury 11/18/2017   Past Surgical History:  Procedure Laterality Date   COLONOSCOPY  2021   HEMORRHOID SURGERY     INGUINAL HERNIA REPAIR     PARTIAL HYSTERECTOMY     fibroids   TONSILLECTOMY     UPPER GASTROINTESTINAL ENDOSCOPY     Patient Active Problem List   Diagnosis Date Noted   Sciatica without back pain 07/19/2022   Rectal bleeding 08/22/2021   Encounter for medication management 07/05/2021   Bipolar disorder in full remission ([place]) 04/18/2021   Anxiety 09/21/2020   PTSD (post-traumatic stress disorder) 09/21/2020   Weight loss 07/31/2020   Acanthosis nigricans 11/16/2019   Nocturnal leg cramps 10/18/2018   Hot flashes 11/27/2016   Tobacco use  10/18/2016   Panic attacks 09/09/2016   Health care maintenance 01/11/2016   Hypertension 04/24/2015   Back muscle spasm 04/24/2015   Bipolar disorder  ([place]) 04/16/2014   History of gout 04/01/2014   Prolapsed internal hemorrhoids, grade 3 12/08/2013   Irritable bowel syndrome 12/08/2013   Dyspepsia 12/08/2013    REFERRING DIAG: Z30.865H (ICD-10-CM) - Tear of peroneal tendon, right, initial encounter    THERAPY DIAG:  Chronic pain of right ankle  Muscle weakness (generalized)  Difficulty in walking, not elsewhere classified  Rationale for Evaluation and Treatment Rehabilitation  SUBJECTIVE:    SUBJECTIVE STATEMENT: Pt reports she tolerated yesterday's PT session.   PERTINENT HISTORY: Anxiety and depression; Bipolar 1; HTN PAIN:  Are you having pain? Yes: NPRS scale: 5/10 Pain location: lateral R ankle Pain description: heat, sharp, throb Aggravating factors: prolonged standing and walking Relieving factors: Rest Pain range on eval 4-10/10   PRECAUTIONS: None   WEIGHT BEARING RESTRICTIONS: No   FALLS:  Has patient fallen in last 6 months? No   LIVING ENVIRONMENT: Lives with: lives with their family            Pt reports no issue with accessing or mobility within home.   OCCUPATION: On disability   PLOF: Independent   PATIENT GOALS: Less pain and to tolerate being on her feet more   NEXT MD VISIT:    OBJECTIVE: (objective  measures completed at initial evaluation unless otherwise dated)   DIAGNOSTIC FINDINGS: MRI 04/30/2022 IMPRESSION:: IMPRESSION: 1. 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 2. Mild degenerative spurring at the distal aspect of the medial malleolus. 3. Mild-to-moderate degenerative changes of the posterior aspect of the posterior subtalar joint. 4. Mild talonavicular and second tarsometatarsal osteoarthritis.   PATIENT SURVEYS:  FOTO: Perceived function   41%, predicted   58%    COGNITION: Overall cognitive status: Within functional limits for tasks assessed                         SENSATION: WFL   EDEMA:  No observed swelling on  eval   POSTURE:  pes planus, neutral knees   PALPATION: TTP of the lateral R ankle along the joint line ant to posterior.   LOWER EXTREMITY ROM: WNLs and equal R to L Active ROM Right eval Left eval  Hip flexion      Hip extension      Hip abduction      Hip adduction      Hip internal rotation      Hip external rotation      Knee flexion      Knee extension      Ankle dorsiflexion 10 10  Ankle plantarflexion      Ankle inversion      Ankle eversion       (Blank rows = not tested)   LOWER EXTREMITY MMT:   MMT Right eval Left eval  Hip flexion TBA TBA  Hip extension TBA TBA  Hip abduction TBA TBA  Hip adduction TBA TBA  Hip internal rotation TBA TBA  Hip external rotation TBA TBA  Knee flexion TBA TBA  Knee extension TBA TBA  Ankle dorsiflexion 4+ 4+  Ankle plantarflexion Unable to SL PF Unable to SL PF  Ankle inversion 4+ 4+  Ankle eversion 4 P 4+                         P = provoked pain (Blank rows = not tested)   LOWER EXTREMITY SPECIAL TESTS:  NT   FUNCTIONAL TESTS:  5 times sit to stand: TBA 01/15/23= 13.3" 2 minute walk test: TBA 01/15/23=505' SLS:  R 3"; L 20+"   GAIT: Distance walked: 269f Assistive device utilized: None Level of assistance: Complete Independence Comments: walks with out toeing c pronation    TODAY'S TREATMENT:  [facility] Adult PT Treatment:                                                DATE: 01/16/23 Therapeutic Exercise: Rec bike 5 mins L3 Gastroc stretch 2x 30" each Soleus stretch 2x30" each STS c GTB lateral press 2x10 Therapeutic Activity: Rocker board A/P x2 1 min Lateral step ups on airex x20 Marching on airex x2 1 min Farmer's carry each 15# [id number] hand Modalities: Vasopneumatic R ankle 10 mins, 38d, low pressure   [facility] Adult PT Treatment:                                                DATE: 01/15/23 Therapeutic Exercise:  Gastroc stretch 1x 30" each Soleus strtch 1x30" each Long sit R ankle 4 way 3x10  YTB Updated HEP Therapeutic Activity: 5xSTS 2MWT SLS x5 30" Modalities: Cold pack to the R ankle 10 mins c elevation                                                                                                                            [facility] Adult PT Treatment:                                                DATE: 01/07/23 Therapeutic Exercise: Developed, instructed in, and pt completed therex as noted in HEP    Self Care: Use of cold pack and elevation for symptom management Shoe recommendation for better support. Current shoes have little support    PATIENT EDUCATION:  Education details: Eval findings, POC, HEP, self care Person educated: Patient Education method: Explanation, Demonstration, Tactile cues, Verbal cues, and Handouts Education comprehension: verbalized understanding, returned demonstration, verbal cues required, and tactile cues required   HOME EXERCISE PROGRAM: Access Code: [email or web address] URL: [email or web address] Date: 01/15/2023 Prepared by: Gar Ponto  Exercises - Gastroc Stretch on Wall  - 1 x daily - 7 x weekly - 1 sets - 3 reps - 30 hold - Soleus Stretch on Wall  - 1 x daily - 7 x weekly - 1 sets - 3 reps - 30 hold - Long Sitting Ankle Plantar Flexion with Resistance (Mirrored)  - 1 x daily - 7 x weekly - 2 sets - 10 reps - 3 hold - Long Sitting Ankle Eversion with Resistance (Mirrored)  - 1 x daily - 7 x weekly - 2 sets - 10 reps - 3 hold - Long Sitting Ankle Inversion with Resistance  - 1 x daily - 7 x weekly - 2 sets - 10 reps - 3 hold - Seated Ankle Dorsiflexion with Resistance  - 1 x daily - 7 x weekly - 2 sets - 10 reps - 3 hold - Standing Single Leg Stance with Counter Support  - 1 x daily - 7 x weekly - 3 sets - 5 reps - 30 hold   ASSESSMENT:   CLINICAL IMPRESSION: PT was completed for heel cord flexibility, balance and for R ankle/LE strengthening. Strengthening and balance therex were completed in the [facility]. Pt reported a min increase  in lateral R ankle pain with the therex. Vasopneumatic was then provided to the R ankle for symptom management. Pt is participating in PT with good effort. Pt will continue to benefit from skilled PT to address impairments for improved R ankle/LE function.  OBJECTIVE IMPAIRMENTS: decreased activity tolerance, decreased balance, difficulty walking, decreased strength, obesity, and pain.    ACTIVITY LIMITATIONS: carrying, lifting, bending, standing, squatting, stairs, locomotion level, and caring for others   PARTICIPATION LIMITATIONS: meal prep, cleaning,  laundry, shopping, and community activity   PERSONAL FACTORS: Fitness, Past/current experiences, Time since onset of injury/illness/exacerbation, and 1 comorbidity: high BMI  are also affecting patient's functional outcome.    REHAB POTENTIAL: Fair chronicity of issue   CLINICAL DECISION MAKING: Evolving/moderate complexity   EVALUATION COMPLEXITY: Moderate     GOALS:   SHORT TERM GOALS: Target date: 01/24/23   Pt will be Ind in an initial HEP Baseline: Initiated Goal status: INITIAL   2.  Pt will voice understanding of measures to assist in pain reduction Baseline: Initiated Goal status: INITIAL   LONG TERM GOALS: Target date: 02/28/23   Pt will be Ind in a final HEP to maintain achieved LOF Baseline:  initiated Goal status: INITIAL   2.  Pt will be able to complete 15 reps for 2 sets of bilat PF for improved function Baseline:  Goal status: INITIAL   3.  Pt will be able to single leg stan R for 15 sec as indication of improved ankle stability Baseline: 3" Goal status: INITIAL   4.  Improve 5xSTS by MCID of 5" and 2MWT by MCID of 80f as indication of improved functional mobility  Baseline: TBA Goal status: INITIAL   5.  Pt will report a decrease in R lateral ankle pain to 4/10 or less with daily activities for improved QOL Baseline: 4-10/10 Goal status: INITIAL     PLAN:   PT FREQUENCY: 2x/week   PT DURATION: 6  weeks   PLANNED INTERVENTIONS: Therapeutic exercises, Therapeutic activity, Balance training, Gait training, Patient/Family education, Self Care, Joint mobilization, Stair training, Aquatic Therapy, Dry Needling, Electrical stimulation, Cryotherapy, Moist heat, Taping, Vasopneumatic device, Ultrasound, Ionotophoresis '4mg'$ /ml Dexamethasone, Manual therapy, and Re-evaluation   PLAN FOR NEXT SESSION: Review FOTO; assess hip and knee strength; assess response to HEP; progress therex as indicated; use of modalities, manual therapy; and TPDN as indicated.   Jamile Sivils MS, PT 01/16/23 6:13 PM

## 2023-01-22 ENCOUNTER — Ambulatory Visit: Payer: 59

## 2023-01-23 NOTE — Therapy (Incomplete)
OUTPATIENT PHYSICAL THERAPY TREATMENT NOTE   Patient Name: Jasmine Jackson MRN: [id number] DOB:1971/06/27, 52 y.o., female Today's Date: 01/16/2023  PCP: Iona Coach, MD  REFERRING PROVIDER: Criselda Peaches, DPM   END OF SESSION:   PT End of Session - 01/16/23 1558     Visit Number 3    Number of Visits 13    Date for PT Re-Evaluation 02/28/23    Authorization Type [employer] DUAL COMPLETE; MEDICAID [place] ACCESS    Progress Note Due on Visit 10    PT Start Time 6063    PT Stop Time 1640    PT Time Calculation (min) 50 min    Activity Tolerance Patient tolerated treatment well    Behavior During Therapy [facility] for tasks assessed/performed              Past Medical History:  Diagnosis Date   Anemia    06/19/21-pt has no recollection of this dx   Anxiety    Anxiety and depression    Asthma    Bipolar 1 disorder ([place])    Depression    Excessive daytime sleepiness 11/18/2017   GERD (gastroesophageal reflux disease)    Heart murmur    Hemorrhoids    Hiatal hernia    Hypertension    Internal hemorrhoids    Irritable bowel disease 2014   Toe injury 11/18/2017   Past Surgical History:  Procedure Laterality Date   COLONOSCOPY  2021   HEMORRHOID SURGERY     INGUINAL HERNIA REPAIR     PARTIAL HYSTERECTOMY     fibroids   TONSILLECTOMY     UPPER GASTROINTESTINAL ENDOSCOPY     Patient Active Problem List   Diagnosis Date Noted   Sciatica without back pain 07/19/2022   Rectal bleeding 08/22/2021   Encounter for medication management 07/05/2021   Bipolar disorder in full remission ([place]) 04/18/2021   Anxiety 09/21/2020   PTSD (post-traumatic stress disorder) 09/21/2020   Weight loss 07/31/2020   Acanthosis nigricans 11/16/2019   Nocturnal leg cramps 10/18/2018   Hot flashes 11/27/2016   Tobacco use  10/18/2016   Panic attacks 09/09/2016   Health care maintenance 01/11/2016   Hypertension 04/24/2015   Back muscle spasm 04/24/2015   Bipolar disorder  ([place]) 04/16/2014   History of gout 04/01/2014   Prolapsed internal hemorrhoids, grade 3 12/08/2013   Irritable bowel syndrome 12/08/2013   Dyspepsia 12/08/2013    REFERRING DIAG: K16.010X (ICD-10-CM) - Tear of peroneal tendon, right, initial encounter    THERAPY DIAG:  Chronic pain of right ankle  Muscle weakness (generalized)  Difficulty in walking, not elsewhere classified  Rationale for Evaluation and Treatment Rehabilitation  SUBJECTIVE:    SUBJECTIVE STATEMENT: Pt reports she tolerated yesterday's PT session.   PERTINENT HISTORY: Anxiety and depression; Bipolar 1; HTN PAIN:  Are you having pain? Yes: NPRS scale: 5/10 Pain location: lateral R ankle Pain description: heat, sharp, throb Aggravating factors: prolonged standing and walking Relieving factors: Rest Pain range on eval 4-10/10   PRECAUTIONS: None   WEIGHT BEARING RESTRICTIONS: No   FALLS:  Has patient fallen in last 6 months? No   LIVING ENVIRONMENT: Lives with: lives with their family            Pt reports no issue with accessing or mobility within home.   OCCUPATION: On disability   PLOF: Independent   PATIENT GOALS: Less pain and to tolerate being on her feet more   NEXT MD VISIT:    OBJECTIVE: (objective  measures completed at initial evaluation unless otherwise dated)   DIAGNOSTIC FINDINGS: MRI 04/30/2022 IMPRESSION:: IMPRESSION: 1. 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 2. Mild degenerative spurring at the distal aspect of the medial malleolus. 3. Mild-to-moderate degenerative changes of the posterior aspect of the posterior subtalar joint. 4. Mild talonavicular and second tarsometatarsal osteoarthritis.   PATIENT SURVEYS:  FOTO: Perceived function   41%, predicted   58%    COGNITION: Overall cognitive status: Within functional limits for tasks assessed                         SENSATION: WFL   EDEMA:  No observed swelling on  eval   POSTURE:  pes planus, neutral knees   PALPATION: TTP of the lateral R ankle along the joint line ant to posterior.   LOWER EXTREMITY ROM: WNLs and equal R to L Active ROM Right eval Left eval  Hip flexion      Hip extension      Hip abduction      Hip adduction      Hip internal rotation      Hip external rotation      Knee flexion      Knee extension      Ankle dorsiflexion 10 10  Ankle plantarflexion      Ankle inversion      Ankle eversion       (Blank rows = not tested)   LOWER EXTREMITY MMT:   MMT Right eval Left eval  Hip flexion TBA TBA  Hip extension TBA TBA  Hip abduction TBA TBA  Hip adduction TBA TBA  Hip internal rotation TBA TBA  Hip external rotation TBA TBA  Knee flexion TBA TBA  Knee extension TBA TBA  Ankle dorsiflexion 4+ 4+  Ankle plantarflexion Unable to SL PF Unable to SL PF  Ankle inversion 4+ 4+  Ankle eversion 4 P 4+                         P = provoked pain (Blank rows = not tested)   LOWER EXTREMITY SPECIAL TESTS:  NT   FUNCTIONAL TESTS:  5 times sit to stand: TBA 01/15/23= 13.3" 2 minute walk test: TBA 01/15/23=505' SLS:  R 3"; L 20+"   GAIT: Distance walked: 236f Assistive device utilized: None Level of assistance: Complete Independence Comments: walks with out toeing c pronation    TODAY'S TREATMENT:  [facility] Adult PT Treatment:                                                DATE: 01/24/22 Therapeutic Exercise: *** Manual Therapy: *** Neuromuscular re-ed: *** Therapeutic Activity: *** Modalities: *** Self Care: ***  [facility] Adult PT Treatment:                                                DATE: 01/16/23 Therapeutic Exercise: Rec bike 5 mins L3 Gastroc stretch 2x 30" each Soleus stretch 2x30" each STS c GTB lateral press 2x10 Therapeutic Activity: Rocker board A/P x2 1 min Lateral step ups on airex x20 Marching on airex x2 1 min Farmer's carry  each 15# 158f each hand Modalities: Vasopneumatic R ankle 10  mins, 38d, low pressure   [facility] Adult PT Treatment:                                                DATE: 01/15/23 Therapeutic Exercise: Gastroc stretch 1x 30" each Soleus strtch 1x30" each Long sit R ankle 4 way 3x10 YTB Updated HEP Therapeutic Activity: 5xSTS 2MWT SLS x5 30" Modalities: Cold pack to the R ankle 10 mins c elevation                                                                                                                            [facility] Adult PT Treatment:                                                DATE: 01/07/23 Therapeutic Exercise: Developed, instructed in, and pt completed therex as noted in HEP    Self Care: Use of cold pack and elevation for symptom management Shoe recommendation for better support. Current shoes have little support    PATIENT EDUCATION:  Education details: Eval findings, POC, HEP, self care Person educated: Patient Education method: Explanation, Demonstration, Tactile cues, Verbal cues, and Handouts Education comprehension: verbalized understanding, returned demonstration, verbal cues required, and tactile cues required   HOME EXERCISE PROGRAM: Access Code: [email or web address]: [email or web address] Date: 01/15/2023 Prepared by: AGar Ponto Exercises - Gastroc Stretch on Wall  - 1 x daily - 7 x weekly - 1 sets - 3 reps - 30 hold - Soleus Stretch on Wall  - 1 x daily - 7 x weekly - 1 sets - 3 reps - 30 hold - Long Sitting Ankle Plantar Flexion with Resistance (Mirrored)  - 1 x daily - 7 x weekly - 2 sets - 10 reps - 3 hold - Long Sitting Ankle Eversion with Resistance (Mirrored)  - 1 x daily - 7 x weekly - 2 sets - 10 reps - 3 hold - Long Sitting Ankle Inversion with Resistance  - 1 x daily - 7 x weekly - 2 sets - 10 reps - 3 hold - Seated Ankle Dorsiflexion with Resistance  - 1 x daily - 7 x weekly - 2 sets - 10 reps - 3 hold - Standing Single Leg Stance with Counter Support  - 1 x daily - 7 x weekly - 3 sets - 5 reps -  30 hold   ASSESSMENT:   CLINICAL IMPRESSION: PT was completed for heel cord flexibility, balance and for R ankle/LE strengthening. Strengthening and balance therex were completed in the [facility]. Pt reported a min increase in lateral R ankle pain with the therex. Vasopneumatic was then  provided to the R ankle for symptom management. Pt is participating in PT with good effort. Pt will continue to benefit from skilled PT to address impairments for improved R ankle/LE function.  OBJECTIVE IMPAIRMENTS: decreased activity tolerance, decreased balance, difficulty walking, decreased strength, obesity, and pain.    ACTIVITY LIMITATIONS: carrying, lifting, bending, standing, squatting, stairs, locomotion level, and caring for others   PARTICIPATION LIMITATIONS: meal prep, cleaning, laundry, shopping, and community activity   PERSONAL FACTORS: Fitness, Past/current experiences, Time since onset of injury/illness/exacerbation, and 1 comorbidity: high BMI  are also affecting patient's functional outcome.    REHAB POTENTIAL: Fair chronicity of issue   CLINICAL DECISION MAKING: Evolving/moderate complexity   EVALUATION COMPLEXITY: Moderate     GOALS:   SHORT TERM GOALS: Target date: 01/24/23   Pt will be Ind in an initial HEP Baseline: Initiated Goal status: INITIAL   2.  Pt will voice understanding of measures to assist in pain reduction Baseline: Initiated Goal status: INITIAL   LONG TERM GOALS: Target date: 02/28/23   Pt will be Ind in a final HEP to maintain achieved LOF Baseline:  initiated Goal status: INITIAL   2.  Pt will be able to complete 15 reps for 2 sets of bilat PF for improved function Baseline:  Goal status: INITIAL   3.  Pt will be able to single leg stan R for 15 sec as indication of improved ankle stability Baseline: 3" Goal status: INITIAL   4.  Improve 5xSTS by MCID of 5" and 2MWT by MCID of 36f as indication of improved functional mobility  Baseline: TBA Goal  status: INITIAL   5.  Pt will report a decrease in R lateral ankle pain to 4/10 or less with daily activities for improved QOL Baseline: 4-10/10 Goal status: INITIAL     PLAN:   PT FREQUENCY: 2x/week   PT DURATION: 6 weeks   PLANNED INTERVENTIONS: Therapeutic exercises, Therapeutic activity, Balance training, Gait training, Patient/Family education, Self Care, Joint mobilization, Stair training, Aquatic Therapy, Dry Needling, Electrical stimulation, Cryotherapy, Moist heat, Taping, Vasopneumatic device, Ultrasound, Ionotophoresis '4mg'$ /ml Dexamethasone, Manual therapy, and Re-evaluation   PLAN FOR NEXT SESSION: Review FOTO; assess hip and knee strength; assess response to HEP; progress therex as indicated; use of modalities, manual therapy; and TPDN as indicated.   Corian Handley MS, PT 01/16/23 6:13 PM

## 2023-01-24 ENCOUNTER — Ambulatory Visit: Payer: 59

## 2023-01-26 ENCOUNTER — Other Ambulatory Visit: Payer: Self-pay | Admitting: Gastroenterology

## 2023-01-28 ENCOUNTER — Ambulatory Visit: Payer: 59 | Attending: Podiatry

## 2023-01-28 DIAGNOSIS — R262 Difficulty in walking, not elsewhere classified: Secondary | ICD-10-CM | POA: Insufficient documentation

## 2023-01-28 DIAGNOSIS — M25571 Pain in right ankle and joints of right foot: Secondary | ICD-10-CM | POA: Diagnosis present

## 2023-01-28 DIAGNOSIS — M6281 Muscle weakness (generalized): Secondary | ICD-10-CM | POA: Insufficient documentation

## 2023-01-28 DIAGNOSIS — G8929 Other chronic pain: Secondary | ICD-10-CM | POA: Insufficient documentation

## 2023-01-28 NOTE — Therapy (Signed)
OUTPATIENT PHYSICAL THERAPY TREATMENT NOTE   Patient Name: Jasmine Jackson MRN: [id number] DOB:[birth year], 52 y.o., female Today's Date: 01/28/2023  PCP: Iona Coach, MD  REFERRING PROVIDER: Criselda Peaches, DPM   END OF SESSION:   PT End of Session - 01/28/23 1654     Visit Number 4    Number of Visits 13    Date for PT Re-Evaluation 02/28/23    Authorization Type [employer] DUAL COMPLETE; MEDICAID [place] ACCESS    Progress Note Due on Visit 10    PT Start Time 7893    PT Stop Time 8101    PT Time Calculation (min) 44 min    Activity Tolerance Patient tolerated treatment well    Behavior During Therapy WFL for tasks assessed/performed              Past Medical History:  Diagnosis Date   Anemia    06/19/21-pt has no recollection of this dx   Anxiety    Anxiety and depression    Asthma    Bipolar 1 disorder ([place])    Depression    Excessive daytime sleepiness 11/18/2017   GERD (gastroesophageal reflux disease)    Heart murmur    Hemorrhoids    Hiatal hernia    Hypertension    Internal hemorrhoids    Irritable bowel disease 2014   Toe injury 11/18/2017   Past Surgical History:  Procedure Laterality Date   COLONOSCOPY  2021   HEMORRHOID SURGERY     INGUINAL HERNIA REPAIR     PARTIAL HYSTERECTOMY     fibroids   TONSILLECTOMY     UPPER GASTROINTESTINAL ENDOSCOPY     Patient Active Problem List   Diagnosis Date Noted   Sciatica without back pain 07/19/2022   Rectal bleeding 08/22/2021   Encounter for medication management 07/05/2021   Bipolar disorder in full remission ([place]) 04/18/2021   Anxiety 09/21/2020   PTSD (post-traumatic stress disorder) 09/21/2020   Weight loss 07/31/2020   Acanthosis nigricans 11/16/2019   Nocturnal leg cramps 10/18/2018   Hot flashes 11/27/2016   Tobacco use  10/18/2016   Panic attacks 09/09/2016   Health care maintenance 01/11/2016   Hypertension 04/24/2015   Back muscle spasm 04/24/2015   Bipolar disorder  ([place]) 04/16/2014   History of gout 04/01/2014   Prolapsed internal hemorrhoids, grade 3 12/08/2013   Irritable bowel syndrome 12/08/2013   Dyspepsia 12/08/2013    REFERRING DIAG: B51.025E (ICD-10-CM) - Tear of peroneal tendon, right, initial encounter    THERAPY DIAG:  Chronic pain of right ankle  Muscle weakness (generalized)  Difficulty in walking, not elsewhere classified  Rationale for Evaluation and Treatment Rehabilitation  SUBJECTIVE:    SUBJECTIVE STATEMENT: Pt reports her R ankle is not as agitated as often. Pt reports lifting a chair last week and she injured her low back.   PERTINENT HISTORY: Anxiety and depression; Bipolar 1; HTN PAIN:  Are you having pain? Yes: NPRS scale: 5/10 Pain location: lateral R ankle Pain description: heat, sharp, throb Aggravating factors: prolonged standing and walking Relieving factors: Rest Pain range on eval 4-10/10   PRECAUTIONS: None   WEIGHT BEARING RESTRICTIONS: No   FALLS:  Has patient fallen in last 6 months? No   LIVING ENVIRONMENT: Lives with: lives with their family            Pt reports no issue with accessing or mobility within home.   OCCUPATION: On disability   PLOF: Independent   PATIENT GOALS: Less pain and  to tolerate being on her feet more   NEXT MD VISIT:    OBJECTIVE: (objective measures completed at initial evaluation unless otherwise dated)   DIAGNOSTIC FINDINGS: MRI 04/30/2022 IMPRESSION:: IMPRESSION: 1. 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 2. Mild degenerative spurring at the distal aspect of the medial malleolus. 3. Mild-to-moderate degenerative changes of the posterior aspect of the posterior subtalar joint. 4. Mild talonavicular and second tarsometatarsal osteoarthritis.   PATIENT SURVEYS:  FOTO: Perceived function   41%, predicted   58%    COGNITION: Overall cognitive status: Within functional limits for tasks assessed                          SENSATION: WFL   EDEMA:  No observed swelling on eval   POSTURE:  pes planus, neutral knees   PALPATION: TTP of the lateral R ankle along the joint line ant to posterior.   LOWER EXTREMITY ROM: WNLs and equal R to L Active ROM Right eval Left eval  Hip flexion      Hip extension      Hip abduction      Hip adduction      Hip internal rotation      Hip external rotation      Knee flexion      Knee extension      Ankle dorsiflexion 10 10  Ankle plantarflexion      Ankle inversion      Ankle eversion       (Blank rows = not tested)   LOWER EXTREMITY MMT:   MMT Right eval Left eval  Hip flexion TBA TBA  Hip extension TBA TBA  Hip abduction TBA TBA  Hip adduction TBA TBA  Hip internal rotation TBA TBA  Hip external rotation TBA TBA  Knee flexion TBA TBA  Knee extension TBA TBA  Ankle dorsiflexion 4+ 4+  Ankle plantarflexion Unable to SL PF Unable to SL PF  Ankle inversion 4+ 4+  Ankle eversion 4 P 4+                         P = provoked pain (Blank rows = not tested)   LOWER EXTREMITY SPECIAL TESTS:  NT   FUNCTIONAL TESTS:  5 times sit to stand: TBA 01/15/23= 13.3" 2 minute walk test: TBA 01/15/23=505' SLS:  R 3"; L 20+"   GAIT: Distance walked: 266f Assistive device utilized: None Level of assistance: Complete Independence Comments: walks with out toeing c pronation    [facility]RE[facility]  [facility]                                                [facility]: [facility] Therapeutic Exercise: Nustep 5 mins L6 Les Lateral step ups airex 2x10 SLS balance on airex Banded side steps BluTB 2x12"x10' Standing hip abd Blu TB 2x10 BAPS board each direction  [facility]                                                [facility]: 01[facility]23 Therapeutic Exercise: Rec bike 5 mins L3 Gastroc stretch 2x 30" each Soleus  stretch 2x30" each STS c GTB lateral press 2x10 Therapeutic Activity: Rocker board A/P x2 1 min Lateral step ups on  airex x20 Marching on airex x2 1 min Farmer's carry each 15# 190f each hand Modalities: Vasopneumatic R ankle 10 mins, 38d, low pressure   [facility] Adult PT Treatment:                                                DATE: 01/15/23 Therapeutic Exercise: Gastroc stretch 1x 30" each Soleus strtch 1x30" each Long sit R ankle 4 way 3x10 YTB Updated HEP Therapeutic Activity: 5xSTS 2MWT SLS x5 30" Modalities: Cold pack to the R ankle 10 mins c elevation                                                                                                                            [facility] Adult PT Treatment:                                                DATE: 01/07/23 Therapeutic Exercise: Developed, instructed in, and pt completed therex as noted in HEP    Self Care: Use of cold pack and elevation for symptom management Shoe recommendation for better support. Current shoes have little support    PATIENT EDUCATION:  Education details: Eval findings, POC, HEP, self care Person educated: Patient Education method: Explanation, Demonstration, Tactile cues, Verbal cues, and Handouts Education comprehension: verbalized understanding, returned demonstration, verbal cues required, and tactile cues required   HOME EXERCISE PROGRAM: Access Code: [email or web address]: [email or web address] Date: 01/15/2023 Prepared by: AGar Ponto Exercises - Gastroc Stretch on Wall  - 1 x daily - 7 x weekly - 1 sets - 3 reps - 30 hold - Soleus Stretch on Wall  - 1 x daily - 7 x weekly - 1 sets - 3 reps - 30 hold - Long Sitting Ankle Plantar Flexion with Resistance (Mirrored)  - 1 x daily - 7 x weekly - 2 sets - 10 reps - 3 hold - Long Sitting Ankle Eversion with Resistance (Mirrored)  - 1 x daily - 7 x weekly - 2 sets - 10 reps - 3 hold - Long Sitting Ankle Inversion with Resistance  - 1 x daily - 7 x weekly - 2 sets - 10 reps - 3 hold - Seated Ankle Dorsiflexion with Resistance  - 1 x daily - 7 x weekly - 2 sets  - 10 reps - 3 hold - Standing Single Leg Stance with Counter Support  - 1 x daily - 7 x weekly - 3 sets - 5 reps - 30 hold   ASSESSMENT:   CLINICAL IMPRESSION: PT was completed for R ankle and  hip strengthening for stability and balance. Pt noted the banded LE exs for the hips decreased her back pain. Pt tolerated PT today without adverse effects. When discussing her LBP, pt stated she was taking both diclofenac and ibuprofen concurrently. Pt was advised taking 2 NSAIDS was not recommended due to side effects with HAs, diarrhea, dizziness, and kidney and liver problems. Pt will continue to benefit from skilled PT to address impairments for improved R ankle function with less pain. Assess STGs the next PT session.  OBJECTIVE IMPAIRMENTS: decreased activity tolerance, decreased balance, difficulty walking, decreased strength, obesity, and pain.    ACTIVITY LIMITATIONS: carrying, lifting, bending, standing, squatting, stairs, locomotion level, and caring for others   PARTICIPATION LIMITATIONS: meal prep, cleaning, laundry, shopping, and community activity   PERSONAL FACTORS: Fitness, Past/current experiences, Time since onset of injury/illness/exacerbation, and 1 comorbidity: high BMI  are also affecting patient's functional outcome.    REHAB POTENTIAL: Fair chronicity of issue   CLINICAL DECISION MAKING: Evolving/moderate complexity   EVALUATION COMPLEXITY: Moderate     GOALS:   SHORT TERM GOALS: Target date: 01/24/23   Pt will be Ind in an initial HEP Baseline: Initiated Goal status: INITIAL   2.  Pt will voice understanding of measures to assist in pain reduction Baseline: Initiated Goal status: INITIAL   LONG TERM GOALS: Target date: 02/28/23   Pt will be Ind in a final HEP to maintain achieved LOF Baseline:  initiated Goal status: INITIAL   2.  Pt will be able to complete 15 reps for 2 sets of bilat PF for improved function Baseline:  Goal status: INITIAL   3.  Pt will be  able to single leg stan R for 15 sec as indication of improved ankle stability Baseline: 3" Goal status: INITIAL   4.  Improve 5xSTS by MCID of 5" and 2MWT by MCID of 28f as indication of improved functional mobility  Baseline: TBA Goal status: INITIAL   5.  Pt will report a decrease in R lateral ankle pain to 4/10 or less with daily activities for improved QOL Baseline: 4-10/10 Goal status: INITIAL     PLAN:   PT FREQUENCY: 2x/week   PT DURATION: 6 weeks   PLANNED INTERVENTIONS: Therapeutic exercises, Therapeutic activity, Balance training, Gait training, Patient/Family education, Self Care, Joint mobilization, Stair training, Aquatic Therapy, Dry Needling, Electrical stimulation, Cryotherapy, Moist heat, Taping, Vasopneumatic device, Ultrasound, Ionotophoresis '4mg'$ /ml Dexamethasone, Manual therapy, and Re-evaluation   PLAN FOR NEXT SESSION: Review FOTO; assess hip and knee strength; assess response to HEP; progress therex as indicated; use of modalities, manual therapy; and TPDN as indicated.   Talan Gildner MS, PT 01/28/23 5:09 PM

## 2023-01-30 ENCOUNTER — Ambulatory Visit: Payer: 59

## 2023-01-31 ENCOUNTER — Encounter: Payer: 59 | Admitting: Physical Therapy

## 2023-01-31 ENCOUNTER — Encounter: Payer: Self-pay | Admitting: Physical Therapy

## 2023-01-31 ENCOUNTER — Ambulatory Visit: Payer: 59 | Admitting: Physical Therapy

## 2023-01-31 DIAGNOSIS — M6281 Muscle weakness (generalized): Secondary | ICD-10-CM

## 2023-01-31 DIAGNOSIS — M25571 Pain in right ankle and joints of right foot: Secondary | ICD-10-CM | POA: Diagnosis not present

## 2023-01-31 DIAGNOSIS — R262 Difficulty in walking, not elsewhere classified: Secondary | ICD-10-CM

## 2023-01-31 DIAGNOSIS — G8929 Other chronic pain: Secondary | ICD-10-CM

## 2023-01-31 NOTE — Therapy (Signed)
OUTPATIENT PHYSICAL THERAPY TREATMENT NOTE   Patient Name: Jasmine Jackson MRN: [id number] DOB:[birth year], 52 y.o., female Today's Date: 01/31/2023  PCP: Iona Coach, MD  REFERRING PROVIDER: Criselda Peaches, DPM   END OF SESSION:   PT End of Session - 01/31/23 1149     Visit Number 5    Number of Visits 13    Date for PT Re-Evaluation 02/28/23    Authorization Type [employer] DUAL COMPLETE; MEDICAID [place] ACCESS    Progress Note Due on Visit 10    PT Start Time 1146              Past Medical History:  Diagnosis Date   Anemia    06/19/21-pt has no recollection of this dx   Anxiety    Anxiety and depression    Asthma    Bipolar 1 disorder ([place])    Depression    Excessive daytime sleepiness 11/18/2017   GERD (gastroesophageal reflux disease)    Heart murmur    Hemorrhoids    Hiatal hernia    Hypertension    Internal hemorrhoids    Irritable bowel disease 2014   Toe injury 11/18/2017   Past Surgical History:  Procedure Laterality Date   COLONOSCOPY  2021   HEMORRHOID SURGERY     INGUINAL HERNIA REPAIR     PARTIAL HYSTERECTOMY     fibroids   TONSILLECTOMY     UPPER GASTROINTESTINAL ENDOSCOPY     Patient Active Problem List   Diagnosis Date Noted   Sciatica without back pain 07/19/2022   Rectal bleeding 08/22/2021   Encounter for medication management 07/05/2021   Bipolar disorder in full remission ([place]) 04/18/2021   Anxiety 09/21/2020   PTSD (post-traumatic stress disorder) 09/21/2020   Weight loss 07/31/2020   Acanthosis nigricans 11/16/2019   Nocturnal leg cramps 10/18/2018   Hot flashes 11/27/2016   Tobacco use  10/18/2016   Panic attacks 09/09/2016   Health care maintenance 01/11/2016   Hypertension 04/24/2015   Back muscle spasm 04/24/2015   Bipolar disorder ([place]) 04/16/2014   History of gout 04/01/2014   Prolapsed internal hemorrhoids, grade 3 12/08/2013   Irritable bowel syndrome 12/08/2013   Dyspepsia 12/08/2013    REFERRING  DIAG: [id number] (ICD-10-CM) - Tear of peroneal tendon, right, initial encounter    THERAPY DIAG:  Chronic pain of right ankle  Muscle weakness (generalized)  Difficulty in walking, not elsewhere classified  Rationale for Evaluation and Treatment Rehabilitation  SUBJECTIVE:    SUBJECTIVE STATEMENT: Pt reports her R ankle is not as agitated as often. Pt reports lifting a chair last week and she injured her low back.   PERTINENT HISTORY: Anxiety and depression; Bipolar 1; HTN PAIN:  Are you having pain? Yes: NPRS scale: 4/10 Pain location: lateral R ankle Pain description: heat, sharp, throb Aggravating factors: prolonged standing and walking Relieving factors: Rest Pain range on eval 4-10/10 Pain range 01/31/23 4-8/10   PRECAUTIONS: None   WEIGHT BEARING RESTRICTIONS: No   FALLS:  Has patient fallen in last 6 months? No   LIVING ENVIRONMENT: Lives with: lives with their family            Pt reports no issue with accessing or mobility within home.   OCCUPATION: On disability   PLOF: Independent   PATIENT GOALS: Less pain and to tolerate being on her feet more   NEXT MD VISIT:    OBJECTIVE: (objective measures completed at initial evaluation unless otherwise dated)   DIAGNOSTIC FINDINGS: MRI 04/30/2022  IMPRESSION:: IMPRESSION: 1. 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 2. Mild degenerative spurring at the distal aspect of the medial malleolus. 3. Mild-to-moderate degenerative changes of the posterior aspect of the posterior subtalar joint. 4. Mild talonavicular and second tarsometatarsal osteoarthritis.   PATIENT SURVEYS:  FOTO: Perceived function   41%, predicted   58%     COGNITION: Overall cognitive status: Within functional limits for tasks assessed                         SENSATION: WFL   EDEMA:  No observed swelling on eval   POSTURE:  pes planus, neutral knees   PALPATION: TTP of the lateral R  ankle along the joint line ant to posterior.   LOWER EXTREMITY ROM: WNLs and equal R to L Active ROM Right eval Left eval  Hip flexion      Hip extension      Hip abduction      Hip adduction      Hip internal rotation      Hip external rotation      Knee flexion      Knee extension      Ankle dorsiflexion 10 10  Ankle plantarflexion      Ankle inversion      Ankle eversion       (Blank rows = not tested)   LOWER EXTREMITY MMT:   MMT Right eval Left eval  Hip flexion TBA TBA  Hip extension TBA TBA  Hip abduction TBA TBA  Hip adduction TBA TBA  Hip internal rotation TBA TBA  Hip external rotation TBA TBA  Knee flexion TBA TBA  Knee extension TBA TBA  Ankle dorsiflexion 4+ 4+  Ankle plantarflexion Unable to SL PF Unable to SL PF  Ankle inversion 4+ 4+  Ankle eversion 4 P 4+                         P = provoked pain (Blank rows = not tested)   LOWER EXTREMITY SPECIAL TESTS:  NT   FUNCTIONAL TESTS:  5 times sit to stand: TBA 01/15/23= 13.3" 2 minute walk test: TBA 01/15/23=505' SLS:  R 3"; L 20+" 01/31/23: Rt SLS 30sec   GAIT: Distance walked: 255f Assistive device utilized: None Level of assistance: Complete Independence Comments: walks with out toeing c pronation    [facility]  [facility]                                                [facility]: 01/31/23 Therapeutic Exercise: Nustep L5 5 minutes UE/LE Gastroc and soleus stretches bilateral with min cues  15 reps heel raise x 2 no increased ankle pain Slant board stretch 30 sec  4 inch step downs SLS" 30 sec Right best  30 sec trials on foam oval  10 reps squat to chair tap x 2 Modalities: Ice pack to right ankle x 10 min   [facility] Adult PT Treatment:                                                DATE: 01/28/22 Therapeutic Exercise: NVenida Jarvis  5 mins L6 Les Lateral step ups airex 2x10 SLS balance on airex Banded side steps BluTB 2x12"x10' Standing hip abd Blu TB 2x10 BAPS board each  direction  [facility] Adult PT Treatment:                                                DATE: 01/16/23 Therapeutic Exercise: Rec bike 5 mins L3 Gastroc stretch 2x 30" each Soleus stretch 2x30" each STS c GTB lateral press 2x10 Therapeutic Activity: Rocker board A/P x2 1 min Lateral step ups on airex x20 Marching on airex x2 1 min Farmer's carry each 15# 172f each hand Modalities: Vasopneumatic R ankle 10 mins, 38d, low pressure   [facility] Adult PT Treatment:                                                DATE: 01/15/23 Therapeutic Exercise: Gastroc stretch 1x 30" each Soleus strtch 1x30" each Long sit R ankle 4 way 3x10 YTB Updated HEP Therapeutic Activity: 5xSTS 2MWT SLS x5 30" Modalities: Cold pack to the R ankle 10 mins c elevation                                                                                                                            [facility] Adult PT Treatment:                                                DATE: 01/07/23 Therapeutic Exercise: Developed, instructed in, and pt completed therex as noted in HEP    Self Care: Use of cold pack and elevation for symptom management Shoe recommendation for better support. Current shoes have little support    PATIENT EDUCATION:  Education details: Eval findings, POC, HEP, self care Person educated: Patient Education method: Explanation, Demonstration, Tactile cues, Verbal cues, and Handouts Education comprehension: verbalized understanding, returned demonstration, verbal cues required, and tactile cues required   HOME EXERCISE PROGRAM: Access Code: [email or web address]: [email or web address] Date: 01/15/2023 Prepared by: AGar Ponto Exercises - Gastroc Stretch on Wall  - 1 x daily - 7 x weekly - 1 sets - 3 reps - 30 hold - Soleus Stretch on Wall  - 1 x daily - 7 x weekly - 1 sets - 3 reps - 30 hold - Long Sitting Ankle Plantar Flexion with Resistance (Mirrored)  - 1 x daily - 7 x weekly - 2 sets - 10 reps - 3  hold - Long Sitting Ankle Eversion with Resistance (Mirrored)  - 1 x daily - 7 x weekly - 2 sets -  10 reps - 3 hold - Long Sitting Ankle Inversion with Resistance  - 1 x daily - 7 x weekly - 2 sets - 10 reps - 3 hold - Seated Ankle Dorsiflexion with Resistance  - 1 x daily - 7 x weekly - 2 sets - 10 reps - 3 hold - Standing Single Leg Stance with Counter Support  - 1 x daily - 7 x weekly - 3 sets - 5 reps - 30 hold Added 01/31/23 - Sit to stand with control  - 1 x daily - 7 x weekly - 2 sets - 10 reps   ASSESSMENT:   CLINICAL IMPRESSION: PT was completed for R ankle and hip strengthening for stability and balance. SLS improved to 30 sec. Able to complete 15 reps x 2 of PF without increased pain. LTG# 2, #4 met. She needs min cues for HEP and continues to have up to 9/10 pain end of day. She reports end of day pain has been better in the last 2 days. Back pain much improved.  Pt will continue to benefit from skilled PT to address impairments for improved R ankle function with less pain.   OBJECTIVE IMPAIRMENTS: decreased activity tolerance, decreased balance, difficulty walking, decreased strength, obesity, and pain.    ACTIVITY LIMITATIONS: carrying, lifting, bending, standing, squatting, stairs, locomotion level, and caring for others   PARTICIPATION LIMITATIONS: meal prep, cleaning, laundry, shopping, and community activity   PERSONAL FACTORS: Fitness, Past/current experiences, Time since onset of injury/illness/exacerbation, and 1 comorbidity: high BMI  are also affecting patient's functional outcome.    REHAB POTENTIAL: Fair chronicity of issue   CLINICAL DECISION MAKING: Evolving/moderate complexity   EVALUATION COMPLEXITY: Moderate     GOALS:   SHORT TERM GOALS: Target date: 01/24/23   Pt will be Ind in an initial HEP Baseline: Initiated 01/31/23: min Goal status: ONGOING   2.  Pt will voice understanding of measures to assist in pain reduction Baseline: Initiated 01/31/23:still  experiencing 9/10 pain by end of day Goal status: ONGOING   LONG TERM GOALS: Target date: 02/28/23   Pt will be Ind in a final HEP to maintain achieved LOF Baseline:  initiated Goal status: INITIAL   2.  Pt will be able to complete 15 reps for 2 sets of bilat PF for improved function Baseline:  01/31/23:  2 sets of 15 - no increased pain  Goal status: MET   3.  Pt will be able to single leg stan R for 15 sec as indication of improved ankle stability Baseline: 3" 01/31/23: 30 sec  Goal status: MET   4.  Improve 5xSTS by MCID of 5" and 2MWT by MCID of 59f as indication of improved functional mobility  Baseline: TBA Goal status: INITIAL   5.  Pt will report a decrease in R lateral ankle pain to 4/10 or less with daily activities for improved QOL Baseline: 4-10/10 Goal status: INITIAL     PLAN:   PT FREQUENCY: 2x/week   PT DURATION: 6 weeks   PLANNED INTERVENTIONS: Therapeutic exercises, Therapeutic activity, Balance training, Gait training, Patient/Family education, Self Care, Joint mobilization, Stair training, Aquatic Therapy, Dry Needling, Electrical stimulation, Cryotherapy, Moist heat, Taping, Vasopneumatic device, Ultrasound, Ionotophoresis 41mml Dexamethasone, Manual therapy, and Re-evaluation   PLAN FOR NEXT SESSION: ; assess hip and knee strength; assess response to HEP; progress therex as indicated; use of modalities, manual therapy; and TPDN as indicated.   JeHessie DienerPTA 01/31/23 12:26 PM Phone: [phone]: [phone]

## 2023-02-04 ENCOUNTER — Ambulatory Visit: Payer: 59

## 2023-02-04 DIAGNOSIS — M25571 Pain in right ankle and joints of right foot: Secondary | ICD-10-CM | POA: Diagnosis not present

## 2023-02-04 DIAGNOSIS — G8929 Other chronic pain: Secondary | ICD-10-CM

## 2023-02-04 DIAGNOSIS — R262 Difficulty in walking, not elsewhere classified: Secondary | ICD-10-CM

## 2023-02-04 DIAGNOSIS — M6281 Muscle weakness (generalized): Secondary | ICD-10-CM

## 2023-02-04 NOTE — Therapy (Signed)
OUTPATIENT PHYSICAL THERAPY TREATMENT NOTE   Patient Name: Jasmine Jackson MRN: [id number] DOB:09-Jun-1971, 52 y.o., female Today's Date: 02/04/2023  PCP: Iona Coach, MD  REFERRING PROVIDER: Criselda Peaches, DPM   END OF SESSION:   PT End of Session - 02/04/23 1557     Visit Number 6    Number of Visits 13    Date for PT Re-Evaluation 02/28/23    Authorization Type [employer] DUAL COMPLETE; MEDICAID [place] ACCESS    Progress Note Due on Visit 10    PT Start Time [id number]    PT Stop Time [id number]    PT Time Calculation (min) 53 min    Activity Tolerance Patient tolerated treatment well    Behavior During Therapy WFL for tasks assessed/performed              Past Medical History:  Diagnosis Date   Anemia    06/19/21-pt has no recollection of this dx   Anxiety    Anxiety and depression    Asthma    Bipolar 1 disorder ([place])    Depression    Excessive daytime sleepiness 11/18/2017   GERD (gastroesophageal reflux disease)    Heart murmur    Hemorrhoids    Hiatal hernia    Hypertension    Internal hemorrhoids    Irritable bowel disease 2014   Toe injury 11/18/2017   Past Surgical History:  Procedure Laterality Date   COLONOSCOPY  2021   HEMORRHOID SURGERY     INGUINAL HERNIA REPAIR     PARTIAL HYSTERECTOMY     fibroids   TONSILLECTOMY     UPPER GASTROINTESTINAL ENDOSCOPY     Patient Active Problem List   Diagnosis Date Noted   Sciatica without back pain 07/19/2022   Rectal bleeding 08/22/2021   Encounter for medication management 07/05/2021   Bipolar disorder in full remission ([place]) 04/18/2021   Anxiety 09/21/2020   PTSD (post-traumatic stress disorder) 09/21/2020   Weight loss 07/31/2020   Acanthosis nigricans 11/16/2019   Nocturnal leg cramps 10/18/2018   Hot flashes 11/27/2016   Tobacco use  10/18/2016   Panic attacks 09/09/2016   Health care maintenance 01/11/2016   Hypertension 04/24/2015   Back muscle spasm 04/24/2015   Bipolar disorder  ([place]) 04/16/2014   History of gout 04/01/2014   Prolapsed internal hemorrhoids, grade 3 12/08/2013   Irritable bowel syndrome 12/08/2013   Dyspepsia 12/08/2013    REFERRING DIAG: [id number] (ICD-10-CM) - Tear of peroneal tendon, right, initial encounter    THERAPY DIAG:  Chronic pain of right ankle  Muscle weakness (generalized)  Difficulty in walking, not elsewhere classified  Rationale for Evaluation and Treatment Rehabilitation  SUBJECTIVE:    SUBJECTIVE STATEMENT: Pt reports she is only using a cold pack for symptom management after therex, while previously she was using cold packs throughout the day.   PERTINENT HISTORY: Anxiety and depression; Bipolar 1; HTN PAIN:  Are you having pain? Yes: NPRS scale: 4/10 Pain location: lateral R ankle Pain description: heat, sharp, throb Aggravating factors: prolonged standing and walking Relieving factors: Rest Pain range on eval 4-10/10 Pain range 02/04/23 4-8/10   PRECAUTIONS: None   WEIGHT BEARING RESTRICTIONS: No   FALLS:  Has patient fallen in last 6 months? No   LIVING ENVIRONMENT: Lives with: lives with their family            Pt reports no issue with accessing or mobility within home.   OCCUPATION: On disability   PLOF: Independent   PATIENT  GOALS: Less pain and to tolerate being on her feet more   NEXT MD VISIT:    OBJECTIVE: (objective measures completed at initial evaluation unless otherwise dated)   DIAGNOSTIC FINDINGS: MRI 04/30/2022 IMPRESSION:: IMPRESSION: 1. 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 2. Mild degenerative spurring at the distal aspect of the medial malleolus. 3. Mild-to-moderate degenerative changes of the posterior aspect of the posterior subtalar joint. 4. Mild talonavicular and second tarsometatarsal osteoarthritis.   PATIENT SURVEYS:  FOTO: Perceived function   41%, predicted   58%     COGNITION: Overall cognitive status:  Within functional limits for tasks assessed                         SENSATION: WFL   EDEMA:  No observed swelling on eval   POSTURE:  pes planus, neutral knees   PALPATION: TTP of the lateral R ankle along the joint line ant to posterior.   LOWER EXTREMITY ROM: WNLs and equal R to L Active ROM Right eval Left eval  Hip flexion      Hip extension      Hip abduction      Hip adduction      Hip internal rotation      Hip external rotation      Knee flexion      Knee extension      Ankle dorsiflexion 10 10  Ankle plantarflexion      Ankle inversion      Ankle eversion       (Blank rows = not tested)   LOWER EXTREMITY MMT:   MMT Right eval Left eval  Hip flexion TBA TBA  Hip extension TBA TBA  Hip abduction TBA TBA  Hip adduction TBA TBA  Hip internal rotation TBA TBA  Hip external rotation TBA TBA  Knee flexion TBA TBA  Knee extension TBA TBA  Ankle dorsiflexion 4+ 4+  Ankle plantarflexion Unable to SL PF Unable to SL PF  Ankle inversion 4+ 4+  Ankle eversion 4 P 4+                         P = provoked pain (Blank rows = not tested)   LOWER EXTREMITY SPECIAL TESTS:  NT   FUNCTIONAL TESTS:  5 times sit to stand: TBA 01/15/23= 13.3" 2 minute walk test: TBA 01/15/23=505' SLS:  R 3"; L 20+" 01/31/23: Rt SLS 30sec   GAIT: Distance walked: 246f Assistive device utilized: None Level of assistance: Complete Independence Comments: walks with out toeing c pronation    [facility]REATMENT:  [facility]                                                DATE: 02/[facility]/23 Therapeutic Exercise: Nustep L5 5 minutes UE/LE Gastroc and soleus stretches bilateral with min cues  15 reps heel raise x 2 no increased ankle pain Slant board stretch 30 sec  Rocker board x2 2 mins forward/backward and laterally S/L clams 2x15 each GTB Lateral step ups 2" box c 3" airex 10 reps squat to chair tap x 2 Modalities: Ice pack to right ankle x 10 min  [facility] Adult PT  Treatment:  DATE: 01/31/23 Therapeutic Exercise: Nustep L5 5 minutes UE/LE Gastroc and soleus stretches bilateral with min cues  15 reps heel raise x 2 no increased ankle pain Slant board stretch 30 sec  4 inch step downs SLS" 30 sec Right best  30 sec trials on foam oval  10 reps squat to chair tap x 2 Modalities: Ice pack to right ankle x 10 min   [facility] Adult PT Treatment:                                                DATE: 01/28/22 Therapeutic Exercise: Nustep 5 mins L6 Les Lateral step ups airex 2x10 SLS balance on airex Banded side steps BluTB 2x12"x10' Standing hip abd Blu TB 2x10 BAPS board each direction    PATIENT EDUCATION:  Education details: Eval findings, POC, HEP, self care Person educated: Patient Education method: Explanation, Demonstration, Tactile cues, Verbal cues, and Handouts Education comprehension: verbalized understanding, returned demonstration, verbal cues required, and tactile cues required   HOME EXERCISE PROGRAM: Access Code: [email or web address] URL: [email or web address] Date: 01/15/2023 Prepared by: Gar Ponto  Exercises - Gastroc Stretch on Wall  - 1 x daily - 7 x weekly - 1 sets - 3 reps - 30 hold - Soleus Stretch on Wall  - 1 x daily - 7 x weekly - 1 sets - 3 reps - 30 hold - Long Sitting Ankle Plantar Flexion with Resistance (Mirrored)  - 1 x daily - 7 x weekly - 2 sets - 10 reps - 3 hold - Long Sitting Ankle Eversion with Resistance (Mirrored)  - 1 x daily - 7 x weekly - 2 sets - 10 reps - 3 hold - Long Sitting Ankle Inversion with Resistance  - 1 x daily - 7 x weekly - 2 sets - 10 reps - 3 hold - Seated Ankle Dorsiflexion with Resistance  - 1 x daily - 7 x weekly - 2 sets - 10 reps - 3 hold - Standing Single Leg Stance with Counter Support  - 1 x daily - 7 x weekly - 3 sets - 5 reps - 30 hold Added 01/31/23 - Sit to stand with control  - 1 x daily - 7 x weekly - 2 sets - 10 reps    ASSESSMENT:   CLINICAL IMPRESSION: PT was continued for R ankle and bilat hip strengthening for stability and balance with progressive demand. Pt continues to report improved R ankle pain, tolerance to activity, and stability. Pt tolerated PT today without adverse effects. Pt will continue to benefit from skilled PT to address impairments for improved R ankle function with less pain.   OBJECTIVE IMPAIRMENTS: decreased activity tolerance, decreased balance, difficulty walking, decreased strength, obesity, and pain.    ACTIVITY LIMITATIONS: carrying, lifting, bending, standing, squatting, stairs, locomotion level, and caring for others   PARTICIPATION LIMITATIONS: meal prep, cleaning, laundry, shopping, and community activity   PERSONAL FACTORS: Fitness, Past/current experiences, Time since onset of injury/illness/exacerbation, and 1 comorbidity: high BMI  are also affecting patient's functional outcome.    REHAB POTENTIAL: Fair chronicity of issue   CLINICAL DECISION MAKING: Evolving/moderate complexity   EVALUATION COMPLEXITY: Moderate     GOALS:   SHORT TERM GOALS: Target date: 01/24/23   Pt will be Ind in an initial HEP Baseline: Initiated 01/31/23: min Goal status: ONGOING   2.  Pt will voice  understanding of measures to assist in pain reduction Baseline: Initiated 01/31/23:still experiencing 9/10 pain by end of day Goal status: ONGOING   LONG TERM GOALS: Target date: 02/28/23   Pt will be Ind in a final HEP to maintain achieved LOF Baseline:  initiated Goal status: INITIAL   2.  Pt will be able to complete 15 reps for 2 sets of bilat PF for improved function Baseline:  01/31/23:  2 sets of 15 - no increased pain  Goal status: MET   3.  Pt will be able to single leg stan R for 15 sec as indication of improved ankle stability Baseline: 3" 01/31/23: 30 sec  Goal status: MET   4.  Improve 5xSTS by MCID of 5" and 2MWT by MCID of 50f as indication of improved functional  mobility  Baseline: TBA Goal status: INITIAL   5.  Pt will report a decrease in R lateral ankle pain to 4/10 or less with daily activities for improved QOL Baseline: 4-10/10 Goal status: INITIAL     PLAN:   PT FREQUENCY: 2x/week   PT DURATION: 6 weeks   PLANNED INTERVENTIONS: Therapeutic exercises, Therapeutic activity, Balance training, Gait training, Patient/Family education, Self Care, Joint mobilization, Stair training, Aquatic Therapy, Dry Needling, Electrical stimulation, Cryotherapy, Moist heat, Taping, Vasopneumatic device, Ultrasound, Ionotophoresis 422mml Dexamethasone, Manual therapy, and Re-evaluation   PLAN FOR NEXT SESSION: ; assess hip and knee strength; assess response to HEP; progress therex as indicated; use of modalities, manual therapy; and TPDN as indicated.   Verneda Hollopeter MS, PT 02/04/23 5:10 PM

## 2023-02-05 NOTE — Therapy (Signed)
OUTPATIENT PHYSICAL THERAPY TREATMENT NOTE   Patient Name: Jasmine Jackson MRN: [id number] DOB:June 04, 1971, 52 y.o., female Today's Date: 02/06/2023  PCP: Iona Coach, MD  REFERRING PROVIDER: Criselda Peaches, DPM   END OF SESSION:   PT End of Session - 02/06/23 1817     Visit Number 7    Number of Visits 13    Date for PT Re-Evaluation 02/28/23    Authorization Type [employer] DUAL COMPLETE; MEDICAID [place] ACCESS    Progress Note Due on Visit 10    PT Start Time 1548    PT Stop Time 1640    PT Time Calculation (min) 52 min    Activity Tolerance Patient tolerated treatment well    Behavior During Therapy WFL for tasks assessed/performed              Past Medical History:  Diagnosis Date   Anemia    06/19/21-pt has no recollection of this dx   Anxiety    Anxiety and depression    Asthma    Bipolar 1 disorder ([place])    Depression    Excessive daytime sleepiness 11/18/2017   GERD (gastroesophageal reflux disease)    Heart murmur    Hemorrhoids    Hiatal hernia    Hypertension    Internal hemorrhoids    Irritable bowel disease 2014   Toe injury 11/18/2017   Past Surgical History:  Procedure Laterality Date   COLONOSCOPY  2021   HEMORRHOID SURGERY     INGUINAL HERNIA REPAIR     PARTIAL HYSTERECTOMY     fibroids   TONSILLECTOMY     UPPER GASTROINTESTINAL ENDOSCOPY     Patient Active Problem List   Diagnosis Date Noted   Sciatica without back pain 07/19/2022   Rectal bleeding 08/22/2021   Encounter for medication management 07/05/2021   Bipolar disorder in full remission ([place]) 04/18/2021   Anxiety 09/21/2020   PTSD (post-traumatic stress disorder) 09/21/2020   Weight loss 07/31/2020   Acanthosis nigricans 11/16/2019   Nocturnal leg cramps 10/18/2018   Hot flashes 11/27/2016   Tobacco use  10/18/2016   Panic attacks 09/09/2016   Health care maintenance 01/11/2016   Hypertension 04/24/2015   Back muscle spasm 04/24/2015   Bipolar disorder  ([place]) 04/16/2014   History of gout 04/01/2014   Prolapsed internal hemorrhoids, grade 3 12/08/2013   Irritable bowel syndrome 12/08/2013   Dyspepsia 12/08/2013    REFERRING DIAG: [id number] (ICD-10-CM) - Tear of peroneal tendon, right, initial encounter    THERAPY DIAG:  Chronic pain of right ankle  Muscle weakness (generalized)  Difficulty in walking, not elsewhere classified  Rationale for Evaluation and Treatment Rehabilitation  SUBJECTIVE:    SUBJECTIVE STATEMENT:   Pt reports her R ankle is stronger and does not hurt as much.   PERTINENT HISTORY: Anxiety and depression; Bipolar 1; HTN PAIN:  Are you having pain? Yes: NPRS scale: 4/10 Pain location: lateral R ankle Pain description: heat, sharp, throb Aggravating factors: prolonged standing and walking Relieving factors: Rest Pain range on eval 4-10/10 Pain range 02/04/23 4-8/10   PRECAUTIONS: None   WEIGHT BEARING RESTRICTIONS: No   FALLS:  Has patient fallen in last 6 months? No   LIVING ENVIRONMENT: Lives with: lives with their family            Pt reports no issue with accessing or mobility within home.   OCCUPATION: On disability   PLOF: Independent   PATIENT GOALS: Less pain and to tolerate being on her  feet more   NEXT MD VISIT:    OBJECTIVE: (objective measures completed at initial evaluation unless otherwise dated)   DIAGNOSTIC FINDINGS: MRI 04/30/2022 IMPRESSION:: IMPRESSION: 1. 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 2. Mild degenerative spurring at the distal aspect of the medial malleolus. 3. Mild-to-moderate degenerative changes of the posterior aspect of the posterior subtalar joint. 4. Mild talonavicular and second tarsometatarsal osteoarthritis.   PATIENT SURVEYS:  FOTO: Perceived function   41%, predicted   58%     COGNITION: Overall cognitive status: Within functional limits for tasks assessed                          SENSATION: WFL   EDEMA:  No observed swelling on eval   POSTURE:  pes planus, neutral knees   PALPATION: TTP of the lateral R ankle along the joint line ant to posterior.   LOWER EXTREMITY ROM: WNLs and equal R to L Active ROM Right eval Left eval  Hip flexion      Hip extension      Hip abduction      Hip adduction      Hip internal rotation      Hip external rotation      Knee flexion      Knee extension      Ankle dorsiflexion 10 10  Ankle plantarflexion      Ankle inversion      Ankle eversion       (Blank rows = not tested)   LOWER EXTREMITY MMT:   MMT Right eval Left eval  Hip flexion TBA TBA  Hip extension TBA TBA  Hip abduction TBA TBA  Hip adduction TBA TBA  Hip internal rotation TBA TBA  Hip external rotation TBA TBA  Knee flexion TBA TBA  Knee extension TBA TBA  Ankle dorsiflexion 4+ 4+  Ankle plantarflexion Unable to SL PF Unable to SL PF  Ankle inversion 4+ 4+  Ankle eversion 4 P 4+                         P = provoked pain (Blank rows = not tested)   LOWER EXTREMITY SPECIAL TESTS:  NT   FUNCTIONAL TESTS:  5 times sit to stand: TBA 01/15/23= 13.3" 2 minute walk test: TBA 01/15/23=505' SLS:  R 3"; L 20+" 01/31/23: Rt SLS 30sec   GAIT: Distance walked: 21f Assistive device utilized: None Level of assistance: Complete Independence Comments: walks with out toeing c pronation    [facility]                                                [facility]: [facility]23 Therapeutic Exercise: Nustep L5 6 minutes UE/LE Banded side steps at knees BluTB 20' x10 Banded side steps at feet Ankle PF with towel roll at toes 2x10 STS on airex 2x10 Gastroc and soleus stretches bilateral with min cues  Manual Therapy: STM to the lateral R calf c focus on the peroneal muscles. Modalities: Ice pack to right ankle x 10 min  [facility] Adult PT Treatment:  DATE: 02/04/23 Therapeutic  Exercise: Nustep L5 6 minutes UE/LE Gastroc and soleus stretches bilateral with min cues  15 reps heel raise x 2 no increased ankle pain Slant board stretch 30 sec  Rocker board x2 2 mins forward/backward and laterally S/L clams 2x15 each GTB Lateral step ups 2" box c 3" airex 10 reps squat to chair tap x 2 Modalities: Ice pack to right ankle x 10 min  [facility] Adult PT Treatment:                                                DATE: 01/31/23 Therapeutic Exercise: Nustep L5 5 minutes UE/LE Gastroc and soleus stretches bilateral with min cues  15 reps heel raise x 2 no increased ankle pain Slant board stretch 30 sec  4 inch step downs SLS" 30 sec Right best  30 sec trials on foam oval  10 reps squat to chair tap x 2 Modalities: Ice pack to right ankle x 10 min    PATIENT EDUCATION:  Education details: Eval findings, POC, HEP, self care Person educated: Patient Education method: Explanation, Demonstration, Tactile cues, Verbal cues, and Handouts Education comprehension: verbalized understanding, returned demonstration, verbal cues required, and tactile cues required   HOME EXERCISE PROGRAM: Access Code: [email or web address] URL: [email or web address] Date: 01/15/2023 Prepared by: Gar Ponto  Exercises - Gastroc Stretch on Wall  - 1 x daily - 7 x weekly - 1 sets - 3 reps - 30 hold - Soleus Stretch on Wall  - 1 x daily - 7 x weekly - 1 sets - 3 reps - 30 hold - Long Sitting Ankle Plantar Flexion with Resistance (Mirrored)  - 1 x daily - 7 x weekly - 2 sets - 10 reps - 3 hold - Long Sitting Ankle Eversion with Resistance (Mirrored)  - 1 x daily - 7 x weekly - 2 sets - 10 reps - 3 hold - Long Sitting Ankle Inversion with Resistance  - 1 x daily - 7 x weekly - 2 sets - 10 reps - 3 hold - Seated Ankle Dorsiflexion with Resistance  - 1 x daily - 7 x weekly - 2 sets - 10 reps - 3 hold - Standing Single Leg Stance with Counter Support  - 1 x daily - 7 x weekly - 3 sets - 5 reps - 30  hold Added 01/31/23 - Sit to stand with control  - 1 x daily - 7 x weekly - 2 sets - 10 reps   ASSESSMENT:   CLINICAL IMPRESSION: PT was continued for R lateral calf STM with focus on the peroneal muscles. Areas of taut muscle bands were palpated. Pt then completed strengthening therex for the R ankle and hip with emphasis on lateral stability. Pt tolerated PT today without adverse effects. Discussed the option of TPDN to address R ankle/foot pain and function. Pt is making appropriate progress with PT intervention. Will reassess FOTO next week.   OBJECTIVE IMPAIRMENTS: decreased activity tolerance, decreased balance, difficulty walking, decreased strength, obesity, and pain.    ACTIVITY LIMITATIONS: carrying, lifting, bending, standing, squatting, stairs, locomotion level, and caring for others   PARTICIPATION LIMITATIONS: meal prep, cleaning, laundry, shopping, and community activity   PERSONAL FACTORS: Fitness, Past/current experiences, Time since onset of injury/illness/exacerbation, and 1 comorbidity: high BMI  are also affecting patient's functional outcome.    REHAB POTENTIAL: Fair  chronicity of issue   CLINICAL DECISION MAKING: Evolving/moderate complexity   EVALUATION COMPLEXITY: Moderate     GOALS:   SHORT TERM GOALS: Target date: 01/24/23   Pt will be Ind in an initial HEP Baseline: Initiated 01/31/23: min Goal status: ONGOING   2.  Pt will voice understanding of measures to assist in pain reduction Baseline: Initiated 01/31/23:still experiencing 9/10 pain by end of day Goal status: ONGOING   LONG TERM GOALS: Target date: 02/28/23   Pt will be Ind in a final HEP to maintain achieved LOF Baseline:  initiated Goal status: INITIAL   2.  Pt will be able to complete 15 reps for 2 sets of bilat PF for improved function Baseline:  01/31/23:  2 sets of 15 - no increased pain  Goal status: MET   3.  Pt will be able to single leg stan R for 15 sec as indication of improved ankle  stability Baseline: 3" 01/31/23: 30 sec  Goal status: MET   4.  Improve 5xSTS by MCID of 5" and 2MWT by MCID of 33f as indication of improved functional mobility  Baseline: completed 01/15/23 Goal status: INITIAL   5.  Pt will report a decrease in R lateral ankle pain to 4/10 or less with daily activities for improved QOL Baseline: 4-10/10 Goal status: INITIAL     PLAN:   PT FREQUENCY: 2x/week   PT DURATION: 6 weeks   PLANNED INTERVENTIONS: Therapeutic exercises, Therapeutic activity, Balance training, Gait training, Patient/Family education, Self Care, Joint mobilization, Stair training, Aquatic Therapy, Dry Needling, Electrical stimulation, Cryotherapy, Moist heat, Taping, Vasopneumatic device, Ultrasound, Ionotophoresis 468mml Dexamethasone, Manual therapy, and Re-evaluation   PLAN FOR NEXT SESSION: ; assess hip and knee strength; assess response to HEP; progress therex as indicated; use of modalities, manual therapy; and TPDN as indicated.   Dierdra Salameh MS, PT 02/06/23 6:29 PM

## 2023-02-06 ENCOUNTER — Ambulatory Visit: Payer: 59

## 2023-02-06 DIAGNOSIS — R262 Difficulty in walking, not elsewhere classified: Secondary | ICD-10-CM

## 2023-02-06 DIAGNOSIS — M25571 Pain in right ankle and joints of right foot: Secondary | ICD-10-CM | POA: Diagnosis not present

## 2023-02-06 DIAGNOSIS — G8929 Other chronic pain: Secondary | ICD-10-CM

## 2023-02-06 DIAGNOSIS — M6281 Muscle weakness (generalized): Secondary | ICD-10-CM

## 2023-02-12 NOTE — Therapy (Signed)
OUTPATIENT PHYSICAL THERAPY TREATMENT NOTE   Patient Name: Jasmine Jackson MRN: [id number] DOB:28-May-1971, 51 y.o., female Today's Date: 02/13/2023  PCP: Iona Coach, MD  REFERRING PROVIDER: Criselda Peaches, DPM   END OF SESSION:   PT End of Session - 02/13/23 1606     Visit Number 8    Number of Visits 13    Date for PT Re-Evaluation 02/28/23    Authorization Type [employer] DUAL COMPLETE; MEDICAID [place] ACCESS    Progress Note Due on Visit 10    PT Start Time [id number]    PT Stop Time 1632    PT Time Calculation (min) 44 min    Activity Tolerance Patient tolerated treatment well    Behavior During Therapy [facility] for tasks assessed/performed              Past Medical History:  Diagnosis Date   Anemia    06/19/21-pt has no recollection of this dx   Anxiety    Anxiety and depression    Asthma    Bipolar 1 disorder ([place])    Depression    Excessive daytime sleepiness 11/18/2017   GERD (gastroesophageal reflux disease)    Heart murmur    Hemorrhoids    Hiatal hernia    Hypertension    Internal hemorrhoids    Irritable bowel disease 2014   Toe injury 11/18/2017   Past Surgical History:  Procedure Laterality Date   COLONOSCOPY  2021   HEMORRHOID SURGERY     INGUINAL HERNIA REPAIR     PARTIAL HYSTERECTOMY     fibroids   TONSILLECTOMY     UPPER GASTROINTESTINAL ENDOSCOPY     Patient Active Problem List   Diagnosis Date Noted   Sciatica without back pain 07/19/2022   Rectal bleeding 08/22/2021   Encounter for medication management 07/05/2021   Bipolar disorder in full remission ([place]) 04/18/2021   Anxiety 09/21/2020   PTSD (post-traumatic stress disorder) 09/21/2020   Weight loss 07/31/2020   Acanthosis nigricans 11/16/2019   Nocturnal leg cramps 10/18/2018   Hot flashes 11/27/2016   Tobacco use  10/18/2016   Panic attacks 09/09/2016   Health care maintenance 01/11/2016   Hypertension 04/24/2015   Back muscle spasm 04/24/2015   Bipolar disorder  ([place]) 04/16/2014   History of gout 04/01/2014   Prolapsed internal hemorrhoids, grade 3 12/08/2013   Irritable bowel syndrome 12/08/2013   Dyspepsia 12/08/2013    REFERRING DIAG: [id number] (ICD-10-CM) - Tear of peroneal tendon, right, initial encounter    THERAPY DIAG:  Chronic pain of right ankle  Muscle weakness (generalized)  Difficulty in walking, not elsewhere classified  Rationale for Evaluation and Treatment Rehabilitation  SUBJECTIVE:    SUBJECTIVE STATEMENT:   My R ankle pain has decreased a little.   PERTINENT HISTORY: Anxiety and depression; Bipolar 1; HTN PAIN:  Are you having pain? Yes: NPRS scale: 3/10 Pain location: lateral R ankle Pain description: heat, sharp, throb Aggravating factors: prolonged standing and walking Relieving factors: Rest Pain range on eval 4-10/10 Pain range 02/04/23 4-8/10   PRECAUTIONS: None   WEIGHT BEARING RESTRICTIONS: No   FALLS:  Has patient fallen in last 6 months? No   LIVING ENVIRONMENT: Lives with: lives with their family            Pt reports no issue with accessing or mobility within home.   OCCUPATION: On disability   PLOF: Independent   PATIENT GOALS: Less pain and to tolerate being on her feet more   NEXT  MD VISIT:    OBJECTIVE: (objective measures completed at initial evaluation unless otherwise dated)   DIAGNOSTIC FINDINGS: MRI 04/30/2022 IMPRESSION:: IMPRESSION: 1. 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 2. Mild degenerative spurring at the distal aspect of the medial malleolus. 3. Mild-to-moderate degenerative changes of the posterior aspect of the posterior subtalar joint. 4. Mild talonavicular and second tarsometatarsal osteoarthritis.   PATIENT SURVEYS:  FOTO: Perceived function   41%, predicted   58%     COGNITION: Overall cognitive status: Within functional limits for tasks assessed                         SENSATION: WFL   EDEMA:  No  observed swelling on eval   POSTURE:  pes planus, neutral knees   PALPATION: TTP of the lateral R ankle along the joint line ant to posterior.   LOWER EXTREMITY ROM: WNLs and equal R to L Active ROM Right eval Left eval  Hip flexion      Hip extension      Hip abduction      Hip adduction      Hip internal rotation      Hip external rotation      Knee flexion      Knee extension      Ankle dorsiflexion 10 10  Ankle plantarflexion      Ankle inversion      Ankle eversion       (Blank rows = not tested)   LOWER EXTREMITY MMT:   MMT Right eval Left eval  Hip flexion TBA TBA  Hip extension TBA TBA  Hip abduction TBA TBA  Hip adduction TBA TBA  Hip internal rotation TBA TBA  Hip external rotation TBA TBA  Knee flexion TBA TBA  Knee extension TBA TBA  Ankle dorsiflexion 4+ 4+  Ankle plantarflexion Unable to SL PF Unable to SL PF  Ankle inversion 4+ 4+  Ankle eversion 4 P 4+                         P = provoked pain (Blank rows = not tested)   LOWER EXTREMITY SPECIAL TESTS:  NT   FUNCTIONAL TESTS:  5 times sit to stand: TBA 01/15/23= 13.3" 2 minute walk test: TBA 01/15/23=505' SLS:  R 3"; L 20+" 01/31/23: Rt SLS 30sec   GAIT: Distance walked: 262f Assistive device utilized: None Level of assistance: Complete Independence Comments: walks with out toeing c pronation    [facility]                                                DATE: 02/13/23 Therapeutic Exercise: Rec Bike 4 mins L6 Gastroc and soleus stretches bilateral with min cues  Ankle everter stretch c strap Ankle evertion c RTB 3x10 Standing PF with towel  x10 Lateral step ups on airex 2x10 R SL balance on airex Manual Therapy: STM and DTM to the lateral R calf c focus on the peroneal muscles Skilled palpation to identify taut muscle bands and TrPs  Trigger Point Dry Needling Treatment: Pre-treatment instruction: Patient instructed on dry needling rationale,  procedures, and possible side effects including pain during treatment (achy,cramping feeling), bruising, drop of blood, lightheadedness, nausea, sweating. Patient Consent Given:  No Education handout provided: Yes Muscles treated: Peroneal longus and brevis  Needle size and number:  [id number] 1 Electrical stimulation performed: No Parameters:  NA Treatment response/outcome: Twitch response elicited Post-treatment instructions: Patient instructed to expect possible mild to moderate muscle soreness later today and/or tomorrow. Patient instructed in methods to reduce muscle soreness and to continue prescribed HEP. If patient was dry needled over the lung field, patient was instructed on signs and symptoms of pneumothorax and, however unlikely, to see immediate medical attention should they occur. Patient was also educated on signs and symptoms of infection and to seek medical attention should they occur. Patient verbalized understanding of these instructions and education.    [facility]                                                DATE: 02/06/23 Therapeutic Exercise: Nustep L5 6 minutes UE/LE Banded side steps at knees BluTB 20' x10 Banded side steps at feet Ankle PF with towel roll at toes 2x10 STS on airex 2x10 Gastroc and soleus stretches bilateral with min cues  Manual Therapy: STM to the lateral R calf c focus on the peroneal muscles. Modalities: Ice pack to right ankle x 10 min  [facility] Adult PT Treatment:                                                DATE: 02/04/23 Therapeutic Exercise: Nustep L5 6 minutes UE/LE Gastroc and soleus stretches bilateral with min cues  15 reps heel raise x 2 no increased ankle pain Slant board stretch 30 sec  Rocker board x2 2 mins forward/backward and laterally S/L clams 2x15 each GTB Lateral step ups 2" box c 3" airex 10 reps squat to chair tap x 2 Modalities: Ice pack to right ankle x 10 min  [facility] Adult PT Treatment:                                                 DATE: 01/31/23 Therapeutic Exercise: Nustep L5 5 minutes UE/LE Gastroc and soleus stretches bilateral with min cues  15 reps heel raise x 2 no increased ankle pain Slant board stretch 30 sec  4 inch step downs SLS" 30 sec Right best  30 sec trials on foam oval  10 reps squat to chair tap x 2 Modalities: Ice pack to right ankle x 10 min    PATIENT EDUCATION:  Education details: Eval findings, POC, HEP, self care Person educated: Patient Education method: Explanation, Demonstration, Tactile cues, Verbal cues, and Handouts Education comprehension: verbalized understanding, returned demonstration, verbal cues required, and tactile cues required   HOME EXERCISE PROGRAM: Access Code: [email or web address]: [email or web address] Date: 01/15/2023 Prepared by: AGar Ponto Exercises - Gastroc Stretch on Wall  - 1 x daily - 7 x weekly - 1 sets - 3 reps - 30 hold - Soleus Stretch on Wall  - 1 x daily - 7 x weekly - 1 sets - 3 reps - 30 hold - Long Sitting Ankle Plantar Flexion with Resistance (Mirrored)  - 1 x daily - 7  x weekly - 2 sets - 10 reps - 3 hold - Long Sitting Ankle Eversion with Resistance (Mirrored)  - 1 x daily - 7 x weekly - 2 sets - 10 reps - 3 hold - Long Sitting Ankle Inversion with Resistance  - 1 x daily - 7 x weekly - 2 sets - 10 reps - 3 hold - Seated Ankle Dorsiflexion with Resistance  - 1 x daily - 7 x weekly - 2 sets - 10 reps - 3 hold - Standing Single Leg Stance with Counter Support  - 1 x daily - 7 x weekly - 3 sets - 5 reps - 30 hold Added 01/31/23 - Sit to stand with control  - 1 x daily - 7 x weekly - 2 sets - 10 reps   ASSESSMENT:   CLINICAL IMPRESSION: PT was completed for manual therapy as noted above f/b TPDN to the peroneal longus and brevis. Therex was then completed for for flexibility and strengthening for muscle activation. A cold pack was applied at the end of the session for symptom management. Pt tolerated PT today  without adverse effects. Will assess pt'scomplete response to today's TPDN when she returns to PT tomorrow. Will reassess FOTO tomorrow.   OBJECTIVE IMPAIRMENTS: decreased activity tolerance, decreased balance, difficulty walking, decreased strength, obesity, and pain.    ACTIVITY LIMITATIONS: carrying, lifting, bending, standing, squatting, stairs, locomotion level, and caring for others   PARTICIPATION LIMITATIONS: meal prep, cleaning, laundry, shopping, and community activity   PERSONAL FACTORS: Fitness, Past/current experiences, Time since onset of injury/illness/exacerbation, and 1 comorbidity: high BMI  are also affecting patient's functional outcome.    REHAB POTENTIAL: Fair chronicity of issue   CLINICAL DECISION MAKING: Evolving/moderate complexity   EVALUATION COMPLEXITY: Moderate     GOALS:   SHORT TERM GOALS: Target date: 01/24/23   Pt will be Ind in an initial HEP Baseline: Initiated 01/31/23: min Goal status: ONGOING   2.  Pt will voice understanding of measures to assist in pain reduction Baseline: Initiated 01/31/23:still experiencing 9/10 pain by end of day Goal status: ONGOING   LONG TERM GOALS: Target date: 02/28/23   Pt will be Ind in a final HEP to maintain achieved LOF Baseline:  initiated Goal status: INITIAL   2.  Pt will be able to complete 15 reps for 2 sets of bilat PF for improved function Baseline:  01/31/23:  2 sets of 15 - no increased pain  Goal status: MET   3.  Pt will be able to single leg stan R for 15 sec as indication of improved ankle stability Baseline: 3" 01/31/23: 30 sec  Goal status: MET   4.  Improve 5xSTS by MCID of 5" and 2MWT by MCID of 2f as indication of improved functional mobility  Baseline: completed 01/15/23 Goal status: INITIAL   5.  Pt will report a decrease in R lateral ankle pain to 4/10 or less with daily activities for improved QOL Baseline: 4-10/10 Goal status: INITIAL     PLAN:   PT FREQUENCY: 2x/week   PT  DURATION: 6 weeks   PLANNED INTERVENTIONS: Therapeutic exercises, Therapeutic activity, Balance training, Gait training, Patient/Family education, Self Care, Joint mobilization, Stair training, Aquatic Therapy, Dry Needling, Electrical stimulation, Cryotherapy, Moist heat, Taping, Vasopneumatic device, Ultrasound, Ionotophoresis 425mml Dexamethasone, Manual therapy, and Re-evaluation   PLAN FOR NEXT SESSION: ; assess hip and knee strength; assess response to HEP; progress therex as indicated; use of modalities, manual therapy; and TPDN as indicated.  Shekela Goodridge MS, PT 02/13/23 5:51 PM

## 2023-02-13 ENCOUNTER — Ambulatory Visit: Payer: 59

## 2023-02-13 DIAGNOSIS — M6281 Muscle weakness (generalized): Secondary | ICD-10-CM

## 2023-02-13 DIAGNOSIS — M25571 Pain in right ankle and joints of right foot: Secondary | ICD-10-CM | POA: Diagnosis not present

## 2023-02-13 DIAGNOSIS — G8929 Other chronic pain: Secondary | ICD-10-CM

## 2023-02-13 DIAGNOSIS — R262 Difficulty in walking, not elsewhere classified: Secondary | ICD-10-CM

## 2023-02-13 NOTE — Patient Instructions (Signed)

## 2023-02-14 ENCOUNTER — Ambulatory Visit: Payer: 59

## 2023-02-18 ENCOUNTER — Ambulatory Visit: Payer: 59

## 2023-02-18 DIAGNOSIS — M6281 Muscle weakness (generalized): Secondary | ICD-10-CM

## 2023-02-18 DIAGNOSIS — G8929 Other chronic pain: Secondary | ICD-10-CM

## 2023-02-18 DIAGNOSIS — R262 Difficulty in walking, not elsewhere classified: Secondary | ICD-10-CM

## 2023-02-18 DIAGNOSIS — M25571 Pain in right ankle and joints of right foot: Secondary | ICD-10-CM | POA: Diagnosis not present

## 2023-02-18 NOTE — Therapy (Signed)
OUTPATIENT PHYSICAL THERAPY TREATMENT NOTE   Patient Name: Jasmine Jackson MRN: [id number] DOB:09-24-1971, 52 y.o., female Today's Date: 02/18/2023  PCP: Iona Coach, MD  REFERRING PROVIDER: Criselda Peaches, DPM   END OF SESSION:   PT End of Session - 02/18/23 1550     Visit Number 9    Number of Visits 13    Date for PT Re-Evaluation 02/28/23    Authorization Type [employer] DUAL COMPLETE; MEDICAID [place] ACCESS    Progress Note Due on Visit 10    PT Start Time 1548    PT Stop Time 1640    PT Time Calculation (min) 52 min    Activity Tolerance Patient tolerated treatment well    Behavior During Therapy WFL for tasks assessed/performed               Past Medical History:  Diagnosis Date   Anemia    06/19/21-pt has no recollection of this dx   Anxiety    Anxiety and depression    Asthma    Bipolar 1 disorder ([place])    Depression    Excessive daytime sleepiness 11/18/2017   GERD (gastroesophageal reflux disease)    Heart murmur    Hemorrhoids    Hiatal hernia    Hypertension    Internal hemorrhoids    Irritable bowel disease 2014   Toe injury 11/18/2017   Past Surgical History:  Procedure Laterality Date   COLONOSCOPY  2021   HEMORRHOID SURGERY     INGUINAL HERNIA REPAIR     PARTIAL HYSTERECTOMY     fibroids   TONSILLECTOMY     UPPER GASTROINTESTINAL ENDOSCOPY     Patient Active Problem List   Diagnosis Date Noted   Sciatica without back pain 07/19/2022   Rectal bleeding 08/22/2021   Encounter for medication management 07/05/2021   Bipolar disorder in full remission ([place]) 04/18/2021   Anxiety 09/21/2020   PTSD (post-traumatic stress disorder) 09/21/2020   Weight loss 07/31/2020   Acanthosis nigricans 11/16/2019   Nocturnal leg cramps 10/18/2018   Hot flashes 11/27/2016   Tobacco use  10/18/2016   Panic attacks 09/09/2016   Health care maintenance 01/11/2016   Hypertension 04/24/2015   Back muscle spasm 04/24/2015   Bipolar disorder  ([place]) 04/16/2014   History of gout 04/01/2014   Prolapsed internal hemorrhoids, grade 3 12/08/2013   Irritable bowel syndrome 12/08/2013   Dyspepsia 12/08/2013    REFERRING DIAG: [id number] (ICD-10-CM) - Tear of peroneal tendon, right, initial encounter    THERAPY DIAG:  Chronic pain of right ankle  Muscle weakness (generalized)  Difficulty in walking, not elsewhere classified  Rationale for Evaluation and Treatment Rehabilitation  SUBJECTIVE:    SUBJECTIVE STATEMENT:   After the TPDN last week, the pt reports her R calf does not feel as tight and the pain is at a lower level more often. She is not using cold packs as often   PERTINENT HISTORY: Anxiety and depression; Bipolar 1; HTN PAIN:  Are you having pain? Yes: NPRS scale: 3/10 Pain location: lateral R ankle Pain description: heat, sharp, throb Aggravating factors: prolonged standing and walking Relieving factors: Rest Pain range on eval 4-10/10 Pain range 02/04/23 4-8/10 Pain range 02/18/23 3-7/10   PRECAUTIONS: None   WEIGHT BEARING RESTRICTIONS: No   FALLS:  Has patient fallen in last 6 months? No   LIVING ENVIRONMENT: Lives with: lives with their family            Pt reports no issue with  accessing or mobility within home.   OCCUPATION: On disability   PLOF: Independent   PATIENT GOALS: Less pain and to tolerate being on her feet more   NEXT MD VISIT:    OBJECTIVE: (objective measures completed at initial evaluation unless otherwise dated)   DIAGNOSTIC FINDINGS: MRI 04/30/2022 IMPRESSION:: IMPRESSION: 1. 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 2. Mild degenerative spurring at the distal aspect of the medial malleolus. 3. Mild-to-moderate degenerative changes of the posterior aspect of the posterior subtalar joint. 4. Mild talonavicular and second tarsometatarsal osteoarthritis.   PATIENT SURVEYS:  FOTO: Perceived function   41%, predicted   58%      COGNITION: Overall cognitive status: Within functional limits for tasks assessed                         SENSATION: WFL   EDEMA:  No observed swelling on eval   POSTURE:  pes planus, neutral knees   PALPATION: TTP of the lateral R ankle along the joint line ant to posterior.   LOWER EXTREMITY ROM: WNLs and equal R to L Active ROM Right eval Left eval Rt 02/18/23  Hip flexion       Hip extension       Hip abduction       Hip adduction       Hip internal rotation       Hip external rotation       Knee flexion       Knee extension       Ankle dorsiflexion '10 10 17  '$ Ankle plantarflexion       Ankle inversion       Ankle eversion        (Blank rows = not tested)   LOWER EXTREMITY MMT:   MMT Right eval Left eval  Hip flexion TBA TBA  Hip extension TBA TBA  Hip abduction TBA TBA  Hip adduction TBA TBA  Hip internal rotation TBA TBA  Hip external rotation TBA TBA  Knee flexion TBA TBA  Knee extension TBA TBA  Ankle dorsiflexion 4+ 4+  Ankle plantarflexion Unable to SL PF Unable to SL PF  Ankle inversion 4+ 4+  Ankle eversion 4 P 4+                         P = provoked pain (Blank rows = not tested)   LOWER EXTREMITY SPECIAL TESTS:  NT   FUNCTIONAL TESTS:  5 times sit to stand: TBA 01/15/23= 13.3" 2 minute walk test: TBA 01/15/23=505' SLS:  R 3"; L 20+" 01/31/23: Rt SLS 30sec   GAIT: Distance walked: 228f Assistive device utilized: None Level of assistance: Complete Independence Comments: walks with out toeing c pronation    [facility]                                                [facility]: 02/18/23 Therapeutic Exercise: Rec Bike 5 mins L4 Gastroc and soleus stretches bilateral with min cues  Ankle everter stretch c strap Alt step bosu presses 2x10 each Standing PF with towel  2x10 Hip abd cybex 2x10 17.5# Leg press cybex 2x10 Therapeutic Activity: FOTO completed and results reviewed Modalities: Ice pack to right  ankle x 10 min  [place] Adult PT Treatment:                                                DATE: 02/13/23 Therapeutic Exercise: Rec Bike 4 mins L6 Gastroc and soleus stretches bilateral with min cues  Ankle everter stretch c strap Ankle evertion c RTB 3x10 Standing PF with towel  x10 Lateral step ups on airex 2x10 R SL balance on airex Manual Therapy: STM and DTM to the lateral R calf c focus on the peroneal muscles Skilled palpation to identify taut muscle bands and TrPs  Trigger Point Dry Needling Treatment: Pre-treatment instruction: Patient instructed on dry needling rationale, procedures, and possible side effects including pain during treatment (achy,cramping feeling), bruising, drop of blood, lightheadedness, nausea, sweating. Patient Consent Given: No Education handout provided: Yes Muscles treated: Peroneal longus and brevis  Needle size and number:  38mx.30 1 Electrical stimulation performed: No Parameters:  NA Treatment response/outcome: Twitch response elicited Post-treatment instructions: Patient instructed to expect possible mild to moderate muscle soreness later today and/or tomorrow. Patient instructed in methods to reduce muscle soreness and to continue prescribed HEP. If patient was dry needled over the lung field, patient was instructed on signs and symptoms of pneumothorax and, however unlikely, to see immediate medical attention should they occur. Patient was also educated on signs and symptoms of infection and to seek medical attention should they occur. Patient verbalized understanding of these instructions and education.    [facility]                                                DATE: 02/06/23 Therapeutic Exercise: Nustep L5 6 minutes UE/LE Banded side steps at knees BluTB 20' x10 Banded side steps at feet Ankle PF with towel roll at toes 2x10 STS on airex 2x10 Gastroc and soleus stretches bilateral with min cues  Manual Therapy: STM to the  lateral R calf c focus on the peroneal muscles. Modalities: Ice pack to right ankle x 10 min    PATIENT EDUCATION:  Education details: Eval findings, POC, HEP, self care Person educated: Patient Education method: Explanation, Demonstration, Tactile cues, Verbal cues, and Handouts Education comprehension: verbalized understanding, returned demonstration, verbal cues required, and tactile cues required   HOME EXERCISE PROGRAM: Access Code: [email or web address]: [email or web address] Date: 01/15/2023 Prepared by: AGar Ponto Exercises - Gastroc Stretch on Wall  - 1 x daily - 7 x weekly - 1 sets - 3 reps - 30 hold - Soleus Stretch on Wall  - 1 x daily - 7 x weekly - 1 sets - 3 reps - 30 hold - Long Sitting Ankle Plantar Flexion with Resistance (Mirrored)  - 1 x daily - 7 x weekly - 2 sets - 10 reps - 3 hold - Long Sitting Ankle Eversion with Resistance (Mirrored)  - 1 x daily - 7 x weekly - 2 sets - 10 reps - 3 hold - Long Sitting Ankle Inversion with Resistance  - 1 x daily - 7 x weekly - 2 sets - 10 reps - 3 hold - Seated Ankle Dorsiflexion with Resistance  - 1 x daily - 7 x weekly - 2 sets - 10 reps - 3 hold - Standing  Single Leg Stance with Counter Support  - 1 x daily - 7 x weekly - 3 sets - 5 reps - 30 hold Added 01/31/23 - Sit to stand with control  - 1 x daily - 7 x weekly - 2 sets - 10 reps   ASSESSMENT:   CLINICAL IMPRESSION: PT was completed for for R ankle and hip strengthening primarily in the [facility]. Pt's R ankle DF ROM and FOTO were assessed with both making good improvement. FOTO reflects pt's subjective report. Pt tolerated PT today without adverse effects. Pt will continue to benefit from skilled PT to address impairments for improved function with less pain. Reassess 5xSTS and 2MWT.  OBJECTIVE IMPAIRMENTS: decreased activity tolerance, decreased balance, difficulty walking, decreased strength, obesity, and pain.    ACTIVITY LIMITATIONS: carrying, lifting, bending,  standing, squatting, stairs, locomotion level, and caring for others   PARTICIPATION LIMITATIONS: meal prep, cleaning, laundry, shopping, and community activity   PERSONAL FACTORS: Fitness, Past/current experiences, Time since onset of injury/illness/exacerbation, and 1 comorbidity: high BMI  are also affecting patient's functional outcome.    REHAB POTENTIAL: Fair chronicity of issue   CLINICAL DECISION MAKING: Evolving/moderate complexity   EVALUATION COMPLEXITY: Moderate     GOALS:   SHORT TERM GOALS: Target date: 01/24/23   Pt will be Ind in an initial HEP Baseline: Initiated 01/31/23: min Goal status: MET   2.  Pt will voice understanding of measures to assist in pain reduction Baseline: Initiated 01/31/23:still experiencing 9/10 pain by end of day. 02/18/23=uses cold packs as needed Goal status: MET   LONG TERM GOALS: Target date: 02/28/23   Pt will be Ind in a final HEP to maintain achieved LOF Baseline:  initiated Goal status: INITIAL   2.  Pt will be able to complete 15 reps for 2 sets of bilat PF for improved function Baseline:  01/31/23:  2 sets of 15 - no increased pain  Goal status: MET   3.  Pt will be able to single leg stan R for 15 sec as indication of improved ankle stability Baseline: 3" 01/31/23: 30 sec  Goal status: MET   4.  Improve 5xSTS by MCID of 5" and 2MWT by MCID of 36f as indication of improved functional mobility  Baseline: completed 01/15/23 Goal status: INITIAL   5.  Pt will report a decrease in R lateral ankle pain to 4/10 or less with daily activities for improved QOL Baseline: 4-10/10 Goal status: INITIAL     PLAN:   PT FREQUENCY: 2x/week   PT DURATION: 6 weeks   PLANNED INTERVENTIONS: Therapeutic exercises, Therapeutic activity, Balance training, Gait training, Patient/Family education, Self Care, Joint mobilization, Stair training, Aquatic Therapy, Dry Needling, Electrical stimulation, Cryotherapy, Moist heat, Taping, Vasopneumatic  device, Ultrasound, Ionotophoresis '4mg'$ /ml Dexamethasone, Manual therapy, and Re-evaluation   PLAN FOR NEXT SESSION: ; assess hip and knee strength; assess response to HEP; progress therex as indicated; use of modalities, manual therapy; and TPDN as indicated.   Ajani Schnieders MS, PT 02/18/23 5:10 PM

## 2023-02-19 NOTE — Therapy (Signed)
OUTPATIENT PHYSICAL THERAPY TREATMENT NOTE/Progress Note   Patient Name: Jasmine Jackson MRN: [id number] DOB:11/25/1971, 52 y.o., female Today's Date: 02/21/2023  PCP: Iona Coach, MD  REFERRING PROVIDER: Criselda Peaches, DPM   Progress Note Reporting Period 01/07/23 to 02/20/23  See note below for Objective Data and Assessment of Progress/Goals.      END OF SESSION:   PT End of Session - 02/20/23 1558     Visit Number 10    Number of Visits 13    Date for PT Re-Evaluation 02/28/23    Authorization Type UNITEDHEALTHCARE DUAL COMPLETE; MEDICAID [place] ACCESS    Progress Note Due on Visit 10    PT Start Time 1550    PT Stop Time 1640    PT Time Calculation (min) 50 min    Activity Tolerance Patient tolerated treatment well    Behavior During Therapy WFL for tasks assessed/performed               Past Medical History:  Diagnosis Date   Anemia    06/19/21-pt has no recollection of this dx   Anxiety    Anxiety and depression    Asthma    Bipolar 1 disorder ([place])    Depression    Excessive daytime sleepiness 11/18/2017   GERD (gastroesophageal reflux disease)    Heart murmur    Hemorrhoids    Hiatal hernia    Hypertension    Internal hemorrhoids    Irritable bowel disease 2014   Toe injury 11/18/2017   Past Surgical History:  Procedure Laterality Date   COLONOSCOPY  2021   HEMORRHOID SURGERY     INGUINAL HERNIA REPAIR     PARTIAL HYSTERECTOMY     fibroids   TONSILLECTOMY     UPPER GASTROINTESTINAL ENDOSCOPY     Patient Active Problem List   Diagnosis Date Noted   Sciatica without back pain 07/19/2022   Rectal bleeding 08/22/2021   Encounter for medication management 07/05/2021   Bipolar disorder in full remission ([place]) 04/18/2021   Anxiety 09/21/2020   PTSD (post-traumatic stress disorder) 09/21/2020   Weight loss 07/31/2020   Acanthosis nigricans 11/16/2019   Nocturnal leg cramps 10/18/2018   Hot flashes 11/27/2016   Tobacco use  10/18/2016    Panic attacks 09/09/2016   Health care maintenance 01/11/2016   Hypertension 04/24/2015   Back muscle spasm 04/24/2015   Bipolar disorder ([place]) 04/16/2014   History of gout 04/01/2014   Prolapsed internal hemorrhoids, grade 3 12/08/2013   Irritable bowel syndrome 12/08/2013   Dyspepsia 12/08/2013    REFERRING DIAG: [id number] (ICD-10-CM) - Tear of peroneal tendon, right, initial encounter    THERAPY DIAG:  Chronic pain of right ankle  Muscle weakness (generalized)  Difficulty in walking, not elsewhere classified  Rationale for Evaluation and Treatment Rehabilitation  SUBJECTIVE:    SUBJECTIVE STATEMENT:  My R ankle feels pretty good today. I'm able to go up/down 13 steps 4x a day with a lot less difficulty.   PERTINENT HISTORY: Anxiety and depression; Bipolar 1; HTN PAIN:  Are you having pain? Yes: NPRS scale: 3/10 Pain location: lateral R ankle Pain description: heat, sharp, throb Aggravating factors: prolonged standing and walking Relieving factors: Rest Pain range on eval 4-10/10 Pain range 02/04/23 4-8/10 Pain range 02/18/23 3-7/10   PRECAUTIONS: None   WEIGHT BEARING RESTRICTIONS: No   FALLS:  Has patient fallen in last 6 months? No   LIVING ENVIRONMENT: Lives with: lives with their family  Pt reports no issue with accessing or mobility within home.   OCCUPATION: On disability   PLOF: Independent   PATIENT GOALS: Less pain and to tolerate being on her feet more   NEXT MD VISIT:    OBJECTIVE: (objective measures completed at initial evaluation unless otherwise dated)   DIAGNOSTIC FINDINGS: MRI 04/30/2022 IMPRESSION:: IMPRESSION: 1. 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 2. Mild degenerative spurring at the distal aspect of the medial malleolus. 3. Mild-to-moderate degenerative changes of the posterior aspect of the posterior subtalar joint. 4. Mild talonavicular and second  tarsometatarsal osteoarthritis.   PATIENT SURVEYS:  FOTO: Perceived function   41%, predicted   58%     COGNITION: Overall cognitive status: Within functional limits for tasks assessed                         SENSATION: WFL   EDEMA:  No observed swelling on eval   POSTURE:  pes planus, neutral knees   PALPATION: TTP of the lateral R ankle along the joint line ant to posterior.   LOWER EXTREMITY ROM: WNLs and equal R to L Active ROM Right eval Left eval Rt 02/18/23  Hip flexion       Hip extension       Hip abduction       Hip adduction       Hip internal rotation       Hip external rotation       Knee flexion       Knee extension       Ankle dorsiflexion '10 10 17  '$ Ankle plantarflexion       Ankle inversion       Ankle eversion        (Blank rows = not tested)   LOWER EXTREMITY MMT:   MMT Right eval Left eval  Hip flexion TBA TBA  Hip extension TBA TBA  Hip abduction TBA TBA  Hip adduction TBA TBA  Hip internal rotation TBA TBA  Hip external rotation TBA TBA  Knee flexion TBA TBA  Knee extension TBA TBA  Ankle dorsiflexion 4+ 4+  Ankle plantarflexion Unable to SL PF Unable to SL PF  Ankle inversion 4+ 4+  Ankle eversion 4 P 4+                         P = provoked pain (Blank rows = not tested)   LOWER EXTREMITY SPECIAL TESTS:  NT   FUNCTIONAL TESTS:  5 times sit to stand: TBA 01/15/23= 13.3" 2 minute walk test: TBA 01/15/23=505' SLS:  R 3"; L 20+" 01/31/23: Rt SLS 30sec   GAIT: Distance walked: 222f Assistive device utilized: None Level of assistance: Complete Independence Comments: walks with out toeing c pronation    [facility]                                                DATE: 02/20/23 Therapeutic Exercise: Nustep L5 6 minutes UE/LE Slant board stretch R ankle ev 3x15 RTB S/L R hip abd 3x10 5# S/L R hip clam 3x10 BluTB STS c 20lbs 2x10 Therapeutic Activity: Rocker board DF/PF and Inv/ev SL balance c  swiss ball ABCs Modalities: Ice pack to right ankle x 10 min  [facility] Adult PT Treatment:                                                DATE: 02/18/23 Therapeutic Exercise: Rec Bike 5 mins L4 Gastroc and soleus stretches bilateral with min cues  Ankle everter stretch c strap Alt step bosu presses 2x10 each Standing PF with towel  2x10 Hip abd cybex 2x10 17.5# Leg press cybex 2x10 Therapeutic Activity: FOTO completed and results reviewed Modalities: Ice pack to right ankle x 10 min  [facility] Adult PT Treatment:                                                DATE: 02/13/23 Therapeutic Exercise: Rec Bike 4 mins L6 Gastroc and soleus stretches bilateral with min cues  Ankle everter stretch c strap Ankle evertion c RTB 3x10 Standing PF with towel  x10 Lateral step ups on airex 2x10 R SL balance on airex Manual Therapy: STM and DTM to the lateral R calf c focus on the peroneal muscles Skilled palpation to identify taut muscle bands and TrPs  Trigger Point Dry Needling Treatment: Pre-treatment instruction: Patient instructed on dry needling rationale, procedures, and possible side effects including pain during treatment (achy,cramping feeling), bruising, drop of blood, lightheadedness, nausea, sweating. Patient Consent Given: No Education handout provided: Yes Muscles treated: Peroneal longus and brevis  Needle size and number:  20mx.30 1 Electrical stimulation performed: No Parameters:  NA Treatment response/outcome: Twitch response elicited Post-treatment instructions: Patient instructed to expect possible mild to moderate muscle soreness later today and/or tomorrow. Patient instructed in methods to reduce muscle soreness and to continue prescribed HEP. If patient was dry needled over the lung field, patient was instructed on signs and symptoms of pneumothorax and, however unlikely, to see immediate medical attention should they occur. Patient was also educated on signs and symptoms of  infection and to seek medical attention should they occur. Patient verbalized understanding of these instructions and education.      PATIENT EDUCATION:  Education details: Eval findings, POC, HEP, self care Person educated: Patient Education method: Explanation, Demonstration, Tactile cues, Verbal cues, and Handouts Education comprehension: verbalized understanding, returned demonstration, verbal cues required, and tactile cues required   HOME EXERCISE PROGRAM: Access Code: [email or web address]: [email or web address] Date: 01/15/2023 Prepared by: AGar Ponto Exercises - Gastroc Stretch on Wall  - 1 x daily - 7 x weekly - 1 sets - 3 reps - 30 hold - Soleus Stretch on Wall  - 1 x daily - 7 x weekly - 1 sets - 3 reps - 30 hold - Long Sitting Ankle Plantar Flexion with Resistance (Mirrored)  - 1 x daily - 7 x weekly - 2 sets - 10 reps - 3 hold - Long Sitting Ankle Eversion with Resistance (Mirrored)  - 1 x daily - 7 x weekly - 2 sets - 10 reps - 3 hold - Long Sitting Ankle Inversion with Resistance  - 1 x daily - 7 x weekly - 2 sets - 10 reps - 3 hold - Seated Ankle Dorsiflexion with Resistance  - 1 x daily - 7 x weekly - 2 sets - 10 reps - 3 hold - Standing Single Leg Stance with Counter Support  -  1 x daily - 7 x weekly - 3 sets - 5 reps - 30 hold Added 01/31/23 - Sit to stand with control  - 1 x daily - 7 x weekly - 2 sets - 10 reps   ASSESSMENT:   CLINICAL IMPRESSION: PT was completed for for R ankle and hip isotonic strengthening and for balance. SLS c swiss ball ABCs was challenging. Pt reports improved ease of function with up/down steps at home. Pt continues to make appropriate progress. Pt will continue to benefit from skilled PT to address impairments for improved function with less pain. Reassess 5xSTS and 2MWT.  OBJECTIVE IMPAIRMENTS: decreased activity tolerance, decreased balance, difficulty walking, decreased strength, obesity, and pain.    ACTIVITY LIMITATIONS:  carrying, lifting, bending, standing, squatting, stairs, locomotion level, and caring for others   PARTICIPATION LIMITATIONS: meal prep, cleaning, laundry, shopping, and community activity   PERSONAL FACTORS: Fitness, Past/current experiences, Time since onset of injury/illness/exacerbation, and 1 comorbidity: high BMI  are also affecting patient's functional outcome.    REHAB POTENTIAL: Fair chronicity of issue   CLINICAL DECISION MAKING: Evolving/moderate complexity   EVALUATION COMPLEXITY: Moderate     GOALS:   SHORT TERM GOALS: Target date: 01/24/23   Pt will be Ind in an initial HEP Baseline: Initiated 01/31/23: min Goal status: MET   2.  Pt will voice understanding of measures to assist in pain reduction Baseline: Initiated 01/31/23:still experiencing 9/10 pain by end of day. 02/18/23=uses cold packs as needed Goal status: MET   LONG TERM GOALS: Target date: 02/28/23   Pt will be Ind in a final HEP to maintain achieved LOF Baseline:  initiated Goal status: INITIAL   2.  Pt will be able to complete 15 reps for 2 sets of bilat PF for improved function Baseline:  01/31/23:  2 sets of 15 - no increased pain  Goal status: MET   3.  Pt will be able to single leg stan R for 15 sec as indication of improved ankle stability Baseline: 3" 01/31/23: 30 sec  Goal status: MET   4.  Improve 5xSTS by MCID of 5" and 2MWT by MCID of 65f as indication of improved functional mobility  Baseline: completed 01/15/23 Goal status: INITIAL   5.  Pt will report a decrease in R lateral ankle pain to 4/10 or less with daily activities for improved QOL Baseline: 4-10/10 Goal status: INITIAL     PLAN:   PT FREQUENCY: 2x/week   PT DURATION: 6 weeks   PLANNED INTERVENTIONS: Therapeutic exercises, Therapeutic activity, Balance training, Gait training, Patient/Family education, Self Care, Joint mobilization, Stair training, Aquatic Therapy, Dry Needling, Electrical stimulation, Cryotherapy, Moist  heat, Taping, Vasopneumatic device, Ultrasound, Ionotophoresis '4mg'$ /ml Dexamethasone, Manual therapy, and Re-evaluation   PLAN FOR NEXT SESSION: ; assess hip and knee strength; assess response to HEP; progress therex as indicated; use of modalities, manual therapy; and TPDN as indicated.   Keiston Manley MS, PT 02/21/23 3:19 PM

## 2023-02-20 ENCOUNTER — Ambulatory Visit: Payer: 59

## 2023-02-20 DIAGNOSIS — M25571 Pain in right ankle and joints of right foot: Secondary | ICD-10-CM | POA: Diagnosis not present

## 2023-02-20 DIAGNOSIS — R262 Difficulty in walking, not elsewhere classified: Secondary | ICD-10-CM

## 2023-02-20 DIAGNOSIS — M6281 Muscle weakness (generalized): Secondary | ICD-10-CM

## 2023-02-20 DIAGNOSIS — G8929 Other chronic pain: Secondary | ICD-10-CM

## 2023-02-25 NOTE — Therapy (Signed)
OUTPATIENT PHYSICAL THERAPY TREATMENT NOTE/Progress Note   Patient Name: Jasmine Jackson MRN: [id number] DOB:[birth year], 52 y.o., female Today's Date: 02/21/2023  PCP: Iona Coach, MD  REFERRING PROVIDER: Criselda Peaches, DPM   Progress Note Reporting Period 01/07/23 to 02/20/23  See note below for Objective Data and Assessment of Progress/Goals.      END OF SESSION:   PT End of Session - 02/20/23 1558     Visit Number 10    Number of Visits 13    Date for PT Re-Evaluation 02/28/23    Authorization Type [employer] DUAL COMPLETE; MEDICAID [place] ACCESS    Progress Note Due on Visit 10    PT Start Time 1550    PT Stop Time 1640    PT Time Calculation (min) 50 min    Activity Tolerance Patient tolerated treatment well    Behavior During Therapy WFL for tasks assessed/performed               Past Medical History:  Diagnosis Date   Anemia    06/19/21-pt has no recollection of this dx   Anxiety    Anxiety and depression    Asthma    Bipolar 1 disorder ([place])    Depression    Excessive daytime sleepiness 11/18/2017   GERD (gastroesophageal reflux disease)    Heart murmur    Hemorrhoids    Hiatal hernia    Hypertension    Internal hemorrhoids    Irritable bowel disease 2014   Toe injury 11/18/2017   Past Surgical History:  Procedure Laterality Date   COLONOSCOPY  2021   HEMORRHOID SURGERY     INGUINAL HERNIA REPAIR     PARTIAL HYSTERECTOMY     fibroids   TONSILLECTOMY     UPPER GASTROINTESTINAL ENDOSCOPY     Patient Active Problem List   Diagnosis Date Noted   Sciatica without back pain 07/19/2022   Rectal bleeding 08/22/2021   Encounter for medication management 07/05/2021   Bipolar disorder in full remission ([place]) 04/18/2021   Anxiety 09/21/2020   PTSD (post-traumatic stress disorder) 09/21/2020   Weight loss 07/31/2020   Acanthosis nigricans 11/16/2019   Nocturnal leg cramps 10/18/2018   Hot flashes 11/27/2016   Tobacco use  10/18/2016    Panic attacks 09/09/2016   Health care maintenance 01/11/2016   Hypertension 04/24/2015   Back muscle spasm 04/24/2015   Bipolar disorder ([place]) 04/16/2014   History of gout 04/01/2014   Prolapsed internal hemorrhoids, grade 3 12/08/2013   Irritable bowel syndrome 12/08/2013   Dyspepsia 12/08/2013    REFERRING DIAG: [id number] (ICD-10-CM) - Tear of peroneal tendon, right, initial encounter    THERAPY DIAG:  Chronic pain of right ankle  Muscle weakness (generalized)  Difficulty in walking, not elsewhere classified  Rationale for Evaluation and Treatment Rehabilitation  SUBJECTIVE:    SUBJECTIVE STATEMENT:  My R ankle feels pretty good today. I'm able to go up/down 13 steps 4x a day with a lot less difficulty.   PERTINENT HISTORY: Anxiety and depression; Bipolar 1; HTN PAIN:  Are you having pain? Yes: NPRS scale: 3/10 Pain location: lateral R ankle Pain description: heat, sharp, throb Aggravating factors: prolonged standing and walking Relieving factors: Rest Pain range on eval 4-10/10 Pain range 02/04/23 4-8/10 Pain range 02/18/23 3-7/10   PRECAUTIONS: None   WEIGHT BEARING RESTRICTIONS: No   FALLS:  Has patient fallen in last 6 months? No   LIVING ENVIRONMENT: Lives with: lives with their family  Pt reports no issue with accessing or mobility within home.   OCCUPATION: On disability   PLOF: Independent   PATIENT GOALS: Less pain and to tolerate being on her feet more   NEXT MD VISIT:    OBJECTIVE: (objective measures completed at initial evaluation unless otherwise dated)   DIAGNOSTIC FINDINGS: MRI 04/30/2022 IMPRESSION:: IMPRESSION: 1. 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 2. Mild degenerative spurring at the distal aspect of the medial malleolus. 3. Mild-to-moderate degenerative changes of the posterior aspect of the posterior subtalar joint. 4. Mild talonavicular and second  tarsometatarsal osteoarthritis.   PATIENT SURVEYS:  FOTO: Perceived function   41%, predicted   58%     COGNITION: Overall cognitive status: Within functional limits for tasks assessed                         SENSATION: WFL   EDEMA:  No observed swelling on eval   POSTURE:  pes planus, neutral knees   PALPATION: TTP of the lateral R ankle along the joint line ant to posterior.   LOWER EXTREMITY ROM: WNLs and equal R to L Active ROM Right eval Left eval Rt 02/18/23  Hip flexion       Hip extension       Hip abduction       Hip adduction       Hip internal rotation       Hip external rotation       Knee flexion       Knee extension       Ankle dorsiflexion '10 10 17  '$ Ankle plantarflexion       Ankle inversion       Ankle eversion        (Blank rows = not tested)   LOWER EXTREMITY MMT:   MMT Right eval Left eval  Hip flexion TBA TBA  Hip extension TBA TBA  Hip abduction TBA TBA  Hip adduction TBA TBA  Hip internal rotation TBA TBA  Hip external rotation TBA TBA  Knee flexion TBA TBA  Knee extension TBA TBA  Ankle dorsiflexion 4+ 4+  Ankle plantarflexion Unable to SL PF Unable to SL PF  Ankle inversion 4+ 4+  Ankle eversion 4 P 4+                         P = provoked pain (Blank rows = not tested)   LOWER EXTREMITY SPECIAL TESTS:  NT   FUNCTIONAL TESTS:  5 times sit to stand: TBA 01/15/23= 13.3" 2 minute walk test: TBA 01/15/23=505' SLS:  R 3"; L 20+" 01/31/23: Rt SLS 30sec   GAIT: Distance walked: 286f Assistive device utilized: None Level of assistance: Complete Independence Comments: walks with out toeing c pronation    [facility]                                                [facility]: [facility] Therapeutic Exercise: *** Manual Therapy: *** Neuromuscular re-ed: *** Therapeutic Activity: *** Modalities: *** Self Care: *[facility] FessAdult PT Treatment:  DATE:  02/20/23 Therapeutic Exercise: Nustep L5 6 minutes UE/LE Slant board stretch R ankle ev 3x15 RTB S/L R hip abd 3x10 5# S/L R hip clam 3x10 BluTB STS c 20lbs 2x10 Therapeutic Activity: Rocker board DF/PF and Inv/ev SL balance c swiss ball ABCs Modalities: Ice pack to right ankle x 10 min  [facility] Adult PT Treatment:                                                DATE: 02/18/23 Therapeutic Exercise: Rec Bike 5 mins L4 Gastroc and soleus stretches bilateral with min cues  Ankle everter stretch c strap Alt step bosu presses 2x10 each Standing PF with towel  2x10 Hip abd cybex 2x10 17.5# Leg press cybex 2x10 Therapeutic Activity: FOTO completed and results reviewed Modalities: Ice pack to right ankle x 10 min  [facility] Adult PT Treatment:                                                DATE: 02/13/23 Therapeutic Exercise: Rec Bike 4 mins L6 Gastroc and soleus stretches bilateral with min cues  Ankle everter stretch c strap Ankle evertion c RTB 3x10 Standing PF with towel  x10 Lateral step ups on airex 2x10 R SL balance on airex Manual Therapy: STM and DTM to the lateral R calf c focus on the peroneal muscles Skilled palpation to identify taut muscle bands and TrPs  Trigger Point Dry Needling Treatment: Pre-treatment instruction: Patient instructed on dry needling rationale, procedures, and possible side effects including pain during treatment (achy,cramping feeling), bruising, drop of blood, lightheadedness, nausea, sweating. Patient Consent Given: No Education handout provided: Yes Muscles treated: Peroneal longus and brevis  Needle size and number:  8mx.30 1 Electrical stimulation performed: No Parameters:  NA Treatment response/outcome: Twitch response elicited Post-treatment instructions: Patient instructed to expect possible mild to moderate muscle soreness later today and/or tomorrow. Patient instructed in methods to reduce muscle soreness and to continue prescribed HEP. If  patient was dry needled over the lung field, patient was instructed on signs and symptoms of pneumothorax and, however unlikely, to see immediate medical attention should they occur. Patient was also educated on signs and symptoms of infection and to seek medical attention should they occur. Patient verbalized understanding of these instructions and education.      PATIENT EDUCATION:  Education details: Eval findings, POC, HEP, self care Person educated: Patient Education method: Explanation, Demonstration, Tactile cues, Verbal cues, and Handouts Education comprehension: verbalized understanding, returned demonstration, verbal cues required, and tactile cues required   HOME EXERCISE PROGRAM: Access Code: [email or web address]: [email or web address] Date: 01/15/2023 Prepared by: AGar Ponto Exercises - Gastroc Stretch on Wall  - 1 x daily - 7 x weekly - 1 sets - 3 reps - 30 hold - Soleus Stretch on Wall  - 1 x daily - 7 x weekly - 1 sets - 3 reps - 30 hold - Long Sitting Ankle Plantar Flexion with Resistance (Mirrored)  - 1 x daily - 7 x weekly - 2 sets - 10 reps - 3 hold - Long Sitting Ankle Eversion with Resistance (Mirrored)  - 1 x daily - 7 x weekly - 2 sets - 10 reps - 3 hold -  Long Sitting Ankle Inversion with Resistance  - 1 x daily - 7 x weekly - 2 sets - 10 reps - 3 hold - Seated Ankle Dorsiflexion with Resistance  - 1 x daily - 7 x weekly - 2 sets - 10 reps - 3 hold - Standing Single Leg Stance with Counter Support  - 1 x daily - 7 x weekly - 3 sets - 5 reps - 30 hold Added 01/31/23 - Sit to stand with control  - 1 x daily - 7 x weekly - 2 sets - 10 reps   ASSESSMENT:   CLINICAL IMPRESSION: PT was completed for for R ankle and hip isotonic strengthening and for balance. SLS c swiss ball ABCs was challenging. Pt reports improved ease of function with up/down steps at home. Pt continues to make appropriate progress. Pt will continue to benefit from skilled PT to address  impairments for improved function with less pain. Reassess 5xSTS and 2MWT.  OBJECTIVE IMPAIRMENTS: decreased activity tolerance, decreased balance, difficulty walking, decreased strength, obesity, and pain.    ACTIVITY LIMITATIONS: carrying, lifting, bending, standing, squatting, stairs, locomotion level, and caring for others   PARTICIPATION LIMITATIONS: meal prep, cleaning, laundry, shopping, and community activity   PERSONAL FACTORS: Fitness, Past/current experiences, Time since onset of injury/illness/exacerbation, and 1 comorbidity: high BMI  are also affecting patient's functional outcome.    REHAB POTENTIAL: Fair chronicity of issue   CLINICAL DECISION MAKING: Evolving/moderate complexity   EVALUATION COMPLEXITY: Moderate     GOALS:   SHORT TERM GOALS: Target date: 01/24/23   Pt will be Ind in an initial HEP Baseline: Initiated 01/31/23: min Goal status: MET   2.  Pt will voice understanding of measures to assist in pain reduction Baseline: Initiated 01/31/23:still experiencing 9/10 pain by end of day. 02/18/23=uses cold packs as needed Goal status: MET   LONG TERM GOALS: Target date: 02/28/23   Pt will be Ind in a final HEP to maintain achieved LOF Baseline:  initiated Goal status: INITIAL   2.  Pt will be able to complete 15 reps for 2 sets of bilat PF for improved function Baseline:  01/31/23:  2 sets of 15 - no increased pain  Goal status: MET   3.  Pt will be able to single leg stan R for 15 sec as indication of improved ankle stability Baseline: 3" 01/31/23: 30 sec  Goal status: MET   4.  Improve 5xSTS by MCID of 5" and 2MWT by MCID of 49f as indication of improved functional mobility  Baseline: completed 01/15/23 Goal status: INITIAL   5.  Pt will report a decrease in R lateral ankle pain to 4/10 or less with daily activities for improved QOL Baseline: 4-10/10 Goal status: INITIAL     PLAN:   PT FREQUENCY: 2x/week   PT DURATION: 6 weeks   PLANNED  INTERVENTIONS: Therapeutic exercises, Therapeutic activity, Balance training, Gait training, Patient/Family education, Self Care, Joint mobilization, Stair training, Aquatic Therapy, Dry Needling, Electrical stimulation, Cryotherapy, Moist heat, Taping, Vasopneumatic device, Ultrasound, Ionotophoresis '4mg'$ /ml Dexamethasone, Manual therapy, and Re-evaluation   PLAN FOR NEXT SESSION: ; assess hip and knee strength; assess response to HEP; progress therex as indicated; use of modalities, manual therapy; and TPDN as indicated.   Ronav Furney MS, PT 02/21/23 3:19 PM

## 2023-02-26 ENCOUNTER — Ambulatory Visit: Payer: 59 | Attending: Podiatry

## 2023-02-26 DIAGNOSIS — M6281 Muscle weakness (generalized): Secondary | ICD-10-CM | POA: Insufficient documentation

## 2023-02-26 DIAGNOSIS — R262 Difficulty in walking, not elsewhere classified: Secondary | ICD-10-CM | POA: Insufficient documentation

## 2023-02-26 DIAGNOSIS — M25571 Pain in right ankle and joints of right foot: Secondary | ICD-10-CM | POA: Diagnosis present

## 2023-02-26 DIAGNOSIS — G8929 Other chronic pain: Secondary | ICD-10-CM | POA: Insufficient documentation

## 2023-02-27 ENCOUNTER — Ambulatory Visit: Payer: 59 | Admitting: Podiatry

## 2023-02-28 ENCOUNTER — Telehealth (INDEPENDENT_AMBULATORY_CARE_PROVIDER_SITE_OTHER): Payer: 59 | Admitting: Student in an Organized Health Care Education/Training Program

## 2023-02-28 ENCOUNTER — Telehealth (HOSPITAL_COMMUNITY): Payer: Self-pay | Admitting: Student in an Organized Health Care Education/Training Program

## 2023-02-28 ENCOUNTER — Encounter (HOSPITAL_COMMUNITY): Payer: Self-pay | Admitting: Student in an Organized Health Care Education/Training Program

## 2023-02-28 DIAGNOSIS — F3170 Bipolar disorder, currently in remission, most recent episode unspecified: Secondary | ICD-10-CM | POA: Diagnosis not present

## 2023-02-28 DIAGNOSIS — F419 Anxiety disorder, unspecified: Secondary | ICD-10-CM

## 2023-02-28 DIAGNOSIS — G4700 Insomnia, unspecified: Secondary | ICD-10-CM | POA: Diagnosis not present

## 2023-02-28 MED ORDER — TRAZODONE HCL 50 MG PO TABS: 50.0000 mg | ORAL_TABLET | Freq: Every evening | ORAL | 2 refills | Status: DC | PRN

## 2023-02-28 MED ORDER — LAMOTRIGINE 100 MG PO TABS: 100.0000 mg | ORAL_TABLET | Freq: Every day | ORAL | 3 refills | Status: DC

## 2023-02-28 MED ORDER — QUETIAPINE FUMARATE ER 400 MG PO TB24: 400.0000 mg | ORAL_TABLET | Freq: Every day | ORAL | 3 refills | Status: DC

## 2023-02-28 MED ORDER — MIRTAZAPINE 30 MG PO TBDP: ORAL_TABLET | ORAL | 3 refills | Status: DC

## 2023-02-28 MED ORDER — QUETIAPINE FUMARATE 100 MG PO TABS: 100.0000 mg | ORAL_TABLET | Freq: Every day | ORAL | 1 refills | Status: DC

## 2023-02-28 MED ORDER — GABAPENTIN 400 MG PO CAPS: 400.0000 mg | ORAL_CAPSULE | Freq: Three times a day (TID) | ORAL | 3 refills | Status: DC
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
## 2023-03-10 ENCOUNTER — Ambulatory Visit: Payer: 59 | Admitting: Physical Therapy

## 2023-03-10 ENCOUNTER — Encounter: Payer: Self-pay | Admitting: Physical Therapy

## 2023-03-10 DIAGNOSIS — M6281 Muscle weakness (generalized): Secondary | ICD-10-CM

## 2023-03-10 DIAGNOSIS — R262 Difficulty in walking, not elsewhere classified: Secondary | ICD-10-CM

## 2023-03-10 DIAGNOSIS — M25571 Pain in right ankle and joints of right foot: Secondary | ICD-10-CM | POA: Diagnosis not present

## 2023-03-10 DIAGNOSIS — G8929 Other chronic pain: Secondary | ICD-10-CM

## 2023-03-10 NOTE — Therapy (Signed)
OUTPATIENT PHYSICAL THERAPY TREATMENT NOTE   Patient Name: Jasmine Jackson MRN: [id number] DOB:12-15-1971, 52 y.o., female Today's Date: 03/10/2023  PCP: Iona Coach, MD  REFERRING PROVIDER: Criselda Peaches, DPM       END OF SESSION:   PT End of Session - 03/10/23 1458     Visit Number 12    Number of Visits 13    Date for PT Re-Evaluation 04/02/23    Authorization Type [employer] DUAL COMPLETE; [place]    PT Start Time 1455    PT Stop Time 1530    PT Time Calculation (min) 35 min               Past Medical History:  Diagnosis Date   Anemia    06/19/21-pt has no recollection of this dx   Anxiety    Anxiety and depression    Asthma    Bipolar 1 disorder ([place])    Depression    Excessive daytime sleepiness 11/18/2017   GERD (gastroesophageal reflux disease)    Heart murmur    Hemorrhoids    Hiatal hernia    Hypertension    Internal hemorrhoids    Irritable bowel disease 2014   Toe injury 11/18/2017   Past Surgical History:  Procedure Laterality Date   COLONOSCOPY  2021   HEMORRHOID SURGERY     INGUINAL HERNIA REPAIR     PARTIAL HYSTERECTOMY     fibroids   TONSILLECTOMY     UPPER GASTROINTESTINAL ENDOSCOPY     Patient Active Problem List   Diagnosis Date Noted   Sciatica without back pain 07/19/2022   Rectal bleeding 08/22/2021   Encounter for medication management 07/05/2021   Bipolar disorder in full remission ([place]) 04/18/2021   Anxiety 09/21/2020   PTSD (post-traumatic stress disorder) 09/21/2020   Weight loss 07/31/2020   Acanthosis nigricans 11/16/2019   Nocturnal leg cramps 10/18/2018   Hot flashes 11/27/2016   Tobacco use  10/18/2016   Panic attacks 09/09/2016   Health care maintenance 01/11/2016   Hypertension 04/24/2015   Back muscle spasm 04/24/2015   Bipolar disorder ([place]) 04/16/2014   History of gout 04/01/2014   Prolapsed internal hemorrhoids, grade 3 12/08/2013   Irritable bowel syndrome 12/08/2013    Dyspepsia 12/08/2013    REFERRING DIAG: [id number] (ICD-10-CM) - Tear of peroneal tendon, right, initial encounter    THERAPY DIAG:  Chronic pain of right ankle  Muscle weakness (generalized)  Difficulty in walking, not elsewhere classified  Rationale for Evaluation and Treatment Rehabilitation  SUBJECTIVE:    SUBJECTIVE STATEMENT:  My R ankle is only 2/10. I strained my back lifting a stepper machine last night. I need to order some new shoes.       PERTINENT HISTORY: Anxiety and depression; Bipolar 1; HTN PAIN:  Are you having pain? Yes: NPRS scale: 2/10 Pain location: lateral R ankle Pain description: heat, sharp, throb Aggravating factors: prolonged standing and walking Relieving factors: Rest Pain range on eval 4-10/10 Pain range 02/04/23 4-8/10 Pain range 02/18/23 3-7/10   PRECAUTIONS: None   WEIGHT BEARING RESTRICTIONS: No   FALLS:  Has patient fallen in last 6 months? No   LIVING ENVIRONMENT: Lives with: lives with their family            Pt reports no issue with accessing or mobility within home.   OCCUPATION: On disability   PLOF: Independent   PATIENT GOALS: Less pain and to tolerate being on her feet more   NEXT MD  VISIT:    OBJECTIVE: (objective measures completed at initial evaluation unless otherwise dated)     DIAGNOSTIC FINDINGS: MRI 04/30/2022 IMPRESSION:: IMPRESSION: 1. 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 2. Mild degenerative spurring at the distal aspect of the medial malleolus. 3. Mild-to-moderate degenerative changes of the posterior aspect of the posterior subtalar joint. 4. Mild talonavicular and second tarsometatarsal osteoarthritis.    PATIENT SURVEYS:  FOTO: Perceived function   41%, predicted   58%     COGNITION: Overall cognitive status: Within functional limits for tasks assessed                         SENSATION: WFL   EDEMA:  No observed swelling on eval    POSTURE:  pes planus, neutral knees   PALPATION: TTP of the lateral R ankle along the joint line ant to posterior.   LOWER EXTREMITY ROM: WNLs and equal R to L Active ROM Right eval Left eval Rt 02/18/23  Hip flexion       Hip extension       Hip abduction       Hip adduction       Hip internal rotation       Hip external rotation       Knee flexion       Knee extension       Ankle dorsiflexion 10 10 17   Ankle plantarflexion       Ankle inversion       Ankle eversion        (Blank rows = not tested)   LOWER EXTREMITY MMT:   MMT Right eval Left eval  Hip flexion TBA TBA  Hip extension TBA TBA  Hip abduction TBA TBA  Hip adduction TBA TBA  Hip internal rotation TBA TBA  Hip external rotation TBA TBA  Knee flexion TBA TBA  Knee extension TBA TBA  Ankle dorsiflexion 4+ 4+  Ankle plantarflexion Unable to SL PF Unable to SL PF  Ankle inversion 4+ 4+  Ankle eversion 4 P 4+                         P = provoked pain (Blank rows = not tested)   LOWER EXTREMITY SPECIAL TESTS:  NT   FUNCTIONAL TESTS:  5 times sit to stand: TBA 01/15/23= 13.3" 2 minute walk test: TBA 01/15/23=505' SLS:  R 3"; L 20+" 01/31/23: Rt SLS 30sec; 02/26/23; 5xSTS=9.2 ", 2MWT=572ft    GAIT: Distance walked: 239ft Assistive device utilized: None Level of assistance: Complete Independence Comments: walks with out toeing c pronation    TODAY'S TREATMENT: [facility] Adult PT Treatment:                                                DATE: 03/10/23 Therapeutic Exercise: Nustep L6 x 5 minutes  Green banded side stepping  Heel raises  Squat with heel raise  Gastroc and soleus stretches Tandem stance  4 inch step down x 12 4 inch retro step up x 10     [facility] Adult PT Treatment:  DATE: 02/26/23 Therapeutic Exercise: Nustep L5 6 minutes UE/LE Slant board stretch x2 30" Banded side steps GTB above ankles 20x2x3 Lateral  step ups 4" to airex 2x15  bilat Forward  step ups 4" to airex 2x15 bilat Therapeutic Activity: 5xSTS 2 Min walking test Tandem ABC c swiss ball each foot forward  [facility] Adult PT Treatment:                                                DATE: 02/20/23 Therapeutic Exercise: Nustep L5 6 minutes UE/LE Slant board stretch R ankle ev 3x15 RTB S/L R hip abd 3x10 5# S/L R hip clam 3x10 BluTB STS c 20lbs 2x10 Therapeutic Activity: Rocker board DF/PF and Inv/ev SL balance c swiss ball ABCs Modalities: Ice pack to right ankle x 10 min      PATIENT EDUCATION:  Education details: Eval findings, POC, HEP, self care Person educated: Patient Education method: Explanation, Demonstration, Tactile cues, Verbal cues, and Handouts Education comprehension: verbalized understanding, returned demonstration, verbal cues required, and tactile cues required   HOME EXERCISE PROGRAM: Access Code: [email or web address] URL: [email or web address] Date: 01/15/2023 Prepared by: Gar Ponto  Exercises - Gastroc Stretch on Wall  - 1 x daily - 7 x weekly - 1 sets - 3 reps - 30 hold - Soleus Stretch on Wall  - 1 x daily - 7 x weekly - 1 sets - 3 reps - 30 hold - Long Sitting Ankle Plantar Flexion with Resistance (Mirrored)  - 1 x daily - 7 x weekly - 2 sets - 10 reps - 3 hold - Long Sitting Ankle Eversion with Resistance (Mirrored)  - 1 x daily - 7 x weekly - 2 sets - 10 reps - 3 hold - Long Sitting Ankle Inversion with Resistance  - 1 x daily - 7 x weekly - 2 sets - 10 reps - 3 hold - Seated Ankle Dorsiflexion with Resistance  - 1 x daily - 7 x weekly - 2 sets - 10 reps - 3 hold - Standing Single Leg Stance with Counter Support  - 1 x daily - 7 x weekly - 3 sets - 5 reps - 30 hold Added 01/31/23 - Sit to stand with control  - 1 x daily - 7 x weekly - 2 sets - 10 reps   ASSESSMENT:   CLINICAL IMPRESSION: Pt arrives with low level ankle pain and acute back pain from heavy lifting yesterday. PT was completed for balance and stability  of the legs/R ankle. .Pt will benefit from future session of body mechanics education to prevent injury.  Pt will continue to benefit from skilled PT  to assist further with pain reduction to improve activity tolerance.  OBJECTIVE IMPAIRMENTS: decreased activity tolerance, decreased balance, difficulty walking, decreased strength, obesity, and pain.    ACTIVITY LIMITATIONS: carrying, lifting, bending, standing, squatting, stairs, locomotion level, and caring for others   PARTICIPATION LIMITATIONS: meal prep, cleaning, laundry, shopping, and community activity   PERSONAL FACTORS: Fitness, Past/current experiences, Time since onset of injury/illness/exacerbation, and 1 comorbidity: high BMI  are also affecting patient's functional outcome.    REHAB POTENTIAL: Fair chronicity of issue   CLINICAL DECISION MAKING: Evolving/moderate complexity   EVALUATION COMPLEXITY: Moderate     GOALS:   SHORT TERM GOALS: Target date: 01/24/23   Pt will be Ind in an initial HEP Baseline: Initiated  01/31/23: min Goal status: MET   2.  Pt will voice understanding of measures to assist in pain reduction Baseline: Initiated 01/31/23:still experiencing 9/10 pain by end of day. 02/18/23=uses cold packs as needed Goal status: MET   LONG TERM GOALS: Target date: 04/02/23   Pt will be Ind in a final HEP to maintain achieved LOF Baseline:  initiated Goal status: INITIAL   2.  Pt will be able to complete 15 reps for 2 sets of bilat PF for improved function Baseline:  01/31/23:  2 sets of 15 - no increased pain  Goal status: MET   3.  Pt will be able to single leg stan R for 15 sec as indication of improved ankle stability Baseline: 3" 01/31/23: 30 sec  Goal status: MET   4.  Improve 5xSTS by MCID of 5" and 2MWT by MCID of 87ft as indication of improved functional mobility  Baseline: completed 01/15/23 Status: 02/26/23; 5xSTS=9.2 ", 2MWT=55ft Goal status: MET   5.  Pt will report a decrease in R lateral ankle  pain to 4/10 or less with daily activities for improved QOL Baseline: 4-10/10 Status: 2-7/10 Goal status: Improved  6. Pt wil report improved R lateral ankle pain with prolonged standing and walking to 4/10 or less for improved QOL  Baseline: 10/10 Status: 7/10 Goal status: Improved   PLAN:   PT FREQUENCY: 1x/week   PT DURATION: 4 weeks   PLANNED INTERVENTIONS: Therapeutic exercises, Therapeutic activity, Balance training, Gait training, Patient/Family education, Self Care, Joint mobilization, Stair training, Aquatic Therapy, Dry Needling, Electrical stimulation, Cryotherapy, Moist heat, Taping, Vasopneumatic device, Ultrasound, Ionotophoresis 4mg /ml Dexamethasone, Manual therapy, and Re-evaluation   PLAN FOR NEXT SESSION: ; body mechanics to prevent more back strain,assess hip and knee strength; assess response to HEP; progress therex as indicated; use of modalities, manual therapy; and TPDN as indicated.   Hessie Diener, PTA 03/10/23 3:44 PM Phone: [phone] Fax: [phone]

## 2023-03-18 ENCOUNTER — Ambulatory Visit: Payer: 59

## 2023-03-18 DIAGNOSIS — M6281 Muscle weakness (generalized): Secondary | ICD-10-CM

## 2023-03-18 DIAGNOSIS — R262 Difficulty in walking, not elsewhere classified: Secondary | ICD-10-CM

## 2023-03-18 DIAGNOSIS — M25571 Pain in right ankle and joints of right foot: Secondary | ICD-10-CM | POA: Diagnosis not present

## 2023-03-18 DIAGNOSIS — G8929 Other chronic pain: Secondary | ICD-10-CM

## 2023-03-18 NOTE — Therapy (Signed)
OUTPATIENT PHYSICAL THERAPY TREATMENT NOTE   Patient Name: Jasmine Jackson MRN: [id number] DOB:19-Oct-1971, 52 y.o., female Today's Date: 03/18/2023  PCP: Iona Coach, MD  REFERRING PROVIDER: Criselda Peaches, DPM   END OF SESSION:   PT End of Session - 03/18/23 1505     Visit Number 13    Number of Visits 13    Date for PT Re-Evaluation 04/02/23    Authorization Type [employer] DUAL COMPLETE; MEDICAID [place] ACCESS    Progress Note Due on Visit 10    PT Start Time 1505    PT Stop Time 1545    PT Time Calculation (min) 40 min    Activity Tolerance Patient tolerated treatment well    Behavior During Therapy WFL for tasks assessed/performed               Past Medical History:  Diagnosis Date   Anemia    06/19/21-pt has no recollection of this dx   Anxiety    Anxiety and depression    Asthma    Bipolar 1 disorder ([place])    Depression    Excessive daytime sleepiness 11/18/2017   GERD (gastroesophageal reflux disease)    Heart murmur    Hemorrhoids    Hiatal hernia    Hypertension    Internal hemorrhoids    Irritable bowel disease 2014   Toe injury 11/18/2017   Past Surgical History:  Procedure Laterality Date   COLONOSCOPY  2021   HEMORRHOID SURGERY     INGUINAL HERNIA REPAIR     PARTIAL HYSTERECTOMY     fibroids   TONSILLECTOMY     UPPER GASTROINTESTINAL ENDOSCOPY     Patient Active Problem List   Diagnosis Date Noted   Sciatica without back pain 07/19/2022   Rectal bleeding 08/22/2021   Encounter for medication management 07/05/2021   Bipolar disorder in full remission ([place]) 04/18/2021   Anxiety 09/21/2020   PTSD (post-traumatic stress disorder) 09/21/2020   Weight loss 07/31/2020   Acanthosis nigricans 11/16/2019   Nocturnal leg cramps 10/18/2018   Hot flashes 11/27/2016   Tobacco use  10/18/2016   Panic attacks 09/09/2016   Health care maintenance 01/11/2016   Hypertension 04/24/2015   Back muscle spasm 04/24/2015   Bipolar disorder  ([place]) 04/16/2014   History of gout 04/01/2014   Prolapsed internal hemorrhoids, grade 3 12/08/2013   Irritable bowel syndrome 12/08/2013   Dyspepsia 12/08/2013    REFERRING DIAG: [id number] (ICD-10-CM) - Tear of peroneal tendon, right, initial encounter    THERAPY DIAG:  Chronic pain of right ankle  Muscle weakness (generalized)  Difficulty in walking, not elsewhere classified  Rationale for Evaluation and Treatment Rehabilitation  SUBJECTIVE:    SUBJECTIVE STATEMENT:  Pt reports this weekend she danced practicing for play 1 hour 15 mins which aggravated her her R ankle. She states she use cold packs which decreased the pain significantly by the next day. Overall, pt reports she has continued to improved and is pleased with her progress.   PERTINENT HISTORY: Anxiety and depression; Bipolar 1; HTN PAIN:  Are you having pain? Yes: NPRS scale: 1/10 Pain location: lateral R ankle Pain description: heat, sharp, throb Aggravating factors: prolonged standing and walking Relieving factors: Rest Pain range on eval 4-10/10 Pain range 02/04/23 4-8/10 Pain range 02/18/23 3-7/10   PRECAUTIONS: None   WEIGHT BEARING RESTRICTIONS: No   FALLS:  Has patient fallen in last 6 months? No   LIVING ENVIRONMENT: Lives with: lives with their family  Pt reports no issue with accessing or mobility within home.   OCCUPATION: On disability   PLOF: Independent   PATIENT GOALS: Less pain and to tolerate being on her feet more   NEXT MD VISIT:    OBJECTIVE: (objective measures completed at initial evaluation unless otherwise dated)     DIAGNOSTIC FINDINGS: MRI 04/30/2022 IMPRESSION:: IMPRESSION: 1. 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 2. Mild degenerative spurring at the distal aspect of the medial malleolus. 3. Mild-to-moderate degenerative changes of the posterior aspect of the posterior subtalar joint. 4. Mild  talonavicular and second tarsometatarsal osteoarthritis.    PATIENT SURVEYS:  FOTO: Perceived function   41%, predicted   53% 02/18/23=58%    COGNITION: Overall cognitive status: Within functional limits for tasks assessed                         SENSATION: WFL   EDEMA:  No observed swelling on eval   POSTURE:  pes planus, neutral knees   PALPATION: TTP of the lateral R ankle along the joint line ant to posterior.   LOWER EXTREMITY ROM: WNLs and equal R to L Active ROM Right eval Left eval Rt 02/18/23  Hip flexion       Hip extension       Hip abduction       Hip adduction       Hip internal rotation       Hip external rotation       Knee flexion       Knee extension       Ankle dorsiflexion 10 10 17   Ankle plantarflexion       Ankle inversion       Ankle eversion        (Blank rows = not tested)   LOWER EXTREMITY MMT:   MMT Right eval Left eval  Hip flexion TBA TBA  Hip extension TBA TBA  Hip abduction TBA TBA  Hip adduction TBA TBA  Hip internal rotation TBA TBA  Hip external rotation TBA TBA  Knee flexion TBA TBA  Knee extension TBA TBA  Ankle dorsiflexion 4+ 4+  Ankle plantarflexion Unable to SL PF Unable to SL PF  Ankle inversion 4+ 4+  Ankle eversion 4 P 4+                         P = provoked pain (Blank rows = not tested)   LOWER EXTREMITY SPECIAL TESTS:  NT   FUNCTIONAL TESTS:  5 times sit to stand: TBA 01/15/23= 13.3" 2 minute walk test: TBA 01/15/23=505' SLS:  R 3"; L 20+" 01/31/23: Rt SLS 30sec; 02/26/23; 5xSTS=9.2 ", 2MWT=521ft  GAIT: Distance walked: 278ft Assistive device utilized: None Level of assistance: Complete Independence Comments: walks with out toeing c pronation    TODAY'S TREATMENT: [facility] Adult PT Treatment:                                                DATE: 03/18/23 Therapeutic Exercise: Nustep L6 x 5 minutes  Elliptical x3 Min Cybex bilat leg press 2x10 60#; SL press 2x10 20# Green banded side stepping 24ftx4 Heel  raises x20 Gastroc and soleus stretches Final HEP  [facility] Adult PT Treatment:  DATE: 03/10/23 Therapeutic Exercise: Nustep L6 x 5 minutes  Green banded side stepping  Heel raises  Squat with heel raise  Gastroc and soleus stretches Tandem stance  4 inch step down x 12 4 inch retro step up x 10    PATIENT EDUCATION:  Education details: Eval findings, POC, HEP, self care Person educated: Patient Education method: Explanation, Demonstration, Tactile cues, Verbal cues, and Handouts Education comprehension: verbalized understanding, returned demonstration, verbal cues required, and tactile cues required   HOME EXERCISE PROGRAM: Access Code: [email or web address] URL: [email or web address] Date: 03/18/2023 Prepared by: Gar Ponto  Exercises - Gastroc Stretch on Wall  - 1 x daily - 7 x weekly - 1 sets - 3 reps - 30 hold - Soleus Stretch on Wall  - 1 x daily - 7 x weekly - 1 sets - 3 reps - 30 hold - Long Sitting Ankle Plantar Flexion with Resistance (Mirrored)  - 1 x daily - 7 x weekly - 2 sets - 10 reps - 3 hold - Long Sitting Ankle Eversion with Resistance (Mirrored)  - 1 x daily - 7 x weekly - 2 sets - 10 reps - 3 hold - Long Sitting Ankle Inversion with Resistance  - 1 x daily - 7 x weekly - 2 sets - 10 reps - 3 hold - Seated Ankle Dorsiflexion with Resistance  - 1 x daily - 7 x weekly - 2 sets - 10 reps - 3 hold - Standing Single Leg Stance with Counter Support  - 1 x daily - 7 x weekly - 3 sets - 5 reps - 30 hold - Sit to stand with control  - 1 x daily - 7 x weekly - 2 sets - 10 reps - Clamshell with Resistance  - 1 x daily - 7 x weekly - 3 sets - 10 reps   ASSESSMENT:   CLINICAL IMPRESSION: Pt completed her last of day of PT today. The pt has made very good progress re: R ankle pain, ROM, strength and function meeting all PT goals. Pt is is Ind with a HEP to a assist with maintaining or improving her achieved LOF. Pt is Ind with  managing her R ankle pain as needed. Pt is in agreement with DC from PT at this time.  OBJECTIVE IMPAIRMENTS: decreased activity tolerance, decreased balance, difficulty walking, decreased strength, obesity, and pain.    ACTIVITY LIMITATIONS: carrying, lifting, bending, standing, squatting, stairs, locomotion level, and caring for others   PARTICIPATION LIMITATIONS: meal prep, cleaning, laundry, shopping, and community activity   PERSONAL FACTORS: Fitness, Past/current experiences, Time since onset of injury/illness/exacerbation, and 1 comorbidity: high BMI  are also affecting patient's functional outcome.    REHAB POTENTIAL: Fair chronicity of issue   CLINICAL DECISION MAKING: Evolving/moderate complexity   EVALUATION COMPLEXITY: Moderate     GOALS:   SHORT TERM GOALS: Target date: 01/24/23   Pt will be Ind in an initial HEP Baseline: Initiated 01/31/23: min Goal status: MET   2.  Pt will voice understanding of measures to assist in pain reduction Baseline: Initiated 01/31/23:still experiencing 9/10 pain by end of day. 02/18/23=uses cold packs as needed Goal status: MET   LONG TERM GOALS: Target date: 04/02/23   Pt will be Ind in a final HEP to maintain achieved LOF Baseline:  initiated Goal status: MET   2.  Pt will be able to complete 15 reps for 2 sets of bilat PF for improved function Baseline:  01/31/23:  2 sets of 15 -  no increased pain  Goal status: MET   3.  Pt will be able to single leg stan R for 15 sec as indication of improved ankle stability Baseline: 3" 01/31/23: 30 sec  Goal status: MET   4.  Improve 5xSTS by MCID of 5" and 2MWT by MCID of 67ft as indication of improved functional mobility  Baseline: completed 01/15/23 Status: 02/26/23; 5xSTS=9.2 ", 2MWT=542ft Goal status: MET   5.  Pt will report a decrease in R lateral ankle pain to 4/10 or less with daily activities for improved QOL Baseline: 4-10/10 Status: 2-7/10, 03/18/23:1-3/10 Goal status: MET  6. Pt  wil report improved R lateral ankle pain with prolonged standing and walking to 4/10 or less for improved QOL  Baseline: 10/10 Status: 7/10 03/18/23=3/10 Goal status: MET   PLAN:   PT FREQUENCY: 1x/week   PT DURATION: 4 weeks   PLANNED INTERVENTIONS: Therapeutic exercises, Therapeutic activity, Balance training, Gait training, Patient/Family education, Self Care, Joint mobilization, Stair training, Aquatic Therapy, Dry Needling, Electrical stimulation, Cryotherapy, Moist heat, Taping, Vasopneumatic device, Ultrasound, Ionotophoresis 4mg /ml Dexamethasone, Manual therapy, and Re-evaluation   PHYSICAL THERAPY DISCHARGE SUMMARY  Visits from Start of Care: 13  Current functional level related to goals / functional outcomes: See clinical impression and PT goals    Remaining deficits: See clinical impression and PT goals   Education / Equipment: HEP and Pt Ed   Patient agrees to discharge. Patient goals were met. Patient is being discharged due to being pleased with the current functional level.   Tomicka Lover MS,  03/18/23 6:22 PM

## 2023-03-20 ENCOUNTER — Other Ambulatory Visit: Payer: Self-pay | Admitting: Podiatry

## 2023-03-20 ENCOUNTER — Ambulatory Visit: Payer: 59 | Admitting: Podiatry

## 2023-03-21 ENCOUNTER — Other Ambulatory Visit (HOSPITAL_COMMUNITY): Payer: Self-pay | Admitting: Psychiatry

## 2023-03-21 DIAGNOSIS — F3170 Bipolar disorder, currently in remission, most recent episode unspecified: Secondary | ICD-10-CM

## 2023-03-26 ENCOUNTER — Ambulatory Visit: Payer: 59

## 2023-03-31 ENCOUNTER — Other Ambulatory Visit (HOSPITAL_COMMUNITY): Payer: 59

## 2023-04-02 ENCOUNTER — Other Ambulatory Visit (HOSPITAL_COMMUNITY): Payer: Self-pay | Admitting: Student in an Organized Health Care Education/Training Program

## 2023-04-02 ENCOUNTER — Ambulatory Visit (INDEPENDENT_AMBULATORY_CARE_PROVIDER_SITE_OTHER): Payer: 59 | Admitting: Podiatry

## 2023-04-02 ENCOUNTER — Ambulatory Visit: Payer: 59

## 2023-04-02 ENCOUNTER — Other Ambulatory Visit (HOSPITAL_COMMUNITY): Payer: 59

## 2023-04-02 DIAGNOSIS — Z5181 Encounter for therapeutic drug level monitoring: Secondary | ICD-10-CM

## 2023-04-02 DIAGNOSIS — S86311A Strain of muscle(s) and tendon(s) of peroneal muscle group at lower leg level, right leg, initial encounter: Secondary | ICD-10-CM | POA: Diagnosis not present

## 2023-04-02 DIAGNOSIS — F3170 Bipolar disorder, currently in remission, most recent episode unspecified: Secondary | ICD-10-CM

## 2023-04-03 ENCOUNTER — Telehealth: Payer: Self-pay | Admitting: Podiatry

## 2023-04-03 ENCOUNTER — Telehealth (HOSPITAL_COMMUNITY): Payer: Self-pay | Admitting: *Deleted

## 2023-04-03 LAB — COMPREHENSIVE METABOLIC PANEL
ALT: 19 IU/L (ref 0–32)
AST: 20 IU/L (ref 0–40)
Albumin/Globulin Ratio: 1.7 (ref 1.2–2.2)
Albumin: 4.5 g/dL (ref 3.8–4.9)
Alkaline Phosphatase: 92 IU/L (ref 44–121)
BUN/Creatinine Ratio: 11 (ref 9–23)
BUN: 10 mg/dL (ref 6–24)
Bilirubin Total: 0.4 mg/dL (ref 0.0–1.2)
CO2: 24 mmol/L (ref 20–29)
Calcium: 10.1 mg/dL (ref 8.7–10.2)
Chloride: 103 mmol/L (ref 96–106)
Creatinine, Ser: 0.9 mg/dL (ref 0.57–1.00)
Globulin, Total: 2.7 g/dL (ref 1.5–4.5)
Glucose: 82 mg/dL (ref 70–99)
Potassium: 5 mmol/L (ref 3.5–5.2)
Sodium: 142 mmol/L (ref 134–144)
Total Protein: 7.2 g/dL (ref 6.0–8.5)
eGFR: 77 mL/min/{1.73_m2} (ref >59)

## 2023-04-03 LAB — CBC WITH DIFFERENTIAL/PLATELET
Basophils Absolute: 0 10*3/uL (ref 0.0–0.2)
Basos: 0 %
EOS (ABSOLUTE): 0 10*3/uL (ref 0.0–0.4)
Eos: 0 %
Hematocrit: 41.2 % (ref 34.0–46.6)
Hemoglobin: 13.7 g/dL (ref 11.1–15.9)
Immature Grans (Abs): 0 10*3/uL (ref 0.0–0.1)
Immature Granulocytes: 1 %
Lymphocytes Absolute: 2.7 10*3/uL (ref 0.7–3.1)
Lymphs: 44 %
MCH: 28.2 pg (ref 26.6–33.0)
MCHC: 33.3 g/dL (ref 31.5–35.7)
MCV: 85 fL (ref 79–97)
Monocytes Absolute: 0.5 10*3/uL (ref 0.1–0.9)
Monocytes: 8 %
Neutrophils Absolute: 3 10*3/uL (ref 1.4–7.0)
Neutrophils: 47 %
Platelets: 370 10*3/uL (ref 150–450)
RBC: 4.86 x10E6/uL (ref 3.77–5.28)
RDW: 14.4 % (ref 11.7–15.4)
WBC: 6.2 10*3/uL (ref 3.4–10.8)

## 2023-04-03 LAB — TSH: TSH: 1.75 u[IU]/mL (ref 0.450–4.500)

## 2023-04-03 LAB — HEMOGLOBIN A1C
Est. average glucose Bld gHb Est-mCnc: 120 mg/dL
Hgb A1c MFr Bld: 5.8 % — ABNORMAL HIGH (ref 4.8–5.6)

## 2023-04-03 LAB — LIPID PANEL
Chol/HDL Ratio: 5.5 ratio — ABNORMAL HIGH (ref 0.0–4.4)
Cholesterol, Total: 198 mg/dL (ref 100–199)
HDL: 36 mg/dL — ABNORMAL LOW (ref >39)
LDL Chol Calc (NIH): 127 mg/dL — ABNORMAL HIGH (ref 0–99)
Triglycerides: 194 mg/dL — ABNORMAL HIGH (ref 0–149)
VLDL Cholesterol Cal: 35 mg/dL (ref 5–40)

## 2023-04-03 LAB — SPECIMEN STATUS REPORT

## 2023-04-03 NOTE — Telephone Encounter (Signed)
Pt states that her insurance will pay for her support shoes for her ankle with a provider prescription for medical necessity along with a prior authorization. The brand name covered is [employer]. She states that the provider can submit directly to the supplier or to [facility].

## 2023-04-03 NOTE — Telephone Encounter (Signed)
Thank you, will hold off on refill until tom when patient is seen. Also need to review labs with patient tom.

## 2023-04-03 NOTE — Telephone Encounter (Signed)
Fax request for patient to have an advance refill for her quetiapine, she should have been out on or around the 8th of March. She has an appt tomorrow at 9 with her provider. I will forward the request but due to having an appt tomorrow may have to wait till the am when seen.

## 2023-04-04 ENCOUNTER — Ambulatory Visit (HOSPITAL_COMMUNITY): Payer: 59 | Admitting: Student in an Organized Health Care Education/Training Program

## 2023-04-04 ENCOUNTER — Encounter (HOSPITAL_COMMUNITY): Payer: Self-pay | Admitting: Student in an Organized Health Care Education/Training Program

## 2023-04-04 DIAGNOSIS — F419 Anxiety disorder, unspecified: Secondary | ICD-10-CM

## 2023-04-04 DIAGNOSIS — F3170 Bipolar disorder, currently in remission, most recent episode unspecified: Secondary | ICD-10-CM

## 2023-04-04 MED ORDER — GABAPENTIN 400 MG PO CAPS: 400.0000 mg | ORAL_CAPSULE | Freq: Three times a day (TID) | ORAL | 0 refills | Status: DC

## 2023-04-04 MED ORDER — LAMOTRIGINE 100 MG PO TABS: 100.0000 mg | ORAL_TABLET | Freq: Every day | ORAL | 0 refills | Status: DC

## 2023-04-04 MED ORDER — QUETIAPINE FUMARATE ER 400 MG PO TB24: 400.0000 mg | ORAL_TABLET | Freq: Every day | ORAL | 0 refills | Status: DC

## 2023-04-04 MED ORDER — MIRTAZAPINE 30 MG PO TBDP
ORAL_TABLET | ORAL | 0 refills | Status: DC
Start: 2023-04-04 — End: 2023-08-01

## 2023-04-04 MED ORDER — QUETIAPINE FUMARATE 50 MG PO TABS
50.0000 mg | ORAL_TABLET | Freq: Every day | ORAL | 0 refills | Status: AC
Start: 2023-04-04 — End: 2024-04-03

## 2023-04-04 NOTE — Progress Notes (Signed)
BH MD/PA/NP OP Progress Note  04/04/2023 4:02 PM Jasmine Jackson  MRN:  [id number]  Chief Complaint:  Chief Complaint  Patient presents with   Follow-up   HPI: Jasmine Jackson is a 52 yo with PPH bipolar disorder, PTSD, anxiety, panic attacks, and depression.  Patient reports she has been compliant with the following medication regimen:   Gabapentin 400 mg 3 times daily Lamictal 100 mg daily Seroquel XR 400 mg nightly Seroquel 100 mg nightly Remeron 30 mg nightly Trazodone  QHS PRN  Patient reports that she is compliant with her meds. Patient reports that overall she is doing fairly well. She reports that she has some anxiety this AM, about medication changes. Patient reports that she otherwise does not have anxiety. Patient reports that her sleep is good and she has a good appetite. Patient endorses some racing thoughts during the day, but she is able to redirect herself and is able to get all task completed and stay organized. Patient denies impulsive actions lately. Patient denies SI, HI, and AVH.   Patient did endorse that she recalls being started on Lamictal and feeling that it is extremely beneficial and does not wish to have this adjusted any time soon.  Patient reports that she feels that keeps her mood very stable.  Visit Diagnosis:    ICD-10-CM   1. Bipolar disorder in full remission, most recent episode unspecified type  F31.70 QUEtiapine (SEROQUEL XR) 400 MG 24 hr tablet    QUEtiapine (SEROQUEL) 50 MG tablet    gabapentin (NEURONTIN) 400 MG capsule    lamoTRIgine (LAMICTAL) 100 MG tablet    2. Anxiety  F41.9 mirtazapine (REMERON SOL-TAB) 30 MG disintegrating tablet    gabapentin (NEURONTIN) 400 MG capsule      Past Psychiatric History:   Bipolar disorder, PTSD, Anxiety, Tobacco use, OCD-like behaviors    Last visit 09/2022-concern for extremely high dose of Seroquel and adverse side effects due to this, decrease Seroquel XR to 300 mg then 200 mg. Patient called  later endorsing problems with sleep, started patient on trazodone 25 to 50 mg nightly as needed  02/2023-continue decrease of Seroquel.  Seroquel decreased from 200 mg to 100 mg and continued Seroquel XR 400 mg.  Patient endorses on arrival that she had been taking Seroquel XR 400 and had decreased the regular Seroquel to the previously mentioned 200.  Also concern that patient does not appear to recall a true manic episode however patient was very nervous about titrating down his Seroquel.  Discussion had with patient about concern for polypharmacy and adverse metabolic side effects especially from being on Seroquel.  Past Medical History:  Past Medical History:  Diagnosis Date   Anemia    06/19/21-pt has no recollection of this dx   Anxiety    Anxiety and depression    Asthma    Bipolar 1 disorder    Depression    Excessive daytime sleepiness 11/18/2017   GERD (gastroesophageal reflux disease)    Heart murmur    Hemorrhoids    Hiatal hernia    Hypertension    Internal hemorrhoids    Irritable bowel disease 2014   Toe injury 11/18/2017    Past Surgical History:  Procedure Laterality Date   COLONOSCOPY  2021   HEMORRHOID SURGERY     INGUINAL HERNIA REPAIR     PARTIAL HYSTERECTOMY     fibroids   TONSILLECTOMY     UPPER GASTROINTESTINAL ENDOSCOPY      Family Psychiatric History: Son-autism  Family History:  Family History  Problem Relation Age of Onset   Breast cancer Maternal Aunt        x4   Stomach cancer Maternal Aunt    Prostate cancer Maternal Uncle        x2   Breast cancer Maternal Grandmother    Breast cancer Mother 21   Colon polyps Mother 22   Breast cancer Paternal Grandmother    Colon cancer Neg Hx    Esophageal cancer Neg Hx    Rectal cancer Neg Hx     Social History:  Social History   Socioeconomic History   Marital status: Single    Spouse name: Not on file   Number of children: 1   Years of education: Not on file   Highest education level:  Not on file  Occupational History   Occupation: Disability  Tobacco Use   Smoking status: Every Day    Packs/day: 0.50    Years: 37.00    Additional pack years: 0.00    Total pack years: 18.50    Types: Cigarettes   Smokeless tobacco: Never   Tobacco comments:    10  CIG A DAY  Vaping Use   Vaping Use: Never used  Substance and Sexual Activity   Alcohol use: No    Alcohol/week: 0.0 standard drinks of alcohol   Drug use: No   Sexual activity: Yes    Birth control/protection: Condom    Comment: both  Other Topics Concern   Not on file  Social History Narrative   Not on file   Social Determinants of Health   Financial Resource Strain: Not on file  Food Insecurity: Not on file  Transportation Needs: Not on file  Physical Activity: Not on file  Stress: Not on file  Social Connections: Not on file    Allergies:  Allergies  Allergen Reactions   Asa [Aspirin] Other (See Comments)    Heart flutters   Banana Itching    Metabolic Disorder Labs: Lab Results  Component Value Date   HGBA1C 5.8 (H) 04/02/2023   No results found for: "PROLACTIN" Lab Results  Component Value Date   CHOL 198 04/02/2023   TRIG 194 (H) 04/02/2023   HDL 36 (L) 04/02/2023   CHOLHDL 5.5 (H) 04/02/2023   VLDL 29.8 04/24/2015   LDLCALC 127 (H) 04/02/2023   LDLCALC 89 04/24/2015   Lab Results  Component Value Date   TSH 1.750 04/02/2023   TSH 1.06 05/29/2021    Therapeutic Level Labs: No results found for: "LITHIUM" No results found for: "VALPROATE" No results found for: "CBMZ"  Current Medications: Current Outpatient Medications  Medication Sig Dispense Refill   albuterol (VENTOLIN HFA) 108 (90 Base) MCG/ACT inhaler Inhale 2 puffs into the lungs every 6 (six) hours as needed for wheezing or shortness of breath. (Patient not taking: Reported on 12/24/2022) 3 each 0   AMBULATORY NON FORMULARY MEDICATION Medication Name: GI Cocktail 90 ml 2% viscous lidocaine 90 ml bentyl 10mg /1ml 270 ml  Mylanta SIG 10 ml bid prn gas and stomach pain-do not use more than 3 days in a row 600 mL 2   amLODipine (NORVASC) 5 MG tablet TAKE 1 TABLET EVERY DAY (Patient taking differently: TAKE 1 10 mg TABLET EVERY DAY) 90 tablet 1   cyclobenzaprine (FEXMID) 7.5 MG tablet TAKE 1 TABLET(7.5 MG) BY MOUTH THREE TIMES DAILY AS NEEDED FOR MUSCLE SPASMS 30 tablet 3   diclofenac (VOLTAREN) 75 MG EC tablet TAKE 1 TABLET(75 MG)  BY MOUTH TWICE DAILY 60 tablet 2   diclofenac Sodium (VOLTAREN) 1 % GEL APPLY 2 GRAMS TOPICALLY AS NEEDED 100 g 2   dicyclomine (BENTYL) 10 MG capsule Take 10 mg by mouth in the morning, at noon, in the evening, and at bedtime.     gabapentin (NEURONTIN) 100 MG capsule TAKE 2 CAPSULES(200 MG) BY MOUTH THREE TIMES DAILY 180 capsule 2   gabapentin (NEURONTIN) 400 MG capsule Take 1 capsule (400 mg total) by mouth 3 (three) times daily. 270 capsule 0   hydrochlorothiazide (HYDRODIURIL) 25 MG tablet TAKE 1 TABLET(25 MG) BY MOUTH DAILY 90 tablet 1   hydrOXYzine (ATARAX) 50 MG tablet Take 1 tablet (50 mg total) by mouth 3 (three) times daily as needed. 90 tablet 3   lamoTRIgine (LAMICTAL) 100 MG tablet Take 1 tablet (100 mg total) by mouth daily. 90 tablet 0   linaclotide (LINZESS) 290 MCG CAPS capsule TAKE 1 CAPSULE(290 MCG) BY MOUTH DAILY BEFORE BREAKFAST 90 capsule 3   methylPREDNISolone (MEDROL DOSEPAK) 4 MG TBPK tablet 6 day dose pack - take as directed 21 tablet 0   mirtazapine (REMERON SOL-TAB) 30 MG disintegrating tablet DISSOLVE 1 TABLET(15 MG) ON THE TONGUE AT BEDTIME 90 tablet 0   ondansetron (ZOFRAN-ODT) 8 MG disintegrating tablet Take 1 tablet (8 mg total) by mouth every 8 (eight) hours as needed for nausea or vomiting. 30 tablet 0   pantoprazole (PROTONIX) 40 MG tablet TAKE 1 TABLET(40 MG) BY MOUTH TWICE DAILY 180 tablet 0   prochlorperazine (COMPAZINE) 25 MG suppository Use 1 suppository rectally every 6-8 hours as needed nausea/vomiting 12 suppository 3   QUEtiapine (SEROQUEL XR)  400 MG 24 hr tablet Take 1 tablet (400 mg total) by mouth at bedtime. Take 2 tablets at bedtime (800) 90 tablet 0   QUEtiapine (SEROQUEL) 50 MG tablet Take 1 tablet (50 mg total) by mouth at bedtime. 90 tablet 0   sucralfate (CARAFATE) 1 g tablet Take 1 g by mouth with breakfast, with lunch, and with evening meal.     traZODone (DESYREL) 50 MG tablet Take 1 tablet (50 mg total) by mouth at bedtime and may repeat dose one time if needed. Take 50-100mg  QHS PRN. 30 tablet 2   No current facility-administered medications for this visit.     Musculoskeletal: Strength & Muscle Tone: within normal limits Gait & Station: normal Patient leans: N/A  Psychiatric Specialty Exam: Review of Systems  Psychiatric/Behavioral:  Negative for dysphoric mood, hallucinations and suicidal ideas. The patient is not nervous/anxious.     Blood pressure 131/80, pulse 88, resp. rate 16, weight 218 lb (98.9 kg).Body mass index is 35.19 kg/m.  General Appearance: Fairly Groomed  Eye Contact:  Good  Speech:  Clear and Coherent  Volume:  Normal  Mood:  Euthymic  Affect:  Appropriate  Thought Process:  Coherent  Orientation:  Full (Time, Place, and Person)  Thought Content: Logical   Suicidal Thoughts:  No  Homicidal Thoughts:  No  Memory:  Immediate;   Good Recent;   Good  Judgement:  Good  Insight:  Good  Psychomotor Activity:  Normal  Concentration:  Concentration: Good  Recall:  NA  Fund of Knowledge: Good  Language: Good  Akathisia:  NA  Handed:    AIMS (if indicated): not done  Assets:  Communication Skills Desire for Improvement Financial Resources/Insurance Housing Leisure Time Resilience Social Support  ADL's:  Intact  Cognition: WNL  Sleep:  Good   Screenings: GAD-7  Flowsheet Row Office Visit from 07/25/2022 in [facility] Video Visit from 01/16/2022 in [facility] Video Visit from 10/22/2021 in [facility] Video Visit from 07/16/2021 in [facility]  Total GAD-7 Score 4 2 4 18       PHQ2-9    Flowsheet Row Office Visit from 07/25/2022 in [facility] Office Visit from 07/18/2022 in [facility] Office Visit from 03/27/2022 in [facility] Video Visit from 01/16/2022 in [facility] Video Visit from 10/22/2021 in [facility]  PHQ-2 Total Score 0 0 0 0 1  PHQ-9 Total Score 6 -- -- 1 5      Flowsheet Row ED from 09/09/2022 in [facility] Emergency Department at [facility]  C-SSRS RISK CATEGORY No Risk        Assessment and Plan: Patient assessment today patient overall continues to appear to be in remission.  Patient's mood is stable, and patient has been following instructions for titration down on Seroquel.  Patient did present for labs and they were reviewed with patient today.  Did recommend continued titration down on Seroquel as she likely does not need high dose that she was on previously, and would like to minimize exposure especially given patient's labs resulting in knowledge of hyperlipidemia and prediabetes.  Also recommended that patient go over these labs with her PCP which patient appreciated.  Did asked that patient attempt to get an EKG with her PCP.  At this time patient's hyperlipidemia and prediabetes may likely be positively impacted by lifestyle changes as well as continued downward titration on Seroquel.  Patient is anxious about decreasing Seroquel use however was willing to continue slowly.  Bipolar disorder, in full remission vs Mood disorder , otherwise specified Hx of GAD - Continue Lamictal 100 mg daily  - Continue Remeron 30mg  QHS - Continue gabapentin 400 mg 3 times daily - Decrease Seroquel 100mg  to 50mg  - Continue Seroquel XR 400mg  QHS  Collaboration of Care: Collaboration of Care:    Patient/Guardian was advised Release of Information must be obtained prior to any record release in order to collaborate their care with an outside provider. Patient/Guardian was advised if they have not already done so to contact the registration department to sign all necessary forms in order for [place] to release information regarding their care.   Consent: Patient/Guardian gives verbal consent for treatment and assignment of benefits for services provided during this visit. Patient/Guardian expressed understanding and agreed to proceed.   PGY-3 [name], MD 04/04/2023, 4:02 PM

## 2023-04-06 NOTE — Progress Notes (Signed)
  Subjective:  Patient ID: Jasmine Jackson, female    DOB: 06-11-1971,  MRN: [id number]  Chief Complaint  Patient presents with   Tendonitis    Peroneal Tendinopathy( completes PT 4.24.2024)(pt running late)    52 y.o. female presents with the above complaint. History confirmed with patient.  She is doing much better she is not having any pain or functional limitations, physical therapy has been successful for her  Objective:  Physical Exam: warm, good capillary refill, no trophic changes or ulcerative lesions, normal DP and PT pulses, normal sensory exam, and no pain to palpation along lateral ankle or tendons   MRI 04/30/2022 IMPRESSION:: IMPRESSION: 1. 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 2. Mild degenerative spurring at the distal aspect of the medial malleolus. 3. Mild-to-moderate degenerative changes of the posterior aspect of the posterior subtalar joint. 4. Mild talonavicular and second tarsometatarsal osteoarthritis.     Electronically Signed   By: Neita Garnet M.D.   On: 04/30/2022 15:06  Assessment:   1. Tear of peroneal tendon, right, initial encounter      Plan:  Patient was evaluated and treated and all questions answered.  The made quite a bit of significant therapeutic progress.  She may begin to resume regular activity and shoe gear as tolerated.  Insurance covers athletic shoes and she will let me know where and what she is going to get and I can write her an order for this.  Hopefully can avoid surgery at this point, follow-up as needed in the office  Return if symptoms worsen or fail to improve.

## 2023-04-09 ENCOUNTER — Other Ambulatory Visit: Payer: Self-pay

## 2023-04-09 ENCOUNTER — Telehealth (HOSPITAL_COMMUNITY): Payer: Self-pay | Admitting: *Deleted

## 2023-04-09 DIAGNOSIS — M62838 Other muscle spasm: Secondary | ICD-10-CM

## 2023-04-09 NOTE — Telephone Encounter (Signed)
After reviewing chart, patient has established care at [facility] with Tracey Harries. First appointment was dated on 09/23/22.  Removing Dr. Willette Cluster as PCP.

## 2023-04-09 NOTE — Telephone Encounter (Signed)
Pharmacy called to clarify medication dosage for Seroquel XR. Written as "Take one tablet (  total) at bedtime. Take 2 tablets at bedtime (800)." Pharmacy asking for new prescription to be sent with clarification of dosage. Note in chart states to continue at . Message sent to MD for clarification. [facility] [phone].

## 2023-04-10 ENCOUNTER — Other Ambulatory Visit (HOSPITAL_COMMUNITY): Payer: Self-pay | Admitting: Student in an Organized Health Care Education/Training Program

## 2023-04-10 DIAGNOSIS — F3170 Bipolar disorder, currently in remission, most recent episode unspecified: Secondary | ICD-10-CM

## 2023-04-10 MED ORDER — QUETIAPINE FUMARATE ER 400 MG PO TB24: 400.0000 mg | ORAL_TABLET | Freq: Every day | ORAL | 0 refills | Status: DC

## 2023-04-10 NOTE — Telephone Encounter (Signed)
Done

## 2023-04-14 ENCOUNTER — Telehealth: Payer: Self-pay | Admitting: Podiatry

## 2023-04-14 DIAGNOSIS — S86311A Strain of muscle(s) and tendon(s) of peroneal muscle group at lower leg level, right leg, initial encounter: Secondary | ICD-10-CM

## 2023-04-14 DIAGNOSIS — M2141 Flat foot [pes planus] (acquired), right foot: Secondary | ICD-10-CM

## 2023-04-14 NOTE — Telephone Encounter (Signed)
Pt called back and I explained that a shoe store is not going to file her insurance even if she is given a rx. I explained she would have to find a medical supply store that offers the shoes she is wanting( [employer]) and I told her I was not aware of any medical supply that offers [employer] as an option but did give her the number to [facility] to check with them. She said thank you and would call back if she locates one.

## 2023-04-14 NOTE — Telephone Encounter (Signed)
Pt called back and [facility] is not accepting any new pts. She has an appt today and needs prior authorization. I explained that I would need the name of the facility that she is going to do a prior auth and [facility]. I explained they are not a medical facility and do not bill insurance. There is no doctor on staff at [facility]. She asked about the [facility] as well and I told her they are not a medical facility. I explained that she has a medicare plan and they do not cover off the shelf shoes. They cover diabetic shoes and she is not a diabetic and they do cover custom shoes but she would have to have a serious foot deformity. I did explain that insurances do not cover shoes she would purchase from a shoe store usually. She could see if the insurance company would reimburse her for the [facility] but I have never seen it be done.

## 2023-04-14 NOTE — Telephone Encounter (Signed)
Patient called [employer] about getting custom orthotics , RX has to be sent to their designated provider  - please fax RX to :  [facility] -email address [email or web address] or phone number [phone]   [employer] [email or web address]  or call [phone]

## 2023-04-22 ENCOUNTER — Ambulatory Visit: Payer: 59 | Admitting: Podiatry

## 2023-05-05 ENCOUNTER — Telehealth (HOSPITAL_COMMUNITY): Payer: Self-pay | Admitting: Student in an Organized Health Care Education/Training Program

## 2023-05-05 ENCOUNTER — Telehealth (HOSPITAL_COMMUNITY): Payer: Self-pay | Admitting: *Deleted

## 2023-05-05 ENCOUNTER — Other Ambulatory Visit (HOSPITAL_COMMUNITY): Payer: Self-pay | Admitting: Student in an Organized Health Care Education/Training Program

## 2023-05-05 ENCOUNTER — Other Ambulatory Visit (HOSPITAL_COMMUNITY): Payer: Self-pay | Admitting: Psychiatry

## 2023-05-05 DIAGNOSIS — F419 Anxiety disorder, unspecified: Secondary | ICD-10-CM

## 2023-05-05 MED ORDER — HYDROXYZINE HCL 50 MG PO TABS: 50.0000 mg | ORAL_TABLET | Freq: Three times a day (TID) | ORAL | 1 refills | Status: DC | PRN

## 2023-05-05 NOTE — Telephone Encounter (Signed)
Rx Refill Request  hydrOXYzine (ATARAX) 50 MG tablet   Dose: 50 mg Route: Oral Frequency: 3 times daily PRN  Dispense Quantity: 90 tablet Refills: 3        Sig: Take 1 tablet (50 mg total) by mouth 3 (three) times daily as needed.       Start Date: 07/01/22 End Date: --  Written Date: 07/01/22 Expiration Date: 07/01/23     Associated Diagnoses: Anxiety [F41.9]  Original Order: hydrOXYzine (ATARAX) 50 MG tablet [[id number]]

## 2023-05-05 NOTE — Telephone Encounter (Signed)
Done

## 2023-05-28 ENCOUNTER — Other Ambulatory Visit: Payer: Self-pay | Admitting: Podiatry

## 2023-06-04 NOTE — Telephone Encounter (Signed)
"  I'm calling because the prescription, Diclofenac, prescribed by Sharl Ma was rejected.  If someone could, call me with an explanation."

## 2023-06-13 ENCOUNTER — Telehealth (HOSPITAL_COMMUNITY): Payer: 59 | Admitting: Student in an Organized Health Care Education/Training Program

## 2023-06-25 ENCOUNTER — Telehealth (HOSPITAL_COMMUNITY): Payer: Self-pay | Admitting: *Deleted

## 2023-06-25 NOTE — Telephone Encounter (Signed)
Patient called for refill of Trazodone. Chart reviewed, no appointment scheduled. Transferred to front desk to schedule next appointment.

## 2023-06-27 ENCOUNTER — Other Ambulatory Visit (HOSPITAL_COMMUNITY): Payer: Self-pay | Admitting: Student in an Organized Health Care Education/Training Program

## 2023-06-27 DIAGNOSIS — G4700 Insomnia, unspecified: Secondary | ICD-10-CM

## 2023-06-27 MED ORDER — TRAZODONE HCL 50 MG PO TABS: 50.0000 mg | ORAL_TABLET | Freq: Every evening | ORAL | 0 refills | Status: DC | PRN

## 2023-06-27 NOTE — Telephone Encounter (Signed)
Thank you done!

## 2023-07-03 ENCOUNTER — Other Ambulatory Visit (HOSPITAL_COMMUNITY): Payer: Self-pay | Admitting: Student in an Organized Health Care Education/Training Program

## 2023-07-03 ENCOUNTER — Telehealth (HOSPITAL_COMMUNITY): Payer: Self-pay

## 2023-07-03 DIAGNOSIS — F419 Anxiety disorder, unspecified: Secondary | ICD-10-CM

## 2023-07-03 DIAGNOSIS — F3170 Bipolar disorder, currently in remission, most recent episode unspecified: Secondary | ICD-10-CM

## 2023-07-03 MED ORDER — QUETIAPINE FUMARATE 50 MG PO TABS
50.0000 mg | ORAL_TABLET | Freq: Every day | ORAL | 0 refills | Status: DC
Start: 2023-07-03 — End: 2023-08-01

## 2023-07-03 MED ORDER — QUETIAPINE FUMARATE ER 400 MG PO TB24: 400.0000 mg | ORAL_TABLET | Freq: Every day | ORAL | 0 refills | Status: DC

## 2023-07-03 MED ORDER — GABAPENTIN 400 MG PO CAPS
400.0000 mg | ORAL_CAPSULE | Freq: Three times a day (TID) | ORAL | 0 refills | Status: DC
Start: 2023-07-03 — End: 2023-08-01

## 2023-07-03 NOTE — Telephone Encounter (Signed)
Done

## 2023-07-03 NOTE — Telephone Encounter (Signed)
Medication refills - Faxes received from [facility]'s [facility] for refills of her prescribed Gabapentin 400 mg and Quetiapine 50 mg, both last prescribed 04/04/23 for 90 days each. Patient last seen 04/04/23 and set to return 08/01/23.

## 2023-07-17 ENCOUNTER — Telehealth (HOSPITAL_COMMUNITY): Payer: Self-pay | Admitting: *Deleted

## 2023-07-17 ENCOUNTER — Other Ambulatory Visit (HOSPITAL_COMMUNITY): Payer: Self-pay | Admitting: Student in an Organized Health Care Education/Training Program

## 2023-07-17 DIAGNOSIS — G4700 Insomnia, unspecified: Secondary | ICD-10-CM

## 2023-07-17 MED ORDER — TRAZODONE HCL 100 MG PO TABS: 100.0000 mg | ORAL_TABLET | Freq: Every day | ORAL | 0 refills | Status: DC

## 2023-07-17 NOTE — Telephone Encounter (Signed)
Ok will do, thanks!

## 2023-07-17 NOTE — Telephone Encounter (Signed)
Patient called stating that she is taking Trazodone 100mg  nightly and does not have enough pills in her bottle. She was given 30 pills of 50mg  but wants the MD to be made aware that the dosage was suppose to be 100mg  at bedtime. Asking that it be changed so she has enough medication to help her sleep.

## 2023-08-01 ENCOUNTER — Encounter (HOSPITAL_COMMUNITY): Payer: Self-pay | Admitting: Student in an Organized Health Care Education/Training Program

## 2023-08-01 ENCOUNTER — Telehealth (HOSPITAL_COMMUNITY): Payer: 59 | Admitting: Student in an Organized Health Care Education/Training Program

## 2023-08-01 DIAGNOSIS — F419 Anxiety disorder, unspecified: Secondary | ICD-10-CM

## 2023-08-01 DIAGNOSIS — G4700 Insomnia, unspecified: Secondary | ICD-10-CM

## 2023-08-01 DIAGNOSIS — F3170 Bipolar disorder, currently in remission, most recent episode unspecified: Secondary | ICD-10-CM

## 2023-08-01 DIAGNOSIS — F39 Unspecified mood [affective] disorder: Secondary | ICD-10-CM

## 2023-08-01 MED ORDER — MIRTAZAPINE 30 MG PO TBDP: ORAL_TABLET | ORAL | 0 refills | Status: DC

## 2023-08-01 MED ORDER — QUETIAPINE FUMARATE ER 200 MG PO TB24: 600.0000 mg | ORAL_TABLET | Freq: Every day | ORAL | 1 refills | Status: DC

## 2023-08-01 MED ORDER — LAMOTRIGINE 100 MG PO TABS: 100.0000 mg | ORAL_TABLET | Freq: Every day | ORAL | 0 refills | Status: DC

## 2023-08-01 MED ORDER — TRAZODONE HCL 100 MG PO TABS
100.0000 mg | ORAL_TABLET | Freq: Every evening | ORAL | 1 refills | Status: DC | PRN
Start: 2023-08-01 — End: 2023-09-05

## 2023-08-01 MED ORDER — QUETIAPINE FUMARATE 50 MG PO TABS
50.0000 mg | ORAL_TABLET | Freq: Every day | ORAL | 0 refills | Status: DC
Start: 2023-08-01 — End: 2023-09-05

## 2023-08-01 MED ORDER — GABAPENTIN 400 MG PO CAPS
400.0000 mg | ORAL_CAPSULE | Freq: Three times a day (TID) | ORAL | 0 refills | Status: DC
Start: 2023-08-01 — End: 2023-09-05

## 2023-08-01 NOTE — Progress Notes (Signed)
Virtual Visit via Video Note  I connected with Jasmine Jackson on 08/01/23 at  8:30 AM EDT by a video enabled telemedicine application and verified that I am speaking with the correct person using two identifiers.  Location: Patient: Home Provider: Office   I discussed the limitations of evaluation and management by telemedicine and the availability of in person appointments. The patient expressed understanding and agreed to proceed.     I discussed the assessment and treatment plan with the patient. The patient was provided an opportunity to ask questions and all were answered. The patient agreed with the plan and demonstrated an understanding of the instructions.   The patient was advised to call back or seek an in-person evaluation if the symptoms worsen or if the condition fails to improve as anticipated.  I provided 25 minutes of non-face-to-face time during this encounter.   Jasmine Morton, MD  [facility] MD/PA/NP OP Progress Note  08/01/2023 1:53 PM Jasmine Jackson  MRN:  [id number]  Chief Complaint:  Chief Complaint  Patient presents with   Follow-up   HPI: Jasmine Jackson is a 52 yo with PPH unspecified mood disorder, PTSD, anxiety, panic attacks, and depression.  Patient reports she has been compliant with the following medication regimen:   Gabapentin 400 mg 3 times daily Lamictal 100 mg daily Seroquel XR 400 mg nightly (patient has a prescription taking 800mg , she showed the bottle and it says take 2 of the tablets every night however the computer rx says take 1 tablet, unsure where the miscommunication is coming from)  Seroquel 50 mg nightly Remeron 30 mg nightly Trazodone 100mg  QHS PRN (taking nightly)  Patient reports that has been stressed unfortunately, her mother had a CVA and has been struggling. Patient reports that she has no siblings, so this has been difficult for her to handle. Patient reports that her mother has a been a bit difficult as a patient and makes it hard  to care for her. Patient is worried that APS may come in because her mother wont listen to her. Patient has realized that bringing her mother to her own home, would be detrimental to her own house. Patient reports that she is sleeping fairly well, it takes about 1-1hr , but she does not spend this entire time in bed, she known about how long it will take for her Seroquel to kick in. Patient reports she is sleeping about 5hrs and feels well rested the next day. Patient reports that energy is low, because she is spending a lot of time running around trying to take care of her mother, but she has enough energy to get what she needs done. Patient reports that her appetite is good. She has a more plant based diet. She reports averaging 2 meals/ day. Patient reports that outside of her mom situation she would not really feel down or dysphoric. Patient does reports that she feels her mom's situation is consuming her and she is spending a lot of time thinking about her mother. Patient reports that she is still able to find joy talking to her plants that she has named. Patient endorses she really loves gardening and seeing how her plants grow. Patient denies AVH. Patient denies Active and passive SI. Patient endorses her son is a protective factor.  Patient endorses some feelings of nervousness and on edge, because her mom is a high fall risk and she has already fallen and had to call EMS. She denies general anxiety. Patient does think  she has been more irritable lately especially with her mother, not listening. Patient reports that she is trying to identify what she can and cannot control. Patient reports that her mother has been verbally abusive even before the CVA but it has gotten worse.   Patient is trying to get her EKG done, she did try to get it done at PCP a few days ago, but they were having tech issues.   Visit Diagnosis:    ICD-10-CM   1. Unspecified mood (affective) disorder (HCC)  F39     2.  Bipolar disorder in full remission, most recent episode unspecified type (HCC)  F31.70     3. Anxiety  F41.9     4. Insomnia, unspecified type  G47.00       Past Psychiatric History: Last visit 09/2022-concern for extremely high dose of Seroquel and adverse side effects due to this, decrease Seroquel XR to 300 mg then 200 mg. Patient called later endorsing problems with sleep, started patient on trazodone 25 to 50 mg nightly as needed   02/2023-continue decrease of Seroquel.  Seroquel decreased from 200 mg to 100 mg and continued Seroquel XR 400 mg.  Patient endorses on arrival that she had been taking Seroquel XR 400 and had decreased the regular Seroquel to the previously mentioned 200.  Also concern that patient does not appear to recall a true manic episode however patient was very nervous about titrating down his Seroquel.  Discussion had with patient about concern for polypharmacy and adverse metabolic side effects especially from being on Seroquel. 03/2023- Mood stable, continues to appear in remission.  Labs also indicated that patient was prediabetic and had hyperlipidemia, patient was okay with continuing down on Seroquel after discussion that this medication could increase or worsen risk for these diseases.  Past Medical History:  Past Medical History:  Diagnosis Date   Anemia    06/19/21-pt has no recollection of this dx   Anxiety    Anxiety and depression    Asthma    Bipolar 1 disorder (HCC)    Depression    Excessive daytime sleepiness 11/18/2017   GERD (gastroesophageal reflux disease)    Heart murmur    Hemorrhoids    Hiatal hernia    Hypertension    Internal hemorrhoids    Irritable bowel disease 2014   Toe injury 11/18/2017    Past Surgical History:  Procedure Laterality Date   COLONOSCOPY  2021   HEMORRHOID SURGERY     INGUINAL HERNIA REPAIR     PARTIAL HYSTERECTOMY     fibroids   TONSILLECTOMY     UPPER GASTROINTESTINAL ENDOSCOPY      Family Psychiatric  History: Son-autism    Family History:  Family History  Problem Relation Age of Onset   Breast cancer Maternal Aunt        x4   Stomach cancer Maternal Aunt    Prostate cancer Maternal Uncle        x2   Breast cancer Maternal Grandmother    Breast cancer Mother 44   Colon polyps Mother 60   Breast cancer Paternal Grandmother    Colon cancer Neg Hx    Esophageal cancer Neg Hx    Rectal cancer Neg Hx     Social History:  Social History   Socioeconomic History   Marital status: Single    Spouse name: Not on file   Number of children: 1   Years of education: Not on file   Highest education  level: Not on file  Occupational History   Occupation: Disability  Tobacco Use   Smoking status: Every Day    Current packs/day: 0.50    Average packs/day: 0.5 packs/day for 37.0 years (18.5 ttl pk-yrs)    Types: Cigarettes   Smokeless tobacco: Never   Tobacco comments:    10  CIG A DAY  Vaping Use   Vaping status: Never Used  Substance and Sexual Activity   Alcohol use: No    Alcohol/week: 0.0 standard drinks of alcohol   Drug use: No   Sexual activity: Yes    Birth control/protection: Condom    Comment: both  Other Topics Concern   Not on file  Social History Narrative   Not on file   Social Determinants of Health   Financial Resource Strain: Low Risk  (07/08/2023)   Received from [facility]   Overall Financial Resource Strain (CARDIA)    Difficulty of Paying Living Expenses: Not very hard  Recent Concern: Financial Resource Strain - Medium Risk (06/20/2023)   Received from [facility]   Overall Financial Resource Strain (CARDIA)    Difficulty of Paying Living Expenses: Somewhat hard  Food Insecurity: No Food Insecurity (07/08/2023)   Received from [facility]   Hunger Vital Sign    Worried About Running Out of Food in the Last Year: Never true    Ran Out of Food in the Last Year: Never true  Transportation Needs: No Transportation Needs  (07/08/2023)   Received from [facility] - Transportation    Lack of Transportation (Medical): No    Lack of Transportation (Non-Medical): No  Physical Activity: Insufficiently Active (06/20/2023)   Received from [facility], [facility]   Exercise Vital Sign    Days of Exercise per Week: 2 days    Minutes of Exercise per Session: 20 min  Stress: No Stress Concern Present (06/20/2023)   Received from [facility] - Occupational Stress Questionnaire    Feeling of Stress : Not at all  Social Connections: Socially Integrated (06/20/2023)   Received from [facility], [facility]   Social Network    How would you rate your social network (family, work, friends)?: Good participation with social networks    Allergies:  Allergies  Allergen Reactions   Asa [Aspirin] Other (See Comments)    Heart flutters   Banana Itching    Metabolic Disorder Labs: Lab Results  Component Value Date   HGBA1C 5.8 (H) 04/02/2023   No results found for: "PROLACTIN" Lab Results  Component Value Date   CHOL 198 04/02/2023   TRIG 194 (H) 04/02/2023   HDL 36 (L) 04/02/2023   CHOLHDL 5.5 (H) 04/02/2023   VLDL 29.8 04/24/2015   LDLCALC 127 (H) 04/02/2023   LDLCALC 89 04/24/2015   Lab Results  Component Value Date   TSH 1.750 04/02/2023   TSH 1.06 05/29/2021    Therapeutic Level Labs: No results found for: "LITHIUM" No results found for: "VALPROATE" No results found for: "CBMZ"  Current Medications: Current Outpatient Medications  Medication Sig Dispense Refill   albuterol (VENTOLIN HFA) 108 (90 Base) MCG/ACT inhaler Inhale 2 puffs into the lungs every 6 (six) hours as needed for wheezing or shortness of breath. (Patient not taking: Reported on 12/24/2022) 3 each 0   AMBULATORY NON FORMULARY MEDICATION Medication Name: GI Cocktail 90 ml 2% viscous lidocaine 90 ml bentyl 10mg /20ml 270 ml Mylanta SIG 10  ml bid prn gas and  stomach pain-do not use more than 3 days in a row 600 mL 2   amLODipine (NORVASC) 5 MG tablet TAKE 1 TABLET EVERY DAY (Patient taking differently: TAKE 1 10 mg TABLET EVERY DAY) 90 tablet 1   cyclobenzaprine (FEXMID) 7.5 MG tablet TAKE 1 TABLET(7.5 MG) BY MOUTH THREE TIMES DAILY AS NEEDED FOR MUSCLE SPASMS 30 tablet 3   diclofenac (VOLTAREN) 75 MG EC tablet TAKE 1 TABLET(75 MG) BY MOUTH TWICE DAILY 60 tablet 2   diclofenac Sodium (VOLTAREN) 1 % GEL APPLY 2 GRAMS TOPICALLY AS NEEDED 100 g 2   dicyclomine (BENTYL) 10 MG capsule Take 10 mg by mouth in the morning, at noon, in the evening, and at bedtime.     gabapentin (NEURONTIN) 400 MG capsule Take 1 capsule (400 mg total) by mouth 3 (three) times daily. 270 capsule 0   hydrochlorothiazide (HYDRODIURIL) 25 MG tablet TAKE 1 TABLET(25 MG) BY MOUTH DAILY 90 tablet 1   hydrOXYzine (ATARAX) 50 MG tablet Take 1 tablet (50 mg total) by mouth 3 (three) times daily as needed. 90 tablet 1   lamoTRIgine (LAMICTAL) 100 MG tablet Take 1 tablet (100 mg total) by mouth daily. 90 tablet 0   linaclotide (LINZESS) 290 MCG CAPS capsule TAKE 1 CAPSULE(290 MCG) BY MOUTH DAILY BEFORE BREAKFAST 90 capsule 3   methylPREDNISolone (MEDROL DOSEPAK) 4 MG TBPK tablet 6 day dose pack - take as directed 21 tablet 0   mirtazapine (REMERON SOL-TAB) 30 MG disintegrating tablet DISSOLVE 1 TABLET(15 MG) ON THE TONGUE AT BEDTIME 90 tablet 0   ondansetron (ZOFRAN-ODT) 8 MG disintegrating tablet Take 1 tablet (8 mg total) by mouth every 8 (eight) hours as needed for nausea or vomiting. 30 tablet 0   pantoprazole (PROTONIX) 40 MG tablet TAKE 1 TABLET(40 MG) BY MOUTH TWICE DAILY 180 tablet 0   prochlorperazine (COMPAZINE) 25 MG suppository Use 1 suppository rectally every 6-8 hours as needed nausea/vomiting 12 suppository 3   QUEtiapine (SEROQUEL) 50 MG tablet Take 1 tablet (50 mg total) by mouth at bedtime. 90 tablet 0   sucralfate (CARAFATE) 1 g tablet Take 1 g by mouth with breakfast,  with lunch, and with evening meal.     traZODone (DESYREL) 100 MG tablet Take 1 tablet (100 mg total) by mouth at bedtime. Take 50-100mg  QHS PRN. 30 tablet 0   No current facility-administered medications for this visit.      Psychiatric Specialty Exam: Review of Systems  Neurological:  Negative for headaches.  Psychiatric/Behavioral:  Negative for hallucinations.     There were no vitals taken for this visit.There is no height or weight on file to calculate BMI.  General Appearance: Casual  Eye Contact:  Good  Speech:  Clear and Coherent  Volume:  Normal  Mood:  Euthymic  Affect:  Appropriate  Thought Process:  Coherent  Orientation:  Full (Time, Place, and Person)  Thought Content: Logical   Suicidal Thoughts:  No  Homicidal Thoughts:  No  Memory:  Immediate;   Good Recent;   Good  Judgement:  Good  Insight:  Good  Psychomotor Activity:  NA  Concentration:  Concentration: Good  Recall:  NA  Fund of Knowledge: Good  Language: Good  Akathisia:  No  Handed:    AIMS (if indicated): not done  Assets:  Communication Skills Desire for Improvement Housing Leisure Time Resilience Social Support Transportation  ADL's:  Intact  Cognition: WNL  Sleep:  Good   Screenings:  GAD-7    Flowsheet Row Office Visit from 07/25/2022 in [facility] Video Visit from 01/16/2022 in [facility] Video Visit from 10/22/2021 in [facility] Video Visit from 07/16/2021 in [facility]  Total GAD-7 Score 4 2 4 18       PHQ2-9    Flowsheet Row Office Visit from 07/25/2022 in [facility] Office Visit from 07/18/2022 in [facility] Office Visit from 03/27/2022 in [facility] Video Visit from 01/16/2022 in [facility] Video Visit from 10/22/2021 in [facility]  PHQ-2 Total Score 0 0 0 0 1  PHQ-9 Total Score 6 -- -- 1 5      Flowsheet Row ED from 09/09/2022 in [facility] Emergency Department at [facility]  C-SSRS RISK CATEGORY No Risk        Assessment and Plan: Patient appears to be doing fairly well given circumstances.  Patient endorses some situational dysphoria and anxiety however does not meet current criteria for clinical major depression or generalized anxiety disorder.  Unfortunately, patient's medications appear to have been increased in the air back to the patient's Seroquel XR 800 mg dosing.  Patient is less anxious about decreasing Seroquel, and remains on board with the plan of decreasing polypharmacy and decreasing risk for adverse side effects secondary to medications.  Patient is okay with decreasing her Seroquel XR to 600 mg while continuing her Seroquel 50 mg nightly.  We will also change patient's trazodone to nightly as needed with the ultimate goal of being able to stop trazodone and if necessary optimizing Remeron.  Will continue to monitor given patient stressors however at this time patient endorses very good insight and judgment related to these stressors.  At this time no safety concerns for patient.    Mood disorder otherwise specified Hx GAD - Continue Lamictal 100 mg daily  - Continue Remeron 30mg  QHS - Continue gabapentin 400 mg 3 times daily - Continue Seroquel 50 mg nightly - decrease Seroquel 800mg  XR to 600mg  XR - Change Trazodone to 50mg  at bedtime PRN  Follow-up in approximately 2 months  Collaboration of Care: Collaboration of Care:   Patient/Guardian was advised Release of Information must be obtained prior to any record release in order to collaborate their care with an outside provider. Patient/Guardian was advised if they have not already done so to contact the registration department to sign all necessary forms in order for [place] to release information regarding their care.   Consent:  Patient/Guardian gives verbal consent for treatment and assignment of benefits for services provided during this visit. Patient/Guardian expressed understanding and agreed to proceed.   PGY-4 Jasmine Morton, MD 08/01/2023, 1:53 PM

## 2023-08-14 ENCOUNTER — Other Ambulatory Visit: Payer: Self-pay | Admitting: Podiatry

## 2023-08-18 ENCOUNTER — Other Ambulatory Visit (HOSPITAL_COMMUNITY): Payer: Self-pay | Admitting: Student in an Organized Health Care Education/Training Program

## 2023-08-18 ENCOUNTER — Telehealth (HOSPITAL_COMMUNITY): Payer: Self-pay

## 2023-08-18 DIAGNOSIS — F419 Anxiety disorder, unspecified: Secondary | ICD-10-CM

## 2023-08-18 MED ORDER — HYDROXYZINE HCL 50 MG PO TABS
50.0000 mg | ORAL_TABLET | Freq: Three times a day (TID) | ORAL | 1 refills | Status: DC | PRN
Start: 2023-08-18 — End: 2023-10-24

## 2023-08-18 NOTE — Telephone Encounter (Signed)
Thanks for letting me know, that is my fault. Refill for Hydroxyzine has been sent.

## 2023-08-18 NOTE — Telephone Encounter (Signed)
Received a fax from patients pharmacy requesting a refill on Hydroxyzine. I reviewed your last note and did not see medication mentioned. Please review and advise, thank you

## 2023-09-01 ENCOUNTER — Telehealth (HOSPITAL_COMMUNITY): Payer: Self-pay

## 2023-09-03 NOTE — Telephone Encounter (Signed)
I attempted to call the patient back, LVM after 4 attempts (2 at home and 2 at cell). I am not sure what the patient's question is. She is prescribed 600mg  Seroquel XR and she had enough for one more refill before the appt, she was likely due for a refill at the time she called, but I don't see that there were any requests/issues with a refill from the pharmacy. Thanks

## 2023-09-04 ENCOUNTER — Telehealth (HOSPITAL_COMMUNITY): Payer: Self-pay | Admitting: Student in an Organized Health Care Education/Training Program

## 2023-09-05 ENCOUNTER — Telehealth (HOSPITAL_COMMUNITY): Payer: 59 | Admitting: Student in an Organized Health Care Education/Training Program

## 2023-09-05 ENCOUNTER — Encounter (HOSPITAL_COMMUNITY): Payer: Self-pay | Admitting: Student in an Organized Health Care Education/Training Program

## 2023-09-05 DIAGNOSIS — F419 Anxiety disorder, unspecified: Secondary | ICD-10-CM

## 2023-09-05 DIAGNOSIS — F3170 Bipolar disorder, currently in remission, most recent episode unspecified: Secondary | ICD-10-CM

## 2023-09-05 DIAGNOSIS — G4700 Insomnia, unspecified: Secondary | ICD-10-CM

## 2023-09-05 DIAGNOSIS — Z636 Dependent relative needing care at home: Secondary | ICD-10-CM

## 2023-09-05 DIAGNOSIS — F39 Unspecified mood [affective] disorder: Secondary | ICD-10-CM

## 2023-09-05 MED ORDER — MIRTAZAPINE 30 MG PO TBDP
ORAL_TABLET | ORAL | 0 refills | Status: DC
Start: 2023-09-05 — End: 2023-10-24

## 2023-09-05 MED ORDER — QUETIAPINE FUMARATE ER 200 MG PO TB24
200.0000 mg | ORAL_TABLET | Freq: Every day | ORAL | 0 refills | Status: DC
Start: 2023-09-05 — End: 2023-10-10

## 2023-09-05 MED ORDER — PROPRANOLOL HCL 10 MG PO TABS
10.0000 mg | ORAL_TABLET | Freq: Two times a day (BID) | ORAL | 1 refills | Status: DC
Start: 2023-09-05 — End: 2023-12-12

## 2023-09-05 MED ORDER — QUETIAPINE FUMARATE 50 MG PO TABS
50.0000 mg | ORAL_TABLET | Freq: Every day | ORAL | 0 refills | Status: DC
Start: 2023-09-05 — End: 2023-10-24

## 2023-09-05 MED ORDER — QUETIAPINE FUMARATE ER 400 MG PO TB24
400.0000 mg | ORAL_TABLET | Freq: Every day | ORAL | 0 refills | Status: DC
Start: 2023-09-05 — End: 2023-10-10

## 2023-09-05 MED ORDER — PROPRANOLOL HCL 10 MG PO TABS
10.0000 mg | ORAL_TABLET | Freq: Two times a day (BID) | ORAL | 1 refills | Status: DC
Start: 2023-09-05 — End: 2023-09-05

## 2023-09-05 MED ORDER — LAMOTRIGINE 100 MG PO TABS
100.0000 mg | ORAL_TABLET | Freq: Every day | ORAL | 0 refills | Status: DC
Start: 2023-09-05 — End: 2023-10-24

## 2023-09-05 MED ORDER — QUETIAPINE FUMARATE ER 200 MG PO TB24
600.0000 mg | ORAL_TABLET | Freq: Every day | ORAL | 1 refills | Status: DC
Start: 2023-09-05 — End: 2023-09-05

## 2023-09-05 MED ORDER — GABAPENTIN 400 MG PO CAPS
400.0000 mg | ORAL_CAPSULE | Freq: Three times a day (TID) | ORAL | 0 refills | Status: DC
Start: 2023-09-05 — End: 2023-10-24

## 2023-09-05 MED ORDER — TRAZODONE HCL 100 MG PO TABS
100.0000 mg | ORAL_TABLET | Freq: Every evening | ORAL | 1 refills | Status: DC | PRN
Start: 2023-09-05 — End: 2023-10-24

## 2023-09-05 NOTE — Progress Notes (Signed)
Virtual Visit via Video Note  I connected with Jasmine Jackson on 09/05/23 at 10:00 AM EDT by a video enabled telemedicine application and verified that I am speaking with the correct person using two identifiers.  Location: Patient: Home Provider: Office   I discussed the limitations of evaluation and management by telemedicine and the availability of in person appointments. The patient expressed understanding and agreed to proceed.     I discussed the assessment and treatment plan with the patient. The patient was provided an opportunity to ask questions and all were answered. The patient agreed with the plan and demonstrated an understanding of the instructions.   The patient was advised to call back or seek an in-person evaluation if the symptoms worsen or if the condition fails to improve as anticipated.  I provided 25 minutes of non-face-to-face time during this encounter.   [name], MD  [facility] MD/PA/NP OP Progress Note  09/05/2023 1:53 PM Jasmine Jackson  MRN:  [id number]  Chief Complaint:  Chief Complaint  Patient presents with   Follow-up   HPI: Jasmine Jackson is a 52 yo with PPH unspecified mood disorder, PTSD, anxiety, panic attacks, and depression.  Patient reports she has been compliant with the following medication regimen:   -  Lamictal 100 mg daily  -  Remeron 30mg  QHS -  gabapentin 400 mg 3 times daily -  Seroquel 50 mg nightly -  Seroquel 600mg  XR -  Trazodone  50mg  at bedtime (taking nightly) - Hydroxyzine 50mg  TID Recently rx chantix by PCP  Patient reports that she has not been able to take 600mg , tablets but had old 400mg  and has been taking 1.5pills. Patient reports that she needed a PA for the tablets.Patient reports that she is not doing well, but again thinks that is based on her mother's health. Patient reports that she is communicating with her mother's healthcare team and this has been really stressful. Patient reports that this can be really  frustrating. Patient reports that she has had some productive conversations with social services. Patient reports that as a result of her stressors she is having more panic attacks where her finger tingle, tachycardia, diaphoresis. Patient denies LOC with the panic attacks, but she had this in the distant past. Patient reports that she tries to use diaphragmatic breathing. Patient reports that she these panic attacks almost every other day, and has had 2 in 1 day. Patient reports that she spends a lot of time in bed, because she has to lie down to feel better. Patient reports that she has had some in the car and will have to call a family member to come Jackson her up. Patient reports that she had a plan in the past the 3Ps (Phone, pray, pill). Patient has not required a EMS call since covid, but had these in the past with her panic attacks.   Patient reports that she is still requiring 2-4hrs to fall asleep. Patient reports that she does not feel well rested, but knows she is getting sleep. Patient reports that she is on edge and anxious at night. Patient reports that she is also feeling more forgetful and irritable but again thinks this is related to stress.   Patient does not endorse SI, HI, or AVH. Patient endorses caring for a son as well.   Patient denies cocaine or THC use.   Visit Diagnosis:    ICD-10-CM   1. Unspecified mood (affective) disorder (HCC)  F39  2. Insomnia, unspecified type  G47.00     3. Anxiety  F41.9     4. Caregiver burden  Z63.6        Past Psychiatric History: Last visit 09/2022-concern for extremely high dose of Seroquel and adverse side effects due to this, decrease Seroquel XR to 300 mg then 200 mg. Patient called later endorsing problems with sleep, started patient on trazodone 25 to 50 mg nightly as needed   02/2023-continue decrease of Seroquel.  Seroquel decreased from 200 mg to 100 mg and continued Seroquel XR 400 mg.  Patient endorses on arrival that she  had been taking Seroquel XR 400 and had decreased the regular Seroquel to the previously mentioned 200.  Also concern that patient does not appear to recall a true manic episode however patient was very nervous about titrating down his Seroquel.  Discussion had with patient about concern for polypharmacy and adverse metabolic side effects especially from being on Seroquel. 03/2023- Mood stable, continues to appear in remission.  Labs also indicated that patient was prediabetic and had hyperlipidemia, patient was okay with continuing down on Seroquel after discussion that this medication could increase or worsen risk for these diseases. 07/2023- Patient rx of Seroquel were old and patient was again taking higher doses and not the medication changes. Patient had to be decreased back to Seroquel XR 600mg  at bedtime and Serqoeul 50mg  at bedtime. Continued Lamictal and remeron as they were. Patient had some stressors about her mother's health, but otherwise had a fair mood and no other anxiety outside of her mother's health.   Past Medical History:  Past Medical History:  Diagnosis Date   Anemia    06/19/21-pt has no recollection of this dx   Anxiety    Anxiety and depression    Asthma    Bipolar 1 disorder (HCC)    Depression    Excessive daytime sleepiness 11/18/2017   GERD (gastroesophageal reflux disease)    Heart murmur    Hemorrhoids    Hiatal hernia    Hypertension    Internal hemorrhoids    Irritable bowel disease 2014   Toe injury 11/18/2017    Past Surgical History:  Procedure Laterality Date   COLONOSCOPY  2021   HEMORRHOID SURGERY     INGUINAL HERNIA REPAIR     PARTIAL HYSTERECTOMY     fibroids   TONSILLECTOMY     UPPER GASTROINTESTINAL ENDOSCOPY      Family Psychiatric History: Son-autism    Family History:  Family History  Problem Relation Age of Onset   Breast cancer Maternal Aunt        x4   Stomach cancer Maternal Aunt    Prostate cancer Maternal Uncle        x2    Breast cancer Maternal Grandmother    Breast cancer Mother 42   Colon polyps Mother 32   Breast cancer Paternal Grandmother    Colon cancer Neg Hx    Esophageal cancer Neg Hx    Rectal cancer Neg Hx     Social History:  Social History   Socioeconomic History   Marital status: Single    Spouse name: Not on file   Number of children: 1   Years of education: Not on file   Highest education level: Not on file  Occupational History   Occupation: Disability  Tobacco Use   Smoking status: Every Day    Current packs/day: 0.50    Average packs/day: 0.5 packs/day for 37.0 years (  18.5 ttl pk-yrs)    Types: Cigarettes   Smokeless tobacco: Never   Tobacco comments:    10  CIG A DAY  Vaping Use   Vaping status: Never Used  Substance and Sexual Activity   Alcohol use: No    Alcohol/week: 0.0 standard drinks of alcohol   Drug use: No   Sexual activity: Yes    Birth control/protection: Condom    Comment: both  Other Topics Concern   Not on file  Social History Narrative   Not on file   Social Determinants of Health   Financial Resource Strain: Low Risk  (07/08/2023)   Received from [facility]   Overall Financial Resource Strain (CARDIA)    Difficulty of Paying Living Expenses: Not very hard  Recent Concern: Financial Resource Strain - Medium Risk (06/20/2023)   Received from [facility], [facility]   Overall Financial Resource Strain (CARDIA)    Difficulty of Paying Living Expenses: Somewhat hard  Food Insecurity: No Food Insecurity (07/08/2023)   Received from [facility]   Hunger Vital Sign    Worried About Running Out of Food in the Last Year: Never true    Ran Out of Food in the Last Year: Never true  Transportation Needs: No Transportation Needs (07/08/2023)   Received from [facility] - Transportation    Lack of Transportation (Medical): No    Lack of Transportation (Non-Medical): No  Physical Activity: Insufficiently Active (06/20/2023)    [facility] from [facility]   Exercise Vital Sign    Days of Exercise per Week: 2 days    Minutes of Exercise per Session: 20 min  Stress: No Stress Concern Present (06/20/2023)   Received from [facility], [facility] - Occupational Stress Questionnaire    Feeling of Stress : Not at all  Social Connections: Socially Integrated (06/20/2023)   Received from [facility], [facility]   Social Network    How would you rate your social network (family, work, friends)?: Good participation with social networks    Allergies:  Allergies  Allergen Reactions   Asa [Aspirin] Other (See Comments)    Heart flutters   Banana Itching    Metabolic Disorder Labs: Lab Results  Component Value Date   HGBA1C 5.8 (H) 04/02/2023   No results found for: "PROLACTIN" Lab Results  Component Value Date   CHOL 198 04/02/2023   TRIG 194 (H) 04/02/2023   HDL 36 (L) 04/02/2023   CHOLHDL 5.5 (H) 04/02/2023   VLDL 29.8 04/24/2015   LDLCALC 127 (H) 04/02/2023   LDLCALC 89 04/24/2015   Lab Results  Component Value Date   TSH 1.750 04/02/2023   TSH 1.06 05/29/2021    Therapeutic Level Labs: No results found for: "LITHIUM" No results found for: "VALPROATE" No results found for: "CBMZ"  Current Medications: Current Outpatient Medications  Medication Sig Dispense Refill   albuterol (VENTOLIN HFA) 108 (90 Base) MCG/ACT inhaler Inhale 2 puffs into the lungs every 6 (six) hours as needed for wheezing or shortness of breath. (Patient not taking: Reported on 12/24/2022) 3 each 0   AMBULATORY NON FORMULARY MEDICATION Medication Name: GI Cocktail 90 ml 2% viscous lidocaine 90 ml bentyl 10mg /56ml 270 ml Mylanta SIG 10 ml bid prn gas and stomach pain-do not use more than 3 days in a row 600 mL 2   amLODipine (NORVASC) 5 MG tablet TAKE 1 TABLET EVERY DAY (Patient taking differently: TAKE 1  10 mg TABLET EVERY DAY) 90 tablet 1   cyclobenzaprine  (FEXMID) 7.5 MG tablet TAKE 1 TABLET(7.5 MG) BY MOUTH THREE TIMES DAILY AS NEEDED FOR MUSCLE SPASMS 30 tablet 3   diclofenac (VOLTAREN) 75 MG EC tablet TAKE 1 TABLET(75 MG) BY MOUTH TWICE DAILY 60 tablet 2   diclofenac Sodium (VOLTAREN) 1 % GEL APPLY 2 GRAMS TOPICALLY AS NEEDED 100 g 2   dicyclomine (BENTYL) 10 MG capsule Take 10 mg by mouth in the morning, at noon, in the evening, and at bedtime.     gabapentin (NEURONTIN) 400 MG capsule Take 1 capsule (400 mg total) by mouth 3 (three) times daily. 270 capsule 0   hydrochlorothiazide (HYDRODIURIL) 25 MG tablet TAKE 1 TABLET(25 MG) BY MOUTH DAILY 90 tablet 1   hydrOXYzine (ATARAX) 50 MG tablet Take 1 tablet (50 mg total) by mouth 3 (three) times daily as needed. 90 tablet 1   lamoTRIgine (LAMICTAL) 100 MG tablet Take 1 tablet (100 mg total) by mouth daily. 90 tablet 0   linaclotide (LINZESS) 290 MCG CAPS capsule TAKE 1 CAPSULE(290 MCG) BY MOUTH DAILY BEFORE BREAKFAST 90 capsule 3   methylPREDNISolone (MEDROL DOSEPAK) 4 MG TBPK tablet 6 day dose pack - take as directed 21 tablet 0   mirtazapine (REMERON SOL-TAB) 30 MG disintegrating tablet DISSOLVE 1 TABLET(15 MG) ON THE TONGUE AT BEDTIME 90 tablet 0   ondansetron (ZOFRAN-ODT) 8 MG disintegrating tablet Take 1 tablet (8 mg total) by mouth every 8 (eight) hours as needed for nausea or vomiting. 30 tablet 0   pantoprazole (PROTONIX) 40 MG tablet TAKE 1 TABLET(40 MG) BY MOUTH TWICE DAILY 180 tablet 0   prochlorperazine (COMPAZINE) 25 MG suppository Use 1 suppository rectally every 6-8 hours as needed nausea/vomiting 12 suppository 3   QUEtiapine (SEROQUEL XR) 200 MG 24 hr tablet Take 3 tablets (600 mg total) by mouth at bedtime. 90 tablet 1   QUEtiapine (SEROQUEL) 50 MG tablet Take 1 tablet (50 mg total) by mouth at bedtime. 90 tablet 0   sucralfate (CARAFATE) 1 g tablet Take 1 g by mouth with breakfast, with lunch, and with evening meal.     traZODone (DESYREL) 100 MG tablet Take 1 tablet (100 mg  total) by mouth at bedtime as needed for sleep. 30 tablet 1   No current facility-administered medications for this visit.      Psychiatric Specialty Exam: Review of Systems  Neurological:  Negative for headaches.  Psychiatric/Behavioral:  Positive for sleep disturbance. Negative for suicidal ideas. The patient is nervous/anxious.     There were no vitals taken for this visit.There is no height or weight on file to calculate BMI.  General Appearance: Casual  Eye Contact:  Good  Speech:  Clear and Coherent  Volume:  Normal  Mood:  Euthymic  Affect:  Appropriate  Thought Process:  Coherent  Orientation:  Full (Time, Place, and Person)  Thought Content: Logical   Suicidal Thoughts:  No  Homicidal Thoughts:  No  Memory:  Immediate;   Good Recent;   Good  Judgement:  Good  Insight:  Good  Psychomotor Activity:  NA  Concentration:  Concentration: Good  Recall:  NA  Fund of Knowledge: Good  Language: Good  Akathisia:  No  Handed:    AIMS (if indicated): not done  Assets:  Communication Skills Desire for Improvement Housing Leisure Time Resilience Social Support Transportation  ADL's:  Intact  Cognition: WNL  Sleep:  Poor   Screenings: GAD-7  Flowsheet Row Office Visit from 07/25/2022 in [facility] Video Visit from 01/16/2022 in [facility] Video Visit from 10/22/2021 in [facility] Video Visit from 07/16/2021 in [facility]  Total GAD-7 Score 4 2 4 18       PHQ2-9    Flowsheet Row Office Visit from 07/25/2022 in [facility] Office Visit from 07/18/2022 in [facility] Office Visit from 03/27/2022 in [facility] Video Visit from 01/16/2022 in [facility] Video Visit from 10/22/2021 in [facility]  PHQ-2 Total Score 0 0 0 0 1   PHQ-9 Total Score 6 -- -- 1 5      Flowsheet Row ED from 09/09/2022 in [facility] Emergency Department at [facility]  C-SSRS RISK CATEGORY No Risk        Assessment and Plan:  Patient continues to have good insight as to where a lot of her anxiety is coming from, endorsing that her mother's care continues to be a stressor.  At this time do not think it is wise to continue decreased on Seroquel as patient is endorsing increased frequency of panic attacks.  While patient is exercising good judgment by practicing previously learned coping skills when her panic attacks were more severe, there is some concern that they could progress.  We will start patient on propranolol daily as needed and nightly.  Patient endorsed that having a as needed pill could help her practice her coping skills and also gives her a therapeutic sense of comfort in relation to her anxiety.  Patient is also endorsing that she is having increase anxiety at night and is not falling asleep in a timely manner endorsing early insomnia despite being on a high dose of Seroquel.  We will trial nightly dosing of propranolol at this time to to help decrease anxiety.  Patient's last heart rate appears to be in a nonconcerning place to start a beta blocker.  Spoke with pharmacy who clarified that insurance was only willing to pay for tablets that are taking 1x/day. Pharmacist suggested that 600mg  Seroquel XR be written in 400mg  XR at bedtime and 200mg  at bedtime. Pharmacist canceled 200mg  90 tablets order so that this set of order could be sent in.   Mood disorder otherwise specified vs Bipolar d/o Hx GAD [place]iver burden - Continue Lamictal 100 mg daily  - Continue Remeron 30mg  QHS - Continue gabapentin 400 mg 3 times daily - Continue Seroquel 50 mg nightly - Continue Seroquel XR 600mg  (400mg  tablet and 200mg  tablet) - Continue Trazodone  50mg  at bedtime PRN - Start Propanolol 10mg  daily PRN and 10mg  at bedtime - Will reach  out to PCP again to get an EKG, she can do a RN visit  Follow-up in approximately 4-6 weeks   Collaboration of Care: Collaboration of Care:   Patient/Guardian was advised Release of Information must be obtained prior to any record release in order to collaborate their care with an outside provider. Patient/Guardian was advised if they have not already done so to contact the registration department to sign all necessary forms in order for [place] to release information regarding their care.   Consent: Patient/Guardian gives verbal consent for treatment and assignment of benefits for services provided during this visit. Patient/Guardian expressed understanding and agreed to proceed.   PGY-4 [name], MD 09/05/2023, 1:53 PM

## 2023-09-05 NOTE — Addendum Note (Signed)
Addended by: Eliseo Gum B on: 09/05/2023 03:21 PM   Modules accepted: Orders

## 2023-09-07 ENCOUNTER — Emergency Department (HOSPITAL_BASED_OUTPATIENT_CLINIC_OR_DEPARTMENT_OTHER): Admission: EM | Admit: 2023-09-07 | Discharge: 2023-09-07 | Discharge status: Against Medical Advice | Payer: 59

## 2023-09-07 ENCOUNTER — Other Ambulatory Visit: Payer: Self-pay

## 2023-09-07 ENCOUNTER — Emergency Department (HOSPITAL_BASED_OUTPATIENT_CLINIC_OR_DEPARTMENT_OTHER): Payer: 59 | Admitting: Radiology

## 2023-09-07 DIAGNOSIS — B349 Viral infection, unspecified: Secondary | ICD-10-CM | POA: Diagnosis not present

## 2023-09-07 DIAGNOSIS — Z1152 Encounter for screening for COVID-19: Secondary | ICD-10-CM | POA: Insufficient documentation

## 2023-09-07 DIAGNOSIS — Z5329 Procedure and treatment not carried out because of patient's decision for other reasons: Secondary | ICD-10-CM | POA: Insufficient documentation

## 2023-09-07 DIAGNOSIS — R5383 Other fatigue: Secondary | ICD-10-CM | POA: Diagnosis present

## 2023-09-07 LAB — BASIC METABOLIC PANEL
Anion gap: 8 (ref 5–15)
BUN: 11 mg/dL (ref 6–20)
CO2: 28 mmol/L (ref 22–32)
Calcium: 9.3 mg/dL (ref 8.9–10.3)
Chloride: 105 mmol/L (ref 98–111)
Creatinine, Ser: 0.83 mg/dL (ref 0.44–1.00)
GFR, Estimated: >60 mL/min (ref >60)
Glucose, Bld: 87 mg/dL (ref 70–99)
Potassium: 3.8 mmol/L (ref 3.5–5.1)
Sodium: 141 mmol/L (ref 135–145)

## 2023-09-07 LAB — CBC
HCT: 39.9 % (ref 36.0–46.0)
Hemoglobin: 13.3 g/dL (ref 12.0–15.0)
MCH: 27.8 pg (ref 26.0–34.0)
MCHC: 33.3 g/dL (ref 30.0–36.0)
MCV: 83.5 fL (ref 80.0–100.0)
Platelets: 354 10*3/uL (ref 150–400)
RBC: 4.78 MIL/uL (ref 3.87–5.11)
RDW: 14.5 % (ref 11.5–15.5)
WBC: 6.1 10*3/uL (ref 4.0–10.5)
nRBC: 0 % (ref 0.0–0.2)

## 2023-09-07 LAB — RESP PANEL BY RT-PCR (RSV, FLU A&B, COVID)  RVPGX2
Influenza A by PCR: NEGATIVE
Influenza B by PCR: NEGATIVE
Resp Syncytial Virus by PCR: NEGATIVE
SARS Coronavirus 2 by RT PCR: NEGATIVE

## 2023-09-07 NOTE — ED Provider Notes (Addendum)
EMERGENCY DEPARTMENT AT [facility] Provider Note   CSN: [id number] Arrival date & time: 09/07/23  1125     History  Chief Complaint  Patient presents with   Chills   Fatigue    Jasmine Jackson is a 52 y.o. female.  52 year old female to the emergency department for generalized malaise and fatigue.  Reports symptoms for the past 3 days.  No headache, vision changes, has had some congestion, 1 episode of diarrhea.  No chest pain or shortness of breath.  No abdominal pain.  No urinary symptoms.        Home Medications Prior to Admission medications   Medication Sig Start Date End Date Taking? Authorizing Provider  albuterol (VENTOLIN HFA) 108 (90 Base) MCG/ACT inhaler Inhale 2 puffs into the lungs every 6 (six) hours as needed for wheezing or shortness of breath. Patient not taking: Reported on 12/24/2022 09/07/21   Dellia Cloud, MD  AMBULATORY NON FORMULARY MEDICATION Medication Name: GI Cocktail 90 ml 2% viscous lidocaine 90 ml bentyl 10mg /68ml 270 ml Mylanta SIG 10 ml bid prn gas and stomach pain-do not use more than 3 days in a row 07/11/22   Meryl Dare, MD  amLODipine (NORVASC) 5 MG tablet TAKE 1 TABLET EVERY DAY Patient taking differently: TAKE 1 10 mg TABLET EVERY DAY 07/18/22   Willette Cluster, MD  cyclobenzaprine (FEXMID) 7.5 MG tablet TAKE 1 TABLET(7.5 MG) BY MOUTH THREE TIMES DAILY AS NEEDED FOR MUSCLE SPASMS 11/01/22   Willette Cluster, MD  diclofenac (VOLTAREN) 75 MG EC tablet TAKE 1 TABLET(75 MG) BY MOUTH TWICE DAILY 08/14/23   McCaughan, Dia D, DPM  diclofenac Sodium (VOLTAREN) 1 % GEL APPLY 2 GRAMS TOPICALLY AS NEEDED 04/11/22   Dellia Cloud, MD  dicyclomine (BENTYL) 10 MG capsule Take 10 mg by mouth in the morning, at noon, in the evening, and at bedtime.    [provider]  gabapentin (NEURONTIN) 400 MG capsule Take 1 capsule (400 mg total) by mouth 3 (three) times daily. 09/05/23   [name], MD  hydrochlorothiazide (HYDRODIURIL) 25  MG tablet TAKE 1 TABLET(25 MG) BY MOUTH DAILY 01/16/23   Willette Cluster, MD  hydrOXYzine (ATARAX) 50 MG tablet Take 1 tablet (50 mg total) by mouth 3 (three) times daily as needed. 08/18/23   [name], MD  lamoTRIgine (LAMICTAL) 100 MG tablet Take 1 tablet (100 mg total) by mouth daily. 09/05/23   [name], MD  linaclotide (LINZESS) 290 MCG CAPS capsule TAKE 1 CAPSULE(290 MCG) BY MOUTH DAILY BEFORE BREAKFAST 01/27/23   Meryl Dare, MD  methylPREDNISolone (MEDROL DOSEPAK) 4 MG TBPK tablet 6 day dose pack - take as directed 12/26/22   Edwin Cap, DPM  mirtazapine (REMERON SOL-TAB) 30 MG disintegrating tablet DISSOLVE 1 TABLET(15 MG) ON THE TONGUE AT BEDTIME 09/05/23   [name], MD  ondansetron (ZOFRAN-ODT) 8 MG disintegrating tablet Take 1 tablet (8 mg total) by mouth every 8 (eight) hours as needed for nausea or vomiting. 02/06/22   Dellia Cloud, MD  pantoprazole (PROTONIX) 40 MG tablet TAKE 1 TABLET(40 MG) BY MOUTH TWICE DAILY 12/10/22   Meryl Dare, MD  prochlorperazine (COMPAZINE) 25 MG suppository Use 1 suppository rectally every 6-8 hours as needed nausea/vomiting 08/20/21   Esterwood, Amy S, PA-C  propranolol (INDERAL) 10 MG tablet Take 1 tablet (10 mg total) by mouth 2 (two) times daily. Take 1 tablet daily as needed and 1 tablet nightly 09/05/23   McQuilla,  Velna Hatchet, MD  QUEtiapine (SEROQUEL XR) 200 MG 24 hr tablet Take 1 tablet (200 mg total) by mouth at bedtime. Take with Seroquel XR 400mg  tablet 09/05/23   [name], MD  QUEtiapine (SEROQUEL XR) 400 MG 24 hr tablet Take 1 tablet (400 mg total) by mouth at bedtime. Take with Seroquel XR 200mg  tablet. 09/05/23   [name], MD  QUEtiapine (SEROQUEL) 50 MG tablet Take 1 tablet (50 mg total) by mouth at bedtime. 09/05/23 09/04/24  [name], MD  sucralfate (CARAFATE) 1 g tablet Take 1 g by mouth with breakfast, with lunch, and with evening meal.    [provider]  traZODone (DESYREL) 100 MG tablet  Take 1 tablet (100 mg total) by mouth at bedtime as needed for sleep. 09/05/23   [name], MD      Allergies    Asa [aspirin] and Banana    Review of Systems   Review of Systems  Physical Exam Updated Vital Signs BP 135/75   Pulse 67   Temp 98.3 F (36.8 C) (Oral)   Resp 16   Ht 5\' 6"  (1.676 m)   Wt 96.6 kg   SpO2 100%   BMI 34.38 kg/m  Physical Exam Vitals and nursing note reviewed.  HENT:     Head: Normocephalic.     Nose: Nose normal.     Mouth/Throat:     Mouth: Mucous membranes are moist.  Eyes:     Conjunctiva/sclera: Conjunctivae normal.  Cardiovascular:     Rate and Rhythm: Normal rate and regular rhythm.  Pulmonary:     Effort: Pulmonary effort is normal.     Breath sounds: Normal breath sounds.  Abdominal:     General: Abdomen is flat. There is no distension.     Tenderness: There is no abdominal tenderness. There is no guarding or rebound.  Musculoskeletal:        General: Normal range of motion.  Skin:    General: Skin is warm.     Capillary Refill: Capillary refill takes less than 2 seconds.  Neurological:     Mental Status: She is alert and oriented to person, place, and time.  Psychiatric:        Mood and Affect: Mood normal.        Behavior: Behavior normal.     ED Results / Procedures / Treatments   Labs (all labs ordered are listed, but only abnormal results are displayed) Labs Reviewed  RESP PANEL BY RT-PCR (RSV, FLU A&B, COVID)  [id number]  BASIC METABOLIC PANEL  CBC  URINALYSIS, ROUTINE W REFLEX MICROSCOPIC    EKG None  Radiology DG Chest 2 View  Result Date: 09/07/2023 CLINICAL DATA:  Cough, chills and fatigue 3 days. EXAM: CHEST - 2 VIEW COMPARISON:  10/16/2019 FINDINGS: Lungs are clear. Cardiomediastinal silhouette and remainder of the exam is unchanged. IMPRESSION: No active cardiopulmonary disease. Electronically Signed   By: Elberta Fortis M.D.   On: 09/07/2023 12:41    Procedures Procedures    Medications Ordered in  ED Medications - No data to display  ED Course/ Medical Decision Making/ A&P Clinical Course as of 09/07/23 1438  Sun Sep 07, 2023  1257 Patient is requesting to leave so she can make it to a funeral.  The workup was not completed.  Discussed risks of her leaving to include electrolyte abnormalities leading to cardiac arrhythmia, renal injury, possible UTI leading to overt sepsis, or permanent disability and death.  Patient  is alert and orient x 3 in my clinical opinion has capacity to leave.  She understands risks and voiced them.  Has an as best option she stated that she will follow-up with her primary doctor tomorrow and will review her results of BMP at that time.  She will be leaving AGAINST MEDICAL ADVICE. [TY]    Clinical Course User Index [TY] Coral Spikes, DO                                 Medical Decision Making 52 year old female to the emergency department for fatigue, chills and subjective fevers.  Physical exam reassuring no overt bacterial source of infection.  No localizing neurodeficits.  Will get broad workup to evaluate for possible infectious cause.  See ED course for further MDM disposition.  Amount and/or Complexity of Data Reviewed External Data Reviewed:     Details: Per chart review has a history of hypertension, hyperlipidemia, and some psych issues per PCP notes. Labs: ordered. Radiology: ordered.   Flu/COVID ordered given patient's complaint of generalized malaise which negative.       Final Clinical Impression(s) / ED Diagnoses Final diagnoses:  Viral syndrome    Rx / DC Orders ED Discharge Orders     None         Coral Spikes, DO 09/07/23 1438    Coral Spikes, DO 09/16/23 1555

## 2023-09-07 NOTE — Discharge Instructions (Signed)
You are choosing to leave AGAINST MEDICAL ADVICE.  Please follow-up with your primary doctor as soon as possible as we discussed.  Return immediately if you would like reevaluation or you develop any new or worsening symptoms that are concerning to you.

## 2023-09-07 NOTE — ED Triage Notes (Signed)
Patient arrives with complaints of chills and fatigue x3 days. Took OTC meds at home with minimal relief. No known sick contacts.

## 2023-09-07 NOTE — ED Notes (Signed)
She is quite pleased with her care. She states she must leave now to attend a funeral. I remove her IV and she leaves our department.

## 2023-09-22 ENCOUNTER — Encounter (HOSPITAL_BASED_OUTPATIENT_CLINIC_OR_DEPARTMENT_OTHER): Payer: Self-pay | Admitting: Emergency Medicine

## 2023-09-22 ENCOUNTER — Emergency Department (HOSPITAL_BASED_OUTPATIENT_CLINIC_OR_DEPARTMENT_OTHER): Payer: 59 | Admitting: Radiology

## 2023-09-22 ENCOUNTER — Other Ambulatory Visit: Payer: Self-pay

## 2023-09-22 ENCOUNTER — Emergency Department (HOSPITAL_BASED_OUTPATIENT_CLINIC_OR_DEPARTMENT_OTHER)
Admission: EM | Admit: 2023-09-22 | Discharge: 2023-09-22 | Disposition: A | Discharge status: Home / Self Care | Payer: 59 | Attending: Emergency Medicine | Admitting: Emergency Medicine

## 2023-09-22 DIAGNOSIS — I10 Essential (primary) hypertension: Secondary | ICD-10-CM | POA: Insufficient documentation

## 2023-09-22 DIAGNOSIS — R0789 Other chest pain: Secondary | ICD-10-CM | POA: Diagnosis present

## 2023-09-22 DIAGNOSIS — R0602 Shortness of breath: Secondary | ICD-10-CM | POA: Diagnosis not present

## 2023-09-22 DIAGNOSIS — Z79899 Other long term (current) drug therapy: Secondary | ICD-10-CM | POA: Diagnosis not present

## 2023-09-22 LAB — CBC
HCT: 42.7 % (ref 36.0–46.0)
Hemoglobin: 14.4 g/dL (ref 12.0–15.0)
MCH: 28 pg (ref 26.0–34.0)
MCHC: 33.7 g/dL (ref 30.0–36.0)
MCV: 82.9 fL (ref 80.0–100.0)
Platelets: 326 10*3/uL (ref 150–400)
RBC: 5.15 MIL/uL — ABNORMAL HIGH (ref 3.87–5.11)
RDW: 14.1 % (ref 11.5–15.5)
WBC: 6.1 10*3/uL (ref 4.0–10.5)
nRBC: 0 % (ref 0.0–0.2)

## 2023-09-22 LAB — BASIC METABOLIC PANEL
Anion gap: 8 (ref 5–15)
BUN: 17 mg/dL (ref 6–20)
CO2: 27 mmol/L (ref 22–32)
Calcium: 10.2 mg/dL (ref 8.9–10.3)
Chloride: 103 mmol/L (ref 98–111)
Creatinine, Ser: 0.8 mg/dL (ref 0.44–1.00)
GFR, Estimated: >60 mL/min (ref >60)
Glucose, Bld: 90 mg/dL (ref 70–99)
Potassium: 4.5 mmol/L (ref 3.5–5.1)
Sodium: 138 mmol/L (ref 135–145)

## 2023-09-22 LAB — TROPONIN I (HIGH SENSITIVITY)
Troponin I (High Sensitivity): <2 ng/L (ref <18)
Troponin I (High Sensitivity): <2 ng/L (ref <18)

## 2023-09-22 LAB — D-DIMER, QUANTITATIVE: D-Dimer, Quant: <0.27 ug{FEU}/mL (ref 0.00–0.50)

## 2023-09-22 MED ORDER — ASPIRIN 81 MG PO CHEW
324.0000 mg | CHEWABLE_TABLET | Freq: Once | ORAL | Status: AC
  Administered 2023-09-22: 324 mg via ORAL
  Filled 2023-09-22: qty 4

## 2023-09-22 NOTE — ED Triage Notes (Signed)
Chest pains started last night, started left side last about 1 hour relieved without intervention.  Today some pain on the right side. Endorses increased stress at home/ work Some sob

## 2023-09-22 NOTE — Discharge Instructions (Addendum)
You have been seen today for your complaint of chest pain. Your lab work was reassuring. Your imaging was reassuring. Follow up with: Cardiology.  They should call you within the next 3 business days to schedule an appointment Please seek immediate medical care if you develop any of the following symptoms: Your chest pain gets worse. You have a cough that gets worse, or you cough up blood. You have severe pain in your abdomen. You faint. You have sudden, unexplained chest discomfort. You have sudden, unexplained discomfort in your arms, back, neck, or jaw. You have shortness of breath at any time. You suddenly start to sweat, or your skin gets clammy. You feel nausea or you vomit. You suddenly feel lightheaded or dizzy. You have severe weakness, or unexplained weakness or fatigue. Your heart begins to beat quickly, or it feels like it is skipping beats. At this time there does not appear to be the presence of an emergent medical condition, however there is always the potential for conditions to change. Please read and follow the below instructions.  Do not take your medicine if  develop an itchy rash, swelling in your mouth or lips, or difficulty breathing; call 911 and seek immediate emergency medical attention if this occurs.  You may review your lab tests and imaging results in their entirety on your MyChart account.  Please discuss all results of fully with your primary care provider and other specialist at your follow-up visit.  Note: Portions of this text may have been transcribed using voice recognition software. Every effort was made to ensure accuracy; however, inadvertent computerized transcription errors may still be present.

## 2023-09-22 NOTE — ED Provider Notes (Signed)
[place] EMERGENCY DEPARTMENT AT [facility] Provider Note   CSN: [id number] Arrival date & time: 09/22/23  1541     History  Chief Complaint  Patient presents with   Chest Pain    Jasmine Jackson is a 52 y.o. female.  With a history of panic disorder, anxiety, depression, bipolar 1 disorder, anemia, heart murmur, hypertension presenting to the ED for evaluation of chest pain.  She states she noticed this pain 2 days ago when she woke up.  Pain at that time was localized to the left upper chest with some radiation down the left arm.  She states she woke up yesterday and noticed that the pain is now localized to the right upper chest without radiation.  It is described as an aching pain at this time.  Pain is not exertional.  She did have some shortness of breath which was improved with her albuterol inhaler.  No associated nausea, vomiting or diaphoresis.  No history of CAD.  No family history of CAD.  No hemoptysis, history of DVT or PE, unilateral leg swelling, recent cancer treatments, surgeries, long distance travel, estrogen use.  She reports her mother is suffering from vascular dementia and has had some decline lately which has required input from [facility] [employer].  She reports significant stress from this and believes this is contributing to her symptoms.   Chest Pain      Home Medications Prior to Admission medications   Medication Sig Start Date End Date Taking? Authorizing Provider  albuterol (VENTOLIN HFA) 108 (90 Base) MCG/ACT inhaler Inhale 2 puffs into the lungs every 6 (six) hours as needed for wheezing or shortness of breath. Patient not taking: Reported on 12/24/2022 09/07/21   Dellia Cloud, MD  AMBULATORY NON FORMULARY MEDICATION Medication Name: GI Cocktail 90 ml 2% viscous lidocaine 90 ml bentyl 10mg /64ml 270 ml Mylanta SIG 10 ml bid prn gas and stomach pain-do not use more than 3 days in a row 07/11/22   Meryl Dare, MD  amLODipine (NORVASC) 5  MG tablet TAKE 1 TABLET EVERY DAY Patient taking differently: TAKE 1 10 mg TABLET EVERY DAY 07/18/22   Willette Cluster, MD  cyclobenzaprine (FEXMID) 7.5 MG tablet TAKE 1 TABLET(7.5 MG) BY MOUTH THREE TIMES DAILY AS NEEDED FOR MUSCLE SPASMS 11/01/22   Willette Cluster, MD  diclofenac (VOLTAREN) 75 MG EC tablet TAKE 1 TABLET(75 MG) BY MOUTH TWICE DAILY 08/14/23   McCaughan, Dia D, DPM  diclofenac Sodium (VOLTAREN) 1 % GEL APPLY 2 GRAMS TOPICALLY AS NEEDED 04/11/22   Dellia Cloud, MD  dicyclomine (BENTYL) 10 MG capsule Take 10 mg by mouth in the morning, at noon, in the evening, and at bedtime.    [provider]  gabapentin (NEURONTIN) 400 MG capsule Take 1 capsule (400 mg total) by mouth 3 (three) times daily. 09/05/23   [name], MD  hydrochlorothiazide (HYDRODIURIL) 25 MG tablet TAKE 1 TABLET(25 MG) BY MOUTH DAILY 01/16/23   Willette Cluster, MD  hydrOXYzine (ATARAX) 50 MG tablet Take 1 tablet (50 mg total) by mouth 3 (three) times daily as needed. 08/18/23   [name], MD  lamoTRIgine (LAMICTAL) 100 MG tablet Take 1 tablet (100 mg total) by mouth daily. 09/05/23   [name], MD  linaclotide (LINZESS) 290 MCG CAPS capsule TAKE 1 CAPSULE(290 MCG) BY MOUTH DAILY BEFORE BREAKFAST 01/27/23   Meryl Dare, MD  methylPREDNISolone (MEDROL DOSEPAK) 4 MG TBPK tablet 6 day dose pack - take as  directed 12/26/22   Edwin Cap, DPM  mirtazapine (REMERON SOL-TAB) 30 MG disintegrating tablet DISSOLVE 1 TABLET(15 MG) ON THE TONGUE AT BEDTIME 09/05/23   [name], MD  ondansetron (ZOFRAN-ODT) 8 MG disintegrating tablet Take 1 tablet (8 mg total) by mouth every 8 (eight) hours as needed for nausea or vomiting. 02/06/22   Dellia Cloud, MD  pantoprazole (PROTONIX) 40 MG tablet TAKE 1 TABLET(40 MG) BY MOUTH TWICE DAILY 12/10/22   Meryl Dare, MD  prochlorperazine (COMPAZINE) 25 MG suppository Use 1 suppository rectally every 6-8 hours as needed nausea/vomiting 08/20/21   Esterwood, Amy S, PA-C   propranolol (INDERAL) 10 MG tablet Take 1 tablet (10 mg total) by mouth 2 (two) times daily. Take 1 tablet daily as needed and 1 tablet nightly 09/05/23   Eliseo Gum B, MD  QUEtiapine (SEROQUEL XR) 200 MG 24 hr tablet Take 1 tablet (200 mg total) by mouth at bedtime. Take with Seroquel XR 400mg  tablet 09/05/23   [name], MD  QUEtiapine (SEROQUEL XR) 400 MG 24 hr tablet Take 1 tablet (400 mg total) by mouth at bedtime. Take with Seroquel XR 200mg  tablet. 09/05/23   [name], MD  QUEtiapine (SEROQUEL) 50 MG tablet Take 1 tablet (50 mg total) by mouth at bedtime. 09/05/23 09/04/24  [name], MD  sucralfate (CARAFATE) 1 g tablet Take 1 g by mouth with breakfast, with lunch, and with evening meal.    [provider]  traZODone (DESYREL) 100 MG tablet Take 1 tablet (100 mg total) by mouth at bedtime as needed for sleep. 09/05/23   [name], MD      Allergies    Asa [aspirin] and Banana    Review of Systems   Review of Systems  Cardiovascular:  Positive for chest pain.  All other systems reviewed and are negative.   Physical Exam Updated Vital Signs BP (!) 138/93   Pulse 77   Temp 98.5 F (36.9 C) (Oral)   Resp 18   SpO2 100%  Physical Exam Vitals and nursing note reviewed.  Constitutional:      General: She is not in acute distress.    Appearance: She is well-developed.     Comments: Resting comfortably in bed  HENT:     Head: Normocephalic and atraumatic.  Eyes:     Conjunctiva/sclera: Conjunctivae normal.  Cardiovascular:     Rate and Rhythm: Normal rate and regular rhythm.     Heart sounds: No murmur heard. Pulmonary:     Effort: Pulmonary effort is normal. No respiratory distress.     Breath sounds: Normal breath sounds. No decreased breath sounds, wheezing, rhonchi or rales.  Abdominal:     Palpations: Abdomen is soft.     Tenderness: There is no abdominal tenderness.  Musculoskeletal:        General: No swelling.     Cervical back:  Neck supple.     Right lower leg: No edema.     Left lower leg: No edema.  Skin:    General: Skin is warm and dry.     Capillary Refill: Capillary refill takes less than 2 seconds.  Neurological:     Mental Status: She is alert.  Psychiatric:        Mood and Affect: Mood normal.     ED Results / Procedures / Treatments   Labs (all labs ordered are listed, but only abnormal results are displayed) Labs Reviewed  CBC - Abnormal; Notable  for the following components:      Result Value   RBC 5.15 (*)    All other components within normal limits  BASIC METABOLIC PANEL  D-DIMER, QUANTITATIVE  TROPONIN I (HIGH SENSITIVITY)  TROPONIN I (HIGH SENSITIVITY)    EKG None  Radiology DG Chest 2 View  Result Date: 09/22/2023 CLINICAL DATA:  Chest pain and shortness of breath. EXAM: CHEST - 2 VIEW COMPARISON:  09/07/2023 FINDINGS: The cardiomediastinal contours are normal. The lungs are clear. Pulmonary vasculature is normal. No consolidation, pleural effusion, or pneumothorax. No acute osseous abnormalities are seen. IMPRESSION: Negative radiographs of the chest. Electronically Signed   By: Narda Rutherford M.D.   On: 09/22/2023 19:16    Procedures Procedures    Medications Ordered in ED Medications  aspirin chewable tablet 324 mg (324 mg Oral Given 09/22/23 1846)    ED Course/ Medical Decision Making/ A&P             HEART Score: 2                    Medical Decision Making Amount and/or Complexity of Data Reviewed Labs: ordered. Radiology: ordered.  Risk OTC drugs.   This patient presents to the ED for concern of chest pain, this involves an extensive number of treatment options, and is a complaint that carries with it a high risk of complications and morbidity. The emergent differential diagnosis of chest pain includes: Acute coronary syndrome, pericarditis, aortic dissection, pulmonary embolism, tension pneumothorax, and esophageal rupture.  I do not believe the patient  has an emergent cause of chest pain, other urgent/non-acute considerations include, but are not limited to: chronic angina, aortic stenosis, cardiomyopathy, myocarditis, mitral valve prolapse, pulmonary hypertension, hypertrophic obstructive cardiomyopathy (HOCM), aortic insufficiency, right ventricular hypertrophy, pneumonia, pleuritis, bronchitis, pneumothorax, tumor, gastroesophageal reflux disease (GERD), esophageal spasm, Mallory-Weiss syndrome, peptic ulcer disease, biliary disease, pancreatitis, functional gastrointestinal pain, cervical or thoracic disk disease or arthritis, shoulder arthritis, costochondritis, subacromial bursitis, anxiety or panic attack, herpes zoster, breast disorders, chest wall tumors, thoracic outlet syndrome, mediastinitis.  My initial workup includes ACS rule out, aspirin, D-dimer  Additional history obtained from: Nursing notes from this visit.  I ordered, reviewed and interpreted labs which include: CBC, BMP, troponin, delta troponin, D-dimer.  Lab workup reassuring.  Initial delta troponin negative.  D-dimer negative.  I ordered imaging studies including chest x-ray I independently visualized and interpreted imaging which showed negative. I agree with the radiologist interpretation  Cardiac Monitoring:  The patient was maintained on a cardiac monitor.  I personally viewed and interpreted the cardiac monitored which showed an underlying rhythm of: NSR  Afebrile, hemodynamically stable.  52 year old female presenting for evaluation of chest pain.  This began on the left a few days ago.  Pain is now on the right side of the chest.  She believes this pain may be secondary to significant anxiety she is experiencing right now.  EKG without ischemic changes.  Chest x-ray negative.  Initial and delta troponin negative.  D-dimer negative.  Very low suspicion for ACS/PE as the cause of her symptoms.  There is some tenderness to palpation of the chest.  Question  musculoskeletal pain versus anxiety driven pain.  She was encouraged to follow-up with cardiology and an ambulatory referral was placed.  She was given return precautions.  Stable at discharge.  At this time there does not appear to be any evidence of an acute emergency medical condition and the patient appears stable for  discharge with appropriate outpatient follow up. Diagnosis was discussed with patient who verbalizes understanding of care plan and is agreeable to discharge. I have discussed return precautions with patient who verbalizes understanding. Patient encouraged to follow-up with their PCP within 1 week. All questions answered.  Note: Portions of this report may have been transcribed using voice recognition software. Every effort was made to ensure accuracy; however, inadvertent computerized transcription errors may still be present.        Final Clinical Impression(s) / ED Diagnoses Final diagnoses:  Atypical chest pain    Rx / DC Orders ED Discharge Orders          Ordered    Ambulatory referral to Cardiology       Comments: If you have not heard from the Cardiology office within the next 72 hours please call [phone].   09/22/23 1935              Michelle Piper, Cordelia Poche 09/22/23 1936    Alvira Monday, MD 09/24/23 1330

## 2023-10-10 ENCOUNTER — Telehealth (HOSPITAL_COMMUNITY): Payer: 59 | Admitting: Student in an Organized Health Care Education/Training Program

## 2023-10-10 ENCOUNTER — Encounter (HOSPITAL_COMMUNITY): Payer: Self-pay

## 2023-10-10 ENCOUNTER — Other Ambulatory Visit (HOSPITAL_COMMUNITY): Payer: Self-pay | Admitting: Student in an Organized Health Care Education/Training Program

## 2023-10-10 ENCOUNTER — Telehealth (HOSPITAL_COMMUNITY): Payer: Self-pay | Admitting: Student in an Organized Health Care Education/Training Program

## 2023-10-10 DIAGNOSIS — F419 Anxiety disorder, unspecified: Secondary | ICD-10-CM

## 2023-10-10 DIAGNOSIS — G4700 Insomnia, unspecified: Secondary | ICD-10-CM

## 2023-10-10 DIAGNOSIS — F3170 Bipolar disorder, currently in remission, most recent episode unspecified: Secondary | ICD-10-CM

## 2023-10-10 DIAGNOSIS — Z636 Dependent relative needing care at home: Secondary | ICD-10-CM

## 2023-10-10 DIAGNOSIS — F39 Unspecified mood [affective] disorder: Secondary | ICD-10-CM

## 2023-10-10 MED ORDER — QUETIAPINE FUMARATE ER 200 MG PO TB24
200.0000 mg | ORAL_TABLET | Freq: Every day | ORAL | 0 refills | Status: DC
Start: 2023-10-10 — End: 2023-10-21

## 2023-10-10 MED ORDER — QUETIAPINE FUMARATE ER 400 MG PO TB24
400.0000 mg | ORAL_TABLET | Freq: Every day | ORAL | 0 refills | Status: DC
Start: 2023-10-10 — End: 2023-10-21

## 2023-10-10 NOTE — Telephone Encounter (Signed)
I will only refill the Seroquel XR 400mg  and 20mg  as all of patient's other medications were written for 90 day supply or had a refill. Thanks!

## 2023-10-10 NOTE — Progress Notes (Unsigned)
Attempted to call 3x, no answer and sent a message via SMS for video visit, no response. Patient will be marked a no show.

## 2023-10-12 ENCOUNTER — Other Ambulatory Visit: Payer: Self-pay | Admitting: Gastroenterology

## 2023-10-20 ENCOUNTER — Other Ambulatory Visit: Payer: Self-pay | Admitting: Podiatry

## 2023-10-21 ENCOUNTER — Other Ambulatory Visit: Payer: Self-pay | Admitting: Gastroenterology

## 2023-10-21 ENCOUNTER — Telehealth (HOSPITAL_COMMUNITY): Payer: Self-pay

## 2023-10-21 ENCOUNTER — Other Ambulatory Visit (HOSPITAL_COMMUNITY): Payer: Self-pay | Admitting: Student in an Organized Health Care Education/Training Program

## 2023-10-21 DIAGNOSIS — F39 Unspecified mood [affective] disorder: Secondary | ICD-10-CM

## 2023-10-21 DIAGNOSIS — F3170 Bipolar disorder, currently in remission, most recent episode unspecified: Secondary | ICD-10-CM

## 2023-10-21 MED ORDER — QUETIAPINE FUMARATE ER 400 MG PO TB24
400.0000 mg | ORAL_TABLET | Freq: Every day | ORAL | 0 refills | Status: DC
Start: 2023-10-21 — End: 2023-10-24

## 2023-10-21 MED ORDER — QUETIAPINE FUMARATE ER 200 MG PO TB24
200.0000 mg | ORAL_TABLET | Freq: Every day | ORAL | 0 refills | Status: DC
Start: 2023-10-21 — End: 2023-10-24

## 2023-10-24 ENCOUNTER — Telehealth (INDEPENDENT_AMBULATORY_CARE_PROVIDER_SITE_OTHER): Payer: 59 | Admitting: Student in an Organized Health Care Education/Training Program

## 2023-10-24 ENCOUNTER — Encounter (HOSPITAL_COMMUNITY): Payer: Self-pay | Admitting: Student in an Organized Health Care Education/Training Program

## 2023-10-24 DIAGNOSIS — F3170 Bipolar disorder, currently in remission, most recent episode unspecified: Secondary | ICD-10-CM

## 2023-10-24 DIAGNOSIS — F39 Unspecified mood [affective] disorder: Secondary | ICD-10-CM | POA: Diagnosis not present

## 2023-10-24 DIAGNOSIS — G4700 Insomnia, unspecified: Secondary | ICD-10-CM | POA: Diagnosis not present

## 2023-10-24 DIAGNOSIS — F419 Anxiety disorder, unspecified: Secondary | ICD-10-CM | POA: Diagnosis not present

## 2023-10-24 MED ORDER — MIRTAZAPINE 30 MG PO TBDP: ORAL_TABLET | ORAL | 0 refills | Status: DC

## 2023-10-24 MED ORDER — LAMOTRIGINE 100 MG PO TABS
100.0000 mg | ORAL_TABLET | Freq: Every day | ORAL | 0 refills | Status: DC
Start: 2023-10-24 — End: 2023-12-12

## 2023-10-24 MED ORDER — QUETIAPINE FUMARATE 50 MG PO TABS
50.0000 mg | ORAL_TABLET | Freq: Every day | ORAL | 0 refills | Status: DC
Start: 2023-10-24 — End: 2023-12-12

## 2023-10-24 MED ORDER — QUETIAPINE FUMARATE ER 400 MG PO TB24
400.0000 mg | ORAL_TABLET | Freq: Every day | ORAL | 0 refills | Status: DC
Start: 2023-10-24 — End: 2023-12-12

## 2023-10-24 MED ORDER — HYDROXYZINE HCL 50 MG PO TABS
50.0000 mg | ORAL_TABLET | Freq: Three times a day (TID) | ORAL | 0 refills | Status: DC | PRN
Start: 2023-10-24 — End: 2023-12-12

## 2023-10-24 MED ORDER — TRAZODONE HCL 100 MG PO TABS
100.0000 mg | ORAL_TABLET | Freq: Every evening | ORAL | 0 refills | Status: DC | PRN
Start: 2023-10-24 — End: 2023-12-12

## 2023-10-24 MED ORDER — GABAPENTIN 400 MG PO CAPS
400.0000 mg | ORAL_CAPSULE | Freq: Three times a day (TID) | ORAL | 0 refills | Status: DC
Start: 2023-10-24 — End: 2023-12-12

## 2023-10-24 NOTE — Addendum Note (Signed)
Addended by: Eliseo Gum B on: 10/24/2023 11:01 AM   Modules accepted: Level of Service

## 2023-10-24 NOTE — Progress Notes (Cosign Needed Addendum)
Virtual Visit via Video Note  I connected with Jasmine Jackson on 10/24/23 at  9:00 AM EDT by a video enabled telemedicine application and verified that I am speaking with the correct person using two identifiers.  Location: Patient: Home Provider: Office   I discussed the limitations of evaluation and management by telemedicine and the availability of in person appointments. The patient expressed understanding and agreed to proceed.     I discussed the assessment and treatment plan with the patient. The patient was provided an opportunity to ask questions and all were answered. The patient agreed with the plan and demonstrated an understanding of the instructions.   The patient was advised to call back or seek an in-person evaluation if the symptoms worsen or if the condition fails to improve as anticipated.  I provided 25 minutes of non-face-to-face time during this encounter.   [name], MD  [facility] MD/PA/NP OP Progress Note  10/24/2023 10:58 AM Jasmine Jackson  MRN:  [id number]  Chief Complaint:  Chief Complaint  Patient presents with   Follow-up   HPI: Jasmine Jackson is a 52 yo with PPH unspecified mood disorder, PTSD, anxiety, panic attacks, and depression.  Patient reports she has been compliant with the following medication regimen:   -  Lamictal 100 mg daily  -  Remeron 30mg  QHS -  gabapentin 400 mg 3 times daily -  Seroquel 50 mg nightly -  Seroquel 600mg  XR -  Trazodone  50mg  at bedtime (taking nightly) - Hydroxyzine 50mg  TID (2x/ day most often) - Propanolol 10mg  daily PRN and 10mg  at bedtime  rx chantix by PCP   Patient reports that her mom is visiting her, but is living on her own. Patient reports that she is taking the Propanolol BID. Patient reports that she is doing "pretty good." Patient reports that her anxiety attacks have decreased in comparison. Patient reports that her grandmother is currently dying and her Aunt  died. Patient reports that she still  feels that given the situation she feels that she is doing ok. Patient reports that she is worried about her concentration, she feels like she is having to put forth a lot of effort to focus. Patient reports that it is taking her what she feels is 3x as long to get things done.   Patient reports that to relax she sits outside , gardening, and cutting grass but with this season, she has not been able to do this. Patient reports that she used to smoke but she is not doing this anymore. Patient reports that she is interested in going to the gym, and knows her insurance would pay for it.Patient reports that she has been sleeping ok, but is waking up 2x/ night. Patient reports that she does feel like her thoughts are waking her up. Patient reports that she still does not feel well rested, and again endorses that she feels that this due to her current stressors. Patient reports that she is eating well. Patient denies SI, HI, and AVH. Patient denies delusional thoughts.   Patient denies having adverse side effects with her Propanolol.    Visit Diagnosis:    ICD-10-CM   1. Unspecified mood (affective) disorder (HCC)  F39 lamoTRIgine (LAMICTAL) 100 MG tablet    QUEtiapine (SEROQUEL) 50 MG tablet    QUEtiapine (SEROQUEL XR) 400 MG 24 hr tablet    gabapentin (NEURONTIN) 400 MG capsule    2. Anxiety  F41.9 mirtazapine (REMERON SOL-TAB) 30 MG disintegrating tablet  hydrOXYzine (ATARAX) 50 MG tablet    gabapentin (NEURONTIN) 400 MG capsule    3. Bipolar disorder in full remission, most recent episode unspecified type (HCC)  F31.70 QUEtiapine (SEROQUEL) 50 MG tablet    4. Insomnia, unspecified type  G47.00 traZODone (DESYREL) 100 MG tablet        Past Psychiatric History: Last visit 09/2022-concern for extremely high dose of Seroquel and adverse side effects due to this, decrease Seroquel XR to 300 mg then 200 mg. Patient called later endorsing problems with sleep, started patient on trazodone 25 to  50 mg nightly as needed   02/2023-continue decrease of Seroquel.  Seroquel decreased from 200 mg to 100 mg and continued Seroquel XR 400 mg.  Patient endorses on arrival that she had been taking Seroquel XR 400 and had decreased the regular Seroquel to the previously mentioned 200.  Also concern that patient does not appear to recall a true manic episode however patient was very nervous about titrating down his Seroquel.  Discussion had with patient about concern for polypharmacy and adverse metabolic side effects especially from being on Seroquel. 03/2023- Mood stable, continues to appear in remission.  Labs also indicated that patient was prediabetic and had hyperlipidemia, patient was okay with continuing down on Seroquel after discussion that this medication could increase or worsen risk for these diseases. 07/2023- Patient rx of Seroquel were old and patient was again taking higher doses and not the medication changes. Patient had to be decreased back to Seroquel XR 600mg  at bedtime and Serqoeul 50mg  at bedtime. Continued Lamictal and remeron as they were. Patient had some stressors about her mother's health, but otherwise had a fair mood and no other anxiety outside of her mother's health.  08/2023- Patient endorsing more panic attacks, but not having to call EMS as in the past, started on Propanolol 10mg  daily PRN and 10mg  at bedtime as her anxiety is keeping her up at night. Patient trying to use coping skills as well. No other medication adjustments made.  Past Medical History:  Past Medical History:  Diagnosis Date   Anemia    06/19/21-pt has no recollection of this dx   Anxiety    Anxiety and depression    Asthma    Bipolar 1 disorder (HCC)    Depression    Excessive daytime sleepiness 11/18/2017   GERD (gastroesophageal reflux disease)    Heart murmur    Hemorrhoids    Hiatal hernia    Hypertension    Internal hemorrhoids    Irritable bowel disease 2014   Toe injury 11/18/2017     Past Surgical History:  Procedure Laterality Date   COLONOSCOPY  2021   HEMORRHOID SURGERY     INGUINAL HERNIA REPAIR     PARTIAL HYSTERECTOMY     fibroids   TONSILLECTOMY     UPPER GASTROINTESTINAL ENDOSCOPY      Family Psychiatric History: Son-autism    Family History:  Family History  Problem Relation Age of Onset   Breast cancer Maternal Aunt        x4   Stomach cancer Maternal Aunt    Prostate cancer Maternal Uncle        x2   Breast cancer Maternal Grandmother    Breast cancer Mother 56   Colon polyps Mother 63   Breast cancer Paternal Grandmother    Colon cancer Neg Hx    Esophageal cancer Neg Hx    Rectal cancer Neg Hx     Social History:  Social History   Socioeconomic History   Marital status: Single    Spouse name: Not on file   Number of children: 1   Years of education: Not on file   Highest education level: Not on file  Occupational History   Occupation: Disability  Tobacco Use   Smoking status: Every Day    Current packs/day: 0.50    Average packs/day: 0.5 packs/day for 37.0 years (18.5 ttl pk-yrs)    Types: Cigarettes   Smokeless tobacco: Never   Tobacco comments:    10  CIG A DAY  Vaping Use   Vaping status: Never Used  Substance and Sexual Activity   Alcohol use: No    Alcohol/week: 0.0 standard drinks of alcohol   Drug use: No   Sexual activity: Yes    Birth control/protection: Condom    Comment: both  Other Topics Concern   Not on file  Social History Narrative   Not on file   Social Determinants of Health   Financial Resource Strain: Low Risk  (07/08/2023)   Received from [facility]   Overall Financial Resource Strain (CARDIA)    Difficulty of Paying Living Expenses: Not very hard  Recent Concern: Financial Resource Strain - Medium Risk (06/20/2023)   Received from [facility]   Overall Financial Resource Strain (CARDIA)    Difficulty of Paying Living Expenses: Somewhat hard  Food Insecurity: No Food  Insecurity (07/08/2023)   Received from [facility]   Hunger Vital Sign    Worried About Running Out of Food in the Last Year: Never true    Ran Out of Food in the Last Year: Never true  Transportation Needs: No Transportation Needs (07/08/2023)   Received from [facility] - Transportation    Lack of Transportation (Medical): No    Lack of Transportation (Non-Medical): No  Physical Activity: Insufficiently Active (06/20/2023)   Received from [facility], [facility]   Exercise Vital Sign    Days of Exercise per Week: 2 days    Minutes of Exercise per Session: 20 min  Stress: No Stress Concern Present (06/20/2023)   Received from [facility] - Occupational Stress Questionnaire    Feeling of Stress : Not at all  Social Connections: Socially Integrated (06/20/2023)   [facility]   Social Network    How would you rate your social network (family, work, friends)?: Good participation with social networks    Allergies:  Allergies  Allergen Reactions   Asa [Aspirin] Other (See Comments)    Heart flutters   Banana Itching    Metabolic Disorder Labs: Lab Results  Component Value Date   HGBA1C 5.8 (H) 04/02/2023   No results found for: "PROLACTIN" Lab Results  Component Value Date   CHOL 198 04/02/2023   TRIG 194 (H) 04/02/2023   HDL 36 (L) 04/02/2023   CHOLHDL 5.5 (H) 04/02/2023   VLDL 29.8 04/24/2015   LDLCALC 127 (H) 04/02/2023   LDLCALC 89 04/24/2015   Lab Results  Component Value Date   TSH 1.750 04/02/2023   TSH 1.06 05/29/2021    Therapeutic Level Labs: No results found for: "LITHIUM" No results found for: "VALPROATE" No results found for: "CBMZ"  Current Medications: Current Outpatient Medications  Medication Sig Dispense Refill   albuterol (VENTOLIN HFA) 108 (90 Base) MCG/ACT inhaler Inhale 2 puffs into the lungs every 6 (six) hours as needed for  wheezing or shortness of breath. (Patient not taking: Reported on 12/24/2022) 3 each 0   AMBULATORY NON FORMULARY MEDICATION Medication Name: GI Cocktail 90 ml 2% viscous lidocaine 90 ml bentyl 10mg /57ml 270 ml Mylanta SIG 10 ml bid prn gas and stomach pain-do not use more than 3 days in a row 600 mL 2   amLODipine (NORVASC) 5 MG tablet TAKE 1 TABLET EVERY DAY (Patient taking differently: TAKE 1 10 mg TABLET EVERY DAY) 90 tablet 1   cyclobenzaprine (FEXMID) 7.5 MG tablet TAKE 1 TABLET(7.5 MG) BY MOUTH THREE TIMES DAILY AS NEEDED FOR MUSCLE SPASMS 30 tablet 3   diclofenac (VOLTAREN) 75 MG EC tablet TAKE 1 TABLET(75 MG) BY MOUTH TWICE DAILY 60 tablet 2   diclofenac Sodium (VOLTAREN) 1 % GEL APPLY 2 GRAMS TOPICALLY AS NEEDED 100 g 2   dicyclomine (BENTYL) 10 MG capsule Take 10 mg by mouth in the morning, at noon, in the evening, and at bedtime.     gabapentin (NEURONTIN) 400 MG capsule Take 1 capsule (400 mg total) by mouth 3 (three) times daily. 270 capsule 0   hydrochlorothiazide (HYDRODIURIL) 25 MG tablet TAKE 1 TABLET(25 MG) BY MOUTH DAILY 90 tablet 1   hydrOXYzine (ATARAX) 50 MG tablet Take 1 tablet (50 mg total) by mouth 3 (three) times daily as needed. 90 tablet 0   lamoTRIgine (LAMICTAL) 100 MG tablet Take 1 tablet (100 mg total) by mouth daily. 60 tablet 0   linaclotide (LINZESS) 290 MCG CAPS capsule TAKE 1 CAPSULE(290 MCG) BY MOUTH DAILY BEFORE BREAKFAST 90 capsule 3   methylPREDNISolone (MEDROL DOSEPAK) 4 MG TBPK tablet 6 day dose pack - take as directed 21 tablet 0   mirtazapine (REMERON SOL-TAB) 30 MG disintegrating tablet DISSOLVE 1 TABLET(15 MG) ON THE TONGUE AT BEDTIME 45 tablet 0   ondansetron (ZOFRAN-ODT) 8 MG disintegrating tablet Take 1 tablet (8 mg total) by mouth every 8 (eight) hours as needed for nausea or vomiting. 30 tablet 0   pantoprazole (PROTONIX) 40 MG tablet TAKE 1 TABLET(40 MG) BY MOUTH TWICE DAILY 180 tablet 0   prochlorperazine (COMPAZINE) 25 MG suppository Use 1  suppository rectally every 6-8 hours as needed nausea/vomiting 12 suppository 3   propranolol (INDERAL) 10 MG tablet Take 1 tablet (10 mg total) by mouth 2 (two) times daily. Take 1 tablet daily as needed and 1 tablet nightly 90 tablet 1   QUEtiapine (SEROQUEL XR) 400 MG 24 hr tablet Take 1 tablet (400 mg total) by mouth at bedtime. 30 tablet 0   QUEtiapine (SEROQUEL) 50 MG tablet Take 1 tablet (50 mg total) by mouth at bedtime. 60 tablet 0   sucralfate (CARAFATE) 1 g tablet Take 1 g by mouth with breakfast, with lunch, and with evening meal.     traZODone (DESYREL) 100 MG tablet Take 1 tablet (100 mg total) by mouth at bedtime as needed for sleep. 60 tablet 0   No current facility-administered medications for this visit.      Psychiatric Specialty Exam: Review of Systems  Neurological:  Negative for headaches.  Psychiatric/Behavioral:  Positive for sleep disturbance. Negative for suicidal ideas. The patient is nervous/anxious.     There were no vitals taken for this visit.There is no height or weight on file to calculate BMI.  General Appearance: Casual  Eye Contact:  Good  Speech:  Clear and Coherent  Volume:  Normal  Mood:  Anxious  Affect:  Appropriate  Thought Process:  Coherent  Orientation:  Full (Time, Place,  and Person)  Thought Content: Logical   Suicidal Thoughts:  No  Homicidal Thoughts:  No  Memory:  Immediate;   Good Recent;   Good  Judgement:  Good  Insight:  Good  Psychomotor Activity:  NA  Concentration:  Concentration: Good  Recall:  NA  Fund of Knowledge: Good  Language: Good  Akathisia:  No  Handed:    AIMS (if indicated): not done  Assets:  Communication Skills Desire for Improvement Housing Leisure Time Resilience Social Support Transportation  ADL's:  Intact  Cognition: WNL  Sleep:  Poor   Screenings: GAD-7    Loss adjuster, chartered Office Visit from 07/25/2022 in [facility] Video Visit from 01/16/2022 in [facility] Video Visit from 10/22/2021 in [facility] Video Visit from 07/16/2021 in [facility]  Total GAD-7 Score 4 2 4 18       PHQ2-9    Flowsheet Row Office Visit from 07/25/2022 in [facility] Office Visit from 07/18/2022 in [facility] Office Visit from 03/27/2022 in [facility] Video Visit from 01/16/2022 in [facility] Video Visit from 10/22/2021 in [facility]  PHQ-2 Total Score 0 0 0 0 1  PHQ-9 Total Score 6 -- -- 1 5      Flowsheet Row ED from 09/22/2023 in [facility] Emergency Department at [facility] ED from 09/09/2022 in [facility] Emergency Department at [facility]  C-SSRS RISK CATEGORY No Risk No Risk        Assessment and Plan: Patient notes improvement in her anxiety, but continues to have anxiety disrupting her sleep and having poor concentration. Provider continues to endorse importance of her coping skills and minimize medication burden. Patient willing to decrease Seroquel today. Continue to focus on decreasing Seroquel first as it has the most concerning side effect profile, may trial decreasing Seroquel and remeron at next visit, as it could be inducing paradoxical effect on sleep at 30mg .   Patient would like to be evaluated for OCD at her next visit.  Mood disorder otherwise specified vs Bipolar d/o Hx GAD [place] burden Polypharmacy - Continue Lamictal 100 mg daily  - Continue Remeron 30mg  QHS - Continue gabapentin 400 mg 3 times daily - Continue Seroquel 50 mg nightly - Decrease Seroquel ER 400mg   - Continue Trazodone  50mg  at bedtime PRN - Change Propanolol to 10mg  daily and 20mg  QHS  Appt confirmed with patient for 12/20 at 9am patient endorses she will call to make in person if she needs Follow-up in approximately 4-6 weeks    Collaboration of Care: Collaboration of Care: Attending Dr. Lucianne Muss present for a portion of assessment.  Patient/Guardian was advised Release of Information must be obtained prior to any record release in order to collaborate their care with an outside provider. Patient/Guardian was advised if they have not already done so to contact the registration department to sign all necessary forms in order for [place] to release information regarding their care.   Consent: Patient/Guardian gives verbal consent for treatment and assignment of benefits for services provided during this visit. Patient/Guardian expressed understanding and agreed to proceed.   PGY-4 [name], MD 10/24/2023, 10:58 AM

## 2023-10-28 ENCOUNTER — Telehealth: Payer: Self-pay

## 2023-10-28 NOTE — Telephone Encounter (Signed)
Transition Care Management Unsuccessful Follow-up Telephone Call  Date of discharge and from where:  [facility] 9/30  Attempts:  1st Attempt  Reason for unsuccessful TCM follow-up call:  No answer/busy   Lenard Forth [place]  [facility], [facility] Guide, Phone: [phone] Website: [email or web address]

## 2023-10-29 ENCOUNTER — Telehealth: Payer: Self-pay

## 2023-10-29 NOTE — Telephone Encounter (Signed)
Transition Care Management Unsuccessful Follow-up Telephone Call  Date of discharge and from where:  [facility] 9/30  Attempts:  2nd Attempt  Reason for unsuccessful TCM follow-up call:  No answer/busy   Lenard Forth [place]  [facility], [facility] Guide, Phone: [phone] Website: [email or web address]

## 2023-11-04 ENCOUNTER — Telehealth: Payer: Self-pay | Admitting: Podiatry

## 2023-11-04 NOTE — Telephone Encounter (Signed)
Patient called stated the pharmacy has not received the fax for refill. Please advise diclofenac (VOLTAREN) 75 MG EC tablet The pharmacy is [id number] - [place], [place] - [address] AT [facility] [address] [facility] [address]

## 2023-11-24 ENCOUNTER — Telehealth (HOSPITAL_COMMUNITY): Payer: Self-pay

## 2023-11-25 NOTE — Telephone Encounter (Signed)
Denied, patient should have enough to get to her next appt. Working with patient to make sure that her medications are correct and she does not have excess pills and incorrect dosing. Patient can call if she needs a refill.  Thanks

## 2023-12-09 ENCOUNTER — Ambulatory Visit (INDEPENDENT_AMBULATORY_CARE_PROVIDER_SITE_OTHER): Payer: 59 | Admitting: Podiatry

## 2023-12-09 ENCOUNTER — Encounter: Payer: Self-pay | Admitting: Podiatry

## 2023-12-09 DIAGNOSIS — S86311A Strain of muscle(s) and tendon(s) of peroneal muscle group at lower leg level, right leg, initial encounter: Secondary | ICD-10-CM

## 2023-12-09 MED ORDER — CELECOXIB 200 MG PO CAPS: 200.0000 mg | ORAL_CAPSULE | Freq: Two times a day (BID) | ORAL | 0 refills | Status: DC

## 2023-12-11 NOTE — Progress Notes (Signed)
  Subjective:  Patient ID: Jasmine Jackson, female    DOB: 1971/04/04,  MRN: [id number]  Chief Complaint  Patient presents with   Foot Pain    RM#1 Follow up bilateral foot and ankle pain patient states doing much better and that therapy helped.    52 y.o. female presents with the above complaint. History confirmed with patient.  She is still doing well but feels like she still needs an anti-inflammatory to take Objective:  Physical Exam: warm, good capillary refill, no trophic changes or ulcerative lesions, normal DP and PT pulses, normal sensory exam, and no pain to palpation along lateral ankle or tendons   MRI 04/30/2022 IMPRESSION:: IMPRESSION: 1. 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 2. Mild degenerative spurring at the distal aspect of the medial malleolus. 3. Mild-to-moderate degenerative changes of the posterior aspect of the posterior subtalar joint. 4. Mild talonavicular and second tarsometatarsal osteoarthritis.     Electronically Signed   By: Neita Garnet M.D.   On: 04/30/2022 15:06  Assessment:   1. Tear of peroneal tendon, right, initial encounter      Plan:  Patient was evaluated and treated and all questions answered.  Doing better but still feels like an anti-inflammatory is helpful.  I discussed with her that long-term continuing high doses of diclofenac likely will not be good for her and I would recommend switching to celecoxib which is going to be better from a GI standpoint.  She has a history of heart disease to contraindicate use.  I will send this prescription she will follow-up as needed for me.  Return if symptoms worsen or fail to improve.

## 2023-12-12 ENCOUNTER — Telehealth (HOSPITAL_COMMUNITY): Payer: Self-pay | Admitting: *Deleted

## 2023-12-12 ENCOUNTER — Encounter (HOSPITAL_COMMUNITY): Payer: Self-pay | Admitting: Student in an Organized Health Care Education/Training Program

## 2023-12-12 ENCOUNTER — Telehealth (INDEPENDENT_AMBULATORY_CARE_PROVIDER_SITE_OTHER): Payer: 59 | Admitting: Student in an Organized Health Care Education/Training Program

## 2023-12-12 DIAGNOSIS — F419 Anxiety disorder, unspecified: Secondary | ICD-10-CM

## 2023-12-12 DIAGNOSIS — F39 Unspecified mood [affective] disorder: Secondary | ICD-10-CM

## 2023-12-12 DIAGNOSIS — Z636 Dependent relative needing care at home: Secondary | ICD-10-CM

## 2023-12-12 DIAGNOSIS — G4700 Insomnia, unspecified: Secondary | ICD-10-CM

## 2023-12-12 MED ORDER — PROPRANOLOL HCL 20 MG PO TABS
20.0000 mg | ORAL_TABLET | Freq: Every day | ORAL | 0 refills | Status: DC
Start: 2023-12-12 — End: 2024-01-23

## 2023-12-12 MED ORDER — QUETIAPINE FUMARATE ER 400 MG PO TB24
400.0000 mg | ORAL_TABLET | Freq: Every day | ORAL | 0 refills | Status: DC
Start: 2023-12-12 — End: 2024-01-23

## 2023-12-12 MED ORDER — GABAPENTIN 400 MG PO CAPS
400.0000 mg | ORAL_CAPSULE | Freq: Three times a day (TID) | ORAL | 0 refills | Status: DC
Start: 2023-12-12 — End: 2024-01-23

## 2023-12-12 MED ORDER — MIRTAZAPINE 30 MG PO TBDP: ORAL_TABLET | ORAL | 0 refills | Status: DC

## 2023-12-12 MED ORDER — TRAZODONE HCL 100 MG PO TABS
100.0000 mg | ORAL_TABLET | Freq: Every evening | ORAL | 0 refills | Status: DC | PRN
Start: 2023-12-12 — End: 2024-01-23

## 2023-12-12 MED ORDER — LAMOTRIGINE 100 MG PO TABS
100.0000 mg | ORAL_TABLET | Freq: Every day | ORAL | 0 refills | Status: DC
Start: 2023-12-12 — End: 2024-01-23

## 2023-12-12 MED ORDER — HYDROXYZINE HCL 50 MG PO TABS
50.0000 mg | ORAL_TABLET | Freq: Three times a day (TID) | ORAL | 0 refills | Status: DC | PRN
Start: 2023-12-12 — End: 2024-01-23

## 2023-12-12 NOTE — Telephone Encounter (Signed)
Fax received from [facility] asking for 90 day prescription for Propranolol 20mg .

## 2023-12-12 NOTE — Progress Notes (Signed)
Virtual Visit via Video Note  I connected with Jasmine Jackson on 12/12/23 at  9:00 AM EST by a video enabled telemedicine application and verified that I am speaking with the correct person using two identifiers.  Location: Patient: Home Provider: Office   I discussed the limitations of evaluation and management by telemedicine and the availability of in person appointments. The patient expressed understanding and agreed to proceed.     I discussed the assessment and treatment plan with the patient. The patient was provided an opportunity to ask questions and all were answered. The patient agreed with the plan and demonstrated an understanding of the instructions.   The patient was advised to call back or seek an in-person evaluation if the symptoms worsen or if the condition fails to improve as anticipated.  I provided 25 minutes of non-face-to-face time during this encounter.   Jasmine Morton, MD  [facility] MD/PA/NP OP Progress Note  12/12/2023 1:18 PM Jasmine Jackson  MRN:  [id number]  Chief Complaint:  Chief Complaint  Patient presents with   Follow-up   HPI: Jasmine Jackson is a 52 yo with PPH unspecified mood disorder, PTSD, anxiety, panic attacks, and depression.  Patient reports she has been compliant with the following medication regimen:   -  Lamictal 100 mg daily  -  Remeron 30mg  QHS -  gabapentin 400 mg 3 times daily -  Seroquel 50 mg nightly -  Seroquel 400mg  XR -  Trazodone  100mg  at bedtime (taking nightly) - Hydroxyzine 50mg  TID (2x/ day most often) - Propanolol 20mg  at bedtime  rx chantix by PCP  Patient reports that she is doing ok. Patient reports that her anxiety is a 7/10 with 10 being the worst. Patient reports that she is not sure why she is feeling anxious. Patient reports that she notices her GERD acts up and she feels more bloated. Patient reports that she is sleeping, but it takes her about 2 hrs - 3hrs to fall asleep. Patient reports that she tries to get  in bed around 10pm but does not fall asleep until around 1 am. Patient reports that she averages about 6h of sleep. Patient reports that she does feel like she has enough energy to make it through the day. Patient reports that she does not use any caffeine products. Patient reports that she is also having panic attacks, but less headaches. Patient reports that her panic attacks make her feel like she is tachycardic, tingling in her fingers, and diaphragmatic breathing. Patient reports that her panic attacks are occurring almost every other day, which is an improvement over the last month, from multiple times/ day. Patient reports that she has been looking for gyms for fitness she still thinks is still a good idea to help with coping skills. Patient also reports that her son got into a day program and this is also helping get him out of the home, which will help decrease her stressors. Patient is happy her son is happy. Patient denies SI, HI, and AVH. Patinet endorses good concentration and denies anhedonia. Patient reports that her appetite is stable.   Tobacco use- Currently restarted smoking 10 cigarettes/ day. Briefly discussed the adverse effects of THC use, patient endorsed understanding and does not intend to start use after discussion.   Visit Diagnosis:    ICD-10-CM   1. Unspecified mood (affective) disorder (HCC)  F39 gabapentin (NEURONTIN) 400 MG capsule    lamoTRIgine (LAMICTAL) 100 MG tablet    QUEtiapine (SEROQUEL  XR) 400 MG 24 hr tablet    2. Anxiety  F41.9 gabapentin (NEURONTIN) 400 MG capsule    hydrOXYzine (ATARAX) 50 MG tablet    mirtazapine (REMERON SOL-TAB) 30 MG disintegrating tablet    propranolol (INDERAL) 20 MG tablet    3. Caregiver burden  Z63.6 propranolol (INDERAL) 20 MG tablet    4. Insomnia, unspecified type  G47.00 traZODone (DESYREL) 100 MG tablet         Past Psychiatric History: Last visit 09/2022-concern for extremely high dose of Seroquel and adverse side  effects due to this, decrease Seroquel XR to 300 mg then 200 mg. Patient called later endorsing problems with sleep, started patient on trazodone 25 to 50 mg nightly as needed   02/2023-continue decrease of Seroquel.  Seroquel decreased from 200 mg to 100 mg and continued Seroquel XR 400 mg.  Patient endorses on arrival that she had been taking Seroquel XR 400 and had decreased the regular Seroquel to the previously mentioned 200.  Also concern that patient does not appear to recall a true manic episode however patient was very nervous about titrating down his Seroquel.  Discussion had with patient about concern for polypharmacy and adverse metabolic side effects especially from being on Seroquel. 03/2023- Mood stable, continues to appear in remission.  Labs also indicated that patient was prediabetic and had hyperlipidemia, patient was okay with continuing down on Seroquel after discussion that this medication could increase or worsen risk for these diseases. 07/2023- Patient rx of Seroquel were old and patient was again taking higher doses and not the medication changes. Patient had to be decreased back to Seroquel XR 600mg  at bedtime and Serqoeul 50mg  at bedtime. Continued Lamictal and remeron as they were. Patient had some stressors about her mother's health, but otherwise had a fair mood and no other anxiety outside of her mother's health.  08/2023- Patient endorsing more panic attacks, but not having to call EMS as in the past, started on Propanolol 10mg  daily PRN and 10mg  at bedtime as her anxiety is keeping her up at night. Patient trying to use coping skills as well. No other medication adjustments made. 10/2023-notes improvement in her anxiety, but continues to have anxiety disrupting her sleep and having poor concentration. Decrease Seroquel from 600 to 400mg  and change Propanolol 10 BID PRN to 20mg  at bedtime. All other meds the same.  Past Medical History:  Past Medical History:  Diagnosis Date    Anemia    06/19/21-pt has no recollection of this dx   Anxiety    Anxiety and depression    Asthma    Bipolar 1 disorder (HCC)    Depression    Excessive daytime sleepiness 11/18/2017   GERD (gastroesophageal reflux disease)    Heart murmur    Hemorrhoids    Hiatal hernia    Hypertension    Internal hemorrhoids    Irritable bowel disease 2014   Toe injury 11/18/2017    Past Surgical History:  Procedure Laterality Date   COLONOSCOPY  2021   HEMORRHOID SURGERY     INGUINAL HERNIA REPAIR     PARTIAL HYSTERECTOMY     fibroids   TONSILLECTOMY     UPPER GASTROINTESTINAL ENDOSCOPY      Family Psychiatric History: Son-autism    Family History:  Family History  Problem Relation Age of Onset   Breast cancer Maternal Aunt        x4   Stomach cancer Maternal Aunt    Prostate cancer Maternal  Uncle        x2   Breast cancer Maternal Grandmother    Breast cancer Mother 75   Colon polyps Mother 9   Breast cancer Paternal Grandmother    Colon cancer Neg Hx    Esophageal cancer Neg Hx    Rectal cancer Neg Hx     Social History:  Social History   Socioeconomic History   Marital status: Single    Spouse name: Not on file   Number of children: 1   Years of education: Not on file   Highest education level: Not on file  Occupational History   Occupation: Disability  Tobacco Use   Smoking status: Every Day    Current packs/day: 0.50    Average packs/day: 0.5 packs/day for 37.0 years (18.5 ttl pk-yrs)    Types: Cigarettes   Smokeless tobacco: Never   Tobacco comments:    10  CIG A DAY  Vaping Use   Vaping status: Never Used  Substance and Sexual Activity   Alcohol use: No    Alcohol/week: 0.0 standard drinks of alcohol   Drug use: No   Sexual activity: Yes    Birth control/protection: Condom    Comment: both  Other Topics Concern   Not on file  Social History Narrative   Not on file   Social Drivers of Health   Financial Resource Strain: Low Risk   (07/08/2023)   Received from [facility]   Overall Financial Resource Strain (CARDIA)    Difficulty of Paying Living Expenses: Not very hard  Recent Concern: Financial Resource Strain - Medium Risk (06/20/2023)   Received from [facility]   Overall Financial Resource Strain (CARDIA)    Difficulty of Paying Living Expenses: Somewhat hard  Food Insecurity: No Food Insecurity (07/08/2023)   Received from [facility]   Hunger Vital Sign    Worried About Running Out of Food in the Last Year: Never true    Ran Out of Food in the Last Year: Never true  Transportation Needs: No Transportation Needs (07/08/2023)   Received from [facility] - Transportation    Lack of Transportation (Medical): No    Lack of Transportation (Non-Medical): No  Physical Activity: Insufficiently Active (06/20/2023)   Received from [facility]   Exercise Vital Sign    Days of Exercise per Week: 2 days    Minutes of Exercise per Session: 20 min  Stress: No Stress Concern Present (06/20/2023)   Received from [facility] - Occupational Stress Questionnaire    Feeling of Stress : Not at all  Social Connections: Socially Integrated (06/20/2023)   Received from [facility]   Social Network    How would you rate your social network (family, work, friends)?: Good participation with social networks    Allergies:  Allergies  Allergen Reactions   Asa [Aspirin] Other (See Comments)    Heart flutters   Banana Itching    Metabolic Disorder Labs: Lab Results  Component Value Date   HGBA1C 5.8 (H) 04/02/2023   No results found for: "PROLACTIN" Lab Results  Component Value Date   CHOL 198 04/02/2023   TRIG 194 (H) 04/02/2023   HDL 36 (L) 04/02/2023   CHOLHDL 5.5 (H) 04/02/2023   VLDL 29.8 04/24/2015   LDLCALC 127 (H) 04/02/2023   LDLCALC 89 04/24/2015   Lab Results  Component Value Date    TSH  1.750 04/02/2023   TSH 1.06 05/29/2021    Therapeutic Level Labs: No results found for: "LITHIUM" No results found for: "VALPROATE" No results found for: "CBMZ"  Current Medications: Current Outpatient Medications  Medication Sig Dispense Refill   albuterol (VENTOLIN HFA) 108 (90 Base) MCG/ACT inhaler Inhale 2 puffs into the lungs every 6 (six) hours as needed for wheezing or shortness of breath. 3 each 0   amLODipine (NORVASC) 5 MG tablet TAKE 1 TABLET EVERY DAY (Patient taking differently: TAKE 1 10 mg TABLET EVERY DAY) 90 tablet 1   celecoxib (CELEBREX) 200 MG capsule Take 1 capsule (200 mg total) by mouth 2 (two) times daily. 60 capsule 0   cyclobenzaprine (FEXMID) 7.5 MG tablet TAKE 1 TABLET(7.5 MG) BY MOUTH THREE TIMES DAILY AS NEEDED FOR MUSCLE SPASMS 30 tablet 3   dicyclomine (BENTYL) 10 MG capsule Take 10 mg by mouth in the morning, at noon, in the evening, and at bedtime.     hydrochlorothiazide (HYDRODIURIL) 25 MG tablet TAKE 1 TABLET(25 MG) BY MOUTH DAILY 90 tablet 1   linaclotide (LINZESS) 290 MCG CAPS capsule TAKE 1 CAPSULE(290 MCG) BY MOUTH DAILY BEFORE BREAKFAST 90 capsule 3   ondansetron (ZOFRAN-ODT) 8 MG disintegrating tablet Take 1 tablet (8 mg total) by mouth every 8 (eight) hours as needed for nausea or vomiting. 30 tablet 0   pantoprazole (PROTONIX) 40 MG tablet TAKE 1 TABLET(40 MG) BY MOUTH TWICE DAILY 180 tablet 0   AMBULATORY NON FORMULARY MEDICATION Medication Name: GI Cocktail 90 ml 2% viscous lidocaine 90 ml bentyl 10mg /70ml 270 ml Mylanta SIG 10 ml bid prn gas and stomach pain-do not use more than 3 days in a row 600 mL 2   gabapentin (NEURONTIN) 400 MG capsule Take 1 capsule (400 mg total) by mouth 3 (three) times daily. 270 capsule 0   hydrOXYzine (ATARAX) 50 MG tablet Take 1 tablet (50 mg total) by mouth 3 (three) times daily as needed. 90 tablet 0   lamoTRIgine (LAMICTAL) 100 MG tablet Take 1 tablet (100 mg total) by mouth daily. 60 tablet 0    methylPREDNISolone (MEDROL DOSEPAK) 4 MG TBPK tablet 6 day dose pack - take as directed (Patient not taking: Reported on 12/12/2023) 21 tablet 0   mirtazapine (REMERON SOL-TAB) 30 MG disintegrating tablet DISSOLVE 1 TABLET(15 MG) ON THE TONGUE AT BEDTIME 45 tablet 0   prochlorperazine (COMPAZINE) 25 MG suppository Use 1 suppository rectally every 6-8 hours as needed nausea/vomiting (Patient not taking: Reported on 12/12/2023) 12 suppository 3   propranolol (INDERAL) 20 MG tablet Take 1 tablet (20 mg total) by mouth at bedtime. Take 1 tablet daily as needed and 1 tablet nightly 45 tablet 0   QUEtiapine (SEROQUEL XR) 400 MG 24 hr tablet Take 1 tablet (400 mg total) by mouth at bedtime. 45 tablet 0   sucralfate (CARAFATE) 1 g tablet Take 1 g by mouth with breakfast, with lunch, and with evening meal.     traZODone (DESYREL) 100 MG tablet Take 1 tablet (100 mg total) by mouth at bedtime as needed for sleep. 45 tablet 0   No current facility-administered medications for this visit.      Psychiatric Specialty Exam: Review of Systems  Neurological:  Negative for headaches.  Psychiatric/Behavioral:  Positive for sleep disturbance. Negative for decreased concentration and suicidal ideas. The patient is nervous/anxious.     There were no vitals taken for this visit.There is no height or weight on file to calculate BMI.  General Appearance:  Casual  Eye Contact:  Good  Speech:  Clear and Coherent  Volume:  Normal  Mood:  Anxious  Affect:  Appropriate  Thought Process:  Coherent  Orientation:  Full (Time, Place, and Person)  Thought Content: Logical   Suicidal Thoughts:  No  Homicidal Thoughts:  No  Memory:  Immediate;   Good Recent;   Good  Judgement:  Good  Insight:  Good  Psychomotor Activity:  NA  Concentration:  Concentration: Good  Recall:  NA  Fund of Knowledge: Good  Language: Good  Akathisia:  No  Handed:    AIMS (if indicated): not done  Assets:  Communication Skills Desire  for Improvement Housing Leisure Time Resilience Social Support Transportation  ADL's:  Intact  Cognition: WNL  Sleep:  Fair   Screenings: GAD-7    Flowsheet Row Office Visit from 07/25/2022 in [facility] Video Visit from 01/16/2022 in [facility] Video Visit from 10/22/2021 in [facility] Video Visit from 07/16/2021 in [facility]  Total GAD-7 Score 4 2 4 18       PHQ2-9    Flowsheet Row Office Visit from 07/25/2022 in [facility] Office Visit from 07/18/2022 in [facility] - [facility] Of [place]. [facility] Office Visit from 03/27/2022 in [facility] - A Dept Of [place]. [facility] Video Visit from 01/16/2022 in [facility] Video Visit from [facility]/22/2021 in [facility]  PHQ-2 Total Score 0 0 0 0 1  PHQ-9 Total Score 6 -- -- 1 5      Flowsheet Row ED from 09/22/2023 in [facility] ED from 09/09/2022 in [facility] Emergency Department at [facility]  C-SSRS RISK CATEGORY No Risk No Risk        Assessment and Plan: Patient notes improvement in her anxiety,since last visit. Discussed with patient attempting to minimize the amount of time she spends awake in bed, as this may be worsening her anxiety about falling asleep. It does appear that despite patient's worries about the issues initiating sleep patient is getting adequate rest. Patient also endorses less dysphoric and less anhedonic symptoms. Will stop Seroquel 50mg  and at next visit will reassess patient in regards to coping skills and current treatment regimen. Discussed with patient her dx of polypharmacy and optimizing care on her regimen while decreasing risk for adverse side effects. Patient was understanding and also discussed that THC  use would likely be more detrimental to health than helpful, patient also understanding. May want to consider increasing lamictal at next visit for anxiety depending on patient presentation.   EKG 08/2023- Qtc 457 HR 82   Mood disorder otherwise specified vs Bipolar d/o Hx GAD Cargiver burden Polypharmacy - Continue Lamictal 100 mg daily  -  Continue Remeron 30mg  QHS - Continue gabapentin 400 mg 3 times daily -  Discontinue Seroquel 50 mg nightly -  Continue Seroquel 400mg  XR -  Continue Trazodone  100mg  at bedtime (taking nightly) - Continue Hydroxyzine 50mg  TID (2x/ day most often) - Continue Propanolol 20mg  at bedtime  rx chantix by PCP (patient smoking again)  Appt confirmed with patient for 12/20 at 9am patient endorses she will call to make in person if she needs Follow-up in approximately 6 weeks   Collaboration of Care: Collaboration of Care:   Patient/Guardian was advised Release  of Information must be obtained prior to any record release in order to collaborate their care with an outside provider. Patient/Guardian was advised if they have not already done so to contact the registration department to sign all necessary forms in order for [place] to release information regarding their care.   Consent: Patient/Guardian gives verbal consent for treatment and assignment of benefits for services provided during this visit. Patient/Guardian expressed understanding and agreed to proceed.   PGY-4 Jasmine Morton, MD 12/12/2023, 1:18 PM

## 2023-12-13 NOTE — Telephone Encounter (Signed)
Refills sent yesterday after her appt.

## 2023-12-22 NOTE — Telephone Encounter (Signed)
Rx sent 

## 2024-01-02 ENCOUNTER — Ambulatory Visit (HOSPITAL_BASED_OUTPATIENT_CLINIC_OR_DEPARTMENT_OTHER): Payer: 59 | Admitting: Cardiology

## 2024-01-06 ENCOUNTER — Encounter (HOSPITAL_BASED_OUTPATIENT_CLINIC_OR_DEPARTMENT_OTHER): Payer: Self-pay

## 2024-01-09 ENCOUNTER — Other Ambulatory Visit: Payer: Self-pay | Admitting: Podiatry

## 2024-01-13 ENCOUNTER — Telehealth: Payer: Self-pay

## 2024-01-13 MED ORDER — DICLOFENAC SODIUM 75 MG PO TBEC: 75.0000 mg | DELAYED_RELEASE_TABLET | Freq: Two times a day (BID) | ORAL | 0 refills | Status: DC

## 2024-01-13 NOTE — Telephone Encounter (Signed)
Patient called and left a message - the celebrex is not working for her. She would like to go back on the diclofenac, and she is asking for a higher dose. [facility] on [address]

## 2024-01-13 NOTE — Addendum Note (Signed)
Addended byLilian Kapur, Tarica Harl R on: 01/13/2024 10:50 AM   Modules accepted: Orders

## 2024-01-14 ENCOUNTER — Telehealth: Payer: Self-pay | Admitting: Gastroenterology

## 2024-01-14 ENCOUNTER — Other Ambulatory Visit: Payer: Self-pay | Admitting: Gastroenterology

## 2024-01-14 MED ORDER — DICYCLOMINE HCL 10 MG PO CAPS: 10.0000 mg | ORAL_CAPSULE | Freq: Four times a day (QID) | ORAL | 0 refills | Status: AC

## 2024-01-14 NOTE — Telephone Encounter (Signed)
Patient has upcoming appointment with Dr. Chales Abrahams Next month.  Refills sent to get to that appointment, 2-27.

## 2024-01-14 NOTE — Telephone Encounter (Signed)
PT is calling to get an update on dicyclomine refill. Please advise.

## 2024-01-14 NOTE — Telephone Encounter (Signed)
Patient needing mediation refill on dicyclomine. Please advise.

## 2024-01-20 ENCOUNTER — Telehealth (HOSPITAL_COMMUNITY): Payer: Self-pay

## 2024-01-20 NOTE — Telephone Encounter (Signed)
Medicaiton refill request - Fax from patient's [facility] on [address] for a new Hydroxyzine 50 mg order, last supplied and filled on 12/12/23 and patient does not return until 01/23/24.

## 2024-01-20 NOTE — Telephone Encounter (Signed)
I will refill her on Friday at her appt. With her other meds, she had a few left over when I last refilled, so she should not be out before Friday. Thanks .

## 2024-01-23 ENCOUNTER — Telehealth (INDEPENDENT_AMBULATORY_CARE_PROVIDER_SITE_OTHER): Payer: 59 | Admitting: Student in an Organized Health Care Education/Training Program

## 2024-01-23 ENCOUNTER — Encounter (HOSPITAL_COMMUNITY): Payer: Self-pay | Admitting: Student in an Organized Health Care Education/Training Program

## 2024-01-23 DIAGNOSIS — Z636 Dependent relative needing care at home: Secondary | ICD-10-CM

## 2024-01-23 DIAGNOSIS — G4700 Insomnia, unspecified: Secondary | ICD-10-CM

## 2024-01-23 DIAGNOSIS — F39 Unspecified mood [affective] disorder: Secondary | ICD-10-CM

## 2024-01-23 DIAGNOSIS — F411 Generalized anxiety disorder: Secondary | ICD-10-CM

## 2024-01-23 MED ORDER — TRAZODONE HCL 100 MG PO TABS: 100.0000 mg | ORAL_TABLET | Freq: Every evening | ORAL | 0 refills | Status: DC | PRN

## 2024-01-23 MED ORDER — PROPRANOLOL HCL 20 MG PO TABS: 20.0000 mg | ORAL_TABLET | Freq: Every day | ORAL | 0 refills | Status: DC

## 2024-01-23 MED ORDER — GABAPENTIN 400 MG PO CAPS: 400.0000 mg | ORAL_CAPSULE | Freq: Three times a day (TID) | ORAL | 1 refills | Status: DC

## 2024-01-23 MED ORDER — LAMOTRIGINE 150 MG PO TABS
150.0000 mg | ORAL_TABLET | Freq: Every day | ORAL | 0 refills | Status: DC
Start: 2024-01-23 — End: 2024-03-12

## 2024-01-23 MED ORDER — HYDROXYZINE HCL 50 MG PO TABS: 50.0000 mg | ORAL_TABLET | Freq: Two times a day (BID) | ORAL | 0 refills | Status: DC | PRN

## 2024-01-23 MED ORDER — QUETIAPINE FUMARATE ER 400 MG PO TB24: 400.0000 mg | ORAL_TABLET | Freq: Every day | ORAL | 0 refills | Status: DC

## 2024-01-23 NOTE — Progress Notes (Signed)
Virtual Visit via Video Note  I connected with Jasmine Jackson on 01/23/24 at  9:00 AM EST by a video enabled telemedicine application and verified that I am speaking with the correct person using two identifiers.  Location: Patient: Home Provider: Office   I discussed the limitations of evaluation and management by telemedicine and the availability of in person appointments. The patient expressed understanding and agreed to proceed.     I discussed the assessment and treatment plan with the patient. The patient was provided an opportunity to ask questions and all were answered. The patient agreed with the plan and demonstrated an understanding of the instructions.   The patient was advised to call back or seek an in-person evaluation if the symptoms worsen or if the condition fails to improve as anticipated.  I provided 25 minutes of non-face-to-face time during this encounter.   [name], MD  [facility] MD/PA/NP OP Progress Note  01/23/2024 1:45 PM Jasmine Jackson  MRN:  [id number]  Chief Complaint:  Chief Complaint  Patient presents with   Follow-up   HPI: Jasmine Jackson is a 53 yo with PPH unspecified mood disorder, PTSD, anxiety, panic attacks, and depression.  Patient reports she has been compliant with the following medication regimen:   -  Lamictal 100 mg daily  -  Remeron 30mg  QHS -  gabapentin 400 mg 3 times daily -  Seroquel 50 mg nightly -  Seroquel 400mg  XR -  Trazodone  100mg  at bedtime (taking nightly) - Hydroxyzine 50mg  TID (2x/ day most often) - Propanolol 20mg  BID  rx chantix by PCP   Patient reports that she is ok, but her grandmother just entered hospice yesterday. Patient is very tearful about this. Patient reports that she saw her at the hospital with her the last 3 nights. Patient reports that her grandmother is 99yo and had a stroke. Patient reports that she is also now thinking more about how her family has a hx of strokes and dementia and this has her  more fearful for her own health, and it is now more on her mind. Patient is worried about if she became irritable and aggressive in her dementia like her family and losing relationships. Patient reports that she has had a bout 8 friends die since June.   Patient endorses that she has started working outside, in her back yard. She spends about 2 hours each day out there. Patient reports that she does enjoy this. Patient reports that her son is doing well, he will be starting his program soon and everything is on track. Patient reports that she used to spend time with her cousins, but they use substances and she felt out of place. Patient reports that she has 1 cousin who she could spend time with and goes to her church. Patient reports that her family is reaching out.   Patient denies SI, HI, and AVH. Patient reports that she has been eating well, but she feels her diet is poor and she has increased sugar intake to cope. Patient reports that it still takes her 2 hours to fall asleep. Her sleep schedule has been more unstable since she is spending time going to the hospital. Patient reports that she still has anxiety as well associated with falling asleep and night time awakenings, but she is no longer irritable during the day and her energy level has improved.   Patient reports that she still really struggles with ruminative thoughts and uncontrolled worrying. Patient reports that  she is struggling with spending on impulse, but the purchases are around $30-$80 and they are on things she can use. Patient reports that she is buying things on sale, that she would use. Patient reports that she will slowly accumulate things and has 2 storage rooms of things, and she has a lot of attachment to the items in the room even though she knows most of it is of no use. She has tried to throw away but is already filling the space up.   Visit Diagnosis:    ICD-10-CM   1. GAD (generalized anxiety disorder)  F41.1  lamoTRIgine (LAMICTAL) 150 MG tablet    Ambulatory referral to Psychiatry    gabapentin (NEURONTIN) 400 MG capsule    hydrOXYzine (ATARAX) 50 MG tablet    propranolol (INDERAL) 20 MG tablet    QUEtiapine (SEROQUEL XR) 400 MG 24 hr tablet    traZODone (DESYREL) 100 MG tablet    2. Unspecified mood (affective) disorder (HCC)  F39 gabapentin (NEURONTIN) 400 MG capsule    QUEtiapine (SEROQUEL XR) 400 MG 24 hr tablet    3. Caregiver burden  Z63.6 Ambulatory referral to Psychiatry    propranolol (INDERAL) 20 MG tablet    4. Insomnia, unspecified type  G47.00 traZODone (DESYREL) 100 MG tablet          Past Psychiatric History: Last visit 09/2022-concern for extremely high dose of Seroquel and adverse side effects due to this, decrease Seroquel XR to 300 mg then 200 mg. Patient called later endorsing problems with sleep, started patient on trazodone 25 to 50 mg nightly as needed   02/2023-continue decrease of Seroquel.  Seroquel decreased from 200 mg to 100 mg and continued Seroquel XR 400 mg.  Patient endorses on arrival that she had been taking Seroquel XR 400 and had decreased the regular Seroquel to the previously mentioned 200.  Also concern that patient does not appear to recall a true manic episode however patient was very nervous about titrating down his Seroquel.  Discussion had with patient about concern for polypharmacy and adverse metabolic side effects especially from being on Seroquel. 03/2023- Mood stable, continues to appear in remission.  Labs also indicated that patient was prediabetic and had hyperlipidemia, patient was okay with continuing down on Seroquel after discussion that this medication could increase or worsen risk for these diseases. 07/2023- Patient rx of Seroquel were old and patient was again taking higher doses and not the medication changes. Patient had to be decreased back to Seroquel XR 600mg  at bedtime and Serqoeul 50mg  at bedtime. Continued Lamictal and remeron  as they were. Patient had some stressors about her mother's health, but otherwise had a fair mood and no other anxiety outside of her mother's health.  08/2023- Patient endorsing more panic attacks, but not having to call EMS as in the past, started on Propanolol 10mg  daily PRN and 10mg  at bedtime as her anxiety is keeping her up at night. Patient trying to use coping skills as well. No other medication adjustments made. 10/2023-notes improvement in her anxiety, but continues to have anxiety disrupting her sleep and having poor concentration. Decrease Seroquel from 600 to 400mg  and change Propanolol 10 BID PRN to 20mg  at bedtime. All other meds the same. 12/2023- Anxiety had some improvements, less panic attacks, but having some anxiety associated with insomnia. Discontinued 50mg  Seroquel.   Past Medical History:  Past Medical History:  Diagnosis Date   Anemia    06/19/21-pt has no recollection of this dx  Anxiety    Anxiety and depression    Asthma    Bipolar 1 disorder (HCC)    Depression    Excessive daytime sleepiness 11/18/2017   GERD (gastroesophageal reflux disease)    Heart murmur    Hemorrhoids    Hiatal hernia    Hypertension    Internal hemorrhoids    Irritable bowel disease 2014   Toe injury 11/18/2017    Past Surgical History:  Procedure Laterality Date   COLONOSCOPY  2021   HEMORRHOID SURGERY     INGUINAL HERNIA REPAIR     PARTIAL HYSTERECTOMY     fibroids   TONSILLECTOMY     UPPER GASTROINTESTINAL ENDOSCOPY      Family Psychiatric History: Son-autism    Family History:  Family History  Problem Relation Age of Onset   Breast cancer Maternal Aunt        x4   Stomach cancer Maternal Aunt    Prostate cancer Maternal Uncle        x2   Breast cancer Maternal Grandmother    Breast cancer Mother 85   Colon polyps Mother 23   Breast cancer Paternal Grandmother    Colon cancer Neg Hx    Esophageal cancer Neg Hx    Rectal cancer Neg Hx     Social History:   Social History   Socioeconomic History   Marital status: Single    Spouse name: Not on file   Number of children: 1   Years of education: Not on file   Highest education level: Not on file  Occupational History   Occupation: Disability  Tobacco Use   Smoking status: Every Day    Current packs/day: 0.50    Average packs/day: 0.5 packs/day for 37.0 years (18.5 ttl pk-yrs)    Types: Cigarettes   Smokeless tobacco: Never   Tobacco comments:    10  CIG A DAY  Vaping Use   Vaping status: Never Used  Substance and Sexual Activity   Alcohol use: No    Alcohol/week: 0.0 standard drinks of alcohol   Drug use: No   Sexual activity: Yes    Birth control/protection: Condom    Comment: both  Other Topics Concern   Not on file  Social History Narrative   Not on file   Social Drivers of Health   Financial Resource Strain: Low Risk  (07/08/2023)   Received from [facility]   Overall Financial Resource Strain (CARDIA)    Difficulty of Paying Living Expenses: Not very hard  Recent Concern: Financial Resource Strain - Medium Risk (06/20/2023)   Received from [facility]   Overall Financial Resource Strain (CARDIA)    Difficulty of Paying Living Expenses: Somewhat hard  Food Insecurity: No Food Insecurity (07/08/2023)   [facility] from [facility]   Hunger Vital Sign    Worried About Running Out of Food in the Last Year: Never true    Ran Out of Food in the Last Year: Never true  Transportation Needs: No Transportation Needs (07/08/2023)   Received from [facility] - Transportation    Lack of Transportation (Medical): No    Lack of Transportation (Non-Medical): No  Physical Activity: Insufficiently Active (06/20/2023)   [facility] from [facility]   Exercise Vital Sign    Days of Exercise per Week: 2 days    Minutes of Exercise per Session: 20 min  Stress: No Stress Concern Present (06/20/2023)   Received from [facility]    [employer] of Occupational Health - Occupational Stress Questionnaire    Feeling of Stress : Not at all  Social Connections: Socially Integrated (06/20/2023)   Received from [facility]   Social Network    How would you rate your social network (family, work, friends)?: Good participation with social networks    Allergies:  Allergies  Allergen Reactions   Asa [Aspirin] Other (See Comments)    Heart flutters   Banana Itching    Metabolic Disorder Labs: Lab Results  Component Value Date   HGBA1C 5.8 (H) 04/02/2023   No results found for: "PROLACTIN" Lab Results  Component Value Date   CHOL 198 04/02/2023   TRIG 194 (H) 04/02/2023   HDL 36 (L) 04/02/2023   CHOLHDL 5.5 (H) 04/02/2023   VLDL 29.8 04/24/2015   LDLCALC 127 (H) 04/02/2023   LDLCALC 89 04/24/2015   Lab Results  Component Value Date   TSH 1.750 04/02/2023   TSH 1.06 05/29/2021    Therapeutic Level Labs: No results found for: "LITHIUM" No results found for: "VALPROATE" No results found for: "CBMZ"  Current Medications: Current Outpatient Medications  Medication Sig Dispense Refill   lamoTRIgine (LAMICTAL) 150 MG tablet Take 1 tablet (150 mg total) by mouth daily. 60 tablet 0   albuterol (VENTOLIN HFA) 108 (90 Base) MCG/ACT inhaler Inhale 2 puffs into the lungs every 6 (six) hours as needed for wheezing or shortness of breath. 3 each 0   AMBULATORY NON FORMULARY MEDICATION Medication Name: GI Cocktail 90 ml 2% viscous lidocaine 90 ml bentyl 10mg /23ml 270 ml Mylanta SIG 10 ml bid prn gas and stomach pain-do not use more than 3 days in a row 600 mL 2   amLODipine (NORVASC) 5 MG tablet TAKE 1 TABLET EVERY DAY (Patient taking differently: TAKE 1 10 mg TABLET EVERY DAY) 90 tablet 1   cyclobenzaprine (FEXMID) 7.5 MG tablet TAKE 1 TABLET(7.5 MG) BY MOUTH THREE TIMES DAILY AS NEEDED FOR MUSCLE SPASMS 30 tablet 3   diclofenac (VOLTAREN) 75 MG EC tablet Take 1 tablet (75 mg total) by mouth 2  (two) times daily. 180 tablet 0   dicyclomine (BENTYL) 10 MG capsule Take 1 capsule (10 mg total) by mouth in the morning, at noon, in the evening, and at bedtime. Please keep your upcoming appointment with Dr. Chales Abrahams on 2-27 for any further refills. 120 capsule 0   gabapentin (NEURONTIN) 400 MG capsule Take 1 capsule (400 mg total) by mouth 3 (three) times daily. 270 capsule 1   hydrochlorothiazide (HYDRODIURIL) 25 MG tablet TAKE 1 TABLET(25 MG) BY MOUTH DAILY 90 tablet 1   hydrOXYzine (ATARAX) 50 MG tablet Take 1 tablet (50 mg total) by mouth 2 (two) times daily as needed. 120 tablet 0   linaclotide (LINZESS) 290 MCG CAPS capsule TAKE 1 CAPSULE(290 MCG) BY MOUTH DAILY BEFORE BREAKFAST 90 capsule 3   methylPREDNISolone (MEDROL DOSEPAK) 4 MG TBPK tablet 6 day dose pack - take as directed (Patient not taking: Reported on 12/12/2023) 21 tablet 0   mirtazapine (REMERON SOL-TAB) 30 MG disintegrating tablet DISSOLVE 1 TABLET(15 MG) ON THE TONGUE AT BEDTIME 45 tablet 0   ondansetron (ZOFRAN-ODT) 8 MG disintegrating tablet Take 1 tablet (8 mg total) by mouth every 8 (eight) hours as needed for nausea or vomiting. 30 tablet 0   pantoprazole (PROTONIX) 40 MG tablet TAKE 1 TABLET(40 MG) BY MOUTH TWICE DAILY 180 tablet 0   prochlorperazine (COMPAZINE) 25 MG suppository Use 1  suppository rectally every 6-8 hours as needed nausea/vomiting (Patient not taking: Reported on 12/12/2023) 12 suppository 3   propranolol (INDERAL) 20 MG tablet Take 1 tablet (20 mg total) by mouth at bedtime. Take 1 tablet daily as needed and 1 tablet nightly 60 tablet 0   QUEtiapine (SEROQUEL XR) 400 MG 24 hr tablet Take 1 tablet (400 mg total) by mouth at bedtime. 60 tablet 0   sucralfate (CARAFATE) 1 g tablet Take 1 g by mouth with breakfast, with lunch, and with evening meal.     traZODone (DESYREL) 100 MG tablet Take 1 tablet (100 mg total) by mouth at bedtime as needed for sleep. 60 tablet 0   No current facility-administered  medications for this visit.      Psychiatric Specialty Exam: Review of Systems  Gastrointestinal:  Positive for abdominal pain.  Neurological:  Negative for headaches.  Psychiatric/Behavioral:  Positive for sleep disturbance. Negative for decreased concentration and suicidal ideas. The patient is nervous/anxious.     There were no vitals taken for this visit.There is no height or weight on file to calculate BMI.  General Appearance: Casual  Eye Contact:  Good  Speech:  Clear and Coherent  Volume:  Normal  Mood:  Anxious  Affect:  Congruent and occasionally tearful with some topics  Thought Process:  Coherent  Orientation:  Full (Time, Place, and Person)  Thought Content: Logical   Suicidal Thoughts:  No  Homicidal Thoughts:  No  Memory:  Immediate;   Good Recent;   Good  Judgement:  Good  Insight:  Good  Psychomotor Activity:  NA  Concentration:  Concentration: Good  Recall:  NA  Fund of Knowledge: Good  Language: Good  Akathisia:  No  Handed:    AIMS (if indicated): not done  Assets:  Communication Skills Desire for Improvement Housing Leisure Time Resilience Social Support Transportation  ADL's:  Intact  Cognition: WNL  Sleep:  Fair   Screenings: GAD-7    Flowsheet Row Office Visit from 07/25/2022 in [facility] Video Visit from 01/16/2022 in [facility] Video Visit from 10/22/2021 in [facility] Video Visit from 07/16/2021 in [facility]  Total GAD-7 Score 4 2 4 18       PHQ2-9    Flowsheet Row Office Visit from 07/25/2022 in [facility] Office Visit from 07/18/2022 in [facility] Of [place]. [facility]  Office Visit from 03/27/2022 in [facility] - A Dept Of [place]. [facility] Video Visit from 01/16/2022 in [facility] Video Visit  from 10/22/2021 in [facility]  PHQ-2 Total Score 0 0 0 0 1  PHQ-9 Total Score 6 -- -- 1 5      Flowsheet Row ED from 09/22/2023 in [facility] at [facility] ED from 09/09/2022 in [facility] Department at [facility]  C-SSRS RISK CATEGORY No Risk No Risk        Assessment and Plan: Patient continues to endorse significant anxiety but no panic attacks and decrease irritability.  Patient recently saw a GI doctor and has diagnosis of IBS and needed medication adjustments, patient also noted to have GI issues during assessment today as she is going back and forth to the restroom.  Patient interested in starting with a therapist to address continued anxiety around her mother's illness and  how this impacts her as well as would benefit from CBT to help with cognitive thought distortions.  Patient continues to try and use previously learned skills and mechanisms such as being outside and reaching out to family members she can trust.  However, we will increase patient's Lamictal to optimize current medications to treat GAD.  Patient also endorses behavior concerning for hoarding disorder, we will continue to follow-up with patient about this in the future, and this likely should be addressed when starting therapy.  Given that patient's visits are virtual, may ask for patient to show parts of her home.  Patient does appear to have some insight into possible hoarding behaviors.  EKG 08/2023- Qtc 457 HR 82   Mood disorder otherwise specified vs Bipolar d/o Concern for hoarding disorder Hx GAD [place] burden Polypharmacy - Increase Lamictal to 150 mg daily  -  Continue Remeron 30mg  QHS - Continue gabapentin 400 mg 3 times daily -  Discontinue Seroquel 50 mg nightly -  Continue Seroquel 400mg  XR -  Continue Trazodone  100mg  at bedtime (taking nightly) - Continue Hydroxyzine 50mg  BID PRN  - Continue Propanolol 20mg  at bedtime   rx chantix by PCP (patient smoking again)  Follow-up in approximately 6 weeks   Collaboration of Care: Collaboration of Care:   Patient/Guardian was advised Release of Information must be obtained prior to any record release in order to collaborate their care with an outside provider. Patient/Guardian was advised if they have not already done so to contact the registration department to sign all necessary forms in order for [place] to release information regarding their care.   Consent: Patient/Guardian gives verbal consent for treatment and assignment of benefits for services provided during this visit. Patient/Guardian expressed understanding and agreed to proceed.   PGY-4 [name], MD 01/23/2024, 1:45 PM

## 2024-01-26 ENCOUNTER — Telehealth (HOSPITAL_COMMUNITY): Payer: Self-pay

## 2024-01-26 NOTE — Telephone Encounter (Addendum)
Phamcy is requesting 90 day quantity for 1 tablet (20Mg ) by mouth PROPRANOLOL for Pt.

## 2024-01-30 ENCOUNTER — Other Ambulatory Visit (HOSPITAL_COMMUNITY): Payer: Self-pay | Admitting: Student in an Organized Health Care Education/Training Program

## 2024-01-30 ENCOUNTER — Telehealth (HOSPITAL_COMMUNITY): Payer: Self-pay | Admitting: Student in an Organized Health Care Education/Training Program

## 2024-01-30 DIAGNOSIS — Z636 Dependent relative needing care at home: Secondary | ICD-10-CM

## 2024-01-30 DIAGNOSIS — F411 Generalized anxiety disorder: Secondary | ICD-10-CM

## 2024-01-30 MED ORDER — PROPRANOLOL HCL 20 MG PO TABS: 20.0000 mg | ORAL_TABLET | Freq: Every day | ORAL | 0 refills | Status: DC

## 2024-01-30 NOTE — Progress Notes (Signed)
 Pharmacy requested 90 day supply of Propanolol. New order sent.   PGY-4  Marvetta Sloan, MD

## 2024-01-30 NOTE — Telephone Encounter (Signed)
Ok, sent. Thanks!

## 2024-01-30 NOTE — Telephone Encounter (Signed)
 No, I have not called patient.

## 2024-02-10 ENCOUNTER — Other Ambulatory Visit: Payer: Self-pay

## 2024-02-10 MED ORDER — LINACLOTIDE 290 MCG PO CAPS: 290.0000 ug | ORAL_CAPSULE | Freq: Every day | ORAL | 0 refills | Status: DC

## 2024-02-14 ENCOUNTER — Other Ambulatory Visit: Payer: Self-pay | Admitting: Podiatry

## 2024-02-19 ENCOUNTER — Encounter: Payer: Self-pay | Admitting: Gastroenterology

## 2024-02-19 ENCOUNTER — Ambulatory Visit (INDEPENDENT_AMBULATORY_CARE_PROVIDER_SITE_OTHER): Payer: 59 | Admitting: Gastroenterology

## 2024-02-19 VITALS — BP 122/88 | Ht 65.0 in | Wt 224.0 lb

## 2024-02-19 DIAGNOSIS — R112 Nausea with vomiting, unspecified: Secondary | ICD-10-CM | POA: Diagnosis not present

## 2024-02-19 DIAGNOSIS — K219 Gastro-esophageal reflux disease without esophagitis: Secondary | ICD-10-CM | POA: Diagnosis not present

## 2024-02-19 DIAGNOSIS — K449 Diaphragmatic hernia without obstruction or gangrene: Secondary | ICD-10-CM

## 2024-02-19 DIAGNOSIS — K581 Irritable bowel syndrome with constipation: Secondary | ICD-10-CM | POA: Diagnosis not present

## 2024-02-19 MED ORDER — PROCHLORPERAZINE 25 MG RE SUPP: 25.0000 mg | Freq: Two times a day (BID) | RECTAL | 0 refills | Status: DC | PRN

## 2024-02-19 MED ORDER — LINACLOTIDE 290 MCG PO CAPS: 290.0000 ug | ORAL_CAPSULE | Freq: Every day | ORAL | 3 refills | Status: DC

## 2024-02-19 NOTE — Progress Notes (Signed)
 Chief Complaint: FU  Referring Provider:  No ref. provider found      ASSESSMENT AND PLAN;   #1. GERD with small HH with occ N/V.   #2. IBS-C. Neg colon 07/2020  Plan: -Linzess po every day -Miralax 17g po BID until large BM, then QD -Stop Volteran. -Compazine 25mg  supp Q12 hrs prn #15 -Continue Protonix 40 mg p.o. twice daily for now.  Can wean off Carafate. -FU in 3 months. If still with problems, consider CT AP   HPI:    Jasmine Jackson is a 53 y.o. female  Patient of Dr. Russella Dar.  Discussed the use of AI scribe software for clinical note transcription with the patient, who gave verbal consent to proceed.  History of Present Illness   Jasmine Jackson is a 53 year old female with a history of polyps and hiatal hernia who presents with gastrointestinal symptoms and constipation.  She experiences ongoing gastrointestinal symptoms, including significant constipation, which she attributes to her medications. Her current medications include amlodipine, diclofenac, cyclobenzaprine, dicyclomine, ondansetron, propranolol, and trazodone, all known to cause constipation. She takes Linzess 290 mcg, which sometimes helps, but she learned not to take two doses at once. She also uses Miralax occasionally. Her constipation is severe, leading to regurgitation with a foul odor, described as 'smelling like feces'.  Her reflux symptoms are managed with pantoprazole (Protonix) twice daily and Carafate. Despite this, she reports episodes of regurgitation and significant discomfort. She has a small hiatal hernia and a Schatzky's ring, contributing to her reflux symptoms. She has been advised against using dicyclomine for reflux as it exacerbates her symptoms.  She has a history of polyps found during a colonoscopy in August 2021, where five sessile polyps in the sigmoid colon were resected. She was advised to have a follow-up colonoscopy in 2031.  She has a history of IBS, which complicates  her gastrointestinal symptoms. Her dietary habits include drinking water with lemon, avoiding sodas and juices, and occasionally consuming Kool-Aid and ginger snap cookies, which she notes help with bowel movements. She does not consume prunes but is open to trying them again.        Past GI [facility]  EGD 06/22/2021 - Small hiatal hernia. - Moderate gastritis.  Colon 07/2020: hyperplastic polyps. Rpt 10 yrs  CT AP 05/2021: Neg Past Medical History:  Diagnosis Date   Anemia    06/19/21-pt has no recollection of this dx   Anxiety    Anxiety and depression    Asthma    Bipolar 1 disorder (HCC)    Depression    Excessive daytime sleepiness 11/18/2017   GERD (gastroesophageal reflux disease)    Heart murmur    Hemorrhoids    Hiatal hernia    Hypertension    Internal hemorrhoids    Irritable bowel disease 2014   Toe injury 11/18/2017    Past Surgical History:  Procedure Laterality Date   COLONOSCOPY  2021   HEMORRHOID SURGERY     INGUINAL HERNIA REPAIR     PARTIAL HYSTERECTOMY     fibroids   TONSILLECTOMY     UPPER GASTROINTESTINAL ENDOSCOPY      Family History  Problem Relation Age of Onset   Breast cancer Maternal Aunt        x4   Stomach cancer Maternal Aunt    Prostate cancer Maternal Uncle        x2   Breast cancer Maternal Grandmother    Breast cancer Mother 57  Colon polyps Mother 58   Breast cancer Paternal Grandmother    Colon cancer Neg Hx    Esophageal cancer Neg Hx    Rectal cancer Neg Hx     Social History   Tobacco Use   Smoking status: Every Day    Current packs/day: 0.50    Average packs/day: 0.5 packs/day for 37.0 years (18.5 ttl pk-yrs)    Types: Cigarettes   Smokeless tobacco: Never   Tobacco comments:    10  CIG A DAY  Vaping Use   Vaping status: Never Used  Substance Use Topics   Alcohol use: No    Alcohol/week: 0.0 standard drinks of alcohol   Drug use: No    Current Outpatient Medications  Medication Sig Dispense Refill    albuterol (VENTOLIN HFA) 108 (90 Base) MCG/ACT inhaler Inhale 2 puffs into the lungs every 6 (six) hours as needed for wheezing or shortness of breath. 3 each 0   amLODipine (NORVASC) 5 MG tablet TAKE 1 TABLET EVERY DAY (Patient taking differently: TAKE 1 10 mg TABLET EVERY DAY) 90 tablet 1   cyclobenzaprine (FEXMID) 7.5 MG tablet TAKE 1 TABLET(7.5 MG) BY MOUTH THREE TIMES DAILY AS NEEDED FOR MUSCLE SPASMS 30 tablet 3   diclofenac (VOLTAREN) 75 MG EC tablet TAKE 1 TABLET(75 MG) BY MOUTH TWICE DAILY 60 tablet 1   dicyclomine (BENTYL) 10 MG capsule Take 1 capsule (10 mg total) by mouth in the morning, at noon, in the evening, and at bedtime. Please keep your upcoming appointment with Dr. Chales Abrahams on 2-27 for any further refills. 120 capsule 0   gabapentin (NEURONTIN) 400 MG capsule Take 1 capsule (400 mg total) by mouth 3 (three) times daily. 270 capsule 1   hydrochlorothiazide (HYDRODIURIL) 25 MG tablet TAKE 1 TABLET(25 MG) BY MOUTH DAILY 90 tablet 1   hydrOXYzine (ATARAX) 50 MG tablet Take 1 tablet (50 mg total) by mouth 2 (two) times daily as needed. 120 tablet 0   lamoTRIgine (LAMICTAL) 150 MG tablet Take 1 tablet (150 mg total) by mouth daily. 60 tablet 0   linaclotide (LINZESS) 290 MCG CAPS capsule Take 1 capsule (290 mcg total) by mouth daily before breakfast. 90 capsule 0   methylPREDNISolone (MEDROL DOSEPAK) 4 MG TBPK tablet 6 day dose pack - take as directed 21 tablet 0   mirtazapine (REMERON SOL-TAB) 30 MG disintegrating tablet DISSOLVE 1 TABLET(15 MG) ON THE TONGUE AT BEDTIME 45 tablet 0   ondansetron (ZOFRAN-ODT) 8 MG disintegrating tablet Take 1 tablet (8 mg total) by mouth every 8 (eight) hours as needed for nausea or vomiting. 30 tablet 0   pantoprazole (PROTONIX) 40 MG tablet TAKE 1 TABLET(40 MG) BY MOUTH TWICE DAILY 180 tablet 0   propranolol (INDERAL) 20 MG tablet Take 1 tablet (20 mg total) by mouth at bedtime. 90 tablet 0   QUEtiapine (SEROQUEL XR) 400 MG 24 hr tablet Take 1 tablet  (400 mg total) by mouth at bedtime. 60 tablet 0   sucralfate (CARAFATE) 1 g tablet Take 1 g by mouth with breakfast, with lunch, and with evening meal.     traZODone (DESYREL) 100 MG tablet Take 1 tablet (100 mg total) by mouth at bedtime as needed for sleep. 60 tablet 0   AMBULATORY NON FORMULARY MEDICATION Medication Name: GI Cocktail 90 ml 2% viscous lidocaine 90 ml bentyl 10mg /57ml 270 ml Mylanta SIG 10 ml bid prn gas and stomach pain-do not use more than 3 days in a row (Patient not taking: Reported  on 02/19/2024) 600 mL 2   prochlorperazine (COMPAZINE) 25 MG suppository Use 1 suppository rectally every 6-8 hours as needed nausea/vomiting (Patient not taking: Reported on 02/19/2024) 12 suppository 3   No current facility-administered medications for this visit.    Allergies  Allergen Reactions   Asa [Aspirin] Other (See Comments)    Heart flutters   Banana Itching    Review of Systems:  Constitutional: Denies fever, chills, diaphoresis, appetite change and fatigue.  HEENT: Denies photophobia, eye pain, redness, hearing loss, ear pain, congestion, sore throat, rhinorrhea, sneezing, mouth sores, neck pain, neck stiffness and tinnitus.   Respiratory: Denies SOB, DOE, cough, chest tightness,  and wheezing.   Cardiovascular: Denies chest pain, palpitations and leg swelling.  Genitourinary: Denies dysuria, urgency, frequency, hematuria, flank pain and difficulty urinating.  Musculoskeletal: Denies myalgias, back pain, joint swelling, arthralgias and gait problem.  Skin: No rash.  Neurological: Denies dizziness, seizures, syncope, weakness, light-headedness, numbness and headaches.  Hematological: Denies adenopathy. Easy bruising, personal or family bleeding history  Psychiatric/Behavioral: No anxiety or depression     Physical Exam:    BP 122/88   Ht 5\' 5"  (1.651 m)   Wt 224 lb (101.6 kg)   BMI 37.28 kg/m  Wt Readings from Last 3 Encounters:  02/19/24 224 lb (101.6 kg)  09/07/23  213 lb (96.6 kg)  12/24/22 227 lb (103 kg)   Constitutional:  Well-developed, in no acute distress. Psychiatric: Normal mood and affect. Behavior is normal. HEENT: Pupils normal.  Conjunctivae are normal. No scleral icterus. Neck supple.  Cardiovascular: Normal rate, regular rhythm. No edema Pulmonary/chest: Effort normal and breath sounds normal. No wheezing, rales or rhonchi. Abdominal: Soft, nondistended. Nontender. Bowel sounds active throughout. There are no masses palpable. No hepatomegaly. Rectal: Deferred Neurological: Alert and oriented to person place and time. Skin: Skin is warm and dry. No rashes noted.  Data Reviewed: I have personally reviewed following labs and imaging studies  CBC:    Latest Ref Rng & Units 09/22/2023    4:15 PM 09/07/2023   12:44 PM 04/02/2023   12:00 AM  CBC  WBC 4.0 - 10.5 K/uL 6.1  6.1  6.2   Hemoglobin 12.0 - 15.0 g/dL 16.1  09.6  04.5   Hematocrit 36.0 - 46.0 % 42.7  39.9  41.2   Platelets 150 - 400 K/uL 326  354  370     CMP:    Latest Ref Rng & Units 09/22/2023    4:15 PM 09/07/2023   12:44 PM 04/02/2023   12:00 AM  CMP  Glucose 70 - 99 mg/dL 90  87  82   BUN 6 - 20 mg/dL 17  11  10    Creatinine 0.44 - 1.00 mg/dL 4.09  8.11  9.14   Sodium 135 - 145 mmol/L 138  141  142   Potassium 3.5 - 5.1 mmol/L 4.5  3.8  5.0   Chloride 98 - 111 mmol/L 103  105  103   CO2 22 - 32 mmol/L 27  28  24    Calcium 8.9 - 10.3 mg/dL 78.2  9.3  95.6   Total Protein 6.0 - 8.5 g/dL   7.2   Total Bilirubin 0.0 - 1.2 mg/dL   0.4   Alkaline Phos 44 - 121 IU/L   92   AST 0 - 40 IU/L   20   ALT 0 - 32 IU/L   19         Edman Circle, MD 02/19/2024, 4:17  PM  Cc: No ref. provider found

## 2024-02-19 NOTE — Patient Instructions (Addendum)
 _______________________________________________________  If your blood pressure at your visit was 140/90 or greater, please contact your primary care physician to follow up on this.  _______________________________________________________  If you are age 54 or older, your body mass index should be between 23-30. Your Body mass index is 37.28 kg/m. If this is out of the aforementioned range listed, please consider follow up with your Primary Care Provider.  If you are age 45 or younger, your body mass index should be between 19-25. Your Body mass index is 37.28 kg/m. If this is out of the aformentioned range listed, please consider follow up with your Primary Care Provider.   ________________________________________________________  The [facility] providers would like to encourage you to use [facility] to communicate with providers for non-urgent requests or questions.  Due to long hold times on the telephone, sending your provider a message by [facility] may be a faster and more efficient way to get a response.  Please allow 48 business hours for a response.  Please remember that this is for non-urgent requests.  _______________________________________________________  We have sent the following medications to your pharmacy for you to pick up at your convenience: Linzess 290mg  Compazine 25mg    Please purchase the following medications over the counter and take as directed: Miralax 17g 2 times a day until a large bowel movement and then 17g daily  Stop Voltaren  You have been scheduled for an appointment with Dr. Chales Abrahams on 05-13-24 at 4pm . Please arrive 10 minutes early for your appointment.  Thank you,  Dr. Lynann Bologna

## 2024-03-12 ENCOUNTER — Telehealth (HOSPITAL_COMMUNITY): Payer: 59 | Admitting: Student in an Organized Health Care Education/Training Program

## 2024-03-12 DIAGNOSIS — F419 Anxiety disorder, unspecified: Secondary | ICD-10-CM

## 2024-03-12 DIAGNOSIS — Z636 Dependent relative needing care at home: Secondary | ICD-10-CM

## 2024-03-12 DIAGNOSIS — F411 Generalized anxiety disorder: Secondary | ICD-10-CM

## 2024-03-12 DIAGNOSIS — G4700 Insomnia, unspecified: Secondary | ICD-10-CM

## 2024-03-12 MED ORDER — TRAZODONE HCL 100 MG PO TABS: 100.0000 mg | ORAL_TABLET | Freq: Every evening | ORAL | 0 refills | Status: DC | PRN

## 2024-03-12 MED ORDER — HYDROXYZINE HCL 50 MG PO TABS: 50.0000 mg | ORAL_TABLET | Freq: Two times a day (BID) | ORAL | 0 refills | Status: DC | PRN

## 2024-03-12 MED ORDER — PROPRANOLOL HCL 20 MG PO TABS: 20.0000 mg | ORAL_TABLET | Freq: Every day | ORAL | 0 refills | Status: DC

## 2024-03-12 MED ORDER — LAMOTRIGINE 150 MG PO TABS: 150.0000 mg | ORAL_TABLET | Freq: Every day | ORAL | 0 refills | Status: DC

## 2024-03-12 MED ORDER — MIRTAZAPINE 30 MG PO TBDP: ORAL_TABLET | ORAL | 0 refills | Status: DC

## 2024-03-12 NOTE — Progress Notes (Signed)
 Virtual Visit via Video Note  I connected with Jasmine Jackson on 03/12/24 at  9:30 AM EDT by a video enabled telemedicine application and verified that I am speaking with the correct person using two identifiers.  Location: Patient: Home Provider: Office   I discussed the limitations of evaluation and management by telemedicine and the availability of in person appointments. The patient expressed understanding and agreed to proceed.     I discussed the assessment and treatment plan with the patient. The patient was provided an opportunity to ask questions and all were answered. The patient agreed with the plan and demonstrated an understanding of the instructions.   The patient was advised to call back or seek an in-person evaluation if the symptoms worsen or if the condition fails to improve as anticipated.  I provided 25 minutes of non-face-to-face time during this encounter.   [name], MD  [facility] MD/PA/NP OP Progress Note  03/12/2024 2:33 PM Jasmine Jackson  MRN:  [id number]  Chief Complaint:  Chief Complaint  Patient presents with   Follow-up   HPI: Jasmine Jackson is a 53 yo with PPH unspecified mood disorder, PTSD, anxiety, panic attacks, and depression.  Patient reports she has been compliant with the following medication regimen:   -  Lamictal 150 mg daily  -  Remeron 30mg  QHS -  gabapentin 400 mg 3 times daily -  Seroquel 400mg  XR -  Trazodone  100mg  at bedtime (taking nightly) - Hydroxyzine 50mg  TID (2x/ day most often) - Propanolol 20mg  BID   Pt reports that she is doing ok, but she has started back smoking cigarettes due to her grandmother's death and then her dog, but she will be restarting Chantix soon. Pt reports that she is smoking about 10 cigs/ day. Pt reports that her mood has improved, she has been getting out of the house more and is walking 37mi/ day. Patient reports that she is grieving her grandmother's death well. Patient reports that her appetite has  decreased some, but this is due to [place] and is intentional. Patient reports that her sleep has been poor the last 3 weeks. Patient reports that she is averaging 4-4.5 hours. Patient reports that she feels tired at night, but has trouble falling asleep at night due to anxious thoughts. Patient reports that she may on occasion fall asleep better and sleep 7 hours, but at least 4 days/ week she has trouble falling asleep. Patient reports that she has good energy during the day.   Patient reports that she is still having anxiety day or night. Patient reports that her anxiety is more general. Pt [facility] to [facility] and they have a 2 mon wait for anxiety. Patient denies SI, HI, and AVH.    Patient reports she had a in lab sleep study done years ago due to her snoring, but she did not have sleep apnea.  Caffeine: none Etoh: denies Pt reports she will drink sugared juice up until bed time.   Pt son is doing well in his program.   Visit Diagnosis:    ICD-10-CM   1. GAD (generalized anxiety disorder)  F41.1 hydrOXYzine (ATARAX) 50 MG tablet    lamoTRIgine (LAMICTAL) 150 MG tablet    propranolol (INDERAL) 20 MG tablet    traZODone (DESYREL) 100 MG tablet    2. Anxiety  F41.9 mirtazapine (REMERON SOL-TAB) 30 MG disintegrating tablet    3. Caregiver burden  Z63.6 propranolol (INDERAL) 20 MG tablet  4. Insomnia, unspecified type  G47.00 traZODone (DESYREL) 100 MG tablet           Past Psychiatric History: Last visit 09/2022-concern for extremely high dose of Seroquel and adverse side effects due to this, decrease Seroquel XR to 300 mg then 200 mg. Patient called later endorsing problems with sleep, started patient on trazodone 25 to 50 mg nightly as needed   02/2023-continue decrease of Seroquel.  Seroquel decreased from 200 mg to 100 mg and continued Seroquel XR 400 mg.  Patient endorses on arrival that she had been taking Seroquel XR 400 and had decreased the regular Seroquel to  the previously mentioned 200.  Also concern that patient does not appear to recall a true manic episode however patient was very nervous about titrating down his Seroquel.  Discussion had with patient about concern for polypharmacy and adverse metabolic side effects especially from being on Seroquel. 03/2023- Mood stable, continues to appear in remission.  Labs also indicated that patient was prediabetic and had hyperlipidemia, patient was okay with continuing down on Seroquel after discussion that this medication could increase or worsen risk for these diseases. 07/2023- Patient rx of Seroquel were old and patient was again taking higher doses and not the medication changes. Patient had to be decreased back to Seroquel XR 600mg  at bedtime and Serqoeul 50mg  at bedtime. Continued Lamictal and remeron as they were. Patient had some stressors about her mother's health, but otherwise had a fair mood and no other anxiety outside of her mother's health.  08/2023- Patient endorsing more panic attacks, but not having to call EMS as in the past, started on Propanolol 10mg  daily PRN and 10mg  at bedtime as her anxiety is keeping her up at night. Patient trying to use coping skills as well. No other medication adjustments made. 10/2023-notes improvement in her anxiety, but continues to have anxiety disrupting her sleep and having poor concentration. Decrease Seroquel from 600 to 400mg  and change Propanolol 10 BID PRN to 20mg  at bedtime. All other meds the same. 12/2023- Anxiety had some improvements, less panic attacks, but having some anxiety associated with insomnia. Discontinued 50mg  Seroquel.   Past Medical History:  Past Medical History:  Diagnosis Date   Anemia    06/19/21-pt has no recollection of this dx   Anxiety    Anxiety and depression    Asthma    Bipolar 1 disorder (HCC)    Depression    Excessive daytime sleepiness 11/18/2017   GERD (gastroesophageal reflux disease)    Heart murmur    Hemorrhoids     Hiatal hernia    Hypertension    Internal hemorrhoids    Irritable bowel disease 2014   Toe injury 11/18/2017    Past Surgical History:  Procedure Laterality Date   COLONOSCOPY  2021   HEMORRHOID SURGERY     INGUINAL HERNIA REPAIR     PARTIAL HYSTERECTOMY     fibroids   TONSILLECTOMY     UPPER GASTROINTESTINAL ENDOSCOPY      Family Psychiatric History: Son-autism    Family History:  Family History  Problem Relation Age of Onset   Breast cancer Maternal Aunt        x4   Stomach cancer Maternal Aunt    Prostate cancer Maternal Uncle        x2   Breast cancer Maternal Grandmother    Breast cancer Mother 60   Colon polyps Mother 57   Breast cancer Paternal Grandmother    Colon cancer  Neg Hx    Esophageal cancer Neg Hx    Rectal cancer Neg Hx     Social History:  Social History   Socioeconomic History   Marital status: Single    Spouse name: Not on file   Number of children: 1   Years of education: Not on file   Highest education level: Not on file  Occupational History   Occupation: Disability  Tobacco Use   Smoking status: Every Day    Current packs/day: 0.50    Average packs/day: 0.5 packs/day for 37.0 years (18.5 ttl pk-yrs)    Types: Cigarettes   Smokeless tobacco: Never   Tobacco comments:    10  CIG A DAY  Vaping Use   Vaping status: Never Used  Substance and Sexual Activity   Alcohol use: No    Alcohol/week: 0.0 standard drinks of alcohol   Drug use: No   Sexual activity: Yes    Birth control/protection: Condom    Comment: both  Other Topics Concern   Not on file  Social History Narrative   Not on file   Social Drivers of Health   Financial Resource Strain: Low Risk  (07/08/2023)   Received from [facility]   Overall Financial Resource Strain (CARDIA)    Difficulty of Paying Living Expenses: Not very hard  Recent Concern: Financial Resource Strain - Medium Risk (06/20/2023)   Received from [facility]   Overall  Financial Resource Strain (CARDIA)    Difficulty of Paying Living Expenses: Somewhat hard  Food Insecurity: No Food Insecurity (07/08/2023)   Received from [facility]   Hunger Vital Sign    Worried About Running Out of Food in the Last Year: Never true    Ran Out of Food in the Last Year: Never true  Transportation Needs: No Transportation Needs (07/08/2023)   Received from [facility] - Transportation    Lack of Transportation (Medical): No    Lack of Transportation (Non-Medical): No  Physical Activity: Insufficiently Active (06/20/2023)   Received from [facility]   Exercise Vital Sign    Days of Exercise per Week: 2 days    Minutes of Exercise per Session: 20 min  Stress: No Stress Concern Present (06/20/2023)   Received from [facility] - Occupational Stress Questionnaire    Feeling of Stress : Not at all  Social Connections: Socially Integrated (06/20/2023)   [facility] from [facility], [facility]   Social Network    How would you rate your social network (family, work, friends)?: Good participation with social networks    Allergies:  Allergies  Allergen Reactions   Asa [Aspirin] Other (See Comments)    Heart flutters   Banana Itching    Metabolic Disorder Labs: Lab Results  Component Value Date   HGBA1C 5.8 (H) 04/02/2023   No results found for: "PROLACTIN" Lab Results  Component Value Date   CHOL 198 04/02/2023   TRIG 194 (H) 04/02/2023   HDL 36 (L) 04/02/2023   CHOLHDL 5.5 (H) 04/02/2023   VLDL 29.8 04/24/2015   LDLCALC 127 (H) 04/02/2023   LDLCALC 89 04/24/2015   Lab Results  Component Value Date   TSH 1.750 04/02/2023   TSH 1.06 05/29/2021    Therapeutic Level Labs: No results found for: "LITHIUM" No results found for: "VALPROATE" No results found for: "CBMZ"  Current Medications: Current Outpatient Medications  Medication Sig Dispense Refill  albuterol (VENTOLIN HFA) 108 (90 Base) MCG/ACT inhaler Inhale 2 puffs into the lungs every 6 (six) hours as needed for wheezing or shortness of breath. 3 each 0   AMBULATORY NON FORMULARY MEDICATION Medication Name: GI Cocktail 90 ml 2% viscous lidocaine 90 ml bentyl 10mg /55ml 270 ml Mylanta SIG 10 ml bid prn gas and stomach pain-do not use more than 3 days in a row (Patient not taking: Reported on 02/19/2024) 600 mL 2   amLODipine (NORVASC) 5 MG tablet TAKE 1 TABLET EVERY DAY (Patient taking differently: TAKE 1 10 mg TABLET EVERY DAY) 90 tablet 1   cyclobenzaprine (FEXMID) 7.5 MG tablet TAKE 1 TABLET(7.5 MG) BY MOUTH THREE TIMES DAILY AS NEEDED FOR MUSCLE SPASMS 30 tablet 3   diclofenac (VOLTAREN) 75 MG EC tablet TAKE 1 TABLET(75 MG) BY MOUTH TWICE DAILY 60 tablet 1   dicyclomine (BENTYL) 10 MG capsule Take 1 capsule (10 mg total) by mouth in the morning, at noon, in the evening, and at bedtime. Please keep your upcoming appointment with Dr. Chales Abrahams on 2-27 for any further refills. 120 capsule 0   gabapentin (NEURONTIN) 400 MG capsule Take 1 capsule (400 mg total) by mouth 3 (three) times daily. 270 capsule 1   hydrochlorothiazide (HYDRODIURIL) 25 MG tablet TAKE 1 TABLET(25 MG) BY MOUTH DAILY 90 tablet 1   hydrOXYzine (ATARAX) 50 MG tablet Take 1 tablet (50 mg total) by mouth 2 (two) times daily as needed. 120 tablet 0   lamoTRIgine (LAMICTAL) 150 MG tablet Take 1 tablet (150 mg total) by mouth daily. 60 tablet 0   linaclotide (LINZESS) 290 MCG CAPS capsule Take 1 capsule (290 mcg total) by mouth daily before breakfast. 90 capsule 0   linaclotide (LINZESS) 290 MCG CAPS capsule Take 1 capsule (290 mcg total) by mouth daily before breakfast. 90 capsule 3   methylPREDNISolone (MEDROL DOSEPAK) 4 MG TBPK tablet 6 day dose pack - take as directed 21 tablet 0   mirtazapine (REMERON SOL-TAB) 30 MG disintegrating tablet DISSOLVE 1 TABLET(15 MG) ON THE TONGUE AT BEDTIME 45 tablet 0   ondansetron (ZOFRAN-ODT) 8 MG  disintegrating tablet Take 1 tablet (8 mg total) by mouth every 8 (eight) hours as needed for nausea or vomiting. 30 tablet 0   pantoprazole (PROTONIX) 40 MG tablet TAKE 1 TABLET(40 MG) BY MOUTH TWICE DAILY 180 tablet 0   prochlorperazine (COMPAZINE) 25 MG suppository Use 1 suppository rectally every 6-8 hours as needed nausea/vomiting (Patient not taking: Reported on 02/19/2024) 12 suppository 3   prochlorperazine (COMPAZINE) 25 MG suppository Place 1 suppository (25 mg total) rectally every 12 (twelve) hours as needed for nausea or vomiting. 15 suppository 0   propranolol (INDERAL) 20 MG tablet Take 1 tablet (20 mg total) by mouth at bedtime. 90 tablet 0   QUEtiapine (SEROQUEL XR) 400 MG 24 hr tablet Take 1 tablet (400 mg total) by mouth at bedtime. 60 tablet 0   sucralfate (CARAFATE) 1 g tablet Take 1 g by mouth with breakfast, with lunch, and with evening meal.     traZODone (DESYREL) 100 MG tablet Take 1 tablet (100 mg total) by mouth at bedtime as needed for sleep. 60 tablet 0   No current facility-administered medications for this visit.      Psychiatric Specialty Exam: Review of Systems  Gastrointestinal:  Negative for abdominal pain.  Neurological:  Negative for headaches.  Psychiatric/Behavioral:  Positive for sleep disturbance. Negative for decreased concentration, dysphoric mood, hallucinations and suicidal ideas. The patient is nervous/anxious.  There were no vitals taken for this visit.There is no height or weight on file to calculate BMI.  General Appearance: Casual  Eye Contact:  Good  Speech:  Clear and Coherent  Volume:  Normal  Mood:  Euthymic  Affect:  Congruent  Thought Process:  Coherent  Orientation:  Full (Time, Place, and Person)  Thought Content: Logical   Suicidal Thoughts:  No  Homicidal Thoughts:  No  Memory:  Immediate;   Good Recent;   Good  Judgement:  Good  Insight:  Good  Psychomotor Activity:  NA  Concentration:  Concentration: Good  Recall:   NA  Fund of Knowledge: Good  Language: Good  Akathisia:  No  Handed:    AIMS (if indicated): not done  Assets:  Communication Skills Desire for Improvement Housing Leisure Time Resilience Social Support Transportation  ADL's:  Intact  Cognition: WNL  Sleep:  Fair   Screenings: GAD-7    Flowsheet Row Office Visit from 07/25/2022 in [facility] Video Visit from 01/16/2022 in [facility] Video Visit from 10/22/2021 in [facility] Video Visit from 07/16/2021 in [facility]  Total GAD-7 Score 4 2 4 18       PHQ2-9    Flowsheet Row Office Visit from 07/25/2022 in [facility] Office Visit from 07/18/2022 in [facility] - A Dept Of [place]. [facility] Office Visit from 03/27/2022 in [facility] - A Dept Of [place]. [facility] Video Visit from 01/16/2022 in [facility] Video Visit from 10/22/2021 in [facility]  PHQ-2 Total Score 0 0 0 0 1  PHQ-9 Total Score 6 -- -- 1 5      Flowsheet Row ED from 09/22/2023 in [facility] Emergency Department at [facility] ED from 09/09/2022 in [facility] Emergency Department at [facility]  C-SSRS RISK CATEGORY No Risk No Risk        Assessment and Plan:  Patient endorses improvement in mood but has continued anxiety. Patient reports that she is trying to use previously learned coping skills and continues to find benefit but is open to restarting therapy due to continued anxiety.   EKG 08/2023- Qtc 457 HR 82   Mood disorder otherwise specified vs Bipolar d/o Concern for hoarding disorder Hx GAD [place] burden Polypharmacy - Continue Lamictal to 150 mg daily  -  Continue Remeron 30mg  QHS - Continue gabapentin 400 mg 3 times daily -  Continue Seroquel 400mg  XR -  Continue  Trazodone  100mg  at bedtime (taking nightly) - Continue Hydroxyzine 50mg  BID PRN  - Continue Propanolol 20mg  at bedtime  rx chantix by PCP (patient smoking again)  Follow-up in approximately 6 weeks   Collaboration of Care: Collaboration of Care: Attengind provider Dr. Enedina Finner present for part of assessment  Patient/Guardian was advised Release of Information must be obtained prior to any record release in order to collaborate their care with an outside provider. Patient/Guardian was advised if they have not already done so to contact the registration department to sign all necessary forms in order for [place] to release information regarding their care.   Consent: Patient/Guardian gives verbal consent for treatment and assignment of benefits for services provided during this visit. Patient/Guardian expressed understanding and agreed to proceed.   PGY-4 [name], MD 03/12/2024, 2:33 PM

## 2024-03-23 ENCOUNTER — Telehealth: Payer: Self-pay | Admitting: Gastroenterology

## 2024-03-23 NOTE — Telephone Encounter (Signed)
 Pt questioning her recent medications and recommendations from Dr. Chales Abrahams. Recent office visit reviewed with Pt.  Pt requesting a refill for the compazine 25 mg suppository. Pt also questioning what medications that Dr. Chales Abrahams told her to replace by taking the Extra Strength Tylenol. Chart was reviewed and no documentation noted. Please review and advise

## 2024-03-23 NOTE — Telephone Encounter (Signed)
 Can also call in Compazine suppositories as per last clinic note Stop Voltaren.  Can use extra strength Tylenol if needed. Please see last note RG

## 2024-03-23 NOTE — Telephone Encounter (Signed)
 Dr Chales Abrahams please advise. You told her to wean off carafate as well

## 2024-03-23 NOTE — Telephone Encounter (Signed)
 That is right.  Lets wean off Carafate Continue Protonix 40 twice daily for now Can call in GI cocktail for as needed basis See last note RG

## 2024-03-23 NOTE — Telephone Encounter (Signed)
 Patient called and stated that she was needing a refill on her medication called protonix, sucralfate, and GI Cocktail. Please advise.

## 2024-03-23 NOTE — Telephone Encounter (Signed)
 Patient called and stated that she would like the nurse to call her back to inform her of what medication she is needing to stop and what medication she should take based off her last visit with Dr. Chales Abrahams. Patient is requesting a call back. Please advise.

## 2024-03-24 MED ORDER — AMBULATORY NON FORMULARY MEDICATION: 2 refills | Status: DC

## 2024-03-24 MED ORDER — ONDANSETRON 8 MG PO TBDP: 8.0000 mg | ORAL_TABLET | Freq: Three times a day (TID) | ORAL | 0 refills | Status: AC | PRN

## 2024-03-24 MED ORDER — PROCHLORPERAZINE 25 MG RE SUPP: RECTAL | 3 refills | Status: AC

## 2024-03-24 NOTE — Telephone Encounter (Signed)
 Zofran and suppository called to regular pharmacy

## 2024-03-24 NOTE — Telephone Encounter (Signed)
 [facility] GI cocktail and the patient was made aware to take tylenol 1 tablet 2 times a day

## 2024-03-24 NOTE — Telephone Encounter (Signed)
 See other TE patient made aware about things

## 2024-03-26 ENCOUNTER — Telehealth: Payer: Self-pay | Admitting: Gastroenterology

## 2024-03-26 NOTE — Telephone Encounter (Signed)
 Pt stated that she has recently had an increase in rectal bleeding. Pt stated that she thinks its the hemorrhoids protruding from her rectum. Pt stated that she has been noticing BRB in the stool and separate from the stool and at other times she notes dark red blood in the toilet. Pt was scheduled for an office visit on 04/01/2024 at 8:50 AM  to see Dr. Chales Abrahams.  Pt verbalized understanding with all questions answered.

## 2024-03-26 NOTE — Telephone Encounter (Signed)
 Inbound call from patient stating that she wants to discuss having hem banding. Patient states that the hemorrhoids will bleed and burn and sometimes she does not know that its bleeding until her clothes have blood on them. Patient states she had a banding in the past and it helped. Please advise.

## 2024-03-30 ENCOUNTER — Ambulatory Visit: Admitting: Podiatry

## 2024-04-01 ENCOUNTER — Ambulatory Visit: Admitting: Gastroenterology

## 2024-04-02 ENCOUNTER — Other Ambulatory Visit: Payer: Self-pay

## 2024-04-02 MED ORDER — PANTOPRAZOLE SODIUM 40 MG PO TBEC: 40.0000 mg | DELAYED_RELEASE_TABLET | Freq: Two times a day (BID) | ORAL | 0 refills | Status: AC

## 2024-04-18 ENCOUNTER — Encounter (HOSPITAL_BASED_OUTPATIENT_CLINIC_OR_DEPARTMENT_OTHER): Payer: Self-pay | Admitting: Emergency Medicine

## 2024-04-18 ENCOUNTER — Emergency Department (HOSPITAL_BASED_OUTPATIENT_CLINIC_OR_DEPARTMENT_OTHER)
Admission: EM | Admit: 2024-04-18 | Discharge: 2024-04-18 | Disposition: A | Discharge status: Home / Self Care | Attending: Emergency Medicine | Admitting: Emergency Medicine

## 2024-04-18 ENCOUNTER — Emergency Department (HOSPITAL_BASED_OUTPATIENT_CLINIC_OR_DEPARTMENT_OTHER): Admitting: Radiology

## 2024-04-18 ENCOUNTER — Other Ambulatory Visit: Payer: Self-pay

## 2024-04-18 DIAGNOSIS — Z79899 Other long term (current) drug therapy: Secondary | ICD-10-CM | POA: Diagnosis not present

## 2024-04-18 DIAGNOSIS — J45901 Unspecified asthma with (acute) exacerbation: Secondary | ICD-10-CM | POA: Insufficient documentation

## 2024-04-18 DIAGNOSIS — R0602 Shortness of breath: Secondary | ICD-10-CM | POA: Diagnosis present

## 2024-04-18 DIAGNOSIS — F17200 Nicotine dependence, unspecified, uncomplicated: Secondary | ICD-10-CM | POA: Insufficient documentation

## 2024-04-18 DIAGNOSIS — I10 Essential (primary) hypertension: Secondary | ICD-10-CM | POA: Insufficient documentation

## 2024-04-18 LAB — RESP PANEL BY RT-PCR (RSV, FLU A&B, COVID)  RVPGX2
Influenza A by PCR: NEGATIVE
Influenza B by PCR: NEGATIVE
Resp Syncytial Virus by PCR: NEGATIVE
SARS Coronavirus 2 by RT PCR: NEGATIVE

## 2024-04-18 MED ORDER — ALBUTEROL SULFATE (5 MG/ML) 0.5% IN NEBU: 2.5000 mg | INHALATION_SOLUTION | Freq: Four times a day (QID) | RESPIRATORY_TRACT | 1 refills | Status: DC | PRN

## 2024-04-18 MED ORDER — DEXAMETHASONE SODIUM PHOSPHATE 10 MG/ML IJ SOLN
10.0000 mg | Freq: Once | INTRAMUSCULAR | Status: AC
  Administered 2024-04-18: 10 mg via INTRAMUSCULAR
  Filled 2024-04-18: qty 1

## 2024-04-18 MED ORDER — IPRATROPIUM-ALBUTEROL 0.5-2.5 (3) MG/3ML IN SOLN
6.0000 mL | Freq: Once | RESPIRATORY_TRACT | Status: AC
  Administered 2024-04-18: 6 mL via RESPIRATORY_TRACT
  Filled 2024-04-18: qty 6

## 2024-04-18 MED ORDER — ALBUTEROL SULFATE HFA 108 (90 BASE) MCG/ACT IN AERS: 2.0000 | INHALATION_SPRAY | RESPIRATORY_TRACT | Status: DC | PRN

## 2024-04-18 MED ORDER — PREDNISONE 10 MG PO TABS: 50.0000 mg | ORAL_TABLET | Freq: Every day | ORAL | 0 refills | Status: DC

## 2024-04-18 NOTE — ED Provider Notes (Signed)
 [place] EMERGENCY DEPARTMENT AT [facility] Provider Note   CSN: [id number] Arrival date & time: 04/18/24  4098     History  Chief Complaint  Patient presents with   Shortness of Breath    Jasmine Jackson is a 53 y.o. female.  HPI     SUBJECTIVE:  Jasmine Jackson is a 53 y.o. female seen urgently with exacerbation of asthma for 4 days. Wheezing is described as moderate. Associated symptoms:dry cough, chest tightness/heaviness. Current asthma medications: Proventil  MDI (or generic equivalent). Patient has smoke cigarettes.  Patient indicates that she normally has to take inhaler once in a while.  With this episode, she has been taking inhaler at least 4 times a day and receives transient relief.   Patient has past medical history of hypertension.  She has tobacco use disorder, has cut back to half a pack a day.   She is Patient denies any cardiac disease history.  Pt has no hx of PE, DVT and denies any exogenous hormone (testosterone / estrogen) use, long distance travels or surgery in the past 6 weeks, active cancer, recent immobilization.  Patient had come in with wheezing per nursing note.  She had received breathing treatment prior to my assessment.  Patient states that she is feeling a lot better.  She does not feel like she is wheezing.  She is feeling the best she has in 4 days.  Patient also states that the trigger likely was pollen, she also had some scratchy throat and tearing of her eye, which have resolved, but her wheezing has been persistent.  Home Medications Prior to Admission medications   Medication Sig Start Date End Date Taking? Authorizing Provider  predniSONE (DELTASONE) 10 MG tablet Take 5 tablets (50 mg total) by mouth daily. 04/19/24  Yes Deatra Face, MD  albuterol  (VENTOLIN  HFA) 108 (90 Base) MCG/ACT inhaler Inhale 2 puffs into the lungs every 6 (six) hours as needed for wheezing or shortness of breath. 09/07/21   Zoanne Hinders, MD  AMBULATORY  NON FORMULARY MEDICATION Medication Name: GI Cocktail 90 ml 2% viscous lidocaine 90 ml bentyl  10mg /72ml 270 ml Mylanta SIG 10 ml bid prn gas and stomach pain-do not use more than 3 days in a row 03/24/24   Lajuan Pila, MD  amLODipine  (NORVASC ) 5 MG tablet TAKE 1 TABLET EVERY DAY Patient taking differently: TAKE 1 10 mg TABLET EVERY DAY 07/18/22   Allana Arabia, MD  cyclobenzaprine  (FEXMID ) 7.5 MG tablet TAKE 1 TABLET(7.5 MG) BY MOUTH THREE TIMES DAILY AS NEEDED FOR MUSCLE SPASMS 11/01/22   Allana Arabia, MD  diclofenac  (VOLTAREN ) 75 MG EC tablet TAKE 1 TABLET(75 MG) BY MOUTH TWICE DAILY 02/16/24   Floyce Hutching, DPM  dicyclomine  (BENTYL ) 10 MG capsule Take 1 capsule (10 mg total) by mouth in the morning, at noon, in the evening, and at bedtime. Please keep your upcoming appointment with Dr. Venice Gillis on 2-27 for any further refills. 01/14/24   Lajuan Pila, MD  gabapentin  (NEURONTIN ) 400 MG capsule Take 1 capsule (400 mg total) by mouth 3 (three) times daily. 01/23/24   McQuilla, Jai B, MD  hydrochlorothiazide  (HYDRODIURIL ) 25 MG tablet TAKE 1 TABLET(25 MG) BY MOUTH DAILY 01/16/23   Allana Arabia, MD  hydrOXYzine  (ATARAX ) 50 MG tablet Take 1 tablet (50 mg total) by mouth 2 (two) times daily as needed. 03/12/24   McQuilla, Jai B, MD  lamoTRIgine  (LAMICTAL ) 150 MG tablet Take 1 tablet (150 mg total) by mouth daily. 03/12/24  McQuilla, Jai B, MD  linaclotide  (LINZESS ) 290 MCG CAPS capsule Take 1 capsule (290 mcg total) by mouth daily before breakfast. 02/10/24   Lajuan Pila, MD  linaclotide  (LINZESS ) 290 MCG CAPS capsule Take 1 capsule (290 mcg total) by mouth daily before breakfast. 02/19/24   Lajuan Pila, MD  methylPREDNISolone  (MEDROL  DOSEPAK) 4 MG TBPK tablet 6 day dose pack - take as directed 12/26/22   Floyce Hutching, DPM  mirtazapine  (REMERON  SOL-TAB) 30 MG disintegrating tablet DISSOLVE 1 TABLET(15 MG) ON THE TONGUE AT BEDTIME 03/12/24   McQuilla, Jai B, MD  ondansetron  (ZOFRAN -ODT) 8 MG disintegrating  tablet Take 1 tablet (8 mg total) by mouth every 8 (eight) hours as needed for nausea or vomiting. 03/24/24   Lajuan Pila, MD  pantoprazole  (PROTONIX ) 40 MG tablet Take 1 tablet (40 mg total) by mouth 2 (two) times daily. 04/02/24   Lajuan Pila, MD  prochlorperazine  (COMPAZINE ) 25 MG suppository Place 1 suppository (25 mg total) rectally every 12 (twelve) hours as needed for nausea or vomiting. 02/19/24   Lajuan Pila, MD  prochlorperazine  (COMPAZINE ) 25 MG suppository Use 1 suppository rectally every 6-8 hours as needed nausea/vomiting 03/24/24   Lajuan Pila, MD  propranolol  (INDERAL ) 20 MG tablet Take 1 tablet (20 mg total) by mouth at bedtime. 03/12/24 06/10/24  McQuilla, Jai B, MD  QUEtiapine  (SEROQUEL  XR) 400 MG 24 hr tablet Take 1 tablet (400 mg total) by mouth at bedtime. 01/23/24   McQuilla, Jai B, MD  sucralfate (CARAFATE) 1 g tablet Take 1 g by mouth with breakfast, with lunch, and with evening meal.    [provider]  traZODone  (DESYREL ) 100 MG tablet Take 1 tablet (100 mg total) by mouth at bedtime as needed for sleep. 03/12/24   McQuilla, Jai B, MD      Allergies    Laree Platts ] and Banana    Review of Systems   Review of Systems  All other systems reviewed and are negative.   Physical Exam Updated Vital Signs BP (!) 141/84   Pulse 84   Temp 98.3 F (36.8 C) (Oral)   Resp (!) 22   SpO2 99% Comment: ra Physical Exam Vitals and nursing note reviewed.  Constitutional:      Appearance: She is well-developed.  HENT:     Head: Atraumatic.  Cardiovascular:     Rate and Rhythm: Normal rate.  Pulmonary:     Effort: Pulmonary effort is normal.     Breath sounds: No wheezing.  Musculoskeletal:     Cervical back: Normal range of motion and neck supple.  Skin:    General: Skin is warm and dry.  Neurological:     Mental Status: She is alert and oriented to person, place, and time.     ED Results / Procedures / Treatments   Labs (all labs ordered are listed, but  only abnormal results are displayed) Labs Reviewed  RESP PANEL BY RT-PCR (RSV, FLU A&B, COVID)  [id number]    EKG EKG Interpretation Date/Time:  Sunday April 18 2024 07:21:31 EDT Ventricular Rate:  81 PR Interval:  147 QRS Duration:  108 QT Interval:  410 QTC Calculation: 476 R Axis:   -13  Text Interpretation: Sinus rhythm Probable left atrial enlargement Low voltage, precordial leads RSR' in V1 or V2, right VCD or RVH No acute changes No significant change since last tracing Confirmed by Deatra Face [phone]) on 04/18/2024 8:46:17 AM  Radiology DG Chest 2 View Result Date: 04/18/2024 CLINICAL DATA:  History of asthma. Shortness of breath and cough for 4 days. EXAM: CHEST - 2 VIEW COMPARISON:  09/22/2023 FINDINGS: Heart size and mediastinal contours are unremarkable. Central airway thickening with peribronchial cuffing in the hilar regions. Chronic coarsened interstitial markings appear unchanged. No superimposed pleural effusion, interstitial edema or airspace consolidation. The visualized osseous structures are intact. No acute osseous findings. IMPRESSION: 1. Central airway thickening with peribronchial cuffing in the hilar regions compatible with bronchitis. 2. Chronic coarsened interstitial markings appear unchanged. Electronically Signed   By: Kimberley Penman M.D.   On: 04/18/2024 07:47    Procedures Procedures    Medications Ordered in ED Medications  albuterol  (VENTOLIN  HFA) 108 (90 Base) MCG/ACT inhaler 2 puff (has no administration in time range)  dexamethasone (DECADRON) injection 10 mg (has no administration in time range)  ipratropium-albuterol  (DUONEB) 0.5-2.5 (3) MG/3ML nebulizer solution 6 mL (6 mLs Nebulization Given 04/18/24 1610)    ED Course/ Medical Decision Making/ A&P                                 Medical Decision Making Amount and/or Complexity of Data Reviewed Radiology: ordered.  Risk Prescription drug management.   OBJECTIVE:  The patient appears  alert, well appearing, and in no distress and oriented to person, place, and time. ENT: ENT exam normal, no neck nodes or sinus tenderness CHEST:clear to auscultation, no wheezes, rales or rhonchi, symmetric air entry  Differential diagnosis considered for this patient includes asthma exacerbation, pneumonia, viral illness, seasonal allergies. Low suspicion for ACS, PE.  EKG show will be ordered.  ASSESSMENT:  Asthma - acute exacerbation  PLAN:  IM dexamethasone, oral prednisone to go home. Smoking cessation instruction/counseling given:  counseled patient on the dangers of tobacco use, advised patient to stop smoking, and reviewed strategies to maximize success   RX per orders - Use bronchodilator MDI 2 puff q4h prn, steroid MDI regularly to prevent asthma and oral steroid taper. Final Clinical Impression(s) / ED Diagnoses Final diagnoses:  Asthma exacerbation, mild    Rx / DC Orders ED Discharge Orders          Ordered    predniSONE (DELTASONE) 10 MG tablet  Daily        04/18/24 0847              Deatra Face, MD 04/18/24 [phone]

## 2024-04-18 NOTE — ED Triage Notes (Signed)
 Pt caox4, ambulatory c/o SOB x4 days.PMH asthma, states she has had to use inhaler approx 3-4x daily but normally only uses every few months. Wheezing on arrival to ED.

## 2024-04-18 NOTE — Discharge Instructions (Signed)
 We saw you in the ER for your asthma related complains. We gave you some breathing treatments in the ER, and seems like your symptoms have improved. Please take albuterol as needed every 4 hours. Please take the medications prescribed. Please refrain from smoking or smoke exposure. Please see a primary care doctor in 1 week. Return to the ER if your symptoms worsen.

## 2024-04-20 ENCOUNTER — Ambulatory Visit: Admitting: Podiatry

## 2024-04-24 NOTE — ED Notes (Signed)
 Spoke with Teacher, adult education at [facility] on [address]. She is filling the original prescription for albuterol  nebulizer (concentrated form had been given to patient) premix. Attempted to call pt and no answer, voicemail full .

## 2024-04-24 NOTE — ED Notes (Signed)
 Returned call to patient regarding a question regarding her prescriptions. No answer.

## 2024-05-04 ENCOUNTER — Telehealth (HOSPITAL_COMMUNITY): Payer: Self-pay

## 2024-05-04 NOTE — Telephone Encounter (Addendum)
 Hello,   Pharmacy is requesting for a refill of Gabapentin  and trazadone to be sent in.  Pt last seen:03/12/24   No future apps scheduled.

## 2024-05-06 ENCOUNTER — Other Ambulatory Visit: Payer: Self-pay

## 2024-05-06 ENCOUNTER — Encounter (HOSPITAL_BASED_OUTPATIENT_CLINIC_OR_DEPARTMENT_OTHER): Payer: Self-pay | Admitting: Emergency Medicine

## 2024-05-06 ENCOUNTER — Emergency Department (HOSPITAL_BASED_OUTPATIENT_CLINIC_OR_DEPARTMENT_OTHER)
Admission: EM | Admit: 2024-05-06 | Discharge: 2024-05-06 | Disposition: A | Discharge status: Home / Self Care | Attending: Emergency Medicine | Admitting: Emergency Medicine

## 2024-05-06 DIAGNOSIS — R21 Rash and other nonspecific skin eruption: Secondary | ICD-10-CM | POA: Insufficient documentation

## 2024-05-06 DIAGNOSIS — J45909 Unspecified asthma, uncomplicated: Secondary | ICD-10-CM | POA: Diagnosis not present

## 2024-05-06 DIAGNOSIS — Z7951 Long term (current) use of inhaled steroids: Secondary | ICD-10-CM | POA: Insufficient documentation

## 2024-05-06 DIAGNOSIS — I10 Essential (primary) hypertension: Secondary | ICD-10-CM | POA: Diagnosis not present

## 2024-05-06 DIAGNOSIS — Z79899 Other long term (current) drug therapy: Secondary | ICD-10-CM | POA: Diagnosis not present

## 2024-05-06 DIAGNOSIS — Z7952 Long term (current) use of systemic steroids: Secondary | ICD-10-CM | POA: Insufficient documentation

## 2024-05-06 MED ORDER — TRIAMCINOLONE ACETONIDE 0.1 % EX CREA: 1.0000 | TOPICAL_CREAM | Freq: Two times a day (BID) | CUTANEOUS | 0 refills | Status: DC

## 2024-05-06 NOTE — Discharge Instructions (Signed)
 You were seen today for rash to the right upper chest.  Believe this is most likely a bullous lesion that will be helped with steroid creams at this time as it does not fit the typical presentation of shingles.  Will have you continue to use this cream and if this does not get better, follow-up with either PCP and or return to the ED or urgent care for reevaluation as shingles treatment may be required at that time.  Ensure that you continue to use Tylenol  and ibuprofen  as pain relief.  Take Tylenol  (acetominophen)  650mg  every 4-6 hours, as needed for pain or fever. Do not take more than 4,000 mg in a 24-hour period. As this may cause liver damage. While this is rare, if you begin to develop yellowing of the skin or eyes, stop taking and return to ER immediately.  Take Ibuprofen  400mg  every 4-6 hours for pain or fever, not exceeding 3,200 mg per day as more than 3,200mg  can cause Stomach irritation, dizziness, kidney issues with long-term use.  Also recommend you follow-up with a dermatologist to evaluate these lesions as well.  I provided their information here.  Return to the ED if you begin to have any new or worsening symptoms including worsening rash, fever, lethargy, visual changes, uncontrolled pain.

## 2024-05-06 NOTE — ED Provider Notes (Signed)
 [place] EMERGENCY DEPARTMENT AT [facility] Provider Note   CSN: [id number] Arrival date & time: 05/06/24  1750     History  Chief Complaint  Patient presents with   Rash    Jasmine Jackson is a 53 y.o. female.   Rash Patient is a 53 year old female presents the ED today with concerns of rash which she believes might be shingles that developed earlier this morning approximately 14 hours ago.  States that she developed a red patch that then became painful and then developed "balloon like" lesions that have began to rupture.  States she has gotten 2 vaccinations for shingles.  Does not report history of chickenpox.  States this is also been accompanied with a headache but does not have photophobia.   Denies any new changes to detergents, soaps, lotions, perfumes, new exposures, no new medications Denies lesions on lips, mouth, vaginal area. Denies pruritic or painful prodrome and paresthesias. Denies fever, visual changes, shortness of breath, abdominal pain, nausea, vomiting, dysuria, numbness, weakness, tingling     Home Medications Prior to Admission medications   Medication Sig Start Date End Date Taking? Authorizing Provider  triamcinolone  cream (KENALOG ) 0.1 % Apply 1 Application topically 2 (two) times daily. 05/06/24  Yes Dorissa Stinnette S, PA-C  albuterol  (PROVENTIL ) (5 MG/ML) 0.5% nebulizer solution Take 0.5 mLs (2.5 mg total) by nebulization every 6 (six) hours as needed for wheezing or shortness of breath. 04/18/24   Deatra Face, MD  albuterol  (VENTOLIN  HFA) 108 (90 Base) MCG/ACT inhaler Inhale 2 puffs into the lungs every 6 (six) hours as needed for wheezing or shortness of breath. 09/07/21   Zoanne Hinders, MD  AMBULATORY NON FORMULARY MEDICATION Medication Name: GI Cocktail 90 ml 2% viscous lidocaine 90 ml bentyl  10mg /31ml 270 ml Mylanta SIG 10 ml bid prn gas and stomach pain-do not use more than 3 days in a row 03/24/24   Lajuan Pila, MD  amLODipine  (NORVASC )  5 MG tablet TAKE 1 TABLET EVERY DAY Patient taking differently: TAKE 1 10 mg TABLET EVERY DAY 07/18/22   Allana Arabia, MD  cyclobenzaprine  (FEXMID ) 7.5 MG tablet TAKE 1 TABLET(7.5 MG) BY MOUTH THREE TIMES DAILY AS NEEDED FOR MUSCLE SPASMS 11/01/22   Allana Arabia, MD  diclofenac  (VOLTAREN ) 75 MG EC tablet TAKE 1 TABLET(75 MG) BY MOUTH TWICE DAILY 02/16/24   Floyce Hutching, DPM  dicyclomine  (BENTYL ) 10 MG capsule Take 1 capsule (10 mg total) by mouth in the morning, at noon, in the evening, and at bedtime. Please keep your upcoming appointment with Dr. Venice Gillis on 2-27 for any further refills. 01/14/24   Lajuan Pila, MD  gabapentin  (NEURONTIN ) 400 MG capsule Take 1 capsule (400 mg total) by mouth 3 (three) times daily. 01/23/24   McQuilla, Jai B, MD  hydrochlorothiazide  (HYDRODIURIL ) 25 MG tablet TAKE 1 TABLET(25 MG) BY MOUTH DAILY 01/16/23   Allana Arabia, MD  hydrOXYzine  (ATARAX ) 50 MG tablet Take 1 tablet (50 mg total) by mouth 2 (two) times daily as needed. 03/12/24   McQuilla, Jai B, MD  lamoTRIgine  (LAMICTAL ) 150 MG tablet Take 1 tablet (150 mg total) by mouth daily. 03/12/24   McQuilla, Jai B, MD  linaclotide  (LINZESS ) 290 MCG CAPS capsule Take 1 capsule (290 mcg total) by mouth daily before breakfast. 02/10/24   Lajuan Pila, MD  linaclotide  (LINZESS ) 290 MCG CAPS capsule Take 1 capsule (290 mcg total) by mouth daily before breakfast. 02/19/24   Lajuan Pila, MD  methylPREDNISolone  (MEDROL  DOSEPAK) 4 MG TBPK tablet  6 day dose pack - take as directed 12/26/22   Floyce Hutching, DPM  mirtazapine  (REMERON  SOL-TAB) 30 MG disintegrating tablet DISSOLVE 1 TABLET(15 MG) ON THE TONGUE AT BEDTIME 03/12/24   McQuilla, Jai B, MD  ondansetron  (ZOFRAN -ODT) 8 MG disintegrating tablet Take 1 tablet (8 mg total) by mouth every 8 (eight) hours as needed for nausea or vomiting. 03/24/24   Lajuan Pila, MD  pantoprazole  (PROTONIX ) 40 MG tablet Take 1 tablet (40 mg total) by mouth 2 (two) times daily. 04/02/24   Lajuan Pila, MD  predniSONE  (DELTASONE ) 10 MG tablet Take 5 tablets (50 mg total) by mouth daily. 04/19/24   Deatra Face, MD  prochlorperazine  (COMPAZINE ) 25 MG suppository Place 1 suppository (25 mg total) rectally every 12 (twelve) hours as needed for nausea or vomiting. 02/19/24   Lajuan Pila, MD  prochlorperazine  (COMPAZINE ) 25 MG suppository Use 1 suppository rectally every 6-8 hours as needed nausea/vomiting 03/24/24   Lajuan Pila, MD  propranolol  (INDERAL ) 20 MG tablet Take 1 tablet (20 mg total) by mouth at bedtime. 03/12/24 06/10/24  McQuilla, Jai B, MD  QUEtiapine  (SEROQUEL  XR) 400 MG 24 hr tablet Take 1 tablet (400 mg total) by mouth at bedtime. 01/23/24   McQuilla, Jai B, MD  sucralfate (CARAFATE) 1 g tablet Take 1 g by mouth with breakfast, with lunch, and with evening meal.    [provider]  traZODone  (DESYREL ) 100 MG tablet Take 1 tablet (100 mg total) by mouth at bedtime as needed for sleep. 03/12/24   McQuilla, Jai B, MD      Allergies    Asa [aspirin ] and Banana    Review of Systems   Review of Systems  Skin:  Positive for rash.  All other systems reviewed and are negative.   Physical Exam Updated Vital Signs BP (!) 140/80 (BP Location: Right Arm)   Pulse 86   Temp 98 F (36.7 C) (Oral)   Resp 16   Ht 5\' 5"  (1.651 m)   Wt 101.6 kg   SpO2 100%   BMI 37.27 kg/m  Physical Exam Vitals and nursing note reviewed.  Constitutional:      General: She is not in acute distress.    Appearance: Normal appearance. She is not ill-appearing.  HENT:     Head: Normocephalic and atraumatic.  Eyes:     General: No scleral icterus.       Right eye: No discharge.        Left eye: No discharge.     Extraocular Movements: Extraocular movements intact.     Conjunctiva/sclera: Conjunctivae normal.  Cardiovascular:     Rate and Rhythm: Normal rate and regular rhythm.     Pulses: Normal pulses.     Heart sounds: Normal heart sounds. No murmur heard.    No friction rub. No  gallop.  Pulmonary:     Effort: Pulmonary effort is normal. No respiratory distress.     Breath sounds: Normal breath sounds.  Abdominal:     General: Abdomen is flat. There is no distension.     Palpations: Abdomen is soft.     Tenderness: There is no abdominal tenderness. There is no guarding.  Skin:    General: Skin is warm and dry.     Findings: Lesion and rash present.  Neurological:     General: No focal deficit present.     Mental Status: She is alert. Mental status is at baseline.  Psychiatric:  Mood and Affect: Mood normal.     ED Results / Procedures / Treatments   Labs (all labs ordered are listed, but only abnormal results are displayed) Labs Reviewed - No data to display  EKG None  Radiology No results found.  Procedures Procedures    Medications Ordered in ED Medications - No data to display  ED Course/ Medical Decision Making/ A&P                                 Medical Decision Making  This patient is a 53 year old female who presents to the ED for concern of bullous rash that began today approximately 14 hours ago.  Started as a red rash over her right breast and then became painful and developed bulla, few of them rupturing.  No other complaints otherwise.  On physical exam, patient is in no acute distress, afebrile, alert and orient x 4, speaking in full sentences, nontachypneic, nontachycardic.  Picture of below noted in chart.  Exam is otherwise unremarkable with no other rashes noted on exam, LCTAB, no murmurs, no abdominal tenderness.  Nikolsky negative.  With atypical presentation, red patch appearing before pain and being nonpruritic with no new exposures/no new medications and having had 2 previous shingles vaccines and no history of chickenpox, I have lower suspicion for shingles at this time.  With patient being below the age of 29, no history of tumors and not taking Lasix and having been recently treated with prednisone  for any asthma  exacerbation and being nonpruritic, Nikolsky negative, lower suspicion for bullous pemphigoid and pemphigus vulgaris.  With local distribution, no mucosal involvement and no systemic symptoms do not suspect SJS or TE N.  Consult attending, Dr. Ranelle Buys who saw patient as well and collectively decided to treat with topical steroids and have her follow-up with dermatology for further evaluation.  As patient is currently not having any systemic symptoms.  Will have the patient continue to monitor symptoms at home and follow-up with dermatology for further evaluation.  Provided strict return to ED precautions.  Patient vital signs have remained stable throughout the course of patient's time in the ED. Low suspicion for any other emergent pathology at this time. I believe this patient is safe to be discharged. Provided strict return to ER precautions. Patient expressed agreement and understanding of plan. All questions were answered.  Differential diagnoses prior to evaluation: The emergent differential diagnosis includes, but is not limited to,  shingles, bullous pemphigoid, pemphigus vulgaris, bullous empetigo necrotizing fasciitis, drug-induced bullous, phytophotodermatitis, dermatitis herpetiformis, contact dermatitis. This is not an exhaustive differential.   Past Medical History / Co-morbidities / Social History: HTN, heart murmur, GERD, depression, bipolar 1 disorder, anxiety, IBS, asthma  Additional history: Chart reviewed. Pertinent results include:   Last seen in the ED on/27/125 for an asthma exacerbation provided prednisone  at that time.  Lab Tests/Imaging studies: No labs or imaging ordered at this time, with no systemic symptoms considered baseline labs but do not believe it is warranted at this time.    Medications: I ordered medication including triamcinolone .  I have reviewed the patients home medicines and have made adjustments as needed.    Disposition: After consideration of the  diagnostic results and the patients response to treatment, I feel that the patient would benefit from discharge return as above.   emergency department workup does not suggest an emergent condition requiring admission or immediate intervention beyond what has  been performed at this time. The plan is: Follow-up with dermatology, triamcinolone  cream, return for new or worsening symptoms,. The patient is safe for discharge and has been instructed to return immediately for worsening symptoms, change in symptoms or any other concerns.   Final Clinical Impression(s) / ED Diagnoses Final diagnoses:  Rash    Rx / DC Orders ED Discharge Orders          Ordered    triamcinolone  cream (KENALOG ) 0.1 %  2 times daily        05/06/24 2222              Hayes Lipps, PA-C 05/06/24 2340    Rafael Bun A, DO 05/07/24 1352

## 2024-05-06 NOTE — ED Triage Notes (Signed)
 Pt via pov from home with rash on right chest that she thinks is shingles. It started this morning; she states it looked different this morning. Pt had shingles vaccine x 2. Pt alert & oriented, nad noted.

## 2024-05-12 ENCOUNTER — Other Ambulatory Visit (HOSPITAL_COMMUNITY): Payer: Self-pay | Admitting: Student in an Organized Health Care Education/Training Program

## 2024-05-12 DIAGNOSIS — G4700 Insomnia, unspecified: Secondary | ICD-10-CM

## 2024-05-12 DIAGNOSIS — F39 Unspecified mood [affective] disorder: Secondary | ICD-10-CM

## 2024-05-12 DIAGNOSIS — F411 Generalized anxiety disorder: Secondary | ICD-10-CM

## 2024-05-12 MED ORDER — GABAPENTIN 400 MG PO CAPS: 400.0000 mg | ORAL_CAPSULE | Freq: Three times a day (TID) | ORAL | 0 refills | Status: DC

## 2024-05-12 MED ORDER — TRAZODONE HCL 100 MG PO TABS: 100.0000 mg | ORAL_TABLET | Freq: Every evening | ORAL | 0 refills | Status: DC | PRN

## 2024-05-12 NOTE — Telephone Encounter (Signed)
 Ok, I will refill for her, but she will need a f/u appt with one of the incoming residents. Thank you.

## 2024-05-13 ENCOUNTER — Ambulatory Visit: Payer: 59 | Admitting: Gastroenterology

## 2024-05-13 NOTE — Telephone Encounter (Signed)
 Thank you! I let Pt know that she needs to schedule a follow up app. After 5/23

## 2024-05-14 ENCOUNTER — Telehealth (HOSPITAL_COMMUNITY): Admitting: Student in an Organized Health Care Education/Training Program

## 2024-05-14 MED ORDER — QUETIAPINE FUMARATE ER 400 MG PO TB24
400.0000 mg | ORAL_TABLET | Freq: Every day | ORAL | 0 refills | Status: DC
Start: 2024-05-14 — End: 2024-06-18

## 2024-05-14 NOTE — Progress Notes (Signed)
 Tech difficulties, will reschedule for 6/6. Pt did need refill for Seroquel , 30 day supply sent due to insurance request for this number of tablets.   PGY-4 Marvetta Sloan, MD

## 2024-05-16 ENCOUNTER — Other Ambulatory Visit: Payer: Self-pay | Admitting: Podiatry

## 2024-05-18 ENCOUNTER — Other Ambulatory Visit: Payer: Self-pay | Admitting: Gastroenterology

## 2024-05-18 ENCOUNTER — Telehealth (HOSPITAL_COMMUNITY): Payer: Self-pay

## 2024-05-18 ENCOUNTER — Telehealth (HOSPITAL_COMMUNITY): Payer: Self-pay | Admitting: *Deleted

## 2024-05-18 NOTE — Telephone Encounter (Signed)
 Patient called asking for refill of Seroquel  50mg . States that her pharmacy only have her 5 days not 30. Did see a note about refill being sent but had some kind of technical error? Message sent to MD for clarification.

## 2024-05-18 NOTE — Telephone Encounter (Signed)
 Rx sent in from [facility], asking for  a refill of QUEtiapine  to be sent in to pharmacy.  Last fill was 02/24/2024. I tried to reach out to this pt, with no response and will do so later on today in another attempt presuming she may be sleep. But please let me know how I can assist.      JNL, CMA

## 2024-05-19 NOTE — Telephone Encounter (Signed)
 Pt Seroquel  50mg  tablets were dc'd on 01/23/2024. She is not to be taking more than the 400mg  tablet of Seroquel . This was discussed with patient at appts in 12/2023 and 02/2024.

## 2024-05-19 NOTE — Telephone Encounter (Signed)
 Pt received refill of Seroquel  400mg  on 5/23.

## 2024-05-28 ENCOUNTER — Telehealth (HOSPITAL_COMMUNITY): Admitting: Student in an Organized Health Care Education/Training Program

## 2024-05-28 ENCOUNTER — Encounter (HOSPITAL_COMMUNITY): Payer: Self-pay

## 2024-05-28 NOTE — Progress Notes (Deleted)
 Virtual Visit via Video Note  I connected with Jasmine Jackson on 05/28/24 at  9:30 AM EDT by a video enabled telemedicine application and verified that I am speaking with the correct person using two identifiers.  Location: Patient: Home Provider: Office   I discussed the limitations of evaluation and management by telemedicine and the availability of in person appointments. The patient expressed understanding and agreed to proceed.     I discussed the assessment and treatment plan with the patient. The patient was provided an opportunity to ask questions and all were answered. The patient agreed with the plan and demonstrated an understanding of the instructions.   The patient was advised to call back or seek an in-person evaluation if the symptoms worsen or if the condition fails to improve as anticipated.  I provided 25 minutes of non-face-to-face time during this encounter.   Carl Bleecker B Armoni Depass, MD  [facility] MD/PA/NP OP Progress Note  05/28/2024 8:34 AM Jasmine Jackson  MRN:  [id number]  Chief Complaint:  No chief complaint on file.  HPI: Davan Hark is a 53 yo with PPH unspecified mood disorder, PTSD, anxiety, panic attacks, and depression.  Patient reports she has been compliant with the following medication regimen:   -  Lamictal  150 mg daily  -  Remeron  30mg  QHS -  gabapentin  400 mg 3 times daily -  Seroquel  400mg  XR -  Trazodone   100mg  at bedtime (taking nightly) - Hydroxyzine  50mg  TID (2x/ day most often) - Propanolol 20mg  BID   Pt reports that she is doing ok, but she has started back smoking cigarettes due to her grandmother's death and then her dog, but she will be restarting Chantix soon. Pt reports that she is smoking about 10 cigs/ day. Pt reports that her mood has improved, she has been getting out of the house more and is walking 28mi/ day. Patient reports that she is grieving her grandmother's death well. Patient reports that her appetite has decreased some, but this  is due to [place] and is intentional. Patient reports that her sleep has been poor the last 3 weeks. Patient reports that she is averaging 4-4.5 hours. Patient reports that she feels tired at night, but has trouble falling asleep at night due to anxious thoughts. Patient reports that she may on occasion fall asleep better and sleep 7 hours, but at least 4 days/ week she has trouble falling asleep. Patient reports that she has good energy during the day.   Patient reports that she is still having anxiety day or night. Patient reports that her anxiety is more general. Pt [facility] and they have a 2 mon wait for anxiety. Patient denies SI, HI, and AVH.    Patient reports she had a in lab sleep study done years ago due to her snoring, but she did not have sleep apnea.  Caffeine: none Etoh: denies Pt reports she will drink sugared juice up until bed time.   Pt son is doing well in his program.   Visit Diagnosis:  No diagnosis found.        Past Psychiatric History: Last visit 09/2022-concern for extremely high dose of Seroquel  and adverse side effects due to this, decrease Seroquel  XR to 300 mg then 200 mg. Patient called later endorsing problems with sleep, started patient on trazodone  25 to 50 mg nightly as needed   02/2023-continue decrease of Seroquel .  Seroquel  decreased from 200 mg to 100 mg and continued Seroquel  XR  400 mg.  Patient endorses on arrival that she had been taking Seroquel  XR 400 and had decreased the regular Seroquel  to the previously mentioned 200.  Also concern that patient does not appear to recall a true manic episode however patient was very nervous about titrating down his Seroquel .  Discussion had with patient about concern for polypharmacy and adverse metabolic side effects especially from being on Seroquel . 03/2023- Mood stable, continues to appear in remission.  Labs also indicated that patient was prediabetic and had hyperlipidemia, patient was okay  with continuing down on Seroquel  after discussion that this medication could increase or worsen risk for these diseases. 07/2023- Patient rx of Seroquel  were old and patient was again taking higher doses and not the medication changes. Patient had to be decreased back to Seroquel  XR 600mg  at bedtime and Serqoeul 50mg  at bedtime. Continued Lamictal  and remeron  as they were. Patient had some stressors about her mother's health, but otherwise had a fair mood and no other anxiety outside of her mother's health.  08/2023- Patient endorsing more panic attacks, but not having to call EMS as in the past, started on Propanolol 10mg  daily PRN and 10mg  at bedtime as her anxiety is keeping her up at night. Patient trying to use coping skills as well. No other medication adjustments made. 10/2023-notes improvement in her anxiety, but continues to have anxiety disrupting her sleep and having poor concentration. Decrease Seroquel  from 600 to 400mg  and change Propanolol 10 BID PRN to 20mg  at bedtime. All other meds the same. 12/2023- Anxiety had some improvements, less panic attacks, but having some anxiety associated with insomnia. Discontinued 50mg  Seroquel .   02/2024- Patient endorses improvement in mood but has continued anxiety. Pt interested in therapy. Looking for therapist.  Past Medical History:  Past Medical History:  Diagnosis Date   Anemia    06/19/21-pt has no recollection of this dx   Anxiety    Anxiety and depression    Asthma    Bipolar 1 disorder (HCC)    Depression    Excessive daytime sleepiness 11/18/2017   GERD (gastroesophageal reflux disease)    Heart murmur    Hemorrhoids    Hiatal hernia    Hypertension    Internal hemorrhoids    Irritable bowel disease 2014   Toe injury 11/18/2017    Past Surgical History:  Procedure Laterality Date   COLONOSCOPY  2021   HEMORRHOID SURGERY     INGUINAL HERNIA REPAIR     PARTIAL HYSTERECTOMY     fibroids   TONSILLECTOMY     UPPER  GASTROINTESTINAL ENDOSCOPY      Family Psychiatric History: Son-autism    Family History:  Family History  Problem Relation Age of Onset   Breast cancer Maternal Aunt        x4   Stomach cancer Maternal Aunt    Prostate cancer Maternal Uncle        x2   Breast cancer Maternal Grandmother    Breast cancer Mother 56   Colon polyps Mother 28   Breast cancer Paternal Grandmother    Colon cancer Neg Hx    Esophageal cancer Neg Hx    Rectal cancer Neg Hx     Social History:  Social History   Socioeconomic History   Marital status: Single    Spouse name: Not on file   Number of children: 1   Years of education: Not on file   Highest education level: Not on file  Occupational History  Occupation: Disability  Tobacco Use   Smoking status: Every Day    Current packs/day: 0.50    Average packs/day: 0.5 packs/day for 37.0 years (18.5 ttl pk-yrs)    Types: Cigarettes   Smokeless tobacco: Never   Tobacco comments:    10  CIG A DAY  Vaping Use   Vaping status: Never Used  Substance and Sexual Activity   Alcohol use: No    Alcohol/week: 0.0 standard drinks of alcohol   Drug use: No   Sexual activity: Yes    Birth control/protection: Condom    Comment: both  Other Topics Concern   Not on file  Social History Narrative   Not on file   Social Drivers of Health   Financial Resource Strain: Low Risk  (04/19/2024)   Received from [facility]   Overall Financial Resource Strain (CARDIA)    Difficulty of Paying Living Expenses: Not hard at all  Food Insecurity: Food Insecurity Present (04/19/2024)   Received from [facility]   Hunger Vital Sign    Worried About Running Out of Food in the Last Year: Sometimes true    Ran Out of Food in the Last Year: Sometimes true  Transportation Needs: No Transportation Needs (04/19/2024)   Received from [facility] - Transportation    Lack of Transportation (Medical): No    Lack of Transportation (Non-Medical): No   Physical Activity: Insufficiently Active (04/19/2024)   Received from [facility]   Exercise Vital Sign    Days of Exercise per Week: 2 days    Minutes of Exercise per Session: 50 min  Stress: No Stress Concern Present (04/19/2024)   Received from [facility] - Occupational Stress Questionnaire    Feeling of Stress : Only a little  Social Connections: Socially Integrated (04/19/2024)   Received from [facility]   Social Network    How would you rate your social network (family, work, friends)?: Good participation with social networks    Allergies:  Allergies  Allergen Reactions   Brett Camera ] Other (See Comments)    Heart flutters   Banana Itching    Metabolic Disorder Labs: Lab Results  Component Value Date   HGBA1C 5.8 (H) 04/02/2023   No results found for: "PROLACTIN" Lab Results  Component Value Date   CHOL 198 04/02/2023   TRIG 194 (H) 04/02/2023   HDL 36 (L) 04/02/2023   CHOLHDL 5.5 (H) 04/02/2023   VLDL 29.8 04/24/2015   LDLCALC 127 (H) 04/02/2023   LDLCALC 89 04/24/2015   Lab Results  Component Value Date   TSH 1.750 04/02/2023   TSH 1.06 05/29/2021    Therapeutic Level Labs: No results found for: "LITHIUM" No results found for: "VALPROATE" No results found for: "CBMZ"  Current Medications: Current Outpatient Medications  Medication Sig Dispense Refill   albuterol  (PROVENTIL ) (5 MG/ML) 0.5% nebulizer solution Take 0.5 mLs (2.5 mg total) by nebulization every 6 (six) hours as needed for wheezing or shortness of breath. 20 mL 1   albuterol  (VENTOLIN  HFA) 108 (90 Base) MCG/ACT inhaler Inhale 2 puffs into the lungs every 6 (six) hours as needed for wheezing or shortness of breath. 3 each 0   AMBULATORY NON FORMULARY MEDICATION Medication Name: GI Cocktail 90 ml 2% viscous lidocaine 90 ml bentyl  10mg /61ml 270 ml Mylanta SIG 10 ml bid prn gas and stomach pain-do not use more than 3 days in a row 600 mL 2    amLODipine  (  NORVASC ) 5 MG tablet TAKE 1 TABLET EVERY DAY (Patient taking differently: TAKE 1 10 mg TABLET EVERY DAY) 90 tablet 1   cyclobenzaprine  (FEXMID ) 7.5 MG tablet TAKE 1 TABLET(7.5 MG) BY MOUTH THREE TIMES DAILY AS NEEDED FOR MUSCLE SPASMS 30 tablet 3   diclofenac  (VOLTAREN ) 75 MG EC tablet TAKE 1 TABLET(75 MG) BY MOUTH TWICE DAILY 180 tablet 0   dicyclomine  (BENTYL ) 10 MG capsule Take 1 capsule (10 mg total) by mouth in the morning, at noon, in the evening, and at bedtime. Please keep your upcoming appointment with Dr. Venice Gillis on 2-27 for any further refills. 120 capsule 0   gabapentin  (NEURONTIN ) 400 MG capsule Take 1 capsule (400 mg total) by mouth 3 (three) times daily. 270 capsule 0   hydrochlorothiazide  (HYDRODIURIL ) 25 MG tablet TAKE 1 TABLET(25 MG) BY MOUTH DAILY 90 tablet 1   hydrOXYzine  (ATARAX ) 50 MG tablet Take 1 tablet (50 mg total) by mouth 2 (two) times daily as needed. 120 tablet 0   lamoTRIgine  (LAMICTAL ) 150 MG tablet Take 1 tablet (150 mg total) by mouth daily. 60 tablet 0   linaclotide  (LINZESS ) 290 MCG CAPS capsule Take 1 capsule (290 mcg total) by mouth daily before breakfast. 90 capsule 3   linaclotide  (LINZESS ) 290 MCG CAPS capsule Take 1 capsule (290 mcg total) by mouth daily before breakfast. Please call [phone] to schedule an office visit for more refills 90 capsule 0   methylPREDNISolone  (MEDROL  DOSEPAK) 4 MG TBPK tablet 6 day dose pack - take as directed 21 tablet 0   mirtazapine  (REMERON  SOL-TAB) 30 MG disintegrating tablet DISSOLVE 1 TABLET(15 MG) ON THE TONGUE AT BEDTIME 45 tablet 0   ondansetron  (ZOFRAN -ODT) 8 MG disintegrating tablet Take 1 tablet (8 mg total) by mouth every 8 (eight) hours as needed for nausea or vomiting. 30 tablet 0   pantoprazole  (PROTONIX ) 40 MG tablet Take 1 tablet (40 mg total) by mouth 2 (two) times daily. 180 tablet 0   predniSONE  (DELTASONE ) 10 MG tablet Take 5 tablets (50 mg total) by mouth daily. 20 tablet 0   prochlorperazine   (COMPAZINE ) 25 MG suppository Place 1 suppository (25 mg total) rectally every 12 (twelve) hours as needed for nausea or vomiting. 15 suppository 0   prochlorperazine  (COMPAZINE ) 25 MG suppository Use 1 suppository rectally every 6-8 hours as needed nausea/vomiting 12 suppository 3   propranolol  (INDERAL ) 20 MG tablet Take 1 tablet (20 mg total) by mouth at bedtime. 90 tablet 0   QUEtiapine  (SEROQUEL  XR) 400 MG 24 hr tablet Take 1 tablet (400 mg total) by mouth at bedtime. 30 tablet 0   sucralfate (CARAFATE) 1 g tablet Take 1 g by mouth with breakfast, with lunch, and with evening meal.     traZODone  (DESYREL ) 100 MG tablet Take 1 tablet (100 mg total) by mouth at bedtime as needed for sleep. 60 tablet 0   triamcinolone  cream (KENALOG ) 0.1 % Apply 1 Application topically 2 (two) times daily. 30 g 0   No current facility-administered medications for this visit.      Psychiatric Specialty Exam: Review of Systems  Gastrointestinal:  Negative for abdominal pain.  Neurological:  Negative for headaches.  Psychiatric/Behavioral:  Positive for sleep disturbance. Negative for decreased concentration, dysphoric mood, hallucinations and suicidal ideas. The patient is nervous/anxious.     There were no vitals taken for this visit.There is no height or weight on file to calculate BMI.  General Appearance: Casual  Eye Contact:  Good  Speech:  Clear  and Coherent  Volume:  Normal  Mood:  Euthymic  Affect:  Congruent  Thought Process:  Coherent  Orientation:  Full (Time, Place, and Person)  Thought Content: Logical   Suicidal Thoughts:  No  Homicidal Thoughts:  No  Memory:  Immediate;   Good Recent;   Good  Judgement:  Good  Insight:  Good  Psychomotor Activity:  NA  Concentration:  Concentration: Good  Recall:  NA  Fund of Knowledge: Good  Language: Good  Akathisia:  No  Handed:    AIMS (if indicated): not done  Assets:  Communication Skills Desire for Improvement Housing Leisure  Time Resilience Social Support Transportation  ADL's:  Intact  Cognition: WNL  Sleep:  Fair   Screenings: GAD-7    Flowsheet Row Office Visit from 07/25/2022 in [facility] Video Visit from 01/16/2022 in [facility] Video Visit from 10/22/2021 in [facility] Video Visit from 07/16/2021 in [facility]  Total GAD-7 Score 4 2 4 18       PHQ2-9    Flowsheet Row Office Visit from 07/25/2022 in [facility] Office Visit from 07/18/2022 in [facility] - A Dept Of [place] Office Visit from 03/27/2022 in [facility] - A Dept Of [place]. [facility] Video Visit from 01/16/2022 in [facility] Video Visit from 10/22/2021 in [facility]  PHQ-2 Total Score 0 0 0 0 1  PHQ-9 Total Score 6 -- -- 1 5      Flowsheet Row ED from 05/06/2024 in [facility] Emergency Department at [facility] ED from 04/18/2024 in [facility] Emergency Department at [facility] ED from 09/22/2023 in [facility] Emergency Department at [facility]  C-SSRS RISK CATEGORY No Risk No Risk No Risk        Assessment and Plan:  Patient endorses improvement in mood but has continued anxiety. Patient reports that she is trying to use previously learned coping skills and continues to find benefit but is open to restarting therapy due to continued anxiety.   EKG 08/2023- Qtc 457 HR 82   Mood disorder otherwise specified vs Bipolar d/o Concern for hoarding disorder Hx GAD [place] burden Polypharmacy - Continue Lamictal  to 150 mg daily  -  Continue Remeron  30mg  QHS - Continue gabapentin  400 mg 3 times daily -  Continue Seroquel  400mg  XR -  Continue Trazodone   100mg  at bedtime (taking nightly) - Continue Hydroxyzine  50mg  BID PRN  - Continue  Propanolol 20mg  at bedtime  rx chantix by PCP (patient smoking again)  Follow-up in approximately 6 weeks   Collaboration of Care: Collaboration of Care: Attengind provider Dr. Deborah Falling present for part of assessment  Patient/Guardian was advised Release of Information must be obtained prior to any record release in order to collaborate their care with an outside provider. Patient/Guardian was advised if they have not already done so to contact the registration department to sign all necessary forms in order for us  to release information regarding their care.   Consent: Patient/Guardian gives verbal consent for treatment and assignment of benefits for services provided during this visit. Patient/Guardian expressed understanding and agreed to proceed.   PGY-4 Tamera Falco, MD 05/28/2024, 8:34 AM

## 2024-06-10 ENCOUNTER — Ambulatory Visit: Admitting: Gastroenterology

## 2024-06-10 ENCOUNTER — Encounter: Payer: Self-pay | Admitting: Gastroenterology

## 2024-06-10 VITALS — BP 110/70 | HR 76 | Ht 65.25 in | Wt 212.4 lb

## 2024-06-10 DIAGNOSIS — K581 Irritable bowel syndrome with constipation: Secondary | ICD-10-CM

## 2024-06-10 DIAGNOSIS — K449 Diaphragmatic hernia without obstruction or gangrene: Secondary | ICD-10-CM | POA: Diagnosis not present

## 2024-06-10 DIAGNOSIS — K648 Other hemorrhoids: Secondary | ICD-10-CM

## 2024-06-10 DIAGNOSIS — K219 Gastro-esophageal reflux disease without esophagitis: Secondary | ICD-10-CM | POA: Diagnosis not present

## 2024-06-10 MED ORDER — AMBULATORY NON FORMULARY MEDICATION: 2 refills | Status: AC

## 2024-06-10 NOTE — Progress Notes (Signed)
 Chief Complaint: follow-up IBS-C, GERD Primary GI Doctor:Dr. Venice Gillis  HPI: Jasmine Jackson is a 53 year old female with a history of polyps and GERD with hiatal hernia who presents with gastrointestinal symptoms and constipation. Patient last seen by Dr. Venice Gillis in GI office on 02/19/2024 for GERD with small hiatal hernia with occasional nausea and vomiting and IBS constipation.    Interval History     Patient presents to discuss hemorrhoid banding for internal hemorrhoids. She states she has had banding in the past with Dr. Sandrea Cruel that worked well. Patient states the hemorrhoids protrude and she has a lot of rectal irritation and itching. She has continued with issues with her constipation. Patient has history of IBS-C and taking Linzess  290 mcg po daily and Miralax  every other day. She also does consumes prunes. She has bowel movement every 2-3 days. She admits to straining. He has not tried the Miralax  daily as recommended by Dr. Venice Gillis at last appointment.     Patient has history of GERD and taking Protonix  40 mg p.o. twice daily for now.  GI cocktail prn- patient states she never received prescription. Patient weaning off Carafate as instructed.  Past GI procedures: EGD 06/22/2021 - Small hiatal hernia. - Moderate gastritis.   Colon 07/2020: hyperplastic polyps. Mild diverticulosis. Internal hemorrhoids. Rpt 10 yrs   CT AP 05/2021: Neg   Wt Readings from Last 3 Encounters:  06/10/24 212 lb 6 oz (96.3 kg)  05/06/24 223 lb 15.8 oz (101.6 kg)  02/19/24 224 lb (101.6 kg)      Past Medical History:  Diagnosis Date   Anemia    06/19/21-pt has no recollection of this dx   Anxiety    Anxiety and depression    Asthma    Bipolar 1 disorder (HCC)    Depression    Excessive daytime sleepiness 11/18/2017   GERD (gastroesophageal reflux disease)    Heart murmur    Hemorrhoids    Hiatal hernia    Hypertension    Internal hemorrhoids    Irritable bowel disease 2014   Toe injury 11/18/2017     Past Surgical History:  Procedure Laterality Date   COLONOSCOPY  2021   HEMORRHOID SURGERY     INGUINAL HERNIA REPAIR     PARTIAL HYSTERECTOMY     fibroids   TONSILLECTOMY     UPPER GASTROINTESTINAL ENDOSCOPY      Current Outpatient Medications  Medication Sig Dispense Refill   albuterol  (PROVENTIL ) (5 MG/ML) 0.5% nebulizer solution Take 0.5 mLs (2.5 mg total) by nebulization every 6 (six) hours as needed for wheezing or shortness of breath. 20 mL 1   albuterol  (VENTOLIN  HFA) 108 (90 Base) MCG/ACT inhaler Inhale 2 puffs into the lungs every 6 (six) hours as needed for wheezing or shortness of breath. 3 each 0   amLODipine  (NORVASC ) 5 MG tablet TAKE 1 TABLET EVERY DAY (Patient taking differently: TAKE 1 10 mg TABLET EVERY DAY) 90 tablet 1   cyclobenzaprine  (FEXMID ) 7.5 MG tablet TAKE 1 TABLET(7.5 MG) BY MOUTH THREE TIMES DAILY AS NEEDED FOR MUSCLE SPASMS 30 tablet 3   diclofenac  (VOLTAREN ) 75 MG EC tablet TAKE 1 TABLET(75 MG) BY MOUTH TWICE DAILY 180 tablet 0   dicyclomine  (BENTYL ) 10 MG capsule Take 1 capsule (10 mg total) by mouth in the morning, at noon, in the evening, and at bedtime. Please keep your upcoming appointment with Dr. Venice Gillis on 2-27 for any further refills. 120 capsule 0   gabapentin  (NEURONTIN ) 400  MG capsule Take 1 capsule (400 mg total) by mouth 3 (three) times daily. 270 capsule 0   hydrochlorothiazide  (HYDRODIURIL ) 25 MG tablet TAKE 1 TABLET(25 MG) BY MOUTH DAILY 90 tablet 1   hydrOXYzine  (ATARAX ) 50 MG tablet Take 1 tablet (50 mg total) by mouth 2 (two) times daily as needed. 120 tablet 0   lamoTRIgine  (LAMICTAL ) 150 MG tablet Take 1 tablet (150 mg total) by mouth daily. 60 tablet 0   linaclotide  (LINZESS ) 290 MCG CAPS capsule Take 1 capsule (290 mcg total) by mouth daily before breakfast. Please call [phone] to schedule an office visit for more refills 90 capsule 0   losartan (COZAAR) 25 MG tablet Take 25 mg by mouth daily.     mirtazapine  (REMERON  SOL-TAB)  30 MG disintegrating tablet DISSOLVE 1 TABLET(15 MG) ON THE TONGUE AT BEDTIME 45 tablet 0   ondansetron  (ZOFRAN -ODT) 8 MG disintegrating tablet Take 1 tablet (8 mg total) by mouth every 8 (eight) hours as needed for nausea or vomiting. 30 tablet 0   pantoprazole  (PROTONIX ) 40 MG tablet Take 1 tablet (40 mg total) by mouth 2 (two) times daily. 180 tablet 0   prochlorperazine  (COMPAZINE ) 25 MG suppository Use 1 suppository rectally every 6-8 hours as needed nausea/vomiting 12 suppository 3   propranolol  (INDERAL ) 20 MG tablet Take 1 tablet (20 mg total) by mouth at bedtime. 90 tablet 0   QUEtiapine  (SEROQUEL  XR) 400 MG 24 hr tablet Take 1 tablet (400 mg total) by mouth at bedtime. 30 tablet 0   sucralfate (CARAFATE) 1 g tablet Take 1 g by mouth with breakfast, with lunch, and with evening meal.     traZODone  (DESYREL ) 100 MG tablet Take 1 tablet (100 mg total) by mouth at bedtime as needed for sleep. 60 tablet 0   AMBULATORY NON FORMULARY MEDICATION Medication Name: GI Cocktail 45 ml 2% viscous lidocaine 45 ml bentyl  10mg /52ml 135 ml Mylanta SIG 5 ml bid prn gas and stomach pain-do not use more than 3 days in a row 300 mL 2   varenicline (CHANTIX) 1 MG tablet Take 1 mg by mouth daily.     No current facility-administered medications for this visit.    Allergies as of 06/10/2024 - Review Complete 06/10/2024  Allergen Reaction Noted   [place] ] Other (See Comments) 11/27/2016   Banana Itching 12/13/2017    Family History  Problem Relation Age of Onset   Breast cancer Maternal Aunt        x4   Stomach cancer Maternal Aunt    Prostate cancer Maternal Uncle        x2   Breast cancer Maternal Grandmother    Breast cancer Mother 14   Colon polyps Mother 75   Breast cancer Paternal Grandmother    Colon cancer Neg Hx    Esophageal cancer Neg Hx    Rectal cancer Neg Hx     Review of Systems:    Constitutional: No weight loss, fever, chills, weakness or fatigue HEENT: Eyes: No change in  vision               Ears, Nose, Throat:  No change in hearing or congestion Skin: No rash or itching Cardiovascular: No chest pain, chest pressure or palpitations   Respiratory: No SOB or cough Gastrointestinal: See HPI and otherwise negative Genitourinary: No dysuria or change in urinary frequency Neurological: No headache, dizziness or syncope Musculoskeletal: No new muscle or joint pain Hematologic: No bleeding or bruising Psychiatric: No history of  depression or anxiety    Physical Exam:  Vital signs: BP 110/70 (BP Location: Left Arm, Patient Position: Sitting, Cuff Size: Large)   Pulse 76   Ht 5' 5.25 (1.657 m) Comment: height measured without shoes  Wt 212 lb 6 oz (96.3 kg)   BMI 35.07 kg/m   Constitutional:   Pleasant  female appears to be in NAD, Well developed, Well nourished, alert and cooperative Throat: Oral cavity and pharynx without inflammation, swelling or lesion.  Respiratory: Respirations even and unlabored. Lungs clear to auscultation bilaterally.   No wheezes, crackles, or rhonchi.  Cardiovascular: Normal S1, S2. Regular rate and rhythm. No peripheral edema, cyanosis or pallor.  Gastrointestinal:  Soft, nondistended, nontender. No rebound or guarding. Normal bowel sounds. No appreciable masses or hepatomegaly. Rectal:  Not performed.  Msk:  Symmetrical without gross deformities. Without edema, no deformity or joint abnormality.  Neurologic:  Alert and  oriented x4;  grossly normal neurologically.  Skin:   Dry and intact without significant lesions or rashes. Psychiatric: Oriented to person, place and time. Demonstrates good judgement and reason without abnormal affect or behaviors.  RELEVANT LABS AND IMAGING: CBC    Latest Ref Rng & Units 09/22/2023    4:15 PM 09/07/2023   12:44 PM 04/02/2023   12:00 AM  CBC  WBC 4.0 - 10.5 K/uL 6.1  6.1  6.2   Hemoglobin 12.0 - 15.0 g/dL 16.1  09.6  04.5   Hematocrit 36.0 - 46.0 % 42.7  39.9  41.2   Platelets 150 - 400  K/uL 326  354  370      CMP     Latest Ref Rng & Units 09/22/2023    4:15 PM 09/07/2023   12:44 PM 04/02/2023   12:00 AM  CMP  Glucose 70 - 99 mg/dL 90  87  82   BUN 6 - 20 mg/dL 17  11  10    Creatinine 0.44 - 1.00 mg/dL 4.09  8.11  9.14   Sodium 135 - 145 mmol/L 138  141  142   Potassium 3.5 - 5.1 mmol/L 4.5  3.8  5.0   Chloride 98 - 111 mmol/L 103  105  103   CO2 22 - 32 mmol/L 27  28  24    Calcium 8.9 - 10.3 mg/dL 78.2  9.3  95.6   Total Protein 6.0 - 8.5 g/dL   7.2   Total Bilirubin 0.0 - 1.2 mg/dL   0.4   Alkaline Phos 44 - 121 IU/L   92   AST 0 - 40 IU/L   20   ALT 0 - 32 IU/L   19      Lab Results  Component Value Date   TSH 1.750 04/02/2023     Assessment: Encounter Diagnoses  Name Primary?   Internal hemorrhoids Yes   Irritable bowel syndrome with constipation    Hiatal hernia with GERD   #1. GERD with small HH with occ N/V.    #2. IBS-C. Neg colon 07/2020  #3 Internal hemorrhoids   Plan: -Continue Linzess  290mcg po every day -Continue Miralax  17g po every day, if not effective increase to BID -GI cocktail as needed, refilled smaller script and changed to [facility] per patient request -Continue Protonix  40 mg p.o. twice daily for now.  Can wean off Carafate. - schedule for Hemorrhoid banding with Dr. Karene Oto   Thank you for the courtesy of this consult. Please call me with any questions or concerns.   Jovan Schickling  Facundo Allemand, FNP-C [facility] 06/10/2024, 3:42 PM  Cc: No ref. provider found

## 2024-06-16 ENCOUNTER — Other Ambulatory Visit (HOSPITAL_COMMUNITY): Payer: Self-pay | Admitting: Physician Assistant

## 2024-06-16 ENCOUNTER — Other Ambulatory Visit (HOSPITAL_COMMUNITY): Payer: Self-pay | Admitting: Student

## 2024-06-16 ENCOUNTER — Telehealth (HOSPITAL_COMMUNITY): Payer: Self-pay

## 2024-06-16 DIAGNOSIS — F411 Generalized anxiety disorder: Secondary | ICD-10-CM

## 2024-06-16 MED ORDER — LAMOTRIGINE 150 MG PO TABS: 150.0000 mg | ORAL_TABLET | Freq: Every day | ORAL | 0 refills | Status: DC

## 2024-06-16 NOTE — Telephone Encounter (Signed)
 Medication refill- fax recieved from [facility] on [address] for refill of Lamotrigine  150 mg. last prescribed 03/12/24 for 60, one a day. Patient doesn't return until 06/22/24. Request refill to bridge.

## 2024-06-16 NOTE — Telephone Encounter (Signed)
 Message acknowledged and reviewed.

## 2024-06-16 NOTE — Progress Notes (Signed)
 Called patient.  She reports adherence to lamotrigine  since last appointment.  Likely using previous supply of medications since she should have run out of medication from the last fill.  Refill sent in.  Updated on upcoming appointment with new resident provider on 7/1.

## 2024-06-17 ENCOUNTER — Ambulatory Visit: Admitting: Gastroenterology

## 2024-06-17 ENCOUNTER — Encounter: Payer: Self-pay | Admitting: Gastroenterology

## 2024-06-17 VITALS — BP 118/80 | Ht 65.5 in | Wt 212.0 lb

## 2024-06-17 DIAGNOSIS — K642 Third degree hemorrhoids: Secondary | ICD-10-CM

## 2024-06-17 DIAGNOSIS — K581 Irritable bowel syndrome with constipation: Secondary | ICD-10-CM

## 2024-06-17 DIAGNOSIS — K648 Other hemorrhoids: Secondary | ICD-10-CM

## 2024-06-17 NOTE — Patient Instructions (Signed)
 _______________________________________________________  If your blood pressure at your visit was 140/90 or greater, please contact your primary care physician to follow up on this.  _______________________________________________________  If you are age 54 or older, your body mass index should be between 23-30. Your Body mass index is 34.74 kg/m. If this is out of the aforementioned range listed, please consider follow up with your Primary Care Provider.  If you are age 6 or younger, your body mass index should be between 19-25. Your Body mass index is 34.74 kg/m. If this is out of the aformentioned range listed, please consider follow up with your Primary Care Provider.   ________________________________________________________  The [place] providers would like to encourage you to use MYCHART to communicate with providers for non-urgent requests or questions.  Due to long hold times on the telephone, sending your provider a message by [facility] may be a faster and more efficient way to get a response.  Please allow 48 business hours for a response.  Please remember that this is for non-urgent requests.  _______________________________________________________  GLENICE CAI PROCEDURE    FOLLOW-UP CARE   The procedure you have had should have been relatively painless since the banding of the area involved does not have nerve endings and there is no pain sensation.  The rubber band cuts off the blood supply to the hemorrhoid and the band may fall off as soon as 48 hours after the banding (the band may occasionally be seen in the toilet bowl following a bowel movement). You may notice a temporary feeling of fullness in the rectum which should respond adequately to plain Tylenol  or Motrin .  Following the banding, avoid strenuous exercise that evening and resume full activity the next day.  A sitz bath (soaking in a warm tub) or bidet is soothing, and can be useful for cleansing the area  after bowel movements.     To avoid constipation, take two tablespoons of natural wheat bran, natural oat bran, flax, Benefiber or any over the counter fiber supplement and increase your water intake to 7-8 glasses daily.    Unless you have been prescribed anorectal medication, do not put anything inside your rectum for two weeks: No suppositories, enemas, fingers, etc.  Occasionally, you may have more bleeding than usual after the banding procedure.  This is often from the untreated hemorrhoids rather than the treated one.  Don't be concerned if there is a tablespoon or so of blood.  If there is more blood than this, lie flat with your bottom higher than your head and apply an ice pack to the area. If the bleeding does not stop within a half an hour or if you feel faint, call our office at [phone] or go to the emergency room.  Problems are not common; however, if there is a substantial amount of bleeding, severe pain, chills, fever or difficulty passing urine (very rare) or other problems, you should call us  at [phone] or report to the nearest emergency room.  Do not stay seated continuously for more than 2-3 hours for a day or two after the procedure.  Tighten your buttock muscles 10-15 times every two hours and take 10-15 deep breaths every 1-2 hours.  Do not spend more than a few minutes on the toilet if you cannot empty your bowel; instead re-visit the toilet at a later time.   It was a pleasure to see you today!  Vito Cirigliano, D.O.

## 2024-06-17 NOTE — Progress Notes (Signed)
 Agree with the assessment and plan as outlined by [facility], FNP-C.  Carlitos Bottino, DO, [facility]

## 2024-06-17 NOTE — Progress Notes (Signed)
 Chief Complaint:    Symptomatic Internal Hemorrhoids; Hemorrhoid Band Ligation   HPI:     Patient is a 53 y.o. female with a history of symptomatic internal hemorrhoids referred to me by Cathryne Beal, NP and Dr. Charlanne for evaluation and treatment of hemorrhoids.  She was last seen by Deanna May on 06/10/2024 for symptomatic grade 2-3 hemorrhoids, unresponsive to maximal medical therapy, and referred to me for rubber band ligation of symptomatic hemorrhoidal disease.  Has had hemorrhoid banding by Dr. Aneita in the past with good response.  More recently has had recurrence of rectal irritation and itching.  Will manually push hemorrhoids back. Intermittent BRBPR again. Symptoms worse with her underlying IBS-C, treated with Linzess  290 mcg daily and MiraLAX  every other day, along with prunes and hydration.  Last colonoscopy was 07/2020 and notable for ascending colon AVM, hyperplastic polyps, mild diverticulosis, large grade 3 internal hemorrhoids.  Subsequently underwent hemorrhoid banding in the fall 2022 with good response.  Did have recurrence of minor bleeding after completion of banding, treated with preparation suppositories as needed.   No change in medical or surgical history, medications, allergies, social history since last appointment with me.   Review of systems:     No chest pain, no SOB, no fevers, no urinary sx   Past Medical History:  Diagnosis Date   Anemia    06/19/21-pt has no recollection of this dx   Anxiety    Anxiety and depression    Asthma    Bipolar 1 disorder (HCC)    Depression    Excessive daytime sleepiness 11/18/2017   GERD (gastroesophageal reflux disease)    Heart murmur    Hemorrhoids    Hiatal hernia    Hypertension    Internal hemorrhoids    Irritable bowel disease 2014   Toe injury 11/18/2017    Patient's surgical history, family medical history, social history, medications and allergies were all reviewed in Epic    Current Outpatient  Medications  Medication Sig Dispense Refill   albuterol  (PROVENTIL ) (5 MG/ML) 0.5% nebulizer solution Take 0.5 mLs (2.5 mg total) by nebulization every 6 (six) hours as needed for wheezing or shortness of breath. 20 mL 1   albuterol  (VENTOLIN  HFA) 108 (90 Base) MCG/ACT inhaler Inhale 2 puffs into the lungs every 6 (six) hours as needed for wheezing or shortness of breath. 3 each 0   AMBULATORY NON FORMULARY MEDICATION Medication Name: GI Cocktail 45 ml 2% viscous lidocaine 45 ml bentyl  10mg /106ml 135 ml Mylanta SIG 5 ml bid prn gas and stomach pain-do not use more than 3 days in a row 300 mL 2   cyclobenzaprine  (FEXMID ) 7.5 MG tablet TAKE 1 TABLET(7.5 MG) BY MOUTH THREE TIMES DAILY AS NEEDED FOR MUSCLE SPASMS 30 tablet 3   diclofenac  (VOLTAREN ) 75 MG EC tablet TAKE 1 TABLET(75 MG) BY MOUTH TWICE DAILY 180 tablet 0   dicyclomine  (BENTYL ) 10 MG capsule Take 1 capsule (10 mg total) by mouth in the morning, at noon, in the evening, and at bedtime. Please keep your upcoming appointment with Dr. Charlanne on 2-27 for any further refills. 120 capsule 0   gabapentin  (NEURONTIN ) 400 MG capsule Take 1 capsule (400 mg total) by mouth 3 (three) times daily. 270 capsule 0   hydrochlorothiazide  (HYDRODIURIL ) 25 MG tablet TAKE 1 TABLET(25 MG) BY MOUTH DAILY 90 tablet 1   hydrOXYzine  (ATARAX ) 50 MG tablet Take 1 tablet (50 mg total) by mouth 2 (two) times daily as needed. 120 tablet  0   lamoTRIgine  (LAMICTAL ) 150 MG tablet Take 1 tablet (150 mg total) by mouth daily. 60 tablet 0   linaclotide  (LINZESS ) 290 MCG CAPS capsule Take 1 capsule (290 mcg total) by mouth daily before breakfast. Please call [phone] to schedule an office visit for more refills 90 capsule 0   losartan (COZAAR) 25 MG tablet Take 25 mg by mouth daily.     mirtazapine  (REMERON  SOL-TAB) 30 MG disintegrating tablet DISSOLVE 1 TABLET(15 MG) ON THE TONGUE AT BEDTIME 45 tablet 0   ondansetron  (ZOFRAN -ODT) 8 MG disintegrating tablet Take 1 tablet (8 mg  total) by mouth every 8 (eight) hours as needed for nausea or vomiting. 30 tablet 0   pantoprazole  (PROTONIX ) 40 MG tablet Take 1 tablet (40 mg total) by mouth 2 (two) times daily. 180 tablet 0   prochlorperazine  (COMPAZINE ) 25 MG suppository Use 1 suppository rectally every 6-8 hours as needed nausea/vomiting 12 suppository 3   propranolol  (INDERAL ) 20 MG tablet Take 1 tablet (20 mg total) by mouth at bedtime. 90 tablet 0   QUEtiapine  (SEROQUEL  XR) 400 MG 24 hr tablet Take 1 tablet (400 mg total) by mouth at bedtime. 30 tablet 0   traZODone  (DESYREL ) 100 MG tablet Take 1 tablet (100 mg total) by mouth at bedtime as needed for sleep. 60 tablet 0   varenicline (CHANTIX) 1 MG tablet Take 1 mg by mouth daily.     amLODipine  (NORVASC ) 5 MG tablet TAKE 1 TABLET EVERY DAY (Patient taking differently: TAKE 1 10 mg TABLET EVERY DAY) 90 tablet 1   sucralfate (CARAFATE) 1 g tablet Take 1 g by mouth with breakfast, with lunch, and with evening meal.     No current facility-administered medications for this visit.    Physical Exam:     BP 118/80   Ht 5' 5.5 (1.664 m)   Wt 212 lb (96.2 kg)   BMI 34.74 kg/m   GENERAL:  Pleasant female in NAD PSYCH: Cooperative, normal affect NEURO: Alert and oriented x 3, no focal neurologic deficits Rectal exam: Sensation intact and preserved anal wink.  Grade 3 hemorrhoid in LL position and grade 2 hemorrhoids in RA and RP positions on exam.  No external anal fissures noted. Normal sphincter tone. No palpable mass. No blood on the exam glove. (Chaperone: Sonny, CMA).   IMPRESSION and PLAN:    #1.  Symptomatic internal hemorrhoids: PROCEDURE NOTE: The patient presents with symptomatic grade 2-3 hemorrhoids, unresponsive to maximal medical therapy, requesting rubber band ligation of symptomatic hemorrhoidal disease.  All risks, benefits and alternative forms of therapy were described and informed consent was obtained.  In the Left Lateral Decubitus position,  anoscopic examination revealed grade 3 hemorrhoids in the LL position and grade 2 hemorrhoids in the RA and RP position(s).  The anorectum was pre-medicated with [facility]. The decision was made to band the LL internal hemorrhoid, and the [facility] was used to perform band ligation without complication.  Digital anorectal examination was then performed to assure proper positioning of the band, and to adjust the banded tissue as required.  The patient was discharged home without pain or other issues.  Dietary and behavioral recommendations were given and along with follow-up instructions.     The following adjunctive treatments were recommended:  -Resume high-fiber diet with fiber supplement (i.e. Citrucel or Benefiber) with goal for soft stools without straining to have a BM. -Resume adequate fluid intake.  The patient will return in 4+ weeks for follow-up and  possible additional banding as required. No complications were encountered and the patient tolerated the procedure well.  If continued hemorrhoidal symptoms, plan to refer to Colorectal Surgery.    #2.  IBS-C - Continue Linzess  - Continue MiraLAX  - Continue prunes, hydration      Sandor LULLA Flatter ,DO, FACG 06/17/2024, 3:56 PM

## 2024-06-18 ENCOUNTER — Telehealth (HOSPITAL_COMMUNITY): Payer: Self-pay

## 2024-06-18 DIAGNOSIS — F39 Unspecified mood [affective] disorder: Secondary | ICD-10-CM

## 2024-06-18 DIAGNOSIS — F411 Generalized anxiety disorder: Secondary | ICD-10-CM

## 2024-06-18 MED ORDER — QUETIAPINE FUMARATE ER 400 MG PO TB24: 400.0000 mg | ORAL_TABLET | Freq: Every day | ORAL | 0 refills | Status: DC

## 2024-06-18 NOTE — Telephone Encounter (Signed)
 Pt called in requesting a medication refill of Quetiapine  400 mg. Pt reports having one pill left. Pt has a appointment schedule for 06/22/24.  Thanks

## 2024-06-18 NOTE — Telephone Encounter (Signed)
 15 day fill sent to cover until 7/1 appointment

## 2024-06-21 NOTE — Progress Notes (Unsigned)
 BH MD Outpatient Progress Note  06/22/2024 5:02 PM Jasmine Jackson  MRN:  [id number]  Assessment:  Jasmine Jackson presents for follow-up evaluation in-person on 06/22/24. Patient with a history of bipolar I disorder presents with ongoing insomnia, chronic anxiety, and reported mood cycling. She describes 3-day episodes characterized by expansive mood, insomnia, sexual and financial indiscretions, grandiosity, pressured speech, and racing thoughts-suggestive of hypomanic or manic episodes. These symptoms appear refractory to current regimen of Seroquel  and Lamictal , raising concern for suboptimal mood stabilization.   Although Depakote was briefly considered as a potential mood stabilizer given that the patient is postmenopausal, the decision was made to first optimize her current regimen before introducing additional agents.  Outside of these episodes, the patient continues to experience difficulty sleeping despite multiple sedating agents, including Atarax , Remeron , propranolol , and Seroquel . Given her BMI of ~34 and reports of snoring, OSA is a strong contributor to the differential and follow-up with sleep study is recommended.  Psychosocially, the patient is coping with significant grief following the loss of four loved ones in the past year. She denies suicidal ideation and reports ongoing benefit from past psychotherapy, which she is open to re-engaging. At this time, she remains psychiatrically stable without evidence of acute risk.  Identifying Information: Jasmine Jackson is a 53 y.o. female with a history of unspecified mood disorder, PTSD, anxiety, panic attacks, and depression. who is an established patient with [facility]. The patient was previously being followed by Dr. Juleen for medication management, with the patient being a no show on appointment for 05/28/2024, last being seen in March virtually.  PMHx significant for HTN, GERD For a comprehensive history  and detailed assessment, please refer to the initial adult assessment.  Plan:  # Bipolar I Disorder Status of problem: New to me Interventions: -- Increase Seroquel  400 mg  to 600 mg nightly for improved mood stabilization, 30 days, 0 refills -- Continue Lamotrigine  150 mg daily,  30 days, 0 refills  # Insomnia # GAD Status of problem: New to me Interventions: -- Continue Trazodone  100 mg nightly PRN,  30 days, 0 refills -- Continue Remeron  30 mg,  30 days, 0 refills -- Decrease Gabapentin  400 mg to 300 mg TID to reduce unfavorable effects of reported poor memory -- Continue Atarax  50 mg bid prn,  30 days, 0 refills  Patient was given contact information for behavioral health clinic and was instructed to call 911 for emergencies.   Subjective:  Chief Complaint:  Chief Complaint  Patient presents with   Medication Problem   Manic Behavior   Stress    Interval History:  Patient presents with ongoing symptoms of anxiety and mood instability, along with significant psychosocial stressors. Since her last visit, she reports continued difficulty with generalized anxiety, panic attacks, and mood elevation episodes consistent with a prior diagnosis of bipolar disorder. She is [place] and describes her faith as a central source of meaning and support. Her 67 year old son is also an important support person.  She reports that since July of last year, she has experienced the deaths of four close family members-her grandmother, two aunts, and a cousin-which she continues to grieve. Additional stressors include caregiving responsibilities for her mother, who has vascular dementia, the recent end of a romantic relationship (2 months ago), and the challenges of raising her autistic son.  She reports concern that gabapentin  may be negatively affecting her memory, though she denies experiencing sedation from the medication.  Protective factors include  her son and her religious  faith. Substance use: Patient resumed smoking 0.5 packs per day following her grandmother's death. She is motivated to quit. Caffeine use: Denies caffeine use due to worsening anxiety. Firearms: Denies access to firearms.  Sleep: Reports difficulty initiating sleep and rarely sleeps more than 4 hours per night. States that prior trials of sleep medications were ineffective, though she previously responded to 1800 mg of Seroquel . She now avoids using the TV in the evenings and attempts to go to bed by 11 PM. Appetite: Describes a chronically low appetite, consuming only one meal per day for the past two years and rarely snacks. Snoring: Reports snoring at night. Last sleep study was 3 years ago and reportedly normal.  Anxiety symptoms: GAD-7 score of 15 (severe). Reports ongoing generalized anxiety and twice-weekly panic attacks, though she notes improvement. She is using coping strategies-particularly breathing exercises-to abort attacks. She continues to experience social withdrawal and isolation.  Mood symptoms / Bipolar disorder: Reports 4-5 episodes of mood elevation since her last visit in March. Most recent episode occurred several days ago. These episodes last approximately 3 days and are characterized by expansive mood, increased energy, impulsivity, pressured speech, racing thoughts, and hypersexual behavior. She denies recent high-risk spending or dangerous behaviors. Suicidal ideation: Denies any recent suicidal thoughts. Last experienced suicidal ideation at age 36.  Reproductive history: Partial hysterectomy in 2003. Currently uses barrier contraception.   Visit Diagnosis:    ICD-10-CM   1. At risk for long QT syndrome  Z91.89 EKG 12-Lead    2. Insomnia, unspecified type  G47.00 traZODone  (DESYREL ) 100 MG tablet    QUEtiapine  (SEROQUEL ) 400 MG tablet    3. GAD (generalized anxiety disorder)  F41.1 traZODone  (DESYREL ) 100 MG tablet    propranolol  (INDERAL ) 20 MG tablet     hydrOXYzine  (ATARAX ) 50 MG tablet    gabapentin  (NEURONTIN ) 300 MG capsule    4. Caregiver burden  Z63.6 propranolol  (INDERAL ) 20 MG tablet    hydrOXYzine  (ATARAX ) 50 MG tablet    5. Unspecified mood (affective) disorder (HCC)  F39 gabapentin  (NEURONTIN ) 300 MG capsule    QUEtiapine  (SEROQUEL ) 400 MG tablet      Past Psychiatric History:  Last visit on 02/2024 - patient's Seroquel  was being decreased   Visit on 09/2022-concern for extremely high dose of Seroquel  and adverse side effects due to this, decrease Seroquel  XR to 300 mg then 200 mg. Patient called later endorsing problems with sleep, started patient on trazodone  25 to 50 mg nightly as needed   02/2023-continue decrease of Seroquel .  Seroquel  decreased from 200 mg to 100 mg and continued Seroquel  XR 400 mg.  Patient endorses on arrival that she had been taking Seroquel  XR 400 and had decreased the regular Seroquel  to the previously mentioned 200.  Also concern that patient does not appear to recall a true manic episode however patient was very nervous about titrating down his Seroquel .  Discussion had with patient about concern for polypharmacy and adverse metabolic side effects especially from being on Seroquel . 03/2023- Mood stable, continues to appear in remission.  Labs also indicated that patient was prediabetic and had hyperlipidemia, patient was okay with continuing down on Seroquel  after discussion that this medication could increase or worsen risk for these diseases. 07/2023- Patient rx of Seroquel  were old and patient was again taking higher doses and not the medication changes. Patient had to be decreased back to Seroquel  XR 600mg  at bedtime and Serqoeul 50mg  at bedtime. Continued Lamictal   and remeron  as they were. Patient had some stressors about her mother's health, but otherwise had a fair mood and no other anxiety outside of her mother's health.  08/2023- Patient endorsing more panic attacks, but not having to call EMS as in the  past, started on Propanolol 10mg  daily PRN and 10mg  at bedtime as her anxiety is keeping her up at night. Patient trying to use coping skills as well. No other medication adjustments made. 10/2023-notes improvement in her anxiety, but continues to have anxiety disrupting her sleep and having poor concentration. Decrease Seroquel  from 600 to 400mg  and change Propanolol 10 BID PRN to 20mg  at bedtime. All other meds the same. 12/2023- Anxiety had some improvements, less panic attacks, but having some anxiety associated with insomnia. Discontinued 50mg  Seroquel .   Past Medical History: Chronic Health conditions: HTN, GERD, IBS, hiatal hernia Medications:  Fexmid  7.5 mg  Voltaren  gel Bentyl  10 mg  Hydrochlorothiazide  Linzess  Zofran  Compazine  suppository Carafate Chantix 1 mg daily  ALL: Aspirin  and bananas Seizures: Denies TBI: Denies  Social History: Lives with in [place] with her sister and 57 yo son The patient is unemployed, revieced SSI Marital status: single Highest level of Education: completed HS, 1 year of college  Social History   Socioeconomic History   Marital status: Single    Spouse name: Not on file   Number of children: 1   Years of education: Not on file   Highest education level: Not on file  Occupational History   Occupation: Disability  Tobacco Use   Smoking status: Every Day    Current packs/day: 0.50    Average packs/day: 0.5 packs/day for 37.0 years (18.5 ttl pk-yrs)    Types: Cigarettes   Smokeless tobacco: Never   Tobacco comments:    10  CIG A DAY  Vaping Use   Vaping status: Never Used  Substance and Sexual Activity   Alcohol use: No    Alcohol/week: 0.0 standard drinks of alcohol   Drug use: No   Sexual activity: Yes    Birth control/protection: Condom    Comment: both  Other Topics Concern   Not on file  Social History Narrative   Not on file   Social Drivers of Health   Financial Resource Strain: Low Risk  (04/19/2024)   Received from  [facility]   Overall Financial Resource Strain (CARDIA)    Difficulty of Paying Living Expenses: Not hard at all  Food Insecurity: Food Insecurity Present (04/19/2024)   Received from [facility]   Hunger Vital Sign    Within the past 12 months, you worried that your food would run out before you got the money to buy more.: Sometimes true    Within the past 12 months, the food you bought just didn't last and you didn't have money to get more.: Sometimes true  Transportation Needs: No Transportation Needs (04/19/2024)   Received from [facility] - Transportation    Lack of Transportation (Medical): No    Lack of Transportation (Non-Medical): No  Physical Activity: Insufficiently Active (04/19/2024)   Received from [facility]   Exercise Vital Sign    On average, how many days per week do you engage in moderate to strenuous exercise (like a brisk walk)?: 2 days    On average, how many minutes do you engage in exercise at this level?: 50 min  Stress: No Stress Concern Present (04/19/2024)   Received from [facility] -  Occupational Stress Questionnaire    Feeling of Stress : Only a little  Social Connections: Socially Integrated (04/19/2024)   Received from [facility]   Social Network    How would you rate your social network (family, work, friends)?: Good participation with social networks    Allergies:  Allergies  Allergen Reactions   Dorethia Harm ] Other (See Comments)    Heart flutters   Banana Itching    Current Medications: Current Outpatient Medications  Medication Sig Dispense Refill   QUEtiapine  (SEROQUEL ) 400 MG tablet Take 1.5 tablets (600 mg total) by mouth at bedtime. 45 tablet 1   albuterol  (VENTOLIN  HFA) 108 (90 Base) MCG/ACT inhaler Inhale 2 puffs into the lungs every 6 (six) hours as needed for wheezing or shortness of breath. 3 each 0   Albuterol -Budesonide (AIRSUPRA) 90-80 MCG/ACT AERO Inhale 2 puffs  into the lungs every 4 (four) hours as needed (For coughing and wheezing.). 10.7 g 1   AMBULATORY NON FORMULARY MEDICATION Medication Name: GI Cocktail 45 ml 2% viscous lidocaine 45 ml bentyl  10mg /38ml 135 ml Mylanta SIG 5 ml bid prn gas and stomach pain-do not use more than 3 days in a row 300 mL 2   cetirizine (ZYRTEC) 10 MG tablet Take 1 tablet (10 mg total) by mouth daily as needed for allergies (Can take an extra dose during flare ups.). 180 tablet 1   cyclobenzaprine  (FEXMID ) 7.5 MG tablet TAKE 1 TABLET(7.5 MG) BY MOUTH THREE TIMES DAILY AS NEEDED FOR MUSCLE SPASMS 30 tablet 3   diclofenac  (VOLTAREN ) 75 MG EC tablet TAKE 1 TABLET(75 MG) BY MOUTH TWICE DAILY 180 tablet 0   dicyclomine  (BENTYL ) 10 MG capsule Take 1 capsule (10 mg total) by mouth in the morning, at noon, in the evening, and at bedtime. Please keep your upcoming appointment with Dr. Charlanne on 2-27 for any further refills. 120 capsule 0   EPINEPHrine (NEFFY) 2 MG/0.1ML SOLN Place 2 sprays into the nose as needed. 2 each 2   gabapentin  (NEURONTIN ) 300 MG capsule Take 1 capsule (300 mg total) by mouth 3 (three) times daily. 30 capsule 1   hydrochlorothiazide  (HYDRODIURIL ) 25 MG tablet TAKE 1 TABLET(25 MG) BY MOUTH DAILY 90 tablet 1   hydrOXYzine  (ATARAX ) 50 MG tablet Take 1 tablet (50 mg total) by mouth 2 (two) times daily as needed. 120 tablet 1   lamoTRIgine  (LAMICTAL ) 150 MG tablet Take 1 tablet (150 mg total) by mouth daily. 60 tablet 0   linaclotide  (LINZESS ) 290 MCG CAPS capsule Take 1 capsule (290 mcg total) by mouth daily before breakfast. Please call [phone] to schedule an office visit for more refills 90 capsule 0   losartan (COZAAR) 25 MG tablet Take 25 mg by mouth daily.     mirtazapine  (REMERON  SOL-TAB) 30 MG disintegrating tablet DISSOLVE 1 TABLET(15 MG) ON THE TONGUE AT BEDTIME 45 tablet 0   Olopatadine HCl (PATADAY) 0.2 % SOLN Place 1 drop into both eyes 1 day or 1 dose. 7.5 mL 1   ondansetron  (ZOFRAN -ODT) 8 MG  disintegrating tablet Take 1 tablet (8 mg total) by mouth every 8 (eight) hours as needed for nausea or vomiting. 30 tablet 0   pantoprazole  (PROTONIX ) 40 MG tablet Take 1 tablet (40 mg total) by mouth 2 (two) times daily. 180 tablet 0   prochlorperazine  (COMPAZINE ) 25 MG suppository Use 1 suppository rectally every 6-8 hours as needed nausea/vomiting 12 suppository 3   propranolol  (INDERAL ) 20 MG tablet Take 1 tablet (20 mg total) by  mouth at bedtime. 30 tablet 1   traZODone  (DESYREL ) 100 MG tablet Take 1 tablet (100 mg total) by mouth at bedtime as needed for sleep. 30 tablet 1   varenicline (CHANTIX) 1 MG tablet Take 1 mg by mouth daily.     No current facility-administered medications for this visit.    ROS: Review of Systems  All other systems reviewed and are negative.   Objective:  Objective: Psychiatric Specialty Exam: General Appearance: Casual, fairly groomed  Eye Contact:  Good    Speech:  Clear, coherent, normal rate, spontaneous  Volume:  Normal   Mood:  see above  Affect:  Appropriate, congruent, full range  Thought Content: Logical, linear  Suicidal Thoughts: see subjective  Thought Process:  Coherent, goal-directed, circumstantial   Orientation:  A&Ox4   Memory:  Immediate good  Judgment:  Fair   Insight:  Fair  Concentration:  Attention and concentration good   Recall:  Good  Fund of Knowledge: Good  Language: Good, fluent  Psychomotor Activity: Normal  Akathisia:  NA   AIMS (if indicated): NA   Assets:   Communication Skills Desire for Improvement Housing Social Support  ADL's:  Intact  Cognition: WNL  Sleep: see above  Appetite: see above    Physical Exam Vitals and nursing note reviewed.  HENT:     Head: Normocephalic and atraumatic.   Eyes:     Conjunctiva/sclera: Conjunctivae normal.   Pulmonary:     Effort: Pulmonary effort is normal. No respiratory distress.   Musculoskeletal:        General: Normal range of motion.   Skin:     General: Skin is warm and dry.   Neurological:     General: No focal deficit present.      Metabolic Disorder Labs: Lab Results  Component Value Date   HGBA1C 5.8 (H) 04/02/2023   No results found for: PROLACTIN Lab Results  Component Value Date   CHOL 198 04/02/2023   TRIG 194 (H) 04/02/2023   HDL 36 (L) 04/02/2023   CHOLHDL 5.5 (H) 04/02/2023   VLDL 29.8 04/24/2015   LDLCALC 127 (H) 04/02/2023   LDLCALC 89 04/24/2015   Lab Results  Component Value Date   TSH 1.750 04/02/2023   TSH 1.06 05/29/2021    Therapeutic Level Labs: No results found for: LITHIUM No results found for: VALPROATE No results found for: CBMZ  Screenings:  GAD-7    Flowsheet Row Clinical Support from 06/22/2024 in [facility] Office Visit from 07/25/2022 in [facility] Video Visit from 01/16/2022 in [facility] Video Visit from 10/22/2021 in [facility] Video Visit from 07/16/2021 in [facility]  Total GAD-7 Score 15 4 2 4 18    PHQ2-9    Flowsheet Row Clinical Support from 06/22/2024 in [facility] Office Visit from 07/25/2022 in [facility] Office Visit from 07/18/2022 in [facility] A Dept Of [place]. [facility] Office Visit from 03/27/2022 in [facility] - A Dept Of [place]. [facility] Video Visit from 01/16/2022 in [facility]  PHQ-2 Total Score 0 0 0 0 0  PHQ-9 Total Score -- 6 -- -- 1   Flowsheet Row ED from 05/06/2024 in [facility] Emergency Department at [facility] ED from 04/18/2024 in [facility] Emergency Department at [facility] ED from  09/22/2023 in [facility] Emergency Department at [facility]  C-SSRS RISK CATEGORY No Risk No Risk No Risk     Marlo Masson,  MD 06/22/2024, 5:02 PM

## 2024-06-22 ENCOUNTER — Other Ambulatory Visit: Payer: Self-pay

## 2024-06-22 ENCOUNTER — Ambulatory Visit (INDEPENDENT_AMBULATORY_CARE_PROVIDER_SITE_OTHER): Admitting: Allergy and Immunology

## 2024-06-22 ENCOUNTER — Ambulatory Visit (INDEPENDENT_AMBULATORY_CARE_PROVIDER_SITE_OTHER): Admitting: Student in an Organized Health Care Education/Training Program

## 2024-06-22 ENCOUNTER — Encounter: Payer: Self-pay | Admitting: Allergy and Immunology

## 2024-06-22 ENCOUNTER — Encounter (HOSPITAL_COMMUNITY): Payer: Self-pay | Admitting: Student in an Organized Health Care Education/Training Program

## 2024-06-22 VITALS — BP 141/84 | HR 76 | Ht 65.0 in | Wt 210.0 lb

## 2024-06-22 VITALS — BP 132/80 | HR 66 | Temp 97.9°F | Ht 65.75 in | Wt 210.5 lb

## 2024-06-22 DIAGNOSIS — Z636 Dependent relative needing care at home: Secondary | ICD-10-CM

## 2024-06-22 DIAGNOSIS — G4700 Insomnia, unspecified: Secondary | ICD-10-CM | POA: Diagnosis not present

## 2024-06-22 DIAGNOSIS — J4520 Mild intermittent asthma, uncomplicated: Secondary | ICD-10-CM | POA: Diagnosis not present

## 2024-06-22 DIAGNOSIS — F17210 Nicotine dependence, cigarettes, uncomplicated: Secondary | ICD-10-CM

## 2024-06-22 DIAGNOSIS — T781XXA Other adverse food reactions, not elsewhere classified, initial encounter: Secondary | ICD-10-CM

## 2024-06-22 DIAGNOSIS — F411 Generalized anxiety disorder: Secondary | ICD-10-CM | POA: Diagnosis not present

## 2024-06-22 DIAGNOSIS — H1013 Acute atopic conjunctivitis, bilateral: Secondary | ICD-10-CM | POA: Diagnosis not present

## 2024-06-22 DIAGNOSIS — Z9189 Other specified personal risk factors, not elsewhere classified: Secondary | ICD-10-CM | POA: Diagnosis not present

## 2024-06-22 DIAGNOSIS — T781XXD Other adverse food reactions, not elsewhere classified, subsequent encounter: Secondary | ICD-10-CM

## 2024-06-22 DIAGNOSIS — H1010 Acute atopic conjunctivitis, unspecified eye: Secondary | ICD-10-CM

## 2024-06-22 DIAGNOSIS — J301 Allergic rhinitis due to pollen: Secondary | ICD-10-CM

## 2024-06-22 DIAGNOSIS — F39 Unspecified mood [affective] disorder: Secondary | ICD-10-CM

## 2024-06-22 MED ORDER — TRAZODONE HCL 100 MG PO TABS: 100.0000 mg | ORAL_TABLET | Freq: Every evening | ORAL | 1 refills | Status: DC | PRN

## 2024-06-22 MED ORDER — PROPRANOLOL HCL 20 MG PO TABS: 20.0000 mg | ORAL_TABLET | Freq: Every day | ORAL | 1 refills | Status: DC

## 2024-06-22 MED ORDER — AIRSUPRA 90-80 MCG/ACT IN AERO: 2.0000 | INHALATION_SPRAY | RESPIRATORY_TRACT | 1 refills | Status: DC | PRN

## 2024-06-22 MED ORDER — NEFFY 2 MG/0.1ML NA SOLN: 2.0000 | NASAL | 2 refills | Status: DC | PRN

## 2024-06-22 MED ORDER — GABAPENTIN 300 MG PO CAPS: 300.0000 mg | ORAL_CAPSULE | Freq: Three times a day (TID) | ORAL | 1 refills | Status: DC

## 2024-06-22 MED ORDER — HYDROXYZINE HCL 50 MG PO TABS: 50.0000 mg | ORAL_TABLET | Freq: Two times a day (BID) | ORAL | 1 refills | Status: DC | PRN

## 2024-06-22 MED ORDER — QUETIAPINE FUMARATE 400 MG PO TABS: 600.0000 mg | ORAL_TABLET | Freq: Every day | ORAL | 1 refills | Status: DC

## 2024-06-22 MED ORDER — OLOPATADINE HCL 0.2 % OP SOLN: 1.0000 [drp] | OPHTHALMIC | 1 refills | Status: DC

## 2024-06-22 MED ORDER — CETIRIZINE HCL 10 MG PO TABS: 10.0000 mg | ORAL_TABLET | Freq: Every day | ORAL | 1 refills | Status: DC | PRN

## 2024-06-22 NOTE — Patient Instructions (Addendum)
  1. Allergen avoidance measures - pollens, banana  2. Use nicotine  substitutes to replace tobacco smoke exposure  3. If needed:   A. Airsupra - 2 inhalations every 4-6 hours (coupon)  B. Cetirizine 10 mg - 1 tablet 1 time per day  C. Pataday - 1 drop each eye 1 time per day  D. Neffy, benadryl , MD/ER evaluation for allergic reaction    4. Further evaluation for Spring issues next year???  5. Influenza = Tamiflu. Covid = Paxlovid  6. Return to clinic in 6 months or earlier if problem

## 2024-06-22 NOTE — Progress Notes (Unsigned)
 [place] - [place] - [place] - [place] - [place]   NEW PATIENT NOTE  Referring Provider: No ref. provider found Primary Provider: Patient, No Pcp Per Date of office visit: 06/22/2024    Subjective:   Chief Complaint:  Jasmine Jackson (DOB: 1971-07-24) is a 53 y.o. female who presents to the clinic on 06/22/2024 with a chief complaint of Asthma, Food allergy (Banana and vinegar), Allergy, Allergy Testing (Requesting for allergy test), and Establish Care .     HPI: Mel presents to this clinic in evaluation of allergies and asthma.  She has a history of childhood bronchitis which was diagnosed as asthma in her 44s but was very well-maintained and controlled and she had rare use for short acting bronchodilator for decades.  However, this spring, with extensive outdoor exposure to pollen, she ended up developing significant wheezing and coughing and requiring an emergency room visit for which she was given prednisone .  Now that the spring has resolved she has no significant lower airway symptoms and has no need to use the short acting bronchodilator.  She does not have a history of exercise-induced bronchospastic symptoms or cold air induced bronchospastic symptoms.  She also had difficulty this spring with her eyes being extremely itchy and she has had some nasal congestion as well.  Fortunately, since the spring has resolved that issue has resolved as well.  She has a history of developing a reaction when eating banana.  Her mouth and her throat gets intense itching and she does not eat this food at this point in time.  She has a history of developing a large local reaction to wasp sting without any associated systemic or constitutional symptoms.  She has a multi decade history of smoking tobacco at around 20 cigarettes/day which she decreased to 10 cigarettes/day over the course of the past 2 months while using Chantix.  Past Medical History:  Diagnosis Date   Anemia     06/19/21-pt has no recollection of this dx   Anxiety    Anxiety and depression    Asthma    Bipolar 1 disorder (HCC)    Depression    Excessive daytime sleepiness 11/18/2017   GERD (gastroesophageal reflux disease)    Heart murmur    Hemorrhoids    Hiatal hernia    Hypertension    Internal hemorrhoids    Irritable bowel disease 2014   Toe injury 11/18/2017    Past Surgical History:  Procedure Laterality Date   ADENOIDECTOMY     COLONOSCOPY  2021   HEMORRHOID SURGERY     INGUINAL HERNIA REPAIR     PARTIAL HYSTERECTOMY     fibroids   TONSILLECTOMY     UPPER GASTROINTESTINAL ENDOSCOPY      Allergies as of 06/22/2024       Reactions   Asa [aspirin ] Other (See Comments)   Heart flutters   Banana Itching        Medication List    albuterol  108 (90 Base) MCG/ACT inhaler Commonly known as: VENTOLIN  HFA Inhale 2 puffs into the lungs every 6 (six) hours as needed for wheezing or shortness of breath.   albuterol  (5 MG/ML) 0.5% nebulizer solution Commonly known as: PROVENTIL  Take 0.5 mLs (2.5 mg total) by nebulization every 6 (six) hours as needed for wheezing or shortness of breath.   AMBULATORY NON FORMULARY MEDICATION Medication Name: GI Cocktail 45 ml 2% viscous lidocaine 45 ml bentyl  10mg /66ml 135 ml Mylanta SIG 5 ml bid prn gas and stomach  pain-do not use more than 3 days in a row   amLODipine  5 MG tablet Commonly known as: NORVASC  TAKE 1 TABLET EVERY DAY What changed: additional instructions   cyclobenzaprine  7.5 MG tablet Commonly known as: FEXMID  TAKE 1 TABLET(7.5 MG) BY MOUTH THREE TIMES DAILY AS NEEDED FOR MUSCLE SPASMS   diclofenac  75 MG EC tablet Commonly known as: VOLTAREN  TAKE 1 TABLET(75 MG) BY MOUTH TWICE DAILY   dicyclomine  10 MG capsule Commonly known as: BENTYL  Take 1 capsule (10 mg total) by mouth in the morning, at noon, in the evening, and at bedtime. Please keep your upcoming appointment with Dr. Charlanne on 2-27 for any further refills.    gabapentin  400 MG capsule Commonly known as: Neurontin  Take 1 capsule (400 mg total) by mouth 3 (three) times daily.   hydrochlorothiazide  25 MG tablet Commonly known as: HYDRODIURIL  TAKE 1 TABLET(25 MG) BY MOUTH DAILY   hydrOXYzine  50 MG tablet Commonly known as: ATARAX  Take 1 tablet (50 mg total) by mouth 2 (two) times daily as needed.   lamoTRIgine  150 MG tablet Commonly known as: LaMICtal  Take 1 tablet (150 mg total) by mouth daily.   linaclotide  290 MCG Caps capsule Commonly known as: Linzess  Take 1 capsule (290 mcg total) by mouth daily before breakfast. Please call [phone] to schedule an office visit for more refills   losartan 25 MG tablet Commonly known as: COZAAR Take 25 mg by mouth daily.   mirtazapine  30 MG disintegrating tablet Commonly known as: REMERON  SOL-TAB DISSOLVE 1 TABLET(15 MG) ON THE TONGUE AT BEDTIME   ondansetron  8 MG disintegrating tablet Commonly known as: ZOFRAN -ODT Take 1 tablet (8 mg total) by mouth every 8 (eight) hours as needed for nausea or vomiting.   pantoprazole  40 MG tablet Commonly known as: PROTONIX  Take 1 tablet (40 mg total) by mouth 2 (two) times daily.   prochlorperazine  25 MG suppository Commonly known as: COMPAZINE  Use 1 suppository rectally every 6-8 hours as needed nausea/vomiting   propranolol  20 MG tablet Commonly known as: INDERAL  Take 1 tablet (20 mg total) by mouth at bedtime.   QUEtiapine  400 MG 24 hr tablet Commonly known as: SEROquel  XR Take 1 tablet (400 mg total) by mouth at bedtime.   sucralfate 1 g tablet Commonly known as: CARAFATE Take 1 g by mouth with breakfast, with lunch, and with evening meal.   traZODone  100 MG tablet Commonly known as: DESYREL  Take 1 tablet (100 mg total) by mouth at bedtime as needed for sleep.   varenicline 1 MG tablet Commonly known as: CHANTIX Take 1 mg by mouth daily.    Review of systems negative except as noted in HPI / PMHx or noted below:  Review of  Systems  Constitutional: Negative.   HENT: Negative.    Eyes: Negative.   Respiratory: Negative.    Cardiovascular: Negative.   Gastrointestinal: Negative.   Genitourinary: Negative.   Musculoskeletal: Negative.   Skin: Negative.   Neurological: Negative.   Endo/Heme/Allergies: Negative.   Psychiatric/Behavioral: Negative.      Family History  Problem Relation Age of Onset   Asthma Mother    Breast cancer Mother 72   Colon polyps Mother 44   Asthma Maternal Aunt    Breast cancer Maternal Aunt        x4   Stomach cancer Maternal Aunt    Prostate cancer Maternal Uncle        x2   Breast cancer Maternal Grandmother    Breast cancer Paternal Grandmother  Colon cancer Neg Hx    Esophageal cancer Neg Hx    Rectal cancer Neg Hx     Social History   Socioeconomic History   Marital status: Single    Spouse name: Not on file   Number of children: 1   Years of education: Not on file   Highest education level: Not on file  Occupational History   Occupation: Disability  Tobacco Use   Smoking status: Every Day    Current packs/day: 0.50    Average packs/day: 0.5 packs/day for 37.0 years (18.5 ttl pk-yrs)    Types: Cigarettes   Smokeless tobacco: Never   Tobacco comments:    10  CIG A DAY  Vaping Use   Vaping status: Never Used  Substance and Sexual Activity   Alcohol use: No    Alcohol/week: 0.0 standard drinks of alcohol   Drug use: No   Sexual activity: Yes    Birth control/protection: Condom    Comment: both  Other Topics Concern   Not on file  Social History Narrative   Not on file   Social Drivers of Health   Financial Resource Strain: Low Risk  (04/19/2024)   Received from [facility]   Overall Financial Resource Strain (CARDIA)    Difficulty of Paying Living Expenses: Not hard at all  Food Insecurity: Food Insecurity Present (04/19/2024)   [facility]   Hunger Vital Sign    Within the past 12 months, you worried that your food would  run out before you got the money to buy more.: Sometimes true    Within the past 12 months, the food you bought just didn't last and you didn't have money to get more.: Sometimes true  Transportation Needs: No Transportation Needs (04/19/2024)   Received from [facility] - Transportation    Lack of Transportation (Medical): No    Lack of Transportation (Non-Medical): No  Physical Activity: Insufficiently Active (04/19/2024)   Received from [facility]   Exercise Vital Sign    On average, how many days per week do you engage in moderate to strenuous exercise (like a brisk walk)?: 2 days    On average, how many minutes do you engage in exercise at this level?: 50 min  Stress: No Stress Concern Present (04/19/2024)   Received from [facility] - Occupational Stress Questionnaire    Feeling of Stress : Only a little  Social Connections: Socially Integrated (04/19/2024)   Received from [facility]   Social Network    How would you rate your social network (family, work, friends)?: Good participation with social networks  Intimate Partner Violence: Not At Risk (04/19/2024)   Received from [facility]   HITS    Over the last 12 months how often did your partner physically hurt you?: Never    Over the last 12 months how often did your partner insult you or talk down to you?: Never    Over the last 12 months how often did your partner threaten you with physical harm?: Never    Over the last 12 months how often did your partner scream or curse at you?: Never    Objective:   Vitals:   06/22/24 1018  BP: 132/80  Pulse: 66  Temp: 97.9 F (36.6 C)  SpO2: 98%   Height: 5' 5.75 (167 cm) Weight: 210 lb 8 oz (95.5 kg)  Physical Exam Constitutional:  Appearance: She is not diaphoretic.  HENT:     Head: Normocephalic.     Right Ear: Tympanic membrane, ear canal and external ear normal.     Left Ear: Tympanic membrane, ear canal  and external ear normal.     Nose: Nose normal. No mucosal edema or rhinorrhea.     Mouth/Throat:     Pharynx: Uvula midline. No oropharyngeal exudate.  Eyes:     Conjunctiva/sclera: Conjunctivae normal.  Neck:     Thyroid : No thyromegaly.     Trachea: Trachea normal. No tracheal tenderness or tracheal deviation.  Cardiovascular:     Rate and Rhythm: Normal rate and regular rhythm.     Heart sounds: Normal heart sounds, S1 normal and S2 normal. No murmur heard. Pulmonary:     Effort: No respiratory distress.     Breath sounds: Normal breath sounds. No stridor. No wheezing or rales.  Lymphadenopathy:     Head:     Right side of head: No tonsillar adenopathy.     Left side of head: No tonsillar adenopathy.     Cervical: No cervical adenopathy.  Skin:    Findings: No erythema or rash.     Nails: There is no clubbing.  Neurological:     Mental Status: She is alert.     Diagnostics: Allergy skin tests were not performed.   Spirometry was performed and demonstrated an FEV1 of 2.20 @ 93% of predicted. FEV1/FVC = 0.80  Assessment and Plan:    1. Asthma, mild intermittent, well-controlled   2. Tobacco smoker, less than 10 cigarettes per day   3. Seasonal allergic rhinitis due to pollen   4. Seasonal allergic conjunctivitis   5. Pollen-food allergy, initial encounter    1. Allergen avoidance measures - pollens, banana  2. Use nicotine  substitutes to replace tobacco smoke exposure  3. If needed:   A. Airsupra - 2 inhalations every 4-6 hours (coupon)  B. Cetirizine 10 mg - 1 tablet 1 time per day  C. Pataday - 1 drop each eye 1 time per day  D. Neffy, benadryl , MD/ER evaluation for allergic reaction    4. Further evaluation for Spring issues next year???  5. Influenza = Tamiflu. Covid = Paxlovid  6. 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  I have given her a plan of action  to utilize should she develop respiratory tract symptoms as she moves forward over the course of the next year.  I do not know what is going to happen next spring but her plan may change significantly if she does develop inflammation of her airway once the pollen season gets rolling again.  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  She is smoking and I had a talk with her today about the need to find some substitutes to deal with that issue.  I will see her back in this clinic in 6 months or earlier if there is a problem.   Camellia DOROTHA Denis, MD Allergy / Immunology [facility] 

## 2024-06-23 ENCOUNTER — Telehealth: Payer: Self-pay | Admitting: Allergy and Immunology

## 2024-06-23 ENCOUNTER — Encounter: Payer: Self-pay | Admitting: Allergy and Immunology

## 2024-06-23 NOTE — Telephone Encounter (Signed)
 Patient called and stated that she is needing a PA on her ceterezine. Insurance is [facility] MCD dual complete. Pharmacy is [facility] on [address] and [address] Patients call back number is [phone]

## 2024-06-28 ENCOUNTER — Telehealth: Payer: Self-pay

## 2024-06-28 NOTE — Telephone Encounter (Signed)
 Per test claims- OTC meds are not covered.

## 2024-06-28 NOTE — Telephone Encounter (Signed)
 Per test claim OTC's not covered. No PA needed.

## 2024-06-30 NOTE — Telephone Encounter (Signed)
 I called the patient, she verified her birthdate. Went on with the reason for the call and patient instructed me that she had to call us  back. So I am not sure if she understood what I was telling her.

## 2024-07-01 ENCOUNTER — Telehealth: Payer: Self-pay

## 2024-07-01 NOTE — Telephone Encounter (Signed)
*  AA  Pharmacy Patient Advocate Encounter   Received notification from CoverMyMeds that prior authorization for Neffy  2MG /0.1ML solution  is required/requested.   Insurance verification completed.   The patient is insured through [facility] .   Per test claim:  Injectable epinephrine   is preferred by the insurance.  If suggested medication is appropriate, Please send in a new RX and discontinue this one. If not, please advise as to why it's not appropriate so that we may request a Prior Authorization. Please note, some preferred medications may still require a PA.  If the suggested medications have not been trialed and there are no contraindications to their use, the PA will not be submitted, as it will not be approved.  CMM Key: [id number]

## 2024-07-02 NOTE — Telephone Encounter (Signed)
 Patient mom states that she picked it up for $5.

## 2024-07-02 NOTE — Telephone Encounter (Signed)
 Okay to send EpiPen ?

## 2024-07-02 NOTE — Telephone Encounter (Signed)
 Needs a PA started on Neffy  per patient.

## 2024-07-05 MED ORDER — EPINEPHRINE 0.3 MG/0.3ML IJ SOAJ: 0.3000 mg | INTRAMUSCULAR | 1 refills | Status: AC | PRN

## 2024-07-05 NOTE — Telephone Encounter (Signed)
 Tried calling and it went straight to voicemail and mailbox full. Epipen  has been sent to [facility] on file.

## 2024-07-05 NOTE — Addendum Note (Signed)
 Addended by: ONEITA CHRISTIANS D on: 07/05/2024 02:28 PM   Modules accepted: Orders

## 2024-07-06 NOTE — Telephone Encounter (Signed)
 I called the patient to inform her. Patient understood. No questions.

## 2024-07-21 ENCOUNTER — Telehealth (HOSPITAL_COMMUNITY): Payer: Self-pay | Admitting: Student in an Organized Health Care Education/Training Program

## 2024-07-21 NOTE — Telephone Encounter (Signed)
 Patient called to reschedule her appointment on 07/30/2024 with Dr. Homer due to having surgery on the same day. The closest appt I could find was on 09/05. Patient mentioned that she needs meds to hold her over until she has her appointment in September.  Please advise.  Thank you.

## 2024-07-22 ENCOUNTER — Telehealth (HOSPITAL_COMMUNITY): Payer: Self-pay | Admitting: Student in an Organized Health Care Education/Training Program

## 2024-07-22 DIAGNOSIS — F411 Generalized anxiety disorder: Secondary | ICD-10-CM

## 2024-07-22 DIAGNOSIS — Z636 Dependent relative needing care at home: Secondary | ICD-10-CM

## 2024-07-22 DIAGNOSIS — G4700 Insomnia, unspecified: Secondary | ICD-10-CM

## 2024-07-22 DIAGNOSIS — F419 Anxiety disorder, unspecified: Secondary | ICD-10-CM

## 2024-07-22 DIAGNOSIS — F39 Unspecified mood [affective] disorder: Secondary | ICD-10-CM

## 2024-07-22 MED ORDER — MIRTAZAPINE 30 MG PO TBDP: ORAL_TABLET | ORAL | 0 refills | Status: DC

## 2024-07-22 MED ORDER — QUETIAPINE FUMARATE 400 MG PO TABS: 600.0000 mg | ORAL_TABLET | Freq: Every day | ORAL | 1 refills | Status: DC

## 2024-07-22 MED ORDER — TRAZODONE HCL 100 MG PO TABS: 100.0000 mg | ORAL_TABLET | Freq: Every evening | ORAL | 1 refills | Status: DC | PRN

## 2024-07-22 MED ORDER — LAMOTRIGINE 150 MG PO TABS: 150.0000 mg | ORAL_TABLET | Freq: Every day | ORAL | 0 refills | Status: DC

## 2024-07-22 MED ORDER — GABAPENTIN 300 MG PO CAPS: 300.0000 mg | ORAL_CAPSULE | Freq: Three times a day (TID) | ORAL | 1 refills | Status: DC

## 2024-07-22 MED ORDER — HYDROXYZINE HCL 50 MG PO TABS
50.0000 mg | ORAL_TABLET | Freq: Two times a day (BID) | ORAL | 1 refills | Status: DC | PRN
Start: 2024-07-22 — End: 2024-08-27

## 2024-07-22 NOTE — Telephone Encounter (Signed)
 I was informed patient would have to cancel upcoming appointment due to upcoming surgery on the same day.  Attempted to contact patient regarding medication refill request at [phone], no answer, LVM.  Will proceed with refilling current medication regimen below: Lamotrigine  150 mg daily Hydroxyzine  50 mg twice daily as needed Remeron  30 mg disintegrating tablet Seroquel  600 mg nightly Trazodone  100 mg nightly Chantix 1 mg daily Gabapentin  300 mg 3 times daily   Contacted the offices of Dr. Pura [phone]), to confirm with front desk staff that patient will have a repeat EKG at her next PCP appointment.   Tajuana Kniskern Carrin Carrero, MD PGY-3, [facility]iat[facility]

## 2024-07-29 ENCOUNTER — Encounter (HOSPITAL_COMMUNITY): Admitting: Student in an Organized Health Care Education/Training Program

## 2024-07-30 ENCOUNTER — Telehealth (HOSPITAL_COMMUNITY): Admitting: Student in an Organized Health Care Education/Training Program

## 2024-08-10 ENCOUNTER — Ambulatory Visit: Admitting: Podiatry

## 2024-08-13 ENCOUNTER — Telehealth: Payer: Self-pay | Admitting: Gastroenterology

## 2024-08-13 ENCOUNTER — Encounter: Admitting: Gastroenterology

## 2024-08-13 NOTE — Telephone Encounter (Signed)
 Called patient to reschedule hemorrhoid banding due to Dr. JAYSON being out of the office.  Rescheduled her for 10/29; however, patient said she felt she needed to be seen sooner as she was still having significant bleeding.  Please call patient and advise.  Thank you.

## 2024-08-13 NOTE — Telephone Encounter (Signed)
 Left patient a detailed vm offering her an appt with Dr. San on Wednesday, 08/25/24 at 10 am. I have asked patient to call back or send MyChart message letting us  know if this appt will work.

## 2024-08-16 NOTE — Telephone Encounter (Signed)
 Called and spoke with patient. Patient is not available on 9/3 but she is able to come in for an appt on 09/02/24 at 2:40 pm. Patient had no concerns.

## 2024-08-25 NOTE — Progress Notes (Signed)
 Patient ID:  Jasmine Jackson is a 53 y.o. (DOB August 14, 1971) female.  Assessment and Plan   1. Medicare annual wellness visit, subsequent Lipid Panel  2. Primary hypertension  CBC And Differential   Comprehensive Metabolic Panel  3. Class 1 obesity due to excess calories with serious comorbidity and body mass index (BMI) of 34.0 to 34.9 in adult    4. Acanthosis nigricans    5. Tobacco use disorder    6. Bipolar disorder in full remission, most recent episode unspecified type (*)    7. Generalized anxiety disorder with panic attacks    8. PTSD (post-traumatic stress disorder)    9. Obsessive-compulsive disorder, unspecified type    10. Well woman exam  Ambulatory referral to Obstetrics / Gynecology  11. Unintentional weight loss  CBC And Differential   Comprehensive Metabolic Panel   XR Chest Pa And Lateral   Sedimentation Rate, Automated   C-Reactive Protein   TSH+Free T4   HIV-1/O/2, 4th Generation   QuantiFERON-TB Gold Plus Blood  12. Meralgia paresthetica of right side    13. Color change in pigmented skin lesion  Ambulatory referral to Dermatology    See below for Medicare specific topics.  Physical exam did not demonstrate any abnormalities.  Routine annual fasting labs drawn today.  See result notes for communication to patient.  Return to clinic in one year for repeat physical examination with fasting labs. Patient is well controlled on hydrochlorothiazide  25 mg daily and losartan 25 mg daily.  Continued hydrochlorothiazide  and losartan at current doses.  Will check routine labs today.  Results expected tomorrow.  Follow-up in 6 months. Recommended healthy eating and increased activity.  Continue to monitor weight at future routine visits. Stable per patient.  No adjustment in treatment is warranted.  Follow-up in 1 year. Patient resumed smoking cigarettes within the last year.  Patient declined discussing cessation.  Instructed patient to contact the clinic if she begins to  contemplate cessation.  Otherwise, follow-up in 1 year.    (6., 7., 8., and 9.)  Stable per patient as managed by behavioral medicine.  Continue management through behavioral medicine.  Follow-up with this clinic in 1 year, or sooner as needed. Referral to OB/GYN placed at patient's request. Etiology of patient symptoms is currently unknown.  Unintentional weight loss workup initiated: CBC, CMP, sed rate, CRP, TSH and free T4, HIV, Quantiferon gold, and chest x-ray.  Will contact patient with results and recommendations after results have been received. Patient's description of symptoms is highly suggestive of meralgia paresthetica of the right thigh.  Discussed with patient that this was a benign and self-limiting concern and should improve within the next few weeks.  Recommended patient avoid tight pants that pinch at the hips.  Follow-up as needed for persistent or worsening symptoms. Referral to dermatology placed at patient's request.   Risks, benefits, and alternatives of the medications and treatment plan prescribed today were discussed, and patient expressed understanding.    Patient's Medications       * Accurate as of August 25, 2024 11:59 PM. Reflects encounter med changes as of last refresh          Continued Medications      Instructions  * albuterol  sulfate HFA 108 (90 Base) MCG/ACT inhaler Commonly known as: PROVENTIL ,VENTOLIN ,PROAIR   INHALE 2 PUFFS INTO THE LUNGS EVERY 6 HOURS AS NEEDED FOR WHEEZING OR SHORTNESS OF BREATH   * albuterol  0.63 mg/3 mL nebulizer solution Commonly known as: ACCUNEB   0.63 mg,  Nebulization, Every 6 hours as needed   cyclobenzaprine  7.5 MG tablet Commonly known as: FEXMID   7.5 mg, Oral, Daily as needed   diclofenac  sodium 75 mg EC tablet Commonly known as: VOLTAREN   75 mg, 2 times a day   dicyclomine  20 mg tablet Commonly known as: BENTYL   20 mg, Oral, Every 6 hours   gabapentin  400 mg capsule Commonly known as: NEURONTIN    400 mg, 3 times a day   hydroCHLOROthiazide  25 mg tablet  25 mg, Oral, Daily   hydrOXYzine  HCl 50 mg tablet Commonly known as: ATARAX   50 mg, 3 times a day   lamoTRIgine  100 mg tablet Commonly known as: LAMICTAL   100 mg, Daily   LINZESS  290 MCG capsule Generic drug: linaclotide   290 mcg, Daily   losartan potassium 25 mg tablet Commonly known as: COZAAR  25 mg, Oral, Daily   mirtazapine  30 MG disintegrating tablet Commonly known as: REMERON  SOL-TAB  30 mg, At bedtime   Nebulizer/Tubing/Mouthpiece Kit  Use to administer nebulizer solution as prescribed.   ondansetron  8 mg disintegrating tablet Commonly known as: ZOFRAN -ODT  DISSOLVE 1 TABLET(8 MG) ON THE TONGUE EVERY 8 HOURS AS NEEDED FOR NAUSEA OR VOMITING   pantoprazole  sodium 40 mg tablet Commonly known as: PROTONIX   40 mg, 2 times a day   propranolol  HCl 20 mg tablet Commonly known as: INDERAL   20 mg, Daily   quetiapine  400 MG 24 hr tablet Commonly known as: SEROQUEL  XR  600 mg, At bedtime   traZODone  100 mg tablet Commonly known as: DESYREL   100 mg      * * This list has 2 medication(s) that are the same as other medications prescribed for you. Read the directions carefully, and ask your doctor or other care provider to review them with you.          Discontinued Medications    sucralfate 1 g tablet Commonly known as: CARAFATE Stopped by: Yvonna Gamble, PA   valacyclovir 1000 mg tablet Commonly known as: VALTREX Stopped by: Yvonna Gamble, PA   varenicline 1 mg tablet Commonly known as: CHANTIX Stopped by: Yvonna Gamble, PA   varenicline tartrate (Starter) 0.5 MG X 11 & 1 MG X 42 Tbpk tablet Commonly known as: CHANTIX Stopped by: Yvonna Gamble, PA        Orders Placed This Encounter  Procedures  . QuantiFERON-TB Gold Plus Blood  . XR Chest Pa And Lateral  . CBC And Differential  . Comprehensive Metabolic Panel  . Lipid Panel  . Sedimentation Rate, Automated  .  C-Reactive Protein  . TSH+Free T4  . HIV-1/O/2, 4th Generation  . Ambulatory referral to Obstetrics / Gynecology  . Ambulatory referral to Dermatology      Subjective   Patient ID:  Jasmine Jackson is a 53 y.o. (DOB [birth year]) female    Patient presents with  . Annual Exam    Weight loss, discoloration of skin, need referral to OB/GYN.      HPI: Jasmine Jackson presents for a Medicare annual wellness visit and to complete an annual physical exam.  All histories were reviewed.  Jasmine Jackson is currently fasting.  Jasmine Jackson has the following chronic diagnoses that are managed by this clinic: Hypertension, obesity, acanthosis nigricans, and tobacco use. Jasmine Jackson has the following chronic diagnoses that are managed by outside clinics: Generalized anxiety disorder with panic attacks, PTSD, and obsessive-compulsive disorder. - Associated medications listed in [facility] chart: Yes - Jasmine Jackson confirms [facility] chart medications  as accurate: Yes - Jasmine Jackson states compliance with all medications listed in [facility] chart: Yes - Jasmine Jackson denies side effects on all medications listed in [facility] chart: Yes  Jasmine Jackson also has the following chronic concerns: - Significant fatigue.  - Leading to decreased exercise. - Unintentional weight loss and appetite loss.  - Has lost 15 pounds since last May. - Trouble sleeping. - Episodic thigh numbness. - Occasional left upper quadrant abdominal pain.  Current cigarette use: - Approximately 1/2 pack per day. - Not interested in discussing cessation today.  Jasmine Jackson would also like a referral to an OB/GYN to establish for well woman exam components.  Jasmine Jackson would also like a referral to dermatology for skin pigment changes at her bilateral medial thighs.    Reviewed and updated this visit by provider: Tobacco  Allergies  Meds  Problems  Med Hx  Surg Hx  Fam Hx        ROS: Review of Systems   Constitutional:  Positive for fatigue and unexpected weight change (loss). Negative for chills and fever.  HENT:  Negative for congestion, hearing loss, postnasal drip, rhinorrhea, sinus pressure, sinus pain and sore throat.   Eyes:  Negative for visual disturbance.  Respiratory:  Negative for cough and shortness of breath.   Cardiovascular:  Negative for chest pain and palpitations.  Gastrointestinal:  Positive for abdominal pain. Negative for constipation, diarrhea, nausea and vomiting.  Endocrine: Negative for polydipsia and polyuria.  Genitourinary:  Negative for dysuria and hematuria.  Musculoskeletal:  Negative for arthralgias and myalgias.  Skin:  Positive for color change (pigment at bilateral medial thighs). Negative for rash and wound.  Neurological:  Positive for numbness (at right thigh). Negative for dizziness, light-headedness and headaches.  Hematological:  Does not bruise/bleed easily.  Psychiatric/Behavioral:  Positive for sleep disturbance. Negative for confusion, decreased concentration, self-injury and suicidal ideas.      Objective   Vitals: BP 124/84 (BP Location: Left Upper Arm, Patient Position: Sitting)   Pulse 63   Temp 97.1 F (36.2 C) (Temporal)   Ht 5' 6 (1.676 m)   Wt 202 lb (91.6 kg)   BMI 32.60 kg/m   Physical Exam: Physical Exam Constitutional:      General: She is not in acute distress.    Appearance: Normal appearance. She is well-developed.  HENT:     Head: Normocephalic and atraumatic.  Eyes:     Conjunctiva/sclera: Conjunctivae normal.  Cardiovascular:     Rate and Rhythm: Normal rate and regular rhythm.     Heart sounds: S1 normal and S2 normal. Heart sounds not distant. No murmur heard.    No friction rub. No gallop. No S3 or S4 sounds.  Pulmonary:     Effort: Pulmonary effort is normal.     Breath sounds: Normal breath sounds.  Abdominal:     General: Bowel sounds are normal. There is no distension.     Palpations: Abdomen is  soft. Abdomen is not rigid.     Tenderness: There is abdominal tenderness in the right lower quadrant and left lower quadrant. There is no guarding.  Skin:    General: Skin is cool and dry.  Neurological:     Mental Status: She is alert.     Sensory: Sensation is intact.     Motor: Motor function is intact.     Coordination: Coordination is intact.     Gait: Gait is intact.  Psychiatric:  Attention and Perception: Attention and perception normal.        Mood and Affect: Mood and affect normal.        Speech: Speech normal.        Behavior: Behavior normal.     Comments: All psychiatric inferred from conversation.    Labs: Annual Physical on 08/25/2024  Component Date Value Ref Range Status  . TSH 08/25/2024 1.530  0.450 - 4.50 uIU/mL Final  . Free T4 08/25/2024 1.26  0.82 - 1.77 ng/dL Final  . HIV Ja/e75 Ag Screen 08/25/2024 Non Reactive  Non Reacti Final  . CRP 08/25/2024 9  0 - 10 mg/L Final  . Sed Rate 08/25/2024 9  0 - 40 mm/hr Final  . Cholesterol, Total 08/25/2024 208 (H)  100 - 199 mg/dL Final  . Triglycerides 08/25/2024 156 (H)  0 - 149 mg/dL Final  . HDL 90/96/7974 39 (L)  >39 mg/dL Final  . VLDL Cholesterol Cal 08/25/2024 28  5 - 40 mg/dL Final  . LDL 90/96/7974 141 (H)  0 - 99 mg/dL Final  . Glucose 90/96/7974 83  70 - 99 mg/dL Final  . BUN 90/96/7974 13  6 - 24 mg/dL Final  . Creatinine 90/96/7974 0.84  0.57 - 1.00 mg/dL Final  . eGFR 90/96/7974 83  >59 mL/min/1.73 Final  . BUN/Creatinine Ratio 08/25/2024 15  9 - 23 Final  . Sodium 08/25/2024 143  134 - 144 mmol/L Final  . Potassium 08/25/2024 4.3  3.5 - 5.2 mmol/L Final  . Chloride 08/25/2024 105  96 - 106 mmol/L Final  . CO2 08/25/2024 24  20 - 29 mmol/L Final  . CALCIUM 08/25/2024 9.8  8.7 - 10.2 mg/dL Final  . Total Protein 08/25/2024 7.1  6.0 - 8.5 g/dL Final  . Albumin, Serum 08/25/2024 4.4  3.8 - 4.9 g/dL Final  . Globulin, Total 08/25/2024 2.7  1.5 - 4.5 g/dL Final  . Total Bilirubin 08/25/2024  0.2  0.0 - 1.2 mg/dL Final  . Alkaline Phosphatase 08/25/2024 90  44 - 121 IU/L Final  . AST 08/25/2024 12  0 - 40 IU/L Final  . ALT (SGPT) 08/25/2024 9  0 - 32 IU/L Final  . WBC 08/25/2024 6.2  3.7 - 11.0 thou/mcL Final  . RBC 08/25/2024 4.84  4.01 - 4.90 million/mcL Final  . HGB 08/25/2024 14.1  12.2 - 14.9 gm/dL Final  . HCT 90/96/7974 42.2  35.8 - 47.9 % Final  . MCV 08/25/2024 87.2  82.0 - 98.0 fL Final  . MCH 08/25/2024 29.2  27.0 - 33.0 pg Final  . MCHC 08/25/2024 33.4  31.0 - 37.0 gm/dL Final  . Plt Ct 90/96/7974 402 (H)  150 - 400 thou/mcL Final  . RDW CV 08/25/2024 15.3 (H)  11.8 - 14.9 % Final  . NEUTROPHIL % 08/25/2024 48.3  % Final  . LYMPHOCYTE % 08/25/2024 44.8  % Final  . MID% 08/25/2024 6.9  % Final  . ABSOLUTE NEUTROPHIL COUNT 08/25/2024 3.00  1.50 - 7.50 thou/mcL Final  . ABSOLUTE LYMPHOCYTE COUNT 08/25/2024 2.80  1.00 - 4.50 thou/mcL Final  . ABSOLUTE MID 08/25/2024 <0.5  0.1 - 1.5 thou/mcL Final        Medicare AWV  Jasmine Jackson is a 53 y.o. female who presents for her subsequent annual wellness visit for Medicare.  Clinical documentation was reviewed and is accessible via encounter-level attachments.    Any physical exam components or additional concerns beyond the scope of the Annual Wellness Visit  may be documented in a separate note within this encounter.  Medicare Required Components     Reviewed and updated this visit by provider: Tobacco  Allergies  Meds  Problems  Med Hx  Surg Hx  Fam Hx       Medicare required assessment: Future risk of substance use disorder / overdose risk of 22% is higher (>=20%).    Patient Care Team: Alm FORBES Bilis, MD as PCP - General (Family Medicine) Gwendlyn Greg Buddy, MD as Consulting Physician (Gastroenterology) Almarie Hammersmith, NP (Obstetrics and Gynecology) Zane FORBES Bach, NP (Psychiatry)  Vitals   Vitals:   08/25/24 1031  BP: 124/84  Patient Position: Sitting  Pulse: 63  Temp: 97.1 F (36.2  C)  TempSrc: Temporal  Height: 5' 6 (1.676 m)  Weight: 202 lb (91.6 kg)  SpO2: Comment: unable to capture  BMI (Calculated): 32.6    Disposition   1. Medicare annual wellness visit, subsequent (Primary) -     Lipid Panel; Future 2. Primary hypertension -     CBC And Differential; Future; Expected date: 09/01/2024 -     Comprehensive Metabolic Panel; Future; Expected date: 09/01/2024 3. Class 1 obesity due to excess calories with serious comorbidity and body mass index (BMI) of 34.0 to 34.9 in adult 4. Acanthosis nigricans 5. Tobacco use disorder 6. Bipolar disorder in full remission, most recent episode unspecified type (*) 7. Generalized anxiety disorder with panic attacks 8. PTSD (post-traumatic stress disorder) 9. Obsessive-compulsive disorder, unspecified type 10. Well woman exam -     Ambulatory referral to Obstetrics / Gynecology 11. Unintentional weight loss -     CBC And Differential; Future; Expected date: 09/01/2024 -     Comprehensive Metabolic Panel; Future; Expected date: 09/01/2024 -     XR Chest Pa And Lateral; Future -     Sedimentation Rate, Automated; Future -     C-Reactive Protein; Future -     TSH+Free T4; Future -     HIV-1/O/2, 4th Generation; Future; Expected date: 09/01/2024 -     QuantiFERON-TB Gold Plus Blood; Future; Expected date: 09/01/2024 12. Meralgia paresthetica of right side 13. Color change in pigmented skin lesion -     Ambulatory referral to Dermatology   Follow up in about 6 months (around 02/22/2025) for Routine 6 month follow-up WITH DR. BILIS.   Health maintenance issues were discussed with the patient.  A written plan was provided to the patient in the form of patient instructions in the After Visit Summary document.     *Some images could not be shown.

## 2024-08-27 ENCOUNTER — Telehealth (HOSPITAL_COMMUNITY): Admitting: Student in an Organized Health Care Education/Training Program

## 2024-08-27 ENCOUNTER — Encounter (HOSPITAL_COMMUNITY): Payer: Self-pay | Admitting: Student in an Organized Health Care Education/Training Program

## 2024-08-27 DIAGNOSIS — G4700 Insomnia, unspecified: Secondary | ICD-10-CM

## 2024-08-27 DIAGNOSIS — Z636 Dependent relative needing care at home: Secondary | ICD-10-CM

## 2024-08-27 DIAGNOSIS — F411 Generalized anxiety disorder: Secondary | ICD-10-CM

## 2024-08-27 DIAGNOSIS — Z9189 Other specified personal risk factors, not elsewhere classified: Secondary | ICD-10-CM

## 2024-08-27 DIAGNOSIS — F39 Unspecified mood [affective] disorder: Secondary | ICD-10-CM

## 2024-08-27 DIAGNOSIS — F17200 Nicotine dependence, unspecified, uncomplicated: Secondary | ICD-10-CM

## 2024-08-27 MED ORDER — PROPRANOLOL HCL 20 MG PO TABS: 20.0000 mg | ORAL_TABLET | Freq: Every day | ORAL | 0 refills | Status: DC

## 2024-08-27 MED ORDER — LAMOTRIGINE 150 MG PO TABS: 150.0000 mg | ORAL_TABLET | Freq: Every day | ORAL | 0 refills | Status: DC

## 2024-08-27 MED ORDER — MIRTAZAPINE 30 MG PO TBDP: 30.0000 mg | ORAL_TABLET | Freq: Every day | ORAL | 0 refills | Status: DC

## 2024-08-27 MED ORDER — GABAPENTIN 300 MG PO CAPS: 300.0000 mg | ORAL_CAPSULE | Freq: Two times a day (BID) | ORAL | 0 refills | Status: DC

## 2024-08-27 MED ORDER — HYDROXYZINE HCL 50 MG PO TABS
50.0000 mg | ORAL_TABLET | Freq: Two times a day (BID) | ORAL | 0 refills | Status: DC | PRN
Start: 2024-08-27 — End: 2024-08-27

## 2024-08-27 MED ORDER — QUETIAPINE FUMARATE 400 MG PO TABS
600.0000 mg | ORAL_TABLET | Freq: Every day | ORAL | 2 refills | Status: DC
Start: 2024-08-27 — End: 2024-09-24

## 2024-08-27 MED ORDER — TRAZODONE HCL 100 MG PO TABS: 100.0000 mg | ORAL_TABLET | Freq: Every evening | ORAL | 0 refills | Status: DC | PRN

## 2024-08-27 NOTE — Telephone Encounter (Signed)
 Pt called re results and info was relayed.

## 2024-08-27 NOTE — Progress Notes (Signed)
 BH MD Outpatient Progress Note  08/27/2024 11:34 AM Jasmine Jackson  MRN:  [id number]  Virtual Visit via Telephone Note  I connected with Jasmine Jackson on 08/27/24 at 10 AM  by a video enabled telemedicine application and verified that I am speaking with the correct person using two identifiers.  Location: Patient: Home Provider: Office   I discussed the limitations, risks, security and privacy concerns of performing an evaluation and management service by telephone and the availability of in person appointments. I also discussed with the patient that there may be a patient responsible charge related to this service. The patient expressed understanding and agreed to proceed.   I discussed the assessment and treatment plan with the patient. The patient was provided an opportunity to ask questions and all were answered. The patient agreed with the plan and demonstrated an understanding of the instructions.   The patient was advised to call back or seek an in-person evaluation if the symptoms worsen or if the condition fails to improve as anticipated.   Assessment:  Jasmine Jackson presents for follow-up evaluation in-person on 08/27/24.  The primary focus of today's visit was to address polypharmacy and optimize the patient's medication regimen. The patient has reported some benefit from the recent titration of Seroquel  from 400 mg to 600 mg nightly. She remains on several QTc-prolonging agents, including hydroxyzine , trazodone , and Seroquel . Given the current Seroquel  dose, hydroxyzine  will be discontinued. Gabapentin  is being tapered from 300 mg three times daily to 300 mg twice daily, as the patient has reported memory issues at the current dose with the goal of discontinuing this medication completely. This adjustment is supported by evidence linking gabapentin  to cognitive side effects, including memory impairment, particularly at higher doses and with prolonged use. The patient is amenable  to these medication changes.  An EKG was previously ordered due to the risk of QTc prolongation; the patient is encouraged to follow up with her primary care provider or present to urgent care for completion of this test.  The patient continues to experience non-restorative sleep and is coping with significant psychosocial stressors, most notably the recent loss of the three children she had been caring for. Therapy was strongly encouraged as an added layer of support, especially in light of her recent losses. While she remains hesitant, she is open to further discussion about establishing outpatient therapy in the future.  Identifying Information: Jasmine Jackson is a 53 y.o. female with a history of unspecified mood disorder, PTSD, anxiety, panic attacks, and depression. who is an established patient with [facility]. The patient was previously being followed by Dr. Juleen for medication management, with the patient being a no show on appointment for 05/28/2024, last being seen in March virtually.  PMHx significant for HTN, GERD For a comprehensive history and detailed assessment, please refer to the initial adult assessment.  Plan:  # Bipolar I Disorder Status of problem: New to me Interventions: -- Continue Seroquel  600 mg nightly for improved mood stabilization -- Continue Lamotrigine  150 mg daily  # Insomnia # GAD Status of problem: New to me Interventions: -- Continue Trazodone  100 mg nightly PRN -- Continue Remeron  30 mg -- Decrease Gabapentin  300 mg TID to 300 mg BID to reduce unfavorable effects of reported poor memory  Plan to continue tapering off this medication -- STOP Atarax   -- Continue to encourage patient to complete EKG to recheck QTc, re-ordered at this visit  #Nicotine  Use Disorder - Status:  Active -Appears to be in contemplative stage of change -Continue to encourage smoking cessation, patient currently smoking 10 cigarettes  daily  Patient was given contact information for behavioral health clinic and was instructed to call 911 for emergencies.   Subjective:  Chief Complaint:  Chief Complaint  Patient presents with   Medication Refill   Medication Problem   Stress   Follow-up    Interval History:   The patient reports ongoing medical evaluation to determine the cause of her weight loss and is still in the process of completing laboratory testing. Since her Seroquel  dose was increased, she notes a decrease in sleep latency; however, she reports that her sleep quality remains unchanged and not restorative, averaging 4-5 hours of sleep nightly.  She denies caffeine use but continues to smoke, averaging about 10 cigarettes per day.  She denies any adverse effects from her current medications. The patient is amenable to tapering off gabapentin  and would like to begin this process. She also expresses interest in decreasing her dose of Atarax  at the next visit, though she continues to experience certain anxiety triggers.  The patient describes a complicated social situation in which she had been raising three of her cousin's ten children, having cared for them since they were infants and considering them as her own. Recently, all three children were removed from her home following a verbal altercation with their biological mother, who subsequently threatened to accuse the patient of kidnapping if she did not relinquish custody. The patient's distress centers on the loss of these children, reproting she feels a deep parental bond with them. She reports she is distressed by the recent discovery that one of the children was also removed from her cousin's home, and reports she is significantly affected by the fact that the biological mother is now refusing to allow her any contact or visitation with the children. The patient declined therapy at this time, preferring to rely on her existing support system to cope with this  loss.  She has been encouraged to continue with therapy, which she reports she has participated in since age 73. She feels that therapy sessions are predictable for her at this point. She has been leaning on her cousin for support, and they have been praying together.   Visit Diagnosis:    ICD-10-CM   1. Nicotine  use disorder  F17.200     2. Insomnia, unspecified type  G47.00 QUEtiapine  (SEROQUEL ) 400 MG tablet    traZODone  (DESYREL ) 100 MG tablet    gabapentin  (NEURONTIN ) 300 MG capsule    mirtazapine  (REMERON  SOL-TAB) 30 MG disintegrating tablet    3. Unspecified mood (affective) disorder (HCC)  F39 QUEtiapine  (SEROQUEL ) 400 MG tablet    4. GAD (generalized anxiety disorder)  F41.1 traZODone  (DESYREL ) 100 MG tablet    gabapentin  (NEURONTIN ) 300 MG capsule    lamoTRIgine  (LAMICTAL ) 150 MG tablet    propranolol  (INDERAL ) 20 MG tablet    mirtazapine  (REMERON  SOL-TAB) 30 MG disintegrating tablet    DISCONTINUED: hydrOXYzine  (ATARAX ) 50 MG tablet    5. Caregiver burden  Z63.6 propranolol  (INDERAL ) 20 MG tablet    DISCONTINUED: hydrOXYzine  (ATARAX ) 50 MG tablet    6. At risk for long QT syndrome  Z91.89 EKG 12-Lead       Past Psychiatric History:  08/27/2024-patient expressing interest in tapering off gabapentin , reduced at this visit from 300 mg 3 times daily to 300 mg twice daily. Atarax  stopped 02/2024 - patient's Seroquel  was being decreased   09/2022-concern  for extremely high dose of Seroquel  and adverse side effects due to this, decrease Seroquel  XR to 300 mg then 200 mg. Patient called later endorsing problems with sleep, started patient on trazodone  25 to 50 mg nightly as needed   02/2023-continue decrease of Seroquel .  Seroquel  decreased from 200 mg to 100 mg and continued Seroquel  XR 400 mg.  Patient endorses on arrival that she had been taking Seroquel  XR 400 and had decreased the regular Seroquel  to the previously mentioned 200.  Also concern that patient does not appear to  recall a true manic episode however patient was very nervous about titrating down his Seroquel .  Discussion had with patient about concern for polypharmacy and adverse metabolic side effects especially from being on Seroquel . 03/2023- Mood stable, continues to appear in remission.  Labs also indicated that patient was prediabetic and had hyperlipidemia, patient was okay with continuing down on Seroquel  after discussion that this medication could increase or worsen risk for these diseases. 07/2023- Patient rx of Seroquel  were old and patient was again taking higher doses and not the medication changes. Patient had to be decreased back to Seroquel  XR 600mg  at bedtime and Serqoeul 50mg  at bedtime. Continued Lamictal  and remeron  as they were. Patient had some stressors about her mother's health, but otherwise had a fair mood and no other anxiety outside of her mother's health.  08/2023- Patient endorsing more panic attacks, but not having to call EMS as in the past, started on Propanolol 10mg  daily PRN and 10mg  at bedtime as her anxiety is keeping her up at night. Patient trying to use coping skills as well. No other medication adjustments made. 10/2023-notes improvement in her anxiety, but continues to have anxiety disrupting her sleep and having poor concentration. Decrease Seroquel  from 600 to 400mg  and change Propanolol 10 BID PRN to 20mg  at bedtime. All other meds the same. 12/2023- Anxiety had some improvements, less panic attacks, but having some anxiety associated with insomnia. Discontinued 50mg  Seroquel .   Past Medical History: Chronic Health conditions: HTN, GERD, IBS, hiatal hernia Medications:  Fexmid  7.5 mg  Voltaren  gel Bentyl  10 mg  Hydrochlorothiazide  Linzess  Zofran  Compazine  suppository Carafate Chantix 1 mg daily  ALL: Aspirin  and bananas Seizures: Denies TBI: Denies  Social History: Lives with in [place] with her sister and 25 yo son The patient is unemployed, revieced SSI Marital  status: single Highest level of Education: completed HS, 1 year of college  Social History   Socioeconomic History   Marital status: Single    Spouse name: Not on file   Number of children: 1   Years of education: Not on file   Highest education level: Not on file  Occupational History   Occupation: Disability  Tobacco Use   Smoking status: Every Day    Current packs/day: 0.50    Average packs/day: 0.5 packs/day for 37.0 years (18.5 ttl pk-yrs)    Types: Cigarettes   Smokeless tobacco: Never   Tobacco comments:    10  CIG A DAY  Vaping Use   Vaping status: Never Used  Substance and Sexual Activity   Alcohol use: No    Alcohol/week: 0.0 standard drinks of alcohol   Drug use: No   Sexual activity: Yes    Birth control/protection: Condom    Comment: both  Other Topics Concern   Not on file  Social History Narrative   Not on file   Social Drivers of Health   Financial Resource Strain: Low Risk  (08/25/2024)  Received from [facility]   Overall Financial Resource Strain (CARDIA)    How hard is it for you to pay for the very basics like food, housing, medical care, and heating?: Not very hard  Food Insecurity: Food Insecurity Present (08/25/2024)   Received from [facility]   Hunger Vital Sign    Within the past 12 months, you worried that your food would run out before you got the money to buy more.: Sometimes true    Within the past 12 months, the food you bought just didn't last and you didn't have money to get more.: Sometimes true  Transportation Needs: No Transportation Needs (08/25/2024)   Received from [facility] - Transportation    In the past 12 months, has lack of transportation kept you from medical appointments or from getting medications?: No    In the past 12 months, has lack of transportation kept you from meetings, work, or from getting things needed for daily living?: No  Physical Activity: Insufficiently Active (08/25/2024)   Received from  [facility]   Exercise Vital Sign    On average, how many days per week do you engage in moderate to strenuous exercise (like a brisk walk)?: 3 days    On average, how many minutes do you engage in exercise at this level?: 40 min  Stress: Stress Concern Present (08/25/2024)   Received from [facility] - Occupational Stress Questionnaire    Do you feel stress - tense, restless, nervous, or anxious, or unable to sleep at night because your mind is troubled all the time - these days?: Very much  Social Connections: Somewhat Isolated (08/25/2024)   Received from [facility]   Social Network    How would you rate your social network (family, work, friends)?: Restricted participation with some degree of social isolation    Allergies:  Allergies  Allergen Reactions   Asa [Aspirin ] Other (See Comments)    Heart flutters   Banana Itching    Current Medications: Current Outpatient Medications  Medication Sig Dispense Refill   gabapentin  (NEURONTIN ) 300 MG capsule Take 1 capsule (300 mg total) by mouth 2 (two) times daily. 30 capsule 0   albuterol  (VENTOLIN  HFA) 108 (90 Base) MCG/ACT inhaler Inhale 2 puffs into the lungs every 6 (six) hours as needed for wheezing or shortness of breath. 3 each 0   Albuterol -Budesonide (AIRSUPRA ) 90-80 MCG/ACT AERO Inhale 2 puffs into the lungs every 4 (four) hours as needed (For coughing and wheezing.). 10.7 g 1   AMBULATORY NON FORMULARY MEDICATION Medication Name: GI Cocktail 45 ml 2% viscous lidocaine 45 ml bentyl  10mg /74ml 135 ml Mylanta SIG 5 ml bid prn gas and stomach pain-do not use more than 3 days in a row 300 mL 2   cetirizine  (ZYRTEC ) 10 MG tablet Take 1 tablet (10 mg total) by mouth daily as needed for allergies (Can take an extra dose during flare ups.). 180 tablet 1   cyclobenzaprine  (FEXMID ) 7.5 MG tablet TAKE 1 TABLET(7.5 MG) BY MOUTH THREE TIMES DAILY AS NEEDED FOR MUSCLE SPASMS 30 tablet 3    diclofenac  (VOLTAREN ) 75 MG EC tablet TAKE 1 TABLET(75 MG) BY MOUTH TWICE DAILY 180 tablet 0   dicyclomine  (BENTYL ) 10 MG capsule Take 1 capsule (10 mg total) by mouth in the morning, at noon, in the evening, and at bedtime. Please keep your upcoming appointment with Dr. Charlanne on 2-27 for any further refills. 120 capsule  0   EPINEPHrine  (EPIPEN  2-PAK) 0.3 mg/0.3 mL IJ SOAJ injection Inject 0.3 mg into the muscle as needed. 2 each 1   EPINEPHrine  (NEFFY ) 2 MG/0.1ML SOLN Place 2 sprays into the nose as needed. 2 each 2   hydrochlorothiazide  (HYDRODIURIL ) 25 MG tablet TAKE 1 TABLET(25 MG) BY MOUTH DAILY 90 tablet 1   lamoTRIgine  (LAMICTAL ) 150 MG tablet Take 1 tablet (150 mg total) by mouth daily. 90 tablet 0   linaclotide  (LINZESS ) 290 MCG CAPS capsule Take 1 capsule (290 mcg total) by mouth daily before breakfast. Please call [phone] to schedule an office visit for more refills 90 capsule 0   losartan (COZAAR) 25 MG tablet Take 25 mg by mouth daily.     mirtazapine  (REMERON  SOL-TAB) 30 MG disintegrating tablet Take 1 tablet (30 mg total) by mouth at bedtime. DISSOLVE 1 TABLET(15 MG) ON THE TONGUE AT BEDTIME 90 tablet 0   Olopatadine  HCl (PATADAY ) 0.2 % SOLN Place 1 drop into both eyes 1 day or 1 dose. 7.5 mL 1   ondansetron  (ZOFRAN -ODT) 8 MG disintegrating tablet Take 1 tablet (8 mg total) by mouth every 8 (eight) hours as needed for nausea or vomiting. 30 tablet 0   pantoprazole  (PROTONIX ) 40 MG tablet Take 1 tablet (40 mg total) by mouth 2 (two) times daily. 180 tablet 0   prochlorperazine  (COMPAZINE ) 25 MG suppository Use 1 suppository rectally every 6-8 hours as needed nausea/vomiting 12 suppository 3   propranolol  (INDERAL ) 20 MG tablet Take 1 tablet (20 mg total) by mouth at bedtime. 90 tablet 0   QUEtiapine  (SEROQUEL ) 400 MG tablet Take 1.5 tablets (600 mg total) by mouth at bedtime. 45 tablet 2   traZODone  (DESYREL ) 100 MG tablet Take 1 tablet (100 mg total) by mouth at bedtime as needed  for sleep. 90 tablet 0   varenicline (CHANTIX) 1 MG tablet Take 1 mg by mouth daily.     No current facility-administered medications for this visit.    ROS: Review of Systems  All other systems reviewed and are negative.   Objective:  Objective: Psychiatric Specialty Exam: General Appearance: Casual, fairly groomed  Eye Contact:  Good    Speech:  Clear, coherent, normal rate, spontaneous  Volume:  Normal   Mood:  see above  Affect:  Appropriate, congruent, full range  Thought Content: Logical, linear  Suicidal Thoughts: see subjective  Thought Process:  Coherent, goal-directed  Orientation:  A&Ox4   Memory:  Immediate good  Judgment:  Fair   Insight:  Fair  Concentration:  Attention and concentration good   Recall:  Good  Fund of Knowledge: Good  Language: Good, fluent  Psychomotor Activity: Normal  Akathisia:  NA   AIMS (if indicated): NA   Assets:   Communication Skills Desire for Improvement Housing Social Support  ADL's:  Intact  Cognition: WNL  Sleep: see above  Appetite: see above    Physical Exam Vitals and nursing note reviewed.  HENT:     Head: Normocephalic and atraumatic.  Eyes:     Conjunctiva/sclera: Conjunctivae normal.  Pulmonary:     Effort: Pulmonary effort is normal. No respiratory distress.  Musculoskeletal:        General: Normal range of motion.  Skin:    General: Skin is warm and dry.  Neurological:     General: No focal deficit present.      Metabolic Disorder Labs: Lab Results  Component Value Date   HGBA1C 5.8 (H) 04/02/2023   No results found  for: PROLACTIN Lab Results  Component Value Date   CHOL 198 04/02/2023   TRIG 194 (H) 04/02/2023   HDL 36 (L) 04/02/2023   CHOLHDL 5.5 (H) 04/02/2023   VLDL 29.8 04/24/2015   LDLCALC 127 (H) 04/02/2023   LDLCALC 89 04/24/2015   Lab Results  Component Value Date   TSH 1.750 04/02/2023   TSH 1.06 05/29/2021    Therapeutic Level Labs: No results found for:  LITHIUM No results found for: VALPROATE No results found for: CBMZ  Screenings:  GAD-7    Flowsheet Row Clinical Support from 06/22/2024 in [facility] Office Visit from 07/25/2022 in [facility] Video Visit from 01/16/2022 in [facility] Video Visit from 10/22/2021 in [facility] Video Visit from 07/16/2021 in [facility]  Total GAD-7 Score 15 4 2 4 18    PHQ2-9    Flowsheet Row Clinical Support from 06/22/2024 in [facility] Office Visit from 07/25/2022 in [facility] Office Visit from 07/18/2022 in [facility] [place]. [facility] Office Visit from 03/27/2022 in [facility] [place]. [facility] Video Visit from 01/16/2022 in [facility]  PHQ-2 Total Score 0 0 0 0 0  PHQ-9 Total Score -- 6 -- -- 1   Flowsheet Row ED from 05/06/2024 in [facility] Emergency Department at [facility] ED from 04/18/2024 in [facility] Emergency Department at [facility] ED from 09/22/2023 in [facility] Emergency Department at [facility]  C-SSRS RISK CATEGORY No Risk No Risk No Risk     Marlo Masson, MD 08/27/2024, 11:34 AM

## 2024-08-29 ENCOUNTER — Telehealth: Payer: Self-pay | Admitting: Allergy & Immunology

## 2024-08-29 NOTE — Telephone Encounter (Signed)
 Patient called reporting that she had pneumonia. I asked her about the diagnosis and she said that her PCP ordered the CXR and never contacted her about the results. She just got a call from the pharmacy reporting that she had two new antibiotics ready. Jasmine Jackson was rather stressed and concerned about this. I recommended that she start the antibiotics and call her PCP in the morning. She was talking quite fast and did not appear to be short of breath.   Marty Shaggy, MD [facility] 

## 2024-09-02 ENCOUNTER — Encounter: Admitting: Gastroenterology

## 2024-09-03 ENCOUNTER — Other Ambulatory Visit: Payer: Self-pay

## 2024-09-03 ENCOUNTER — Ambulatory Visit (INDEPENDENT_AMBULATORY_CARE_PROVIDER_SITE_OTHER): Admitting: Internal Medicine

## 2024-09-03 VITALS — BP 134/84 | HR 74 | Temp 97.9°F

## 2024-09-03 DIAGNOSIS — J4520 Mild intermittent asthma, uncomplicated: Secondary | ICD-10-CM

## 2024-09-03 DIAGNOSIS — J3089 Other allergic rhinitis: Secondary | ICD-10-CM

## 2024-09-03 MED ORDER — AIRSUPRA 90-80 MCG/ACT IN AERO: 2.0000 | INHALATION_SPRAY | RESPIRATORY_TRACT | 1 refills | Status: DC | PRN

## 2024-09-03 MED ORDER — CETIRIZINE HCL 10 MG PO TABS: 10.0000 mg | ORAL_TABLET | Freq: Every day | ORAL | 1 refills | Status: DC | PRN

## 2024-09-03 NOTE — Progress Notes (Signed)
   FOLLOW UP Date of Service/Encounter:  09/03/24   Subjective:  Jasmine Jackson (DOB: Jan 01, 1971) is a 53 y.o. female who [facility]24 for follow up for asthma, allergic rhinitis, PFAS, tobacco use.   History obtained from: chart review and patient. Last visit was with Dr Kozlow on 06/22/2024 and at the time, discussed use of PRN airsupra , zyrtec , pataday .    Reports having a productive cough and was seen by PCP who obtained a CXR with some findings concerning for pneumonia and was started on Augmentin  and Doxycycline .  She was wanting a second opinion on whether to take it or not.  Still smoking 10-15 cigs/day but working on cessation.  Not having much trouble with shortness of breath or wheezing and has only used Airsupra  a few times since last visit.  Denies any ER/urgent care/oral prednisone  use.  Does note some drainage/congestion, not using Zyrtec .    Past Medical History: Past Medical History:  Diagnosis Date   Anemia    06/19/21-pt has no recollection of this dx   Anxiety    Anxiety and depression    Asthma    Bipolar 1 disorder (HCC)    Depression    Excessive daytime sleepiness 11/18/2017   GERD (gastroesophageal reflux disease)    Heart murmur    Hemorrhoids    Hiatal hernia    Hypertension    Internal hemorrhoids    Irritable bowel disease 2014   Toe injury 11/18/2017    Objective:  BP 134/84 (BP Location: Left Arm, Patient Position: Sitting, Cuff Size: Normal)   Pulse 74   Temp 97.9 F (36.6 C) (Temporal)   SpO2 100%  There is no height or weight on file to calculate BMI. Physical Exam: GEN: alert, well developed HEENT: clear conjunctiva, nose with mild inferior turbinate hypertrophy, pink nasal mucosa, + clear rhinorrhea, + cobblestoning HEART: regular rate and rhythm, no murmur LUNGS: clear to auscultation bilaterally, no coughing, unlabored respiration SKIN: no rashes or lesions   Assessment:   1. Asthma, mild  intermittent, well-controlled   2. Other allergic rhinitis     Plan/Recommendations:   Okay to complete course of antibiotics for productive cough with abnormal CXR from PCP.  For uncontrolled rhinitis, discussed trying Zyrtec  as that can lead to post nasal drainage with cough.    1. Allergen avoidance measures - pollens, banana  2. Use nicotine  substitutes to replace tobacco smoke exposure  3. If needed:   A. Airsupra  - 2 inhalations every 4-6 hours (coupon)  B. Cetirizine  10 mg - 1 tablet 1 time per day  C. Pataday  - 1 drop each eye 1 time per day  D. Neffy , benadryl , MD/ER evaluation for allergic reaction    4. Further evaluation for Spring issues next year???  5. Keep follow up in January   Arleta Blanch, MD [facility]

## 2024-09-03 NOTE — Patient Instructions (Addendum)
 Okay to complete course of antibiotics for productive cough.    1. Allergen avoidance measures - pollens, banana  2. Use nicotine  substitutes to replace tobacco smoke exposure  3. If needed:   A. Airsupra  - 2 inhalations every 4-6 hours (coupon)  B. Cetirizine  10 mg - 1 tablet 1 time per day  C. Pataday  - 1 drop each eye 1 time per day  D. Neffy , benadryl , MD/ER evaluation for allergic reaction    4. Further evaluation for Spring issues next year???  5. Keep follow up in January

## 2024-09-20 ENCOUNTER — Other Ambulatory Visit: Payer: Self-pay

## 2024-09-24 ENCOUNTER — Telehealth (HOSPITAL_COMMUNITY): Admitting: Student in an Organized Health Care Education/Training Program

## 2024-09-24 DIAGNOSIS — Z636 Dependent relative needing care at home: Secondary | ICD-10-CM

## 2024-09-24 DIAGNOSIS — F39 Unspecified mood [affective] disorder: Secondary | ICD-10-CM

## 2024-09-24 DIAGNOSIS — G4700 Insomnia, unspecified: Secondary | ICD-10-CM

## 2024-09-24 DIAGNOSIS — F411 Generalized anxiety disorder: Secondary | ICD-10-CM

## 2024-09-24 MED ORDER — PROPRANOLOL HCL 20 MG PO TABS: 20.0000 mg | ORAL_TABLET | Freq: Every day | ORAL | 0 refills | Status: AC

## 2024-09-24 MED ORDER — LAMOTRIGINE 150 MG PO TABS: 150.0000 mg | ORAL_TABLET | Freq: Every day | ORAL | 0 refills | Status: DC

## 2024-09-24 MED ORDER — GABAPENTIN 300 MG PO CAPS: 300.0000 mg | ORAL_CAPSULE | Freq: Every day | ORAL | 0 refills | Status: DC

## 2024-09-24 MED ORDER — TRAZODONE HCL 100 MG PO TABS: 100.0000 mg | ORAL_TABLET | Freq: Every evening | ORAL | 0 refills | Status: DC | PRN

## 2024-09-24 MED ORDER — QUETIAPINE FUMARATE 400 MG PO TABS: 600.0000 mg | ORAL_TABLET | Freq: Every day | ORAL | 2 refills | Status: DC

## 2024-09-24 MED ORDER — MIRTAZAPINE 30 MG PO TBDP: 30.0000 mg | ORAL_TABLET | Freq: Every day | ORAL | 0 refills | Status: DC

## 2024-09-24 NOTE — Progress Notes (Signed)
 BH MD Outpatient Progress Note  09/24/2024 3:45 PM Jasmine Jackson  MRN:  [id number]  Virtual Visit via Telephone Note  I connected with Jasmine Jackson on 09/24/24 at 10 AM  by a video enabled telemedicine application and verified that I am speaking with the correct person using two identifiers.  Location: Patient: Home Provider: Office   I discussed the limitations, risks, security and privacy concerns of performing an evaluation and management service by telephone and the availability of in person appointments. I also discussed with the patient that there may be a patient responsible charge related to this service. The patient expressed understanding and agreed to proceed.   I discussed the assessment and treatment plan with the patient. The patient was provided an opportunity to ask questions and all were answered. The patient agreed with the plan and demonstrated an understanding of the instructions.   The patient was advised to call back or seek an in-person evaluation if the symptoms worsen or if the condition fails to improve as anticipated.   Assessment:  Jasmine Jackson presents for follow-up evaluation in-person on 09/24/24.  Since the last visit, the patient has continued with the gabapentin  taper. She reports subjective improvement in confusion and memory issues, which have been resolving as the dose has decreased. There are no significant symptoms of depression or anxiety at this appointment. Current medication regimen remains appropriate to continue at existing doses.   Identifying Information: Jasmine Jackson is a 53 y.o. female with a history of unspecified mood disorder, PTSD, anxiety, panic attacks, and depression. who is an established patient with [facility]. The patient was previously being followed by Dr. Juleen for medication management, with the patient being a no show on appointment for 05/28/2024, last being seen in March virtually.  PMHx  significant for HTN, GERD For a comprehensive history and detailed assessment, please refer to the initial adult assessment.  Plan:  # Bipolar I Disorder Status of problem: Stable Interventions: -- Continue Seroquel  600 mg nightly for improved mood stabilization -- Continue Lamotrigine  150 mg daily  # Insomnia # GAD Status of problem: Stabilizing Interventions: atarax  -- Continue Trazodone  100 mg nightly PRN -- Continue Remeron  30 mg -- Decrease Gabapentin  300 mg BID to 300 mg QD for 14 days then discontinue  Plan to continue tapering off this medication -- Continue to encourage patient to complete EKG to recheck QTc, re-ordered at this visit --CBT-i  #Nicotine  Use Disorder - Status: Active -Appears to be in contemplative stage of change -Continue to encourage smoking cessation, patient currently smoking 10 cigarettes daily  Patient was given contact information for behavioral health clinic and was instructed to call 911 for emergencies.   Subjective:  Chief Complaint:  Chief Complaint  Patient presents with   Follow-up    Interval History:  Patient reports improvement in memory and confusion since tapering medication and has not experienced any worsening of anxiety. She describes her mood as stable, with no current depressive symptoms. She remains undecided regarding her anxiety but does not report any acute concerns. She denies suicidal ideation.  Patient reports continued poor appetite, stating she is eating only once a month and continues to lose weight; she is following up with her primary care provider regarding this issue. She describes her sleep as suboptimal and was advised to stop smoking before bedtime. She reports no recent substance use and does not mention any adverse effects to medications. She states she is compliant with her medication  regimen and has not missed any doses. Caffeine use was not specifically addressed.   Visit Diagnosis:    ICD-10-CM   1.  Insomnia, unspecified type  G47.00 gabapentin  (NEURONTIN ) 300 MG capsule    mirtazapine  (REMERON  SOL-TAB) 30 MG disintegrating tablet    traZODone  (DESYREL ) 100 MG tablet    QUEtiapine  (SEROQUEL ) 400 MG tablet    2. GAD (generalized anxiety disorder)  F41.1 gabapentin  (NEURONTIN ) 300 MG capsule    mirtazapine  (REMERON  SOL-TAB) 30 MG disintegrating tablet    traZODone  (DESYREL ) 100 MG tablet    lamoTRIgine  (LAMICTAL ) 150 MG tablet    propranolol  (INDERAL ) 20 MG tablet    3. Unspecified mood (affective) disorder  F39 QUEtiapine  (SEROQUEL ) 400 MG tablet    4. Caregiver burden  Z63.6 propranolol  (INDERAL ) 20 MG tablet        Past Psychiatric History:  08/27/2024-patient expressing interest in tapering off gabapentin , reduced at this visit from 300 mg 3 times daily to 300 mg twice daily. Atarax  stopped 02/2024 - patient's Seroquel  was being decreased   09/2022-concern for extremely high dose of Seroquel  and adverse side effects due to this, decrease Seroquel  XR to 300 mg then 200 mg. Patient called later endorsing problems with sleep, started patient on trazodone  25 to 50 mg nightly as needed   02/2023-continue decrease of Seroquel .  Seroquel  decreased from 200 mg to 100 mg and continued Seroquel  XR 400 mg.  Patient endorses on arrival that she had been taking Seroquel  XR 400 and had decreased the regular Seroquel  to the previously mentioned 200.  Also concern that patient does not appear to recall a true manic episode however patient was very nervous about titrating down his Seroquel .  Discussion had with patient about concern for polypharmacy and adverse metabolic side effects especially from being on Seroquel . 03/2023- Mood stable, continues to appear in remission.  Labs also indicated that patient was prediabetic and had hyperlipidemia, patient was okay with continuing down on Seroquel  after discussion that this medication could increase or worsen risk for these diseases. 07/2023- Patient rx of  Seroquel  were old and patient was again taking higher doses and not the medication changes. Patient had to be decreased back to Seroquel  XR 600mg  at bedtime and Serqoeul 50mg  at bedtime. Continued Lamictal  and remeron  as they were. Patient had some stressors about her mother's health, but otherwise had a fair mood and no other anxiety outside of her mother's health.  08/2023- Patient endorsing more panic attacks, but not having to call EMS as in the past, started on Propanolol 10mg  daily PRN and 10mg  at bedtime as her anxiety is keeping her up at night. Patient trying to use coping skills as well. No other medication adjustments made. 10/2023-notes improvement in her anxiety, but continues to have anxiety disrupting her sleep and having poor concentration. Decrease Seroquel  from 600 to 400mg  and change Propanolol 10 BID PRN to 20mg  at bedtime. All other meds the same. 12/2023- Anxiety had some improvements, less panic attacks, but having some anxiety associated with insomnia. Discontinued 50mg  Seroquel .   Past Medical History: Chronic Health conditions: HTN, GERD, IBS, hiatal hernia Medications:  Fexmid  7.5 mg  Voltaren  gel Bentyl  10 mg  Hydrochlorothiazide  Linzess  Zofran  Compazine  suppository Carafate Chantix 1 mg daily  ALL: Aspirin  and bananas Seizures: Denies TBI: Denies  Social History: Lives with in [place] with her sister and 80 yo son The patient is unemployed, revieced SSI Marital status: single Highest level of Education: completed HS, 1 year of college  Social History   Socioeconomic History   Marital status: Single    Spouse name: Not on file   Number of children: 1   Years of education: Not on file   Highest education level: Not on file  Occupational History   Occupation: Disability  Tobacco Use   Smoking status: Every Day    Current packs/day: 0.50    Average packs/day: 0.5 packs/day for 37.0 years (18.5 ttl pk-yrs)    Types: Cigarettes   Smokeless tobacco: Never    Tobacco comments:    10  CIG A DAY  Vaping Use   Vaping status: Never Used  Substance and Sexual Activity   Alcohol use: No    Alcohol/week: 0.0 standard drinks of alcohol   Drug use: No   Sexual activity: Yes    Birth control/protection: Condom    Comment: both  Other Topics Concern   Not on file  Social History Narrative   Not on file   Social Drivers of Health   Financial Resource Strain: Low Risk  (08/25/2024)   Received from [facility]   Overall Financial Resource Strain (CARDIA)    How hard is it for you to pay for the very basics like food, housing, medical care, and heating?: Not very hard  Food Insecurity: Food Insecurity Present (08/25/2024)   Received from [facility]   Hunger Vital Sign    Within the past 12 months, you worried that your food would run out before you got the money to buy more.: Sometimes true    Within the past 12 months, the food you bought just didn't last and you didn't have money to get more.: Sometimes true  Transportation Needs: No Transportation Needs (08/25/2024)   Received from [facility] - Transportation    In the past 12 months, has lack of transportation kept you from medical appointments or from getting medications?: No    In the past 12 months, has lack of transportation kept you from meetings, work, or from getting things needed for daily living?: No  Physical Activity: Insufficiently Active (08/25/2024)   Received from [facility]   Exercise Vital Sign    On average, how many days per week do you engage in moderate to strenuous exercise (like a brisk walk)?: 3 days    On average, how many minutes do you engage in exercise at this level?: 40 min  Stress: Stress Concern Present (08/25/2024)   Received from [facility] - Occupational Stress Questionnaire    Do you feel stress - tense, restless, nervous, or anxious, or unable to sleep at night because your mind is troubled all the  time - these days?: Very much  Social Connections: Somewhat Isolated (08/25/2024)   Received from [facility]   Social Network    How would you rate your social network (family, work, friends)?: Restricted participation with some degree of social isolation    Allergies:  Allergies  Allergen Reactions   Asa [Aspirin ] Other (See Comments)    Heart flutters   Banana Itching    Current Medications: Current Outpatient Medications  Medication Sig Dispense Refill   albuterol  (VENTOLIN  HFA) 108 (90 Base) MCG/ACT inhaler Inhale 2 puffs into the lungs every 6 (six) hours as needed for wheezing or shortness of breath. 3 each 0   Albuterol -Budesonide (AIRSUPRA ) 90-80 MCG/ACT AERO Inhale 2 puffs into the lungs every 4 (four) hours as needed (For coughing and wheezing.). 10.7 g  1   AMBULATORY NON FORMULARY MEDICATION Medication Name: GI Cocktail 45 ml 2% viscous lidocaine 45 ml bentyl  10mg /96ml 135 ml Mylanta SIG 5 ml bid prn gas and stomach pain-do not use more than 3 days in a row 300 mL 2   cetirizine  (ZYRTEC ) 10 MG tablet Take 1 tablet (10 mg total) by mouth daily as needed for allergies (Can take an extra dose during flare ups.). 180 tablet 1   cyclobenzaprine  (FEXMID ) 7.5 MG tablet TAKE 1 TABLET(7.5 MG) BY MOUTH THREE TIMES DAILY AS NEEDED FOR MUSCLE SPASMS 30 tablet 3   diclofenac  (VOLTAREN ) 75 MG EC tablet TAKE 1 TABLET(75 MG) BY MOUTH TWICE DAILY 180 tablet 0   dicyclomine  (BENTYL ) 10 MG capsule Take 1 capsule (10 mg total) by mouth in the morning, at noon, in the evening, and at bedtime. Please keep your upcoming appointment with Dr. Charlanne on 2-27 for any further refills. 120 capsule 0   EPINEPHrine  (EPIPEN  2-PAK) 0.3 mg/0.3 mL IJ SOAJ injection Inject 0.3 mg into the muscle as needed. (Patient not taking: Reported on 09/03/2024) 2 each 1   EPINEPHrine  (NEFFY ) 2 MG/0.1ML SOLN Place 2 sprays into the nose as needed. (Patient not taking: Reported on 09/03/2024) 2 each 2   gabapentin  (NEURONTIN )  300 MG capsule Take 1 capsule (300 mg total) by mouth daily. 14 capsule 0   hydrochlorothiazide  (HYDRODIURIL ) 25 MG tablet TAKE 1 TABLET(25 MG) BY MOUTH DAILY 90 tablet 1   lamoTRIgine  (LAMICTAL ) 150 MG tablet Take 1 tablet (150 mg total) by mouth daily. 90 tablet 0   linaclotide  (LINZESS ) 290 MCG CAPS capsule Take 1 capsule (290 mcg total) by mouth daily before breakfast. Please call [phone] to schedule an office visit for more refills 90 capsule 0   losartan (COZAAR) 25 MG tablet Take 25 mg by mouth daily.     mirtazapine  (REMERON  SOL-TAB) 30 MG disintegrating tablet Take 1 tablet (30 mg total) by mouth at bedtime. DISSOLVE 1 TABLET(15 MG) ON THE TONGUE AT BEDTIME 90 tablet 0   Olopatadine  HCl (PATADAY ) 0.2 % SOLN Place 1 drop into both eyes 1 day or 1 dose. 7.5 mL 1   ondansetron  (ZOFRAN -ODT) 8 MG disintegrating tablet Take 1 tablet (8 mg total) by mouth every 8 (eight) hours as needed for nausea or vomiting. 30 tablet 0   pantoprazole  (PROTONIX ) 40 MG tablet Take 1 tablet (40 mg total) by mouth 2 (two) times daily. 180 tablet 0   prochlorperazine  (COMPAZINE ) 25 MG suppository Use 1 suppository rectally every 6-8 hours as needed nausea/vomiting 12 suppository 3   propranolol  (INDERAL ) 20 MG tablet Take 1 tablet (20 mg total) by mouth at bedtime. 90 tablet 0   QUEtiapine  (SEROQUEL ) 400 MG tablet Take 1.5 tablets (600 mg total) by mouth at bedtime. 45 tablet 2   traZODone  (DESYREL ) 100 MG tablet Take 1 tablet (100 mg total) by mouth at bedtime as needed for sleep. 90 tablet 0   varenicline (CHANTIX) 1 MG tablet Take 1 mg by mouth daily.     No current facility-administered medications for this visit.    ROS: Review of Systems  All other systems reviewed and are negative.   Objective:  Objective: Psychiatric Specialty Exam: General Appearance: Casual, fairly groomed  Eye Contact:  Good    Speech:  Clear, coherent, normal rate, spontaneous  Volume:  Normal   Mood:  see above   Affect:  Appropriate, congruent, full range  Thought Content: Logical, linear  Suicidal Thoughts: see subjective  Thought Process:  Coherent, goal-directed  Orientation:  A&Ox4   Memory:  Immediate good  Judgment:  Fair   Insight:  Fair  Concentration:  Attention and concentration good   Recall:  Good  Fund of Knowledge: Good  Language: Good, fluent  Psychomotor Activity: Normal  Akathisia:  NA   AIMS (if indicated): NA   Assets:   Communication Skills Desire for Improvement Housing Social Support  ADL's:  Intact  Cognition: WNL  Sleep: see above  Appetite: see above    Physical Exam Vitals and nursing note reviewed.  HENT:     Head: Normocephalic and atraumatic.  Eyes:     Conjunctiva/sclera: Conjunctivae normal.  Pulmonary:     Effort: Pulmonary effort is normal. No respiratory distress.  Musculoskeletal:        General: Normal range of motion.  Skin:    General: Skin is warm and dry.  Neurological:     General: No focal deficit present.      Metabolic Disorder Labs: Lab Results  Component Value Date   HGBA1C 5.8 (H) 04/02/2023   No results found for: PROLACTIN Lab Results  Component Value Date   CHOL 198 04/02/2023   TRIG 194 (H) 04/02/2023   HDL 36 (L) 04/02/2023   CHOLHDL 5.5 (H) 04/02/2023   VLDL 29.8 04/24/2015   LDLCALC 127 (H) 04/02/2023   LDLCALC 89 04/24/2015   Lab Results  Component Value Date   TSH 1.750 04/02/2023   TSH 1.06 05/29/2021    Therapeutic Level Labs: No results found for: LITHIUM No results found for: VALPROATE No results found for: CBMZ  Screenings:  GAD-7    Flowsheet Row Clinical Support from 06/22/2024 in [facility] Office Visit from 07/25/2022 in [facility] Video Visit from 01/16/2022 in [facility] Video Visit from 10/22/2021 in [facility] Video Visit from 07/16/2021 in [facility]  Total GAD-7 Score 15 4 2 4 18    PHQ2-9    Flowsheet Row Clinical Support from 06/22/2024 in [facility] Office Visit from 07/25/2022 in [facility] Office Visit from 07/18/2022 in [facility] [place] Office Visit from 03/27/2022 in [facility] Video Visit from 01/16/2022 in [facility]  PHQ-2 Total Score 0 0 0 0 0  PHQ-9 Total Score -- 6 -- -- 1   Flowsheet Row ED from 05/06/2024 in [facility] Emergency Department at [facility] ED from 04/18/2024 in [facility] Emergency Department at [facility] ED from 09/22/2023 in [facility] Emergency Department at [facility]  C-SSRS RISK CATEGORY No Risk No Risk No Risk     Marlo Masson, MD 09/24/2024, 3:45 PM

## 2024-10-15 ENCOUNTER — Encounter: Admitting: Gastroenterology

## 2024-10-20 ENCOUNTER — Encounter: Admitting: Gastroenterology

## 2024-11-26 ENCOUNTER — Telehealth (HOSPITAL_COMMUNITY): Payer: Self-pay | Admitting: Student in an Organized Health Care Education/Training Program

## 2024-11-26 DIAGNOSIS — G4700 Insomnia, unspecified: Secondary | ICD-10-CM

## 2024-11-26 DIAGNOSIS — F39 Unspecified mood [affective] disorder: Secondary | ICD-10-CM

## 2024-11-26 DIAGNOSIS — F411 Generalized anxiety disorder: Secondary | ICD-10-CM

## 2024-11-26 MED ORDER — MIRTAZAPINE 30 MG PO TBDP: 30.0000 mg | ORAL_TABLET | Freq: Every day | ORAL | 0 refills | Status: AC

## 2024-11-26 MED ORDER — LAMOTRIGINE 150 MG PO TABS: 150.0000 mg | ORAL_TABLET | Freq: Every day | ORAL | 0 refills | Status: AC

## 2024-11-26 MED ORDER — TRAZODONE HCL 100 MG PO TABS: 100.0000 mg | ORAL_TABLET | Freq: Every evening | ORAL | 0 refills | Status: AC | PRN

## 2024-11-26 MED ORDER — QUETIAPINE FUMARATE 400 MG PO TABS: 600.0000 mg | ORAL_TABLET | Freq: Every day | ORAL | 0 refills | Status: AC

## 2024-11-26 NOTE — Telephone Encounter (Signed)
 Refill of patient's medications sent for 20 days to bridge until follow up appointment on 12/10/2024.  - Lamictal  150 mg daily, 20 tablets, 0 refills - Remeron  30 mg nightly, 20 tablets, 0 refills - Seroquel  400 mg nightly, 20 tablets, 0 refills - Trazodone  100 mg nightly, 20 tablets, 0 refills   Mendi Constable Carrin Carrero, MD PGY-3, [facility]

## 2024-12-10 ENCOUNTER — Telehealth (INDEPENDENT_AMBULATORY_CARE_PROVIDER_SITE_OTHER): Admitting: Student in an Organized Health Care Education/Training Program

## 2024-12-10 ENCOUNTER — Ambulatory Visit: Admitting: Gastroenterology

## 2024-12-10 VITALS — BP 114/80 | HR 84 | Ht 66.0 in | Wt 194.2 lb

## 2024-12-10 DIAGNOSIS — G4700 Insomnia, unspecified: Secondary | ICD-10-CM

## 2024-12-10 DIAGNOSIS — F319 Bipolar disorder, unspecified: Secondary | ICD-10-CM | POA: Diagnosis not present

## 2024-12-10 DIAGNOSIS — K642 Third degree hemorrhoids: Secondary | ICD-10-CM

## 2024-12-10 DIAGNOSIS — K581 Irritable bowel syndrome with constipation: Secondary | ICD-10-CM

## 2024-12-10 DIAGNOSIS — F39 Unspecified mood [affective] disorder: Secondary | ICD-10-CM

## 2024-12-10 DIAGNOSIS — F17200 Nicotine dependence, unspecified, uncomplicated: Secondary | ICD-10-CM | POA: Diagnosis not present

## 2024-12-10 DIAGNOSIS — F1091 Alcohol use, unspecified, in remission: Secondary | ICD-10-CM | POA: Diagnosis not present

## 2024-12-10 DIAGNOSIS — F411 Generalized anxiety disorder: Secondary | ICD-10-CM | POA: Diagnosis not present

## 2024-12-10 DIAGNOSIS — Z79899 Other long term (current) drug therapy: Secondary | ICD-10-CM

## 2024-12-10 MED ORDER — QUETIAPINE FUMARATE 400 MG PO TABS: 600.0000 mg | ORAL_TABLET | Freq: Every day | ORAL | 0 refills | Status: AC

## 2024-12-10 MED ORDER — MIRTAZAPINE 30 MG PO TBDP: 30.0000 mg | ORAL_TABLET | Freq: Every day | ORAL | 0 refills | Status: AC

## 2024-12-10 MED ORDER — LAMOTRIGINE 150 MG PO TABS: 150.0000 mg | ORAL_TABLET | Freq: Every day | ORAL | 0 refills | Status: AC

## 2024-12-10 NOTE — Progress Notes (Signed)
 BH MD Outpatient Progress Note  12/10/2024 1:46 PM Jasmine Jackson  MRN:  [id number]  Virtual Visit via Telephone Note  I connected with Jasmine Jackson on 12/10/2024 at 10 AM  by a video enabled telemedicine application and verified that I am speaking with the correct person using two identifiers.  Location: Patient: Home Provider: Office   I discussed the limitations, risks, security and privacy concerns of performing an evaluation and management service by telephone and the availability of in person appointments. I also discussed with the patient that there may be a patient responsible charge related to this service. The patient expressed understanding and agreed to proceed.   I discussed the assessment and treatment plan with the patient. The patient was provided an opportunity to ask questions and all were answered. The patient agreed with the plan and demonstrated an understanding of the instructions.   The patient was advised to call back or seek an in-person evaluation if the symptoms worsen or if the condition fails to improve as anticipated.   Assessment:  Jasmine Jackson presents for follow-up evaluation in-person on 12/10/2024.   Patient seen in follow-up today. She reports significant emotional distress related to an ongoing workup for unintentional weight loss. There is understandable anxiety given her family history of cancer, and she endorses decreased sleep, currently sleeping approximately 3 hours per night. I considered the possibility of emerging hypomania/mania; however, there is no clinical evidence of mood cycling or acute mood disturbance at this time. This again raises question regarding the accuracy of her historical diagnosis of bipolar disorder. Overall, she presents psychiatrically stable, with distress attributable to a clear external stressor.   Identifying Information: Jasmine Jackson is a 53 y.o. female with a history of unspecified mood disorder, PTSD,  anxiety, panic attacks, and depression. who is an established patient with [facility]. The patient was previously being followed by Dr. Juleen for medication management, with the patient being a no show on appointment for 05/28/2024, last being seen in March virtually.  PMHx significant for HTN, GERD For a comprehensive history and detailed assessment, please refer to the initial adult assessment.  Plan:  # Bipolar I Disorder Status of problem: Stable Interventions: -- Continue Seroquel  600 mg nightly for improved mood stabilization -- Continue Lamotrigine  150 mg daily  # Insomnia # GAD Medication trials: gabapentin  (confusion/memory issues) Status of problem: Stable Interventions: atarax  -- Continue Trazodone  100 mg nightly PRN -- Continue Remeron  30 mg -- EKG scheduled on 12/22 -- CBT-i  #Nicotine  Use Disorder - Status: Active - Appears to be in contemplative stage of change - Continue to encourage smoking cessation, patient currently smoking 10 cigarettes daily - Advised to stop smoking 2 hours before bedtime   #Alcohol use disorder in susatined remission -stable, remains in remission   Health Maintenance: PCP: Alm FORBES Bilis, MD  OBGYN: Laneta DEL [place], MD   Patient was given contact information for behavioral health clinic and was instructed to call 911 for emergencies.   Subjective:  Chief Complaint:  Chief Complaint  Patient presents with   Follow-up   Stress    Interval History:  Patient reports she has identified a therapist in [place] and plans to establish care. She describes significant unintentional weight loss of approximately 28 pounds, states she is following with her PCP for further evaluation, and reports finding the weight loss distressing. She reports no symptoms consistent with mania and denies expansive mood or increased energy. She describes ongoing psychosocial  stress related to her sons ruptured appendix on  9/21, states he required a drain with an external collection bag, and reports continued complications that have been stressful for her. She reports poor sleep with approximately three hours of sleep per night despite current medications. She states she has experienced no adverse effects from medications and reports taking medications as directed. She reports daily tobacco use of approximately 15 cigarettes and states she was counseled to stop smoking at least two hours before bedtime. She denies alcohol use and denies cannabis use. She denies suicidal ideation, denies homicidal ideation, and denies auditory or visual hallucinations.     Visit Diagnosis:    ICD-10-CM   1. GAD (generalized anxiety disorder)  F41.1 lamoTRIgine  (LAMICTAL ) 150 MG tablet    mirtazapine  (REMERON  SOL-TAB) 30 MG disintegrating tablet    2. Insomnia, unspecified type  G47.00 QUEtiapine  (SEROQUEL ) 400 MG tablet    mirtazapine  (REMERON  SOL-TAB) 30 MG disintegrating tablet    3. Unspecified mood (affective) disorder  F39 QUEtiapine  (SEROQUEL ) 400 MG tablet         Past Psychiatric History:  08/27/2024-patient expressing interest in tapering off gabapentin , reduced at this visit from 300 mg 3 times daily to 300 mg twice daily. Atarax  stopped 02/2024 - patient's Seroquel  was being decreased   09/2022-concern for extremely high dose of Seroquel  and adverse side effects due to this, decrease Seroquel  XR to 300 mg then 200 mg. Patient called later endorsing problems with sleep, started patient on trazodone  25 to 50 mg nightly as needed   02/2023-continue decrease of Seroquel .  Seroquel  decreased from 200 mg to 100 mg and continued Seroquel  XR 400 mg.  Patient endorses on arrival that she had been taking Seroquel  XR 400 and had decreased the regular Seroquel  to the previously mentioned 200.  Also concern that patient does not appear to recall a true manic episode however patient was very nervous about titrating down his Seroquel .   Discussion had with patient about concern for polypharmacy and adverse metabolic side effects especially from being on Seroquel . 03/2023- Mood stable, continues to appear in remission.  Labs also indicated that patient was prediabetic and had hyperlipidemia, patient was okay with continuing down on Seroquel  after discussion that this medication could increase or worsen risk for these diseases. 07/2023- Patient rx of Seroquel  were old and patient was again taking higher doses and not the medication changes. Patient had to be decreased back to Seroquel  XR 600mg  at bedtime and Serqoeul 50mg  at bedtime. Continued Lamictal  and remeron  as they were. Patient had some stressors about her mother's health, but otherwise had a fair mood and no other anxiety outside of her mother's health.  08/2023- Patient endorsing more panic attacks, but not having to call EMS as in the past, started on Propanolol 10mg  daily PRN and 10mg  at bedtime as her anxiety is keeping her up at night. Patient trying to use coping skills as well. No other medication adjustments made. 10/2023-notes improvement in her anxiety, but continues to have anxiety disrupting her sleep and having poor concentration. Decrease Seroquel  from 600 to 400mg  and change Propanolol 10 BID PRN to 20mg  at bedtime. All other meds the same. 12/2023- Anxiety had some improvements, less panic attacks, but having some anxiety associated with insomnia. Discontinued 50mg  Seroquel .   Past Medical History: Chronic Health conditions: HTN, GERD, IBS, hiatal hernia Medications:  Fexmid  7.5 mg  Voltaren  gel Bentyl  10 mg  Hydrochlorothiazide  Linzess  Zofran  Compazine  suppository Carafate Chantix 1 mg daily  ALL: Aspirin  and bananas Seizures: Denies TBI: Denies  Social History: Lives with in [place] with her sister and 31 yo son The patient is unemployed, revieced SSI Marital status: single Highest level of Education: completed HS, 1 year of college  Social History    Socioeconomic History   Marital status: Single    Spouse name: Not on file   Number of children: 1   Years of education: Not on file   Highest education level: Not on file  Occupational History   Occupation: Disability  Tobacco Use   Smoking status: Every Day    Current packs/day: 0.50    Average packs/day: 0.5 packs/day for 37.0 years (18.5 ttl pk-yrs)    Types: Cigarettes   Smokeless tobacco: Never   Tobacco comments:    10  CIG A DAY  Vaping Use   Vaping status: Never Used  Substance and Sexual Activity   Alcohol use: No    Alcohol/week: 0.0 standard drinks of alcohol   Drug use: No   Sexual activity: Yes    Birth control/protection: Condom    Comment: both  Other Topics Concern   Not on file  Social History Narrative   Not on file   Social Drivers of Health   Tobacco Use: High Risk (11/30/2024)   Received from [facility]   Patient History    Smoking Tobacco Use: Every Day    Smokeless Tobacco Use: Never    Passive Exposure: Current  Financial Resource Strain: Medium Risk (10/23/2024)   Received from [facility]   Overall Financial Resource Strain (CARDIA)    How hard is it for you to pay for the very basics like food, housing, medical care, and heating?: Somewhat hard  Food Insecurity: Food Insecurity Present (10/23/2024)   Received from [facility]   Epic    Within the past 12 months, you worried that your food would run out before you got the money to buy more.: Sometimes true    Within the past 12 months, the food you bought just didn't last and you didn't have money to get more.: Sometimes true  Transportation Needs: Unmet Transportation Needs (10/23/2024)   Received from [facility]    In the past 12 months, has lack of transportation kept you from medical appointments or from getting medications?: No    In the past 12 months, has lack of transportation kept you from meetings, work, or from getting things needed for daily living?: Yes   Physical Activity: Unknown (10/23/2024)   Received from [facility]   Exercise Vital Sign    On average, how many days per week do you engage in moderate to strenuous exercise (like a brisk walk)?: 3 days    Minutes of Exercise per Session: Not on file  Recent Concern: Physical Activity - Insufficiently Active (08/25/2024)   Received from [facility]   Exercise Vital Sign    On average, how many days per week do you engage in moderate to strenuous exercise (like a brisk walk)?: 3 days    On average, how many minutes do you engage in exercise at this level?: 40 min  Stress: Stress Concern Present (10/23/2024)   Received from [facility] - Occupational Stress Questionnaire    Do you feel stress - tense, restless, nervous, or anxious, or unable to sleep at night because your mind is troubled all the time - these days?: Very much  Social Connections: Somewhat  Isolated (10/23/2024)   Received from [facility]   Social Network    How would you rate your social network (family, work, friends)?: Restricted participation with some degree of social isolation  Depression (PHQ2-9): Low Risk (06/22/2024)   Depression (PHQ2-9)    PHQ-2 Score: 0  Alcohol Screen: Not on file  Housing: Low Risk (10/23/2024)   Received from [facility]    In the last 12 months, was there a time when you were not able to pay the mortgage or rent on time?: No    In the past 12 months, how many times have you moved where you were living?: 0    At any time in the past 12 months, were you homeless or living in a shelter (including now)?: No  Utilities: At Risk (10/23/2024)   Received from [facility]    In the past 12 months has the electric, gas, oil, or water company threatened to shut off services in your home?: Yes  Health Literacy: Not on file    Allergies:  Allergies  Allergen Reactions   Asa [Aspirin ] Other (See Comments)    Heart flutters   Banana  Itching    Current Medications: Current Outpatient Medications  Medication Sig Dispense Refill   albuterol  (VENTOLIN  HFA) 108 (90 Base) MCG/ACT inhaler Inhale 2 puffs into the lungs every 6 (six) hours as needed for wheezing or shortness of breath. 3 each 0   Albuterol -Budesonide (AIRSUPRA ) 90-80 MCG/ACT AERO Inhale 2 puffs into the lungs every 4 (four) hours as needed (For coughing and wheezing.). 10.7 g 1   AMBULATORY NON FORMULARY MEDICATION Medication Name: GI Cocktail 45 ml 2% viscous lidocaine 45 ml bentyl  10mg /3ml 135 ml Mylanta SIG 5 ml bid prn gas and stomach pain-do not use more than 3 days in a row 300 mL 2   cetirizine  (ZYRTEC ) 10 MG tablet Take 1 tablet (10 mg total) by mouth daily as needed for allergies (Can take an extra dose during flare ups.). 180 tablet 1   cyclobenzaprine  (FEXMID ) 7.5 MG tablet TAKE 1 TABLET(7.5 MG) BY MOUTH THREE TIMES DAILY AS NEEDED FOR MUSCLE SPASMS 30 tablet 3   diclofenac  (VOLTAREN ) 75 MG EC tablet TAKE 1 TABLET(75 MG) BY MOUTH TWICE DAILY 180 tablet 0   dicyclomine  (BENTYL ) 10 MG capsule Take 1 capsule (10 mg total) by mouth in the morning, at noon, in the evening, and at bedtime. Please keep your upcoming appointment with Dr. Charlanne on 2-27 for any further refills. 120 capsule 0   EPINEPHrine  (EPIPEN  2-PAK) 0.3 mg/0.3 mL IJ SOAJ injection Inject 0.3 mg into the muscle as needed. (Patient not taking: Reported on 09/03/2024) 2 each 1   EPINEPHrine  (NEFFY ) 2 MG/0.1ML SOLN Place 2 sprays into the nose as needed. (Patient not taking: Reported on 09/03/2024) 2 each 2   hydrochlorothiazide  (HYDRODIURIL ) 25 MG tablet TAKE 1 TABLET(25 MG) BY MOUTH DAILY 90 tablet 1   lamoTRIgine  (LAMICTAL ) 150 MG tablet Take 1 tablet (150 mg total) by mouth daily. 20 tablet 0   linaclotide  (LINZESS ) 290 MCG CAPS capsule Take 1 capsule (290 mcg total) by mouth daily before breakfast. Please call [phone] to schedule an office visit for more refills 90 capsule 0   losartan  (COZAAR) 25 MG tablet Take 25 mg by mouth daily.     mirtazapine  (REMERON  SOL-TAB) 30 MG disintegrating tablet Take 1 tablet (30 mg total) by mouth at bedtime. DISSOLVE 1 TABLET(15 MG) ON THE TONGUE  AT BEDTIME 90 tablet 0   Olopatadine  HCl (PATADAY ) 0.2 % SOLN Place 1 drop into both eyes 1 day or 1 dose. 7.5 mL 1   ondansetron  (ZOFRAN -ODT) 8 MG disintegrating tablet Take 1 tablet (8 mg total) by mouth every 8 (eight) hours as needed for nausea or vomiting. 30 tablet 0   pantoprazole  (PROTONIX ) 40 MG tablet Take 1 tablet (40 mg total) by mouth 2 (two) times daily. 180 tablet 0   prochlorperazine  (COMPAZINE ) 25 MG suppository Use 1 suppository rectally every 6-8 hours as needed nausea/vomiting 12 suppository 3   propranolol  (INDERAL ) 20 MG tablet Take 1 tablet (20 mg total) by mouth at bedtime. 90 tablet 0   QUEtiapine  (SEROQUEL ) 400 MG tablet Take 1.5 tablets (600 mg total) by mouth at bedtime. 135 tablet 0   traZODone  (DESYREL ) 100 MG tablet Take 1 tablet (100 mg total) by mouth at bedtime as needed for up to 20 days for sleep. 20 tablet 0   varenicline (CHANTIX) 1 MG tablet Take 1 mg by mouth daily.     No current facility-administered medications for this visit.    ROS: Review of Systems  All other systems reviewed and are negative.   Objective:  Objective: Psychiatric Specialty Exam: General Appearance: Casual, fairly groomed  Eye Contact:  Good    Speech:  Clear, coherent, normal rate, spontaneous  Volume:  Normal   Mood:  see above  Affect:  Appropriate, congruent, full range  Thought Content: Logical, linear  Suicidal Thoughts: see subjective  Thought Process:  Coherent, goal-directed  Orientation:  A&Ox4   Memory:  Immediate good  Judgment:  Fair   Insight:  Fair  Concentration:  Attention and concentration good   Recall:  Good  Fund of Knowledge: Good  Language: Good, fluent  Psychomotor Activity: Normal  Akathisia:  NA   AIMS (if indicated): NA   Assets:    Communication Skills Desire for Improvement Housing Social Support  ADL's:  Intact  Cognition: WNL  Sleep: see above  Appetite: see above    Physical Exam Vitals and nursing note reviewed.  HENT:     Head: Normocephalic and atraumatic.  Eyes:     Conjunctiva/sclera: Conjunctivae normal.  Pulmonary:     Effort: Pulmonary effort is normal. No respiratory distress.  Musculoskeletal:        General: Normal range of motion.  Skin:    General: Skin is warm and dry.  Neurological:     General: No focal deficit present.      Metabolic Disorder Labs: Lab Results  Component Value Date   HGBA1C 5.8 (H) 04/02/2023   No results found for: PROLACTIN Lab Results  Component Value Date   CHOL 198 04/02/2023   TRIG 194 (H) 04/02/2023   HDL 36 (L) 04/02/2023   CHOLHDL 5.5 (H) 04/02/2023   VLDL 29.8 04/24/2015   LDLCALC 127 (H) 04/02/2023   LDLCALC 89 04/24/2015   Lab Results  Component Value Date   TSH 1.750 04/02/2023   TSH 1.06 05/29/2021    Therapeutic Level Labs: No results found for: LITHIUM No results found for: VALPROATE No results found for: CBMZ  Screenings:  GAD-7    Flowsheet Row Clinical Support from 06/22/2024 in [facility] Office Visit from 07/25/2022 in [facility] Video Visit from 01/16/2022 in [facility] Video Visit from 10/22/2021 in [facility] Video Visit from 07/16/2021 in [facility]  Total GAD-7 Score 15 4 2 4 18    PHQ2-9    Flowsheet Row Clinical Support from 06/22/2024 in [facility] Office Visit from 07/25/2022 in [facility] Office Visit from 07/18/2022 in [facility] Dept Of [place]. [facility] Office Visit from 03/27/2022 in [facility] - [facility] Dept Of [place]. [facility] Video Visit from  01/16/2022 in [facility]  PHQ-2 Total Score 0 0 0 0 0  PHQ-9 Total Score -- 6 -- -- 1   Flowsheet Row ED from 05/06/2024 in [facility] Emergency Department at [facility] ED from 04/18/2024 in [facility] Emergency Department at [facility] ED from 09/22/2023 in [facility] Emergency Department at [facility]  C-SSRS RISK CATEGORY No Risk No Risk No Risk     Marlo Masson, MD 12/10/2024, 1:46 PM

## 2024-12-10 NOTE — Patient Instructions (Signed)
 _______________________________________________________  If your blood pressure at your visit was 140/90 or greater, please contact your primary care physician to follow up on this.  _______________________________________________________  If you are age 54 or older, your body mass index should be between 23-30. Your Body mass index is 31.35 kg/m. If this is out of the aforementioned range listed, please consider follow up with your Primary Care Provider.  If you are age 64 or younger, your body mass index should be between 19-25. Your Body mass index is 31.35 kg/m. If this is out of the aformentioned range listed, please consider follow up with your Primary Care Provider.   ________________________________________________________  The [facility] providers would like to encourage you to use MYCHART to communicate with providers for non-urgent requests or questions.  Due to long hold times on the telephone, sending your provider a message by [facility] may be a faster and more efficient way to get a response.  Please allow 48 business hours for a response.  Please remember that this is for non-urgent requests.  _______________________________________________________  [facility] is using a team-based approach to care.  Your team is made up of your doctor and two to three APPS. Our APPS (Nurse Practitioners and Physician Assistants) work with your physician to ensure care continuity for you. They are fully qualified to address your health concerns and develop a treatment plan. They communicate directly with your gastroenterologist to care for you. Seeing the Advanced Practice Practitioners on your physician's team can help you by facilitating care more promptly, often allowing for earlier appointments, access to diagnostic testing, procedures, and other specialty referrals.   HEMORRHOID BANDING PROCEDURE    FOLLOW-UP CARE   The procedure you have had should have been relatively  painless since the banding of the area involved does not have nerve endings and there is no pain sensation.  The rubber band cuts off the blood supply to the hemorrhoid and the band may fall off as soon as 48 hours after the banding (the band may occasionally be seen in the toilet bowl following a bowel movement). You may notice a temporary feeling of fullness in the rectum which should respond adequately to plain Tylenol  or Motrin .  Following the banding, avoid strenuous exercise that evening and resume full activity the next day.  A sitz bath (soaking in a warm tub) or bidet is soothing, and can be useful for cleansing the area after bowel movements.     To avoid constipation, take two tablespoons of natural wheat bran, natural oat bran, flax, Benefiber or any over the counter fiber supplement and increase your water intake to 7-8 glasses daily.    Unless you have been prescribed anorectal medication, do not put anything inside your rectum for two weeks: No suppositories, enemas, fingers, etc.  Occasionally, you may have more bleeding than usual after the banding procedure.  This is often from the untreated hemorrhoids rather than the treated one.  Dont be concerned if there is a tablespoon or so of blood.  If there is more blood than this, lie flat with your bottom higher than your head and apply an ice pack to the area. If the bleeding does not stop within a half an hour or if you feel faint, call our office at [phone] or go to the emergency room.  Problems are not common; however, if there is a substantial amount of bleeding, severe pain, chills, fever or difficulty passing urine (very rare) or other problems, you should  call us  at [phone] or report to the nearest emergency room.  Do not stay seated continuously for more than 2-3 hours for a day or two after the procedure.  Tighten your buttock muscles 10-15 times every two hours and take 10-15 deep breaths every 1-2 hours.  Do  not spend more than a few minutes on the toilet if you cannot empty your bowel; instead re-visit the toilet at a later time.   It was a pleasure to see you today!  Vito Cirigliano, D.O.

## 2024-12-10 NOTE — Progress Notes (Signed)
 "  Chief Complaint:    Symptomatic Internal Hemorrhoids; Hemorrhoid Band Ligation   HPI:     Patient is a 53 y.o. female with a history of symptomatic internal hemorrhoids previously referred to me by Cathryne May, NP and Dr. Charlanne for evaluation and treatment of hemorrhoids which have been unresponsive to maximal medical therapy.   Has had hemorrhoid banding by Dr. Aneita in the past with good response, but earlier this year started having recurrence of rectal irritation and itching with intermittent BRBPR.  Symptoms worse with her underlying IBS-C, treated with Linzess  290 mcg daily and MiraLAX  every other day, along with prunes and hydration.   Last colonoscopy was 07/2020 and notable for ascending colon AVM, hyperplastic polyps, mild diverticulosis, large grade 3 internal hemorrhoids.  Subsequently underwent hemorrhoid banding in the fall 2022 with good response.  Did have recurrence of minor bleeding after completion of banding, treated with preparation suppositories as needed.    Underwent repeat hemorrhoid banding on 06/17/2024 with banding of grade 3 LL hemorrhoid.  Since then, has had overall improvement.  Recent single episode of scant BRBPR that she attributed to straining to help her mother up after a fall.  Otherwise feeling well and presents today for reevaluation and additional banding.   Review of systems:     No chest pain, no SOB, no fevers, no urinary sx   Past Medical History:  Diagnosis Date   Anemia    06/19/21-pt has no recollection of this dx   Anxiety    Anxiety and depression    Asthma    Bipolar 1 disorder (HCC)    Depression    Excessive daytime sleepiness 11/18/2017   GERD (gastroesophageal reflux disease)    Heart murmur    Hemorrhoids    Hiatal hernia    Hypertension    Internal hemorrhoids    Irritable bowel disease 2014   Toe injury 11/18/2017    Patient's surgical history, family medical history, social history, medications and allergies were all  reviewed in Epic    Current Outpatient Medications  Medication Sig Dispense Refill   albuterol  (VENTOLIN  HFA) 108 (90 Base) MCG/ACT inhaler Inhale 2 puffs into the lungs every 6 (six) hours as needed for wheezing or shortness of breath. 3 each 0   Albuterol -Budesonide (AIRSUPRA ) 90-80 MCG/ACT AERO Inhale 2 puffs into the lungs every 4 (four) hours as needed (For coughing and wheezing.). 10.7 g 1   AMBULATORY NON FORMULARY MEDICATION Medication Name: GI Cocktail 45 ml 2% viscous lidocaine 45 ml bentyl  10mg /16ml 135 ml Mylanta SIG 5 ml bid prn gas and stomach pain-do not use more than 3 days in a row 300 mL 2   cetirizine  (ZYRTEC ) 10 MG tablet Take 1 tablet (10 mg total) by mouth daily as needed for allergies (Can take an extra dose during flare ups.). 180 tablet 1   cyclobenzaprine  (FEXMID ) 7.5 MG tablet TAKE 1 TABLET(7.5 MG) BY MOUTH THREE TIMES DAILY AS NEEDED FOR MUSCLE SPASMS 30 tablet 3   diclofenac  (VOLTAREN ) 75 MG EC tablet TAKE 1 TABLET(75 MG) BY MOUTH TWICE DAILY 180 tablet 0   dicyclomine  (BENTYL ) 10 MG capsule Take 1 capsule (10 mg total) by mouth in the morning, at noon, in the evening, and at bedtime. Please keep your upcoming appointment with Dr. Charlanne on 2-27 for any further refills. 120 capsule 0   EPINEPHrine  (EPIPEN  2-PAK) 0.3 mg/0.3 mL IJ SOAJ injection Inject 0.3 mg into the muscle as needed. 2 each 1  EPINEPHrine  (NEFFY ) 2 MG/0.1ML SOLN Place 2 sprays into the nose as needed. 2 each 2   hydrochlorothiazide  (HYDRODIURIL ) 25 MG tablet TAKE 1 TABLET(25 MG) BY MOUTH DAILY 90 tablet 1   hydrOXYzine  (ATARAX ) 50 MG tablet Take 50 mg by mouth 2 (two) times daily.     lamoTRIgine  (LAMICTAL ) 150 MG tablet Take 1 tablet (150 mg total) by mouth daily. 20 tablet 0   linaclotide  (LINZESS ) 290 MCG CAPS capsule Take 1 capsule (290 mcg total) by mouth daily before breakfast. Please call [phone] to schedule an office visit for more refills 90 capsule 0   losartan (COZAAR) 25 MG tablet Take  25 mg by mouth daily.     mirtazapine  (REMERON  SOL-TAB) 30 MG disintegrating tablet Take 1 tablet (30 mg total) by mouth at bedtime. DISSOLVE 1 TABLET(15 MG) ON THE TONGUE AT BEDTIME 90 tablet 0   Olopatadine  HCl (PATADAY ) 0.2 % SOLN Place 1 drop into both eyes 1 day or 1 dose. 7.5 mL 1   ondansetron  (ZOFRAN -ODT) 8 MG disintegrating tablet Take 1 tablet (8 mg total) by mouth every 8 (eight) hours as needed for nausea or vomiting. 30 tablet 0   pantoprazole  (PROTONIX ) 40 MG tablet Take 1 tablet (40 mg total) by mouth 2 (two) times daily. 180 tablet 0   prochlorperazine  (COMPAZINE ) 25 MG suppository Use 1 suppository rectally every 6-8 hours as needed nausea/vomiting 12 suppository 3   propranolol  (INDERAL ) 20 MG tablet Take 1 tablet (20 mg total) by mouth at bedtime. 90 tablet 0   QUEtiapine  (SEROQUEL ) 400 MG tablet Take 1.5 tablets (600 mg total) by mouth at bedtime. 135 tablet 0   traZODone  (DESYREL ) 100 MG tablet Take 1 tablet (100 mg total) by mouth at bedtime as needed for up to 20 days for sleep. 20 tablet 0   varenicline (CHANTIX) 1 MG tablet Take 1 mg by mouth daily.     No current facility-administered medications for this visit.    Physical Exam:     BP 114/80 (BP Location: Right Arm, Patient Position: Sitting, Cuff Size: Normal)   Pulse 84   Ht 5' 6 (1.676 m)   Wt 194 lb 4 oz (88.1 kg)   BMI 31.35 kg/m   GENERAL:  Pleasant female in NAD PSYCH: : Cooperative, normal affect Rectal exam: Sensation intact and preserved anal wink.  Prolapsed, reducible grade 3 hemorrhoid in RP position and grade 2 hemorrhoid in RA position on exam.  No blood on the exam glove. (Chaperone: Nat Sic, CMA).   IMPRESSION and PLAN:    #1.  Symptomatic internal hemorrhoids: PROCEDURE NOTE: The patient presents with symptomatic grade 2-3 hemorrhoids, unresponsive to maximal medical therapy, requesting rubber band ligation of symptomatic hemorrhoidal disease.  All risks, benefits and alternative  forms of therapy were described and informed consent was obtained.  In the Left Lateral Decubitus position, anoscopic examination revealed grade 2-3 hemorrhoids in the RA and RP position(s).  The anorectum was pre-medicated with RectiCare. The decision was made to band the RP internal hemorrhoid, and the [facility] ORegan System was used to perform band ligation without complication.  Digital anorectal examination was then performed to assure proper positioning of the band, and to adjust the banded tissue as required.  The patient was discharged home without pain or other issues.  Dietary and behavioral recommendations were given and along with follow-up instructions.     The following adjunctive treatments were recommended:  -Resume high-fiber diet with fiber supplement (i.e. Citrucel or Benefiber)  with goal for soft stools without straining to have a BM. -Resume adequate fluid intake.  The patient will return in 4+ weeks for follow-up and possible additional banding as required. No complications were encountered and the patient tolerated the procedure well.      #2.  IBS-C - Continue Linzess  - Continue MiraLAX  - Continue prunes, hydration      Sandor LULLA Flatter ,DO, FACG 12/10/2024, 4:03 PM  "

## 2024-12-13 ENCOUNTER — Ambulatory Visit (HOSPITAL_COMMUNITY)

## 2024-12-13 ENCOUNTER — Other Ambulatory Visit (HOSPITAL_COMMUNITY)

## 2024-12-13 DIAGNOSIS — F411 Generalized anxiety disorder: Secondary | ICD-10-CM

## 2024-12-13 DIAGNOSIS — F200 Paranoid schizophrenia: Secondary | ICD-10-CM | POA: Diagnosis not present

## 2024-12-13 NOTE — Addendum Note (Signed)
 Addended by: CARRION CARRERO, MARLO on: 12/13/2024 02:50 PM   Modules accepted: Orders

## 2024-12-13 NOTE — Progress Notes (Signed)
 Patient presented to the office for an EKG, was preformed with no issue and pt will follow up with provider with results

## 2024-12-28 ENCOUNTER — Ambulatory Visit: Admitting: Allergy and Immunology

## 2024-12-28 ENCOUNTER — Other Ambulatory Visit: Payer: Self-pay

## 2024-12-28 VITALS — BP 120/74 | HR 73 | Temp 98.0°F | Resp 18 | Ht 66.0 in | Wt 194.0 lb

## 2024-12-28 DIAGNOSIS — J301 Allergic rhinitis due to pollen: Secondary | ICD-10-CM | POA: Diagnosis not present

## 2024-12-28 DIAGNOSIS — H1010 Acute atopic conjunctivitis, unspecified eye: Secondary | ICD-10-CM | POA: Diagnosis not present

## 2024-12-28 DIAGNOSIS — F17210 Nicotine dependence, cigarettes, uncomplicated: Secondary | ICD-10-CM | POA: Diagnosis not present

## 2024-12-28 DIAGNOSIS — T7819XD Other adverse food reactions, not elsewhere classified, subsequent encounter: Secondary | ICD-10-CM

## 2024-12-28 DIAGNOSIS — J4520 Mild intermittent asthma, uncomplicated: Secondary | ICD-10-CM

## 2024-12-28 DIAGNOSIS — T7819XA Other adverse food reactions, not elsewhere classified, initial encounter: Secondary | ICD-10-CM

## 2024-12-28 DIAGNOSIS — F40298 Other specified phobia: Secondary | ICD-10-CM

## 2024-12-28 MED ORDER — NEFFY 2 MG/0.1ML NA SOLN: 2.0000 | NASAL | 2 refills | Status: AC | PRN

## 2024-12-28 MED ORDER — AIRSUPRA 90-80 MCG/ACT IN AERO: 2.0000 | INHALATION_SPRAY | RESPIRATORY_TRACT | 1 refills | Status: AC | PRN

## 2024-12-28 MED ORDER — OLOPATADINE HCL 0.2 % OP SOLN: 1.0000 [drp] | Freq: Every day | OPHTHALMIC | 0 refills | Status: AC

## 2024-12-28 MED ORDER — CETIRIZINE HCL 10 MG PO TABS: 10.0000 mg | ORAL_TABLET | Freq: Every day | ORAL | 1 refills | Status: AC | PRN

## 2024-12-28 NOTE — Progress Notes (Unsigned)
 "  [place] [facility] [place] [facility] [place] [facility] [place] [facility] [place]   Follow-up Note  Referring Provider: No ref. provider found Primary Provider: Pura Lenis, MD Date of Office Visit: 12/28/2024  Subjective:   Jasmine Jackson (DOB: June 07, 1971) is a 54 y.o. female who [facility] in re-evaluation of the following:  HPI: Jasmine Jackson returns to this clinic in evaluation of asthma, allergic rhinitis, oral allergy syndrome, and tobacco smoking.  I last saw her in this clinic during her initial evaluation of 22 June 2024.  She did visit with Dr. Tobie 03 September 2024 for a respiratory tract flareup.  She has really done well without any significant issues revolving around her respiratory tract and with rare requirement for short acting bronchodilator and no limitation on ability to exercise and able to have cold air exposure with no difficulty and no need for systemic steroid.  And she has done very well with her nose and her eyes while using cetirizine .  She remains away from banana consumption.  We attempted to get her intranasal epinephrine  during her last visit but that was denied.  She has a very severe needle phobia and does not think that she can use an EpiPen .  She has decreased her smoking from 20 cigarettes/day to 15 cigarettes/day.  Allergies as of 12/28/2024       Reactions   Asa [aspirin ] Other (See Comments)   Heart flutters   Banana Itching        Medication List    Airsupra  90-80 MCG/ACT Aero Generic drug: Albuterol -Budesonide Inhale 2 puffs into the lungs every 4 (four) hours as needed (For coughing and wheezing.).   albuterol  108 (90 Base) MCG/ACT inhaler Commonly known as: VENTOLIN  HFA Inhale 2 puffs into the lungs every 6 (six) hours as needed for wheezing or shortness of breath.   cetirizine  10 MG tablet Commonly known as: ZYRTEC  Take 1 tablet (10 mg total) by mouth daily as needed for allergies (Can take an extra dose  during flare ups.).   cyclobenzaprine  7.5 MG tablet Commonly known as: FEXMID  TAKE 1 TABLET(7.5 MG) BY MOUTH THREE TIMES DAILY AS NEEDED FOR MUSCLE SPASMS   diclofenac  75 MG EC tablet Commonly known as: VOLTAREN  TAKE 1 TABLET(75 MG) BY MOUTH TWICE DAILY   dicyclomine  10 MG capsule Commonly known as: BENTYL  Take 1 capsule (10 mg total) by mouth in the morning, at noon, in the evening, and at bedtime. Please keep your upcoming appointment with Dr. Charlanne on 2-27 for any further refills.   hydrochlorothiazide  25 MG tablet Commonly known as: HYDRODIURIL  TAKE 1 TABLET(25 MG) BY MOUTH DAILY   hydrOXYzine  50 MG tablet Commonly known as: ATARAX  Take 50 mg by mouth 2 (two) times daily.   lamoTRIgine  150 MG tablet Commonly known as: LaMICtal  Take 1 tablet (150 mg total) by mouth daily.   linaclotide  290 MCG Caps capsule Commonly known as: Linzess  Take 1 capsule (290 mcg total) by mouth daily before breakfast. Please call [phone] to schedule an office visit for more refills   losartan 25 MG tablet Commonly known as: COZAAR Take 25 mg by mouth daily.   mirtazapine  30 MG disintegrating tablet Commonly known as: REMERON  SOL-TAB Take 1 tablet (30 mg total) by mouth at bedtime. DISSOLVE 1 TABLET(15 MG) ON THE TONGUE AT BEDTIME   EPINEPHrine  0.3 mg/0.3 mL Soaj injection Commonly known as: EpiPen  2-Pak Inject 0.3 mg into the muscle as needed.   ondansetron  8 MG disintegrating  tablet Commonly known as: ZOFRAN -ODT Take 1 tablet (8 mg total) by mouth every 8 (eight) hours as needed for nausea or vomiting.   pantoprazole  40 MG tablet Commonly known as: PROTONIX  Take 1 tablet (40 mg total) by mouth 2 (two) times daily.   prochlorperazine  25 MG suppository Commonly known as: COMPAZINE  Use 1 suppository rectally every 6-8 hours as needed nausea/vomiting   propranolol  20 MG tablet Commonly known as: INDERAL  Take 1 tablet (20 mg total) by mouth at bedtime.   QUEtiapine  400 MG  tablet Commonly known as: SEROquel  Take 1.5 tablets (600 mg total) by mouth at bedtime.   traZODone  100 MG tablet Commonly known as: DESYREL  Take 1 tablet (100 mg total) by mouth at bedtime as needed for up to 20 days for sleep.    Past Medical History:  Diagnosis Date   Anemia    06/19/21-pt has no recollection of this dx   Anxiety    Anxiety and depression    Asthma    Bipolar 1 disorder (HCC)    Depression    Excessive daytime sleepiness 11/18/2017   GERD (gastroesophageal reflux disease)    Heart murmur    Hemorrhoids    Hiatal hernia    Hypertension    Internal hemorrhoids    Irritable bowel disease 2014   Toe injury 11/18/2017    Past Surgical History:  Procedure Laterality Date   ADENOIDECTOMY     COLONOSCOPY  2021   HEMORRHOID SURGERY     INGUINAL HERNIA REPAIR     PARTIAL HYSTERECTOMY     fibroids   TONSILLECTOMY     UPPER GASTROINTESTINAL ENDOSCOPY      Review of systems negative except as noted in HPI / PMHx or noted below:  Review of Systems  Constitutional: Negative.   HENT: Negative.    Eyes: Negative.   Respiratory: Negative.    Cardiovascular: Negative.   Gastrointestinal: Negative.   Genitourinary: Negative.   Musculoskeletal: Negative.   Skin: Negative.   Neurological: Negative.   Endo/Heme/Allergies: Negative.   Psychiatric/Behavioral: Negative.       Objective:   Vitals:   12/28/24 1548  BP: 120/74  Pulse: 73  Resp: 18  Temp: 98 F (36.7 C)  SpO2: 99%   Height: 5' 6 (167.6 cm)  Weight: 194 lb (88 kg)   Physical Exam Constitutional:      Appearance: She is not diaphoretic.  HENT:     Head: Normocephalic.     Right Ear: Tympanic membrane, ear canal and external ear normal.     Left Ear: Tympanic membrane, ear canal and external ear normal.     Nose: Nose normal. No mucosal edema or rhinorrhea.     Mouth/Throat:     Pharynx: Uvula midline. No oropharyngeal exudate.  Eyes:     Conjunctiva/sclera: Conjunctivae normal.   Neck:     Thyroid : No thyromegaly.     Trachea: Trachea normal. No tracheal tenderness or tracheal deviation.  Cardiovascular:     Rate and Rhythm: Normal rate and regular rhythm.     Heart sounds: Normal heart sounds, S1 normal and S2 normal. No murmur heard. Pulmonary:     Effort: No respiratory distress.     Breath sounds: Normal breath sounds. No stridor. No wheezing or rales.  Lymphadenopathy:     Head:     Right side of head: No tonsillar adenopathy.     Left side of head: No tonsillar adenopathy.     Cervical: No cervical adenopathy.  Skin:    Findings: No erythema or rash.     Nails: There is no clubbing.  Neurological:     Mental Status: She is alert.     Diagnostics:Spirometry was performed and demonstrated an FEV1 of 2.28 at 93 % of predicted.  Assessment and Plan:   1. Asthma, mild intermittent, well-controlled   2. Seasonal allergic rhinitis due to pollen   3. Seasonal allergic conjunctivitis   4. Pollen-food allergy, subsequent encounter   5. Tobacco smoker, less than 10 cigarettes per day   6. Needle phobia       1. Allergen avoidance measures - pollens, banana  2. Use nicotine  substitutes to replace tobacco smoke exposure  3. If needed:   A. Airsupra  - 2 inhalations every 4-6 hours (coupon)  B. Cetirizine  10 mg - 1 tablet 1-2 times per day  C. Pataday  - 1 drop each eye 1 time per day  D. Neffy , benadryl , MD/ER evaluation for allergic reaction (NEEDLE PHOBIA)  4. Return to clinic in 6 months or earlier if problem  5. Influenza = Tamiflu. Covid = Paxlovid  Jasmine Jackson appears to be doing pretty well regarding her respiratory tract while intermittently using some medications directed against asthma and her rhinitis.  Some of her benefit probably comes in the face of decreasing her tobacco use by approximately 50% or so.  We will attempt to get her intranasal epinephrine  as she has a very severe needle phobia and cannot use an injectable epinephrine  for her  oral allergy syndrome.  If she does well we will see her back in clinic in 6 months or earlier if there is a problem.  Jasmine Denis, MD Allergy / Immunology [facility] "

## 2024-12-28 NOTE — Patient Instructions (Addendum)
"   ° °  1. Allergen avoidance measures - pollens, banana  2. Use nicotine  substitutes to replace tobacco smoke exposure  3. If needed:   A. Airsupra  - 2 inhalations every 4-6 hours (coupon)  B. Cetirizine  10 mg - 1 tablet 1-2 times per day  C. Pataday  - 1 drop each eye 1 time per day  D. Neffy , benadryl , MD/ER evaluation for allergic reaction (NEEDLE PHOBIA)  4. Return to clinic in 6 months or earlier if problem  5. Influenza = Tamiflu. Covid = Paxlovid "

## 2024-12-29 ENCOUNTER — Telehealth: Payer: Self-pay | Admitting: *Deleted

## 2024-12-29 ENCOUNTER — Other Ambulatory Visit: Payer: Self-pay | Admitting: *Deleted

## 2024-12-29 ENCOUNTER — Other Ambulatory Visit (HOSPITAL_COMMUNITY): Payer: Self-pay

## 2024-12-29 ENCOUNTER — Telehealth: Payer: Self-pay

## 2024-12-29 ENCOUNTER — Encounter: Payer: Self-pay | Admitting: Allergy and Immunology

## 2024-12-29 NOTE — Telephone Encounter (Signed)
-----   Message from Camellia Denis, MD sent at 12/29/2024  6:43 AM EST ----- Needs Neffy .  Requires PA.  Has severe needle phobia.

## 2024-12-29 NOTE — Telephone Encounter (Signed)
 Per Neffy  rep:   For [employer]ink: If Neffy  isnt covered or the covered Neffy  is more than $199 (high-deductible insurance) the patient will be offered the cash pay price of $199/box regardless.  If a prior authorization is required per [facility], instruct the patient to go to [email or web address] or scan the QR code.  You or your staff will not need to complete the PA.  The patient will do a brief consultation with a virtual prescriber, and the PA will be done for the patient.  -sent to office for patient to go to [email or web address] to apply and [facility] will proceed with PA if eligible.

## 2024-12-29 NOTE — Telephone Encounter (Signed)
 Spoke with patient--DOB verified--informed her of the information from our PA team to get neffy  approved. Verbalized understanding. No further questions or concerns.

## 2024-12-29 NOTE — Telephone Encounter (Signed)
Prescription was sent in yesterday.

## 2025-01-04 ENCOUNTER — Telehealth (HOSPITAL_COMMUNITY): Payer: Self-pay

## 2025-01-04 NOTE — Telephone Encounter (Signed)
 Patient called in requesting communication from provider. Per patient her PCP believes she needs to decrease the amount medications she is taking for her mental health. Next appoint is 02/03/25.

## 2025-01-06 ENCOUNTER — Ambulatory Visit: Admitting: Podiatry

## 2025-01-06 NOTE — Telephone Encounter (Signed)
 Contacted and spoke with the patient at her mobile number.  She reports that has been having issues with her memory for the past 7 years, which she had not disclosed until now.  She has been advised by her PCP to review her psychotropic medications with me at follow-up.  The symptoms she is reporting are chronic, and the patient has no acute concerns.  In the meantime, she is amenable to continuing all of her medications until her appointment on 02/03/2025.

## 2025-01-10 ENCOUNTER — Ambulatory Visit: Admitting: Dermatology

## 2025-01-14 ENCOUNTER — Ambulatory Visit: Admitting: Podiatry

## 2025-01-14 ENCOUNTER — Encounter: Payer: Self-pay | Admitting: Podiatry

## 2025-01-14 DIAGNOSIS — L600 Ingrowing nail: Secondary | ICD-10-CM | POA: Diagnosis not present

## 2025-01-14 NOTE — Patient Instructions (Signed)

## 2025-01-14 NOTE — Progress Notes (Signed)
 Patient complains of painful ingrown medial border hallux bilaterally.  Right 1 is bothering her the most and has been most painful.  She says the medial border on the left bothers her intermittently for several years.  Has not noticed any purulence. Patient denies fevers, chills, nausea, vomiting.  Objective:  Vitals: Reviewed  General: Well developed, nourished, in no acute distress, alert and oriented x3   Vascular: DP pulse 2/4 bilateral. PT pulse 2/4 bilateral.  Mild edema toe with ingrown nail.  Capillary refill time immediate bilaterally  Dermatology: Erythema, edema, incurvated nail border medial border hallux bilaterally with no drainage . Tenderness present with palpation. Normal skin tone and texture feet with normal hair growth.  Neurological: Grossly intact. Normal reflexes.   Musculoskeletal: Tenderness with palpation of the distal hallux bilaterally. No tenderness or painful ROM at IPJ.  Diagnosis: 1.  Ingrown nail medial border hallux bilaterally  Plan: -discussed etiology and treatment of ingrown nails. Discussed surgical vs conservative treatment.  The right nail is bothering the most so we will do that today and then see her for postop visit and do the left foot at that time. -Consent signed for appropriate matrixectomy affected nail(s).   Procedure(s):   - Matrixectomy(s) medial border hallux right: Toe anesthetized with 3cc 2:1 mixture 2% Lidocaine with epinephrine : Sodium Bicarbonate. Surgical site prepped. Digital tourniquet applied.  Avulsion of nail border. performed. Matrixecomy performed with three 30 second applications of phenol to nail matrix. Site irrigated with alcohol.  Tourniquet released with good vascularity noticed in digit.  Applied triple antibiotic to nailbed and applied gauze and Coban dressing. - Written and oral postoperative instructions given.  -Return for post-op 2 weeks and matrixectomy medial border hallux left  J Prentice Binder, DPM

## 2025-01-15 ENCOUNTER — Encounter (HOSPITAL_BASED_OUTPATIENT_CLINIC_OR_DEPARTMENT_OTHER): Payer: Self-pay | Admitting: Emergency Medicine

## 2025-01-15 ENCOUNTER — Emergency Department (HOSPITAL_BASED_OUTPATIENT_CLINIC_OR_DEPARTMENT_OTHER)
Admission: EM | Admit: 2025-01-15 | Discharge: 2025-01-15 | Disposition: A | Discharge status: Home / Self Care | Attending: Emergency Medicine | Admitting: Emergency Medicine

## 2025-01-15 DIAGNOSIS — Z5189 Encounter for other specified aftercare: Secondary | ICD-10-CM | POA: Diagnosis present

## 2025-01-15 NOTE — ED Provider Notes (Signed)
 " [place] EMERGENCY DEPARTMENT AT [facility] Provider Note   CSN: [id number] Arrival date & time: 01/15/25  1723     Patient presents with: Toe Pain   RESHANDA LEWEY is a 54 y.o. female.   Patient presents to the emergency department today for evaluation of wound.  She had a wedge resection done of the right great toenail yesterday for an ingrown toenail.  She was concerned about the way the wound looked today.  Upon arrival to the emergency department, she was found to have a stuck piece of gauze which is what caused her concerns.  No increasing pain, redness or swelling.  No purulent drainage.  Patient initiating wound care prescribed by podiatry today.       Prior to Admission medications  Medication Sig Start Date End Date Taking? Authorizing Provider  albuterol  (VENTOLIN  HFA) 108 (90 Base) MCG/ACT inhaler Inhale 2 puffs into the lungs every 6 (six) hours as needed for wheezing or shortness of breath. 09/07/21   Barbaraann Katz, MD  Albuterol -Budesonide (AIRSUPRA ) 90-80 MCG/ACT AERO Inhale 2 puffs into the lungs every 4 (four) hours as needed (For coughing and wheezing.). 12/28/24   Kozlow, Camellia PARAS, MD  AMBULATORY NON FORMULARY MEDICATION Medication Name: GI Cocktail 45 ml 2% viscous lidocaine 45 ml bentyl  10mg /46ml 135 ml Mylanta SIG 5 ml bid prn gas and stomach pain-do not use more than 3 days in a row 06/10/24   May, Deanna J, NP  cetirizine  (ZYRTEC ) 10 MG tablet Take 1 tablet (10 mg total) by mouth daily as needed for allergies (Can take an extra dose during flare ups.). 12/28/24   Kozlow, Camellia PARAS, MD  cyclobenzaprine  (FEXMID ) 7.5 MG tablet TAKE 1 TABLET(7.5 MG) BY MOUTH THREE TIMES DAILY AS NEEDED FOR MUSCLE SPASMS 11/01/22   Sharl Gee, MD  diclofenac  (VOLTAREN ) 75 MG EC tablet TAKE 1 TABLET(75 MG) BY MOUTH TWICE DAILY 05/18/24   Silva Juliene JONELLE, DPM  dicyclomine  (BENTYL ) 10 MG capsule Take 1 capsule (10 mg total) by mouth in the morning, at noon, in the evening, and at  bedtime. Please keep your upcoming appointment with Dr. Charlanne on 2-27 for any further refills. 01/14/24   Charlanne Groom, MD  EPINEPHrine  (EPIPEN  2-PAK) 0.3 mg/0.3 mL IJ SOAJ injection Inject 0.3 mg into the muscle as needed. 07/05/24   Kozlow, Camellia PARAS, MD  EPINEPHrine  (NEFFY ) 2 MG/0.1ML SOLN Place 2 sprays into the nose as needed. 12/28/24   Kozlow, Camellia PARAS, MD  hydrochlorothiazide  (HYDRODIURIL ) 25 MG tablet TAKE 1 TABLET(25 MG) BY MOUTH DAILY 01/16/23   Sharl Gee, MD  hydrOXYzine  (ATARAX ) 50 MG tablet Take 50 mg by mouth 2 (two) times daily. 10/19/24   [provider]  lamoTRIgine  (LAMICTAL ) 150 MG tablet Take 1 tablet (150 mg total) by mouth daily. 12/10/24   Carrion-Carrero, Marlo, MD  linaclotide  (LINZESS ) 290 MCG CAPS capsule Take 1 capsule (290 mcg total) by mouth daily before breakfast. Please call [phone] to schedule an office visit for more refills 05/19/24   Charlanne Groom, MD  losartan (COZAAR) 25 MG tablet Take 25 mg by mouth daily. 06/10/24   [provider]  mirtazapine  (REMERON  SOL-TAB) 30 MG disintegrating tablet Take 1 tablet (30 mg total) by mouth at bedtime. DISSOLVE 1 TABLET(15 MG) ON THE TONGUE AT BEDTIME 12/10/24 03/10/25  Carrion-Carrero, Marlo, MD  Olopatadine  HCl 0.2 % SOLN Apply 1 drop to eye daily. Apply 1 drop to each eye once a day 12/28/24   Kozlow, Camellia PARAS,  MD  ondansetron  (ZOFRAN -ODT) 8 MG disintegrating tablet Take 1 tablet (8 mg total) by mouth every 8 (eight) hours as needed for nausea or vomiting. 03/24/24   Charlanne Groom, MD  pantoprazole  (PROTONIX ) 40 MG tablet Take 1 tablet (40 mg total) by mouth 2 (two) times daily. 04/02/24   Charlanne Groom, MD  prochlorperazine  (COMPAZINE ) 25 MG suppository Use 1 suppository rectally every 6-8 hours as needed nausea/vomiting 03/24/24   Charlanne Groom, MD  propranolol  (INDERAL ) 20 MG tablet Take 1 tablet (20 mg total) by mouth at bedtime. 09/24/24   Carrion-Carrero, Marlo, MD  QUEtiapine  (SEROQUEL ) 400 MG tablet Take  1.5 tablets (600 mg total) by mouth at bedtime. 12/10/24 03/10/25  Carrion-Carrero, Marlo, MD  traZODone  (DESYREL ) 100 MG tablet Take 1 tablet (100 mg total) by mouth at bedtime as needed for up to 20 days for sleep. 11/26/24 01/14/25  Carrion-Carrero, Marlo, MD    Allergies: Dorethia Familia ] and Banana    Review of Systems  Updated Vital Signs BP 121/69 (BP Location: Right Arm)   Pulse 66   Temp 97.7 F (36.5 C)   Resp 17   SpO2 100%   Physical Exam Vitals and nursing note reviewed.  Constitutional:      Appearance: She is well-developed.  HENT:     Head: Normocephalic and atraumatic.  Eyes:     Conjunctiva/sclera: Conjunctivae normal.  Pulmonary:     Effort: No respiratory distress.  Musculoskeletal:     Cervical back: Normal range of motion and neck supple.     Comments: Right foot: There is a wedge removed from the toenail.  This appears to be appropriately healing.  No sign of infection.  Skin:    General: Skin is warm and dry.  Neurological:     Mental Status: She is alert.     (all labs ordered are listed, but only abnormal results are displayed) Labs Reviewed - No data to display  EKG: None  Radiology: No results found.   Procedures   Medications Ordered in the ED - No data to display  ED Course  Patient seen and examined. History obtained directly from patient.     Labs/EKG: None ordered  Imaging: None ordered  Medications/Fluids: None ordered  Most recent vital signs reviewed and are as follows: BP 121/69 (BP Location: Right Arm)   Pulse 66   Temp 97.7 F (36.5 C)   Resp 17   SpO2 100%   Initial impression: Encounter for wound check, no concerns  Home treatment plan: Continue soaks and postprocedural wound care  Return instructions discussed with patient: Return with worsening pain, redness, swelling, purulent drainage.  Follow-up instructions discussed with patient: Podiatry as planned                                   Medical  Decision Making  Patient here for wound recheck.  She was concerned about the appearance of the wound.  There was a piece of gauze that was attached that she was not aware of.  After removal, wound appears to be healing appropriately, no complications at this time.  Patient reassured.     Final diagnoses:  Visit for wound check    ED Discharge Orders     None          Desiderio Chew, [place] 01/15/25 1925  "

## 2025-01-15 NOTE — Discharge Instructions (Signed)
 Your wound appears to be appropriately healing.  Please do wound care and soaks as recommended by your podiatrist.  I do not see any signs of infection or complication at this time.  Return with any worsening redness, pain, swelling, pus drainage from the wound.

## 2025-01-15 NOTE — ED Triage Notes (Signed)
 Ingrown toe nail procedure yesterday Today concerned when removing bandage that incision in widening Right great toe

## 2025-01-25 ENCOUNTER — Ambulatory Visit: Admitting: Podiatry

## 2025-01-28 ENCOUNTER — Ambulatory Visit: Admitting: Podiatry

## 2025-02-03 ENCOUNTER — Telehealth (HOSPITAL_COMMUNITY): Admitting: Student in an Organized Health Care Education/Training Program

## 2025-02-07 ENCOUNTER — Ambulatory Visit: Admitting: Podiatry

## 2025-02-10 ENCOUNTER — Encounter: Admitting: Gastroenterology

## 2025-06-28 ENCOUNTER — Ambulatory Visit: Admitting: Allergy and Immunology
